# Patient Record
Sex: Female | Born: 1935 | Race: White | Hispanic: No | Smoking: Never smoker
Health system: Southern US, Community
[De-identification: ages and names within clinical notes are randomized; demographics above are authoritative.]

## PROBLEM LIST (undated history)

## (undated) DIAGNOSIS — F419 Anxiety disorder, unspecified: Secondary | ICD-10-CM

## (undated) DIAGNOSIS — I10 Essential (primary) hypertension: Secondary | ICD-10-CM

## (undated) DIAGNOSIS — M1990 Unspecified osteoarthritis, unspecified site: Secondary | ICD-10-CM

## (undated) DIAGNOSIS — J45909 Unspecified asthma, uncomplicated: Secondary | ICD-10-CM

## (undated) DIAGNOSIS — J449 Chronic obstructive pulmonary disease, unspecified: Secondary | ICD-10-CM

## (undated) DIAGNOSIS — K279 Peptic ulcer, site unspecified, unspecified as acute or chronic, without hemorrhage or perforation: Secondary | ICD-10-CM

## (undated) DIAGNOSIS — C73 Malignant neoplasm of thyroid gland: Secondary | ICD-10-CM

## (undated) DIAGNOSIS — E785 Hyperlipidemia, unspecified: Secondary | ICD-10-CM

## (undated) DIAGNOSIS — E7800 Pure hypercholesterolemia, unspecified: Secondary | ICD-10-CM

## (undated) DIAGNOSIS — K219 Gastro-esophageal reflux disease without esophagitis: Secondary | ICD-10-CM

## (undated) DIAGNOSIS — C50919 Malignant neoplasm of unspecified site of unspecified female breast: Secondary | ICD-10-CM

## (undated) DIAGNOSIS — C801 Malignant (primary) neoplasm, unspecified: Secondary | ICD-10-CM

## (undated) DIAGNOSIS — Z85828 Personal history of other malignant neoplasm of skin: Secondary | ICD-10-CM

## (undated) DIAGNOSIS — I7789 Other specified disorders of arteries and arterioles: Secondary | ICD-10-CM

## (undated) HISTORY — PX: MASTECTOMY: SHX3

## (undated) HISTORY — PX: STOMACH SURGERY: SHX791

## (undated) HISTORY — DX: Pure hypercholesterolemia, unspecified: E78.00

## (undated) HISTORY — PX: REPLACEMENT TOTAL KNEE BILATERAL: SUR1225

## (undated) HISTORY — DX: Unspecified osteoarthritis, unspecified site: M19.90

## (undated) HISTORY — PX: THYROIDECTOMY: SHX17

## (undated) HISTORY — PX: BREAST SURGERY: SHX581

## (undated) HISTORY — PX: JOINT REPLACEMENT: SHX530

## (undated) HISTORY — PX: APPENDECTOMY: SHX54

## (undated) HISTORY — PX: FRACTURE SURGERY: SHX138

## (undated) HISTORY — PX: CHOLECYSTECTOMY: SHX55

## (undated) HISTORY — DX: Malignant neoplasm of thyroid gland: C73

## (undated) HISTORY — PX: REPAIR OF PERFORATED ULCER: SHX6065

---

## 2007-11-16 DIAGNOSIS — E785 Hyperlipidemia, unspecified: Secondary | ICD-10-CM | POA: Insufficient documentation

## 2009-02-24 DIAGNOSIS — J45909 Unspecified asthma, uncomplicated: Secondary | ICD-10-CM | POA: Insufficient documentation

## 2009-02-24 DIAGNOSIS — J479 Bronchiectasis, uncomplicated: Secondary | ICD-10-CM | POA: Insufficient documentation

## 2013-02-03 DIAGNOSIS — N200 Calculus of kidney: Secondary | ICD-10-CM | POA: Insufficient documentation

## 2013-03-23 DIAGNOSIS — M1710 Unilateral primary osteoarthritis, unspecified knee: Secondary | ICD-10-CM | POA: Insufficient documentation

## 2013-04-01 DIAGNOSIS — E210 Primary hyperparathyroidism: Secondary | ICD-10-CM | POA: Insufficient documentation

## 2013-11-26 DIAGNOSIS — C73 Malignant neoplasm of thyroid gland: Secondary | ICD-10-CM | POA: Insufficient documentation

## 2013-12-15 DIAGNOSIS — E890 Postprocedural hypothyroidism: Secondary | ICD-10-CM | POA: Insufficient documentation

## 2015-01-20 DIAGNOSIS — G5601 Carpal tunnel syndrome, right upper limb: Secondary | ICD-10-CM | POA: Diagnosis not present

## 2015-01-20 DIAGNOSIS — G5602 Carpal tunnel syndrome, left upper limb: Secondary | ICD-10-CM | POA: Diagnosis not present

## 2015-01-25 DIAGNOSIS — E890 Postprocedural hypothyroidism: Secondary | ICD-10-CM | POA: Diagnosis not present

## 2015-01-25 DIAGNOSIS — C73 Malignant neoplasm of thyroid gland: Secondary | ICD-10-CM | POA: Diagnosis not present

## 2015-01-27 DIAGNOSIS — C73 Malignant neoplasm of thyroid gland: Secondary | ICD-10-CM | POA: Diagnosis not present

## 2015-01-27 DIAGNOSIS — E890 Postprocedural hypothyroidism: Secondary | ICD-10-CM | POA: Diagnosis not present

## 2015-02-10 DIAGNOSIS — G5602 Carpal tunnel syndrome, left upper limb: Secondary | ICD-10-CM | POA: Diagnosis not present

## 2015-04-25 DIAGNOSIS — E0789 Other specified disorders of thyroid: Secondary | ICD-10-CM | POA: Diagnosis not present

## 2015-04-25 DIAGNOSIS — E890 Postprocedural hypothyroidism: Secondary | ICD-10-CM | POA: Diagnosis not present

## 2015-04-25 DIAGNOSIS — C73 Malignant neoplasm of thyroid gland: Secondary | ICD-10-CM | POA: Diagnosis not present

## 2015-05-22 DIAGNOSIS — E890 Postprocedural hypothyroidism: Secondary | ICD-10-CM | POA: Diagnosis not present

## 2015-06-09 DIAGNOSIS — R6889 Other general symptoms and signs: Secondary | ICD-10-CM | POA: Diagnosis not present

## 2015-06-09 DIAGNOSIS — N1330 Unspecified hydronephrosis: Secondary | ICD-10-CM | POA: Diagnosis not present

## 2015-06-22 DIAGNOSIS — N1330 Unspecified hydronephrosis: Secondary | ICD-10-CM | POA: Diagnosis not present

## 2015-06-22 DIAGNOSIS — N281 Cyst of kidney, acquired: Secondary | ICD-10-CM | POA: Diagnosis not present

## 2015-06-22 DIAGNOSIS — R1909 Other intra-abdominal and pelvic swelling, mass and lump: Secondary | ICD-10-CM | POA: Diagnosis not present

## 2015-06-26 DIAGNOSIS — R6889 Other general symptoms and signs: Secondary | ICD-10-CM | POA: Diagnosis not present

## 2015-06-26 DIAGNOSIS — N1330 Unspecified hydronephrosis: Secondary | ICD-10-CM | POA: Diagnosis not present

## 2015-07-06 DIAGNOSIS — N200 Calculus of kidney: Secondary | ICD-10-CM | POA: Diagnosis not present

## 2015-07-14 DIAGNOSIS — Z23 Encounter for immunization: Secondary | ICD-10-CM | POA: Diagnosis not present

## 2015-07-31 DIAGNOSIS — C73 Malignant neoplasm of thyroid gland: Secondary | ICD-10-CM | POA: Diagnosis not present

## 2015-07-31 DIAGNOSIS — N200 Calculus of kidney: Secondary | ICD-10-CM | POA: Diagnosis not present

## 2015-07-31 DIAGNOSIS — J9601 Acute respiratory failure with hypoxia: Secondary | ICD-10-CM | POA: Diagnosis not present

## 2015-08-04 DIAGNOSIS — E890 Postprocedural hypothyroidism: Secondary | ICD-10-CM | POA: Diagnosis present

## 2015-08-04 DIAGNOSIS — R0602 Shortness of breath: Secondary | ICD-10-CM | POA: Diagnosis not present

## 2015-08-04 DIAGNOSIS — E860 Dehydration: Secondary | ICD-10-CM | POA: Diagnosis present

## 2015-08-04 DIAGNOSIS — Z7982 Long term (current) use of aspirin: Secondary | ICD-10-CM | POA: Diagnosis not present

## 2015-08-04 DIAGNOSIS — Z66 Do not resuscitate: Secondary | ICD-10-CM | POA: Diagnosis present

## 2015-08-04 DIAGNOSIS — J45909 Unspecified asthma, uncomplicated: Secondary | ICD-10-CM | POA: Diagnosis present

## 2015-08-04 DIAGNOSIS — A419 Sepsis, unspecified organism: Secondary | ICD-10-CM | POA: Diagnosis not present

## 2015-08-04 DIAGNOSIS — E871 Hypo-osmolality and hyponatremia: Secondary | ICD-10-CM | POA: Diagnosis not present

## 2015-08-04 DIAGNOSIS — F419 Anxiety disorder, unspecified: Secondary | ICD-10-CM | POA: Diagnosis present

## 2015-08-04 DIAGNOSIS — Z9010 Acquired absence of unspecified breast and nipple: Secondary | ICD-10-CM | POA: Diagnosis not present

## 2015-08-04 DIAGNOSIS — Z85850 Personal history of malignant neoplasm of thyroid: Secondary | ICD-10-CM | POA: Diagnosis not present

## 2015-08-04 DIAGNOSIS — Z853 Personal history of malignant neoplasm of breast: Secondary | ICD-10-CM | POA: Diagnosis not present

## 2015-08-04 DIAGNOSIS — J189 Pneumonia, unspecified organism: Secondary | ICD-10-CM | POA: Diagnosis not present

## 2015-08-04 DIAGNOSIS — I10 Essential (primary) hypertension: Secondary | ICD-10-CM | POA: Diagnosis not present

## 2015-08-04 DIAGNOSIS — J9601 Acute respiratory failure with hypoxia: Secondary | ICD-10-CM | POA: Diagnosis not present

## 2015-08-14 DIAGNOSIS — J189 Pneumonia, unspecified organism: Secondary | ICD-10-CM | POA: Diagnosis not present

## 2015-08-28 DIAGNOSIS — Z09 Encounter for follow-up examination after completed treatment for conditions other than malignant neoplasm: Secondary | ICD-10-CM | POA: Diagnosis not present

## 2015-08-28 DIAGNOSIS — I700 Atherosclerosis of aorta: Secondary | ICD-10-CM | POA: Diagnosis not present

## 2015-08-28 DIAGNOSIS — R918 Other nonspecific abnormal finding of lung field: Secondary | ICD-10-CM | POA: Diagnosis not present

## 2015-08-28 DIAGNOSIS — J189 Pneumonia, unspecified organism: Secondary | ICD-10-CM | POA: Diagnosis not present

## 2015-10-24 DIAGNOSIS — F411 Generalized anxiety disorder: Secondary | ICD-10-CM | POA: Insufficient documentation

## 2016-02-26 DIAGNOSIS — E890 Postprocedural hypothyroidism: Secondary | ICD-10-CM | POA: Diagnosis not present

## 2016-03-13 DIAGNOSIS — Z08 Encounter for follow-up examination after completed treatment for malignant neoplasm: Secondary | ICD-10-CM | POA: Diagnosis not present

## 2016-03-13 DIAGNOSIS — Z85850 Personal history of malignant neoplasm of thyroid: Secondary | ICD-10-CM | POA: Diagnosis not present

## 2016-03-15 DIAGNOSIS — Z85850 Personal history of malignant neoplasm of thyroid: Secondary | ICD-10-CM | POA: Diagnosis not present

## 2016-03-15 DIAGNOSIS — Z08 Encounter for follow-up examination after completed treatment for malignant neoplasm: Secondary | ICD-10-CM | POA: Diagnosis not present

## 2016-05-14 DIAGNOSIS — R6889 Other general symptoms and signs: Secondary | ICD-10-CM | POA: Diagnosis not present

## 2016-05-14 DIAGNOSIS — E785 Hyperlipidemia, unspecified: Secondary | ICD-10-CM | POA: Diagnosis not present

## 2016-05-15 DIAGNOSIS — J189 Pneumonia, unspecified organism: Secondary | ICD-10-CM | POA: Diagnosis not present

## 2016-05-15 DIAGNOSIS — I77810 Thoracic aortic ectasia: Secondary | ICD-10-CM | POA: Insufficient documentation

## 2016-05-17 DIAGNOSIS — J189 Pneumonia, unspecified organism: Secondary | ICD-10-CM | POA: Diagnosis not present

## 2016-05-20 DIAGNOSIS — J189 Pneumonia, unspecified organism: Secondary | ICD-10-CM | POA: Diagnosis not present

## 2016-05-24 DIAGNOSIS — J189 Pneumonia, unspecified organism: Secondary | ICD-10-CM | POA: Diagnosis not present

## 2016-06-04 DIAGNOSIS — M7989 Other specified soft tissue disorders: Secondary | ICD-10-CM | POA: Insufficient documentation

## 2016-06-04 DIAGNOSIS — R2230 Localized swelling, mass and lump, unspecified upper limb: Secondary | ICD-10-CM | POA: Insufficient documentation

## 2016-06-17 DIAGNOSIS — I4891 Unspecified atrial fibrillation: Secondary | ICD-10-CM | POA: Diagnosis not present

## 2016-06-17 DIAGNOSIS — I340 Nonrheumatic mitral (valve) insufficiency: Secondary | ICD-10-CM | POA: Diagnosis not present

## 2016-06-17 DIAGNOSIS — I714 Abdominal aortic aneurysm, without rupture: Secondary | ICD-10-CM | POA: Diagnosis not present

## 2016-06-17 DIAGNOSIS — R000 Tachycardia, unspecified: Secondary | ICD-10-CM | POA: Diagnosis not present

## 2016-07-01 DIAGNOSIS — J479 Bronchiectasis, uncomplicated: Secondary | ICD-10-CM | POA: Diagnosis not present

## 2016-07-01 DIAGNOSIS — M47819 Spondylosis without myelopathy or radiculopathy, site unspecified: Secondary | ICD-10-CM | POA: Diagnosis not present

## 2016-07-01 DIAGNOSIS — J4530 Mild persistent asthma, uncomplicated: Secondary | ICD-10-CM | POA: Diagnosis not present

## 2016-07-01 DIAGNOSIS — J189 Pneumonia, unspecified organism: Secondary | ICD-10-CM | POA: Diagnosis not present

## 2016-07-01 DIAGNOSIS — R05 Cough: Secondary | ICD-10-CM | POA: Diagnosis not present

## 2016-07-01 DIAGNOSIS — M19011 Primary osteoarthritis, right shoulder: Secondary | ICD-10-CM | POA: Diagnosis not present

## 2016-07-01 DIAGNOSIS — I77810 Thoracic aortic ectasia: Secondary | ICD-10-CM | POA: Diagnosis not present

## 2016-07-01 DIAGNOSIS — J9811 Atelectasis: Secondary | ICD-10-CM | POA: Diagnosis not present

## 2016-07-01 DIAGNOSIS — R911 Solitary pulmonary nodule: Secondary | ICD-10-CM | POA: Diagnosis not present

## 2016-07-01 DIAGNOSIS — M19012 Primary osteoarthritis, left shoulder: Secondary | ICD-10-CM | POA: Diagnosis not present

## 2016-07-01 DIAGNOSIS — R918 Other nonspecific abnormal finding of lung field: Secondary | ICD-10-CM | POA: Diagnosis not present

## 2016-07-02 DIAGNOSIS — R319 Hematuria, unspecified: Secondary | ICD-10-CM | POA: Insufficient documentation

## 2016-07-02 DIAGNOSIS — I10 Essential (primary) hypertension: Secondary | ICD-10-CM | POA: Diagnosis not present

## 2016-07-02 DIAGNOSIS — R350 Frequency of micturition: Secondary | ICD-10-CM | POA: Diagnosis not present

## 2016-07-15 DIAGNOSIS — M7989 Other specified soft tissue disorders: Secondary | ICD-10-CM | POA: Diagnosis not present

## 2016-07-22 DIAGNOSIS — M65842 Other synovitis and tenosynovitis, left hand: Secondary | ICD-10-CM | POA: Diagnosis not present

## 2016-08-09 DIAGNOSIS — Z23 Encounter for immunization: Secondary | ICD-10-CM | POA: Diagnosis not present

## 2016-08-20 DIAGNOSIS — C73 Malignant neoplasm of thyroid gland: Secondary | ICD-10-CM | POA: Diagnosis not present

## 2016-08-20 DIAGNOSIS — E890 Postprocedural hypothyroidism: Secondary | ICD-10-CM | POA: Diagnosis not present

## 2016-08-28 DIAGNOSIS — C7989 Secondary malignant neoplasm of other specified sites: Secondary | ICD-10-CM | POA: Insufficient documentation

## 2016-08-29 DIAGNOSIS — C770 Secondary and unspecified malignant neoplasm of lymph nodes of head, face and neck: Secondary | ICD-10-CM | POA: Diagnosis not present

## 2016-08-29 DIAGNOSIS — C73 Malignant neoplasm of thyroid gland: Secondary | ICD-10-CM | POA: Diagnosis not present

## 2016-08-29 DIAGNOSIS — E890 Postprocedural hypothyroidism: Secondary | ICD-10-CM | POA: Diagnosis not present

## 2016-08-29 DIAGNOSIS — E210 Primary hyperparathyroidism: Secondary | ICD-10-CM | POA: Diagnosis not present

## 2016-09-23 DIAGNOSIS — R6889 Other general symptoms and signs: Secondary | ICD-10-CM | POA: Diagnosis not present

## 2016-09-24 DIAGNOSIS — M810 Age-related osteoporosis without current pathological fracture: Secondary | ICD-10-CM | POA: Insufficient documentation

## 2016-10-07 DIAGNOSIS — M47814 Spondylosis without myelopathy or radiculopathy, thoracic region: Secondary | ICD-10-CM | POA: Diagnosis not present

## 2016-10-07 DIAGNOSIS — R911 Solitary pulmonary nodule: Secondary | ICD-10-CM | POA: Diagnosis not present

## 2016-10-07 DIAGNOSIS — I259 Chronic ischemic heart disease, unspecified: Secondary | ICD-10-CM | POA: Diagnosis not present

## 2016-10-07 DIAGNOSIS — I517 Cardiomegaly: Secondary | ICD-10-CM | POA: Diagnosis not present

## 2016-10-07 DIAGNOSIS — J189 Pneumonia, unspecified organism: Secondary | ICD-10-CM | POA: Diagnosis not present

## 2016-10-07 DIAGNOSIS — J479 Bronchiectasis, uncomplicated: Secondary | ICD-10-CM | POA: Diagnosis not present

## 2016-10-07 DIAGNOSIS — R918 Other nonspecific abnormal finding of lung field: Secondary | ICD-10-CM | POA: Diagnosis not present

## 2016-10-07 DIAGNOSIS — I712 Thoracic aortic aneurysm, without rupture: Secondary | ICD-10-CM | POA: Diagnosis not present

## 2016-10-07 DIAGNOSIS — N200 Calculus of kidney: Secondary | ICD-10-CM | POA: Diagnosis not present

## 2016-10-11 DIAGNOSIS — J479 Bronchiectasis, uncomplicated: Secondary | ICD-10-CM | POA: Diagnosis not present

## 2016-10-11 DIAGNOSIS — J4530 Mild persistent asthma, uncomplicated: Secondary | ICD-10-CM | POA: Diagnosis not present

## 2016-10-11 DIAGNOSIS — R918 Other nonspecific abnormal finding of lung field: Secondary | ICD-10-CM | POA: Diagnosis not present

## 2016-11-05 LAB — TSH: TSH: 0.5 u[IU]/mL (ref <=5.90)

## 2016-11-06 DIAGNOSIS — C73 Malignant neoplasm of thyroid gland: Secondary | ICD-10-CM | POA: Diagnosis not present

## 2016-11-11 DIAGNOSIS — R300 Dysuria: Secondary | ICD-10-CM | POA: Diagnosis not present

## 2016-11-22 ENCOUNTER — Emergency Department: Payer: Medicare Other

## 2016-11-22 ENCOUNTER — Inpatient Hospital Stay
Admission: EM | Admit: 2016-11-22 | Discharge: 2016-11-24 | DRG: 690 | Disposition: A | Discharge status: Home Health | Payer: Medicare Other | Attending: Internal Medicine | Admitting: Internal Medicine

## 2016-11-22 ENCOUNTER — Encounter: Payer: Self-pay | Admitting: Emergency Medicine

## 2016-11-22 DIAGNOSIS — E860 Dehydration: Secondary | ICD-10-CM | POA: Diagnosis present

## 2016-11-22 DIAGNOSIS — R0602 Shortness of breath: Secondary | ICD-10-CM | POA: Diagnosis not present

## 2016-11-22 DIAGNOSIS — J449 Chronic obstructive pulmonary disease, unspecified: Secondary | ICD-10-CM | POA: Diagnosis present

## 2016-11-22 DIAGNOSIS — I10 Essential (primary) hypertension: Secondary | ICD-10-CM | POA: Diagnosis present

## 2016-11-22 DIAGNOSIS — B9620 Unspecified Escherichia coli [E. coli] as the cause of diseases classified elsewhere: Secondary | ICD-10-CM | POA: Diagnosis present

## 2016-11-22 DIAGNOSIS — R262 Difficulty in walking, not elsewhere classified: Secondary | ICD-10-CM

## 2016-11-22 DIAGNOSIS — Z8249 Family history of ischemic heart disease and other diseases of the circulatory system: Secondary | ICD-10-CM | POA: Diagnosis not present

## 2016-11-22 DIAGNOSIS — E785 Hyperlipidemia, unspecified: Secondary | ICD-10-CM | POA: Diagnosis present

## 2016-11-22 DIAGNOSIS — E890 Postprocedural hypothyroidism: Secondary | ICD-10-CM | POA: Diagnosis present

## 2016-11-22 DIAGNOSIS — E871 Hypo-osmolality and hyponatremia: Secondary | ICD-10-CM | POA: Diagnosis not present

## 2016-11-22 DIAGNOSIS — J181 Lobar pneumonia, unspecified organism: Secondary | ICD-10-CM

## 2016-11-22 DIAGNOSIS — J189 Pneumonia, unspecified organism: Secondary | ICD-10-CM

## 2016-11-22 DIAGNOSIS — R319 Hematuria, unspecified: Secondary | ICD-10-CM

## 2016-11-22 DIAGNOSIS — M6281 Muscle weakness (generalized): Secondary | ICD-10-CM

## 2016-11-22 DIAGNOSIS — N390 Urinary tract infection, site not specified: Principal | ICD-10-CM | POA: Diagnosis present

## 2016-11-22 DIAGNOSIS — R6510 Systemic inflammatory response syndrome (SIRS) of non-infectious origin without acute organ dysfunction: Secondary | ICD-10-CM

## 2016-11-22 DIAGNOSIS — Z85828 Personal history of other malignant neoplasm of skin: Secondary | ICD-10-CM

## 2016-11-22 DIAGNOSIS — Z853 Personal history of malignant neoplasm of breast: Secondary | ICD-10-CM | POA: Diagnosis not present

## 2016-11-22 DIAGNOSIS — R531 Weakness: Secondary | ICD-10-CM | POA: Diagnosis not present

## 2016-11-22 HISTORY — DX: Peptic ulcer, site unspecified, unspecified as acute or chronic, without hemorrhage or perforation: K27.9

## 2016-11-22 HISTORY — DX: Malignant neoplasm of unspecified site of unspecified female breast: C50.919

## 2016-11-22 HISTORY — DX: Anxiety disorder, unspecified: F41.9

## 2016-11-22 HISTORY — DX: Malignant (primary) neoplasm, unspecified: C80.1

## 2016-11-22 HISTORY — DX: Essential (primary) hypertension: I10

## 2016-11-22 HISTORY — DX: Hyperlipidemia, unspecified: E78.5

## 2016-11-22 HISTORY — DX: Chronic obstructive pulmonary disease, unspecified: J44.9

## 2016-11-22 HISTORY — DX: Other specified disorders of arteries and arterioles: I77.89

## 2016-11-22 HISTORY — DX: Unspecified asthma, uncomplicated: J45.909

## 2016-11-22 HISTORY — DX: Gastro-esophageal reflux disease without esophagitis: K21.9

## 2016-11-22 HISTORY — DX: Personal history of other malignant neoplasm of skin: Z85.828

## 2016-11-22 LAB — CBC
HCT: 38.3 % (ref 35.0–47.0)
Hemoglobin: 13.1 g/dL (ref 12.0–16.0)
MCH: 29.1 pg (ref 26.0–34.0)
MCHC: 34.2 g/dL (ref 32.0–36.0)
MCV: 85.2 fL (ref 80.0–100.0)
PLATELETS: 218 10*3/uL (ref 150–440)
RBC: 4.49 MIL/uL (ref 3.80–5.20)
RDW: 13.6 % (ref 11.5–14.5)
WBC: 11.4 10*3/uL — ABNORMAL HIGH (ref 3.6–11.0)

## 2016-11-22 LAB — BASIC METABOLIC PANEL
Anion gap: 11 (ref 5–15)
BUN: 12 mg/dL (ref 6–20)
CHLORIDE: 82 mmol/L — AB (ref 101–111)
CO2: 26 mmol/L (ref 22–32)
CREATININE: 0.45 mg/dL (ref 0.44–1.00)
Calcium: 8.4 mg/dL — ABNORMAL LOW (ref 8.9–10.3)
GFR calc Af Amer: >60 mL/min (ref >60)
GFR calc non Af Amer: >60 mL/min (ref >60)
GLUCOSE: 99 mg/dL (ref 65–99)
Potassium: 4.7 mmol/L (ref 3.5–5.1)
Sodium: 119 mmol/L — CL (ref 135–145)

## 2016-11-22 LAB — URINALYSIS, COMPLETE (UACMP) WITH MICROSCOPIC
BILIRUBIN URINE: NEGATIVE
Glucose, UA: NEGATIVE mg/dL
Ketones, ur: NEGATIVE mg/dL
Nitrite: POSITIVE — AB
PROTEIN: 100 mg/dL — AB
SPECIFIC GRAVITY, URINE: 1.012 (ref 1.005–1.030)
pH: 8 (ref 5.0–8.0)

## 2016-11-22 LAB — INFLUENZA PANEL BY PCR (TYPE A & B)
Influenza A By PCR: NEGATIVE
Influenza B By PCR: NEGATIVE

## 2016-11-22 LAB — LACTIC ACID, PLASMA
LACTIC ACID, VENOUS: 1.1 mmol/L (ref 0.5–1.9)
LACTIC ACID, VENOUS: 1.1 mmol/L (ref 0.5–1.9)

## 2016-11-22 MED ORDER — LEVOTHYROXINE SODIUM 50 MCG PO TABS
150.0000 ug | ORAL_TABLET | Freq: Every day | ORAL | Status: DC
  Administered 2016-11-23 – 2016-11-24 (×2): 150 ug via ORAL
  Filled 2016-11-22 (×2): qty 3

## 2016-11-22 MED ORDER — DEXTROSE 5 % IV SOLN: 1.0000 g | INTRAVENOUS | Status: DC

## 2016-11-22 MED ORDER — SODIUM CHLORIDE 0.9 % IV SOLN
INTRAVENOUS | Status: DC
  Administered 2016-11-22 – 2016-11-24 (×4): via INTRAVENOUS

## 2016-11-22 MED ORDER — ENOXAPARIN SODIUM 40 MG/0.4ML ~~LOC~~ SOLN
40.0000 mg | SUBCUTANEOUS | Status: DC
  Administered 2016-11-22 – 2016-11-23 (×2): 40 mg via SUBCUTANEOUS
  Filled 2016-11-22 (×2): qty 0.4

## 2016-11-22 MED ORDER — ENALAPRIL MALEATE 20 MG PO TABS
20.0000 mg | ORAL_TABLET | Freq: Every day | ORAL | Status: DC
  Administered 2016-11-23 – 2016-11-24 (×2): 20 mg via ORAL
  Filled 2016-11-22 (×2): qty 1

## 2016-11-22 MED ORDER — ASPIRIN EC 81 MG PO TBEC
81.0000 mg | DELAYED_RELEASE_TABLET | Freq: Every day | ORAL | Status: DC
  Administered 2016-11-22 – 2016-11-24 (×3): 81 mg via ORAL
  Filled 2016-11-22 (×4): qty 1

## 2016-11-22 MED ORDER — SERTRALINE HCL 50 MG PO TABS
50.0000 mg | ORAL_TABLET | Freq: Every day | ORAL | Status: DC
  Administered 2016-11-23 – 2016-11-24 (×2): 50 mg via ORAL
  Filled 2016-11-22 (×2): qty 1

## 2016-11-22 MED ORDER — ONDANSETRON HCL 4 MG PO TABS: 4.0000 mg | ORAL_TABLET | Freq: Four times a day (QID) | ORAL | Status: DC | PRN

## 2016-11-22 MED ORDER — LORAZEPAM 0.5 MG PO TABS
0.2500 mg | ORAL_TABLET | ORAL | Status: DC | PRN
  Administered 2016-11-22 – 2016-11-23 (×4): 0.25 mg via ORAL
  Filled 2016-11-22 (×4): qty 1

## 2016-11-22 MED ORDER — CEFTRIAXONE SODIUM-DEXTROSE 1-3.74 GM-% IV SOLR
1.0000 g | INTRAVENOUS | Status: DC
  Administered 2016-11-23: 1 g via INTRAVENOUS
  Filled 2016-11-22: qty 50

## 2016-11-22 MED ORDER — OXYBUTYNIN CHLORIDE 5 MG PO TABS
5.0000 mg | ORAL_TABLET | Freq: Three times a day (TID) | ORAL | Status: DC
  Filled 2016-11-22 (×3): qty 1

## 2016-11-22 MED ORDER — SODIUM CHLORIDE 0.9 % IV BOLUS (SEPSIS)
1000.0000 mL | Freq: Once | INTRAVENOUS | Status: AC
  Administered 2016-11-22: 1000 mL via INTRAVENOUS

## 2016-11-22 MED ORDER — CEFTRIAXONE SODIUM-DEXTROSE 1-3.74 GM-% IV SOLR
1.0000 g | Freq: Once | INTRAVENOUS | Status: AC
  Administered 2016-11-22: 1 g via INTRAVENOUS
  Filled 2016-11-22: qty 50

## 2016-11-22 MED ORDER — ACETAMINOPHEN 650 MG RE SUPP: 650.0000 mg | Freq: Four times a day (QID) | RECTAL | Status: DC | PRN

## 2016-11-22 MED ORDER — DEXTROSE 5 % IV SOLN: 1.0000 g | Freq: Once | INTRAVENOUS | Status: DC

## 2016-11-22 MED ORDER — ACETAMINOPHEN 325 MG PO TABS
650.0000 mg | ORAL_TABLET | Freq: Four times a day (QID) | ORAL | Status: DC | PRN
  Administered 2016-11-22: 650 mg via ORAL

## 2016-11-22 MED ORDER — GUAIFENESIN ER 600 MG PO TB12
600.0000 mg | ORAL_TABLET | Freq: Two times a day (BID) | ORAL | Status: DC
  Administered 2016-11-22 – 2016-11-24 (×4): 600 mg via ORAL
  Filled 2016-11-22 (×4): qty 1

## 2016-11-22 MED ORDER — RISAQUAD PO CAPS
1.0000 | ORAL_CAPSULE | Freq: Every day | ORAL | Status: DC
  Administered 2016-11-23 – 2016-11-24 (×2): 1 via ORAL
  Filled 2016-11-22 (×2): qty 1

## 2016-11-22 MED ORDER — PRAVASTATIN SODIUM 10 MG PO TABS
10.0000 mg | ORAL_TABLET | Freq: Every day | ORAL | Status: DC
  Administered 2016-11-22 – 2016-11-23 (×2): 10 mg via ORAL
  Filled 2016-11-22 (×2): qty 1

## 2016-11-22 MED ORDER — ACETAMINOPHEN 325 MG PO TABS
650.0000 mg | ORAL_TABLET | Freq: Four times a day (QID) | ORAL | Status: DC | PRN
  Filled 2016-11-22: qty 2

## 2016-11-22 MED ORDER — AZITHROMYCIN 500 MG IV SOLR
500.0000 mg | INTRAVENOUS | Status: DC
  Filled 2016-11-22: qty 500

## 2016-11-22 MED ORDER — CALCIUM CARBONATE ANTACID 500 MG PO CHEW
1.0000 | CHEWABLE_TABLET | Freq: Every day | ORAL | Status: DC
  Administered 2016-11-22 – 2016-11-24 (×3): 200 mg via ORAL
  Filled 2016-11-22 (×4): qty 1

## 2016-11-22 MED ORDER — DEXTROSE 5 % IV SOLN
500.0000 mg | Freq: Once | INTRAVENOUS | Status: AC
  Administered 2016-11-22: 500 mg via INTRAVENOUS
  Filled 2016-11-22: qty 500

## 2016-11-22 MED ORDER — VITAMIN D 1000 UNITS PO TABS
2000.0000 [IU] | ORAL_TABLET | Freq: Every day | ORAL | Status: DC
  Administered 2016-11-22 – 2016-11-23 (×2): 2000 [IU] via ORAL
  Filled 2016-11-22 (×2): qty 2

## 2016-11-22 MED ORDER — ONDANSETRON HCL 4 MG/2ML IJ SOLN: 4.0000 mg | Freq: Four times a day (QID) | INTRAMUSCULAR | Status: DC | PRN

## 2016-11-22 MED ORDER — ADULT MULTIVITAMIN W/MINERALS CH
1.0000 | ORAL_TABLET | Freq: Every day | ORAL | Status: DC
  Administered 2016-11-23 – 2016-11-24 (×2): 1 via ORAL
  Filled 2016-11-22 (×2): qty 1

## 2016-11-22 MED ORDER — MOMETASONE FURO-FORMOTEROL FUM 200-5 MCG/ACT IN AERO
2.0000 | INHALATION_SPRAY | Freq: Two times a day (BID) | RESPIRATORY_TRACT | Status: DC
  Administered 2016-11-22 – 2016-11-24 (×4): 2 via RESPIRATORY_TRACT
  Filled 2016-11-22: qty 8.8

## 2016-11-22 NOTE — ED Triage Notes (Signed)
Weak, not eating or drinking for about 3 days.  Just came from [place] several days ago.  Patient appears sob. Says she has had a cough, and hx bronchitis.

## 2016-11-22 NOTE — Plan of Care (Signed)
Received via stretcher. Transferred to bed without complications. VSS. Denies pain. NS infusing at 72ml/hr. Family at bedside.

## 2016-11-22 NOTE — ED Provider Notes (Signed)
[facility] Emergency Department Provider Note ____________________________________________   I have reviewed the triage vital signs and the triage nursing note.  HISTORY  Chief Complaint Weakness   Historian Patient and her daughter  HPI Joyce [name] is a 83 y.o. female history of COPD, from [place] who came in on Sunday to visit her daughter here in [place]. Since she woke up on Monday morning she has had a lot of generalized weakness. No confusion or altered mental status. No fever. Baseline shortness of breath, but nothing worse than normal and she has a history of COPD. States that she's had echocardiogram and was told they would just repeated in about a year. No diagnosis of CHF and does not think her legs are swollen.  States that she recently completed antibiotic treatment for urinary tract infection but does not know what antibiotic it was.  She states that she has been told that she had low sodium in the past and was seen in the emergency room, but never admitted for it.  No chest pain.    Past Medical History:  Diagnosis Date  . Asthma   . Cancer ([place])   . COPD (chronic obstructive pulmonary disease) ([place])   . Enlarged aorta ([place])   . Hypertension     There are no active problems to display for this patient.   Past Surgical History:  Procedure Laterality Date  . APPENDECTOMY    . BREAST SURGERY    . CHOLECYSTECTOMY    . FRACTURE SURGERY    . JOINT REPLACEMENT    . MASTECTOMY    . STOMACH SURGERY    . THYROIDECTOMY      Prior to Admission medications   Not on File    No Known Allergies  No family history on file.  Social History Social History  Substance Use Topics  . Smoking status: Never Smoker  . Smokeless tobacco: Never Used  . Alcohol use No    Review of Systems  Constitutional: Negative for fever. Eyes: Negative for visual changes. ENT: Negative for sore throat. Cardiovascular: Negative for chest  pain. Respiratory: Chronic shortness breath without any new shortness of breath or wheezing or trouble breathing. Gastrointestinal: Negative for abdominal pain, vomiting and diarrhea. Genitourinary: Negative for dysuria. Musculoskeletal: Negative for back pain. Skin: Negative for rash. Neurological: Negative for headache. 10 point Review of Systems otherwise negative ____________________________________________   PHYSICAL EXAM:  VITAL SIGNS: ED Triage Vitals [11/22/16 1051]  Enc Vitals Group     BP 129/74     Pulse Rate (!) 104     Resp (!) 21     Temp 97.7 F (36.5 C)     Temp Source Oral     SpO2 91 %     Weight 135 lb (61.2 kg)     Height 5' (1.524 m)     Head Circumference      Peak Flow      Pain Score      Pain Loc      Pain Edu?      Excl. in [place]?      Constitutional: Alert and oriented. In no acute distress. HEENT   Head: Normocephalic and atraumatic.      Eyes: Conjunctivae are normal. PERRL. Normal extraocular movements.      Ears:         Nose: No congestion/rhinnorhea.   Mouth/Throat: Mucous membranes are moderately dry.   Neck: No stridor. Cardiovascular/Chest: Slightly tachycardic, regular rhythm.  No  murmurs, rubs, or gallops. Respiratory: Normal respiratory effort without tachypnea nor retractions. Mild rhonchi symmetrically, left base. Gastrointestinal: Soft. No distention, no guarding, no rebound. Nontender.   Genitourinary/rectal:Deferred Musculoskeletal: Nontender with normal range of motion in all extremities. No joint effusions.  No lower extremity tenderness. 1-2+ lower extremity pitting edema bilaterally. Neurologic:  Normal speech and language. No gross or focal neurologic deficits are appreciated. Skin:  Skin is warm, dry and intact. No rash noted. Psychiatric: Mood and affect are normal. Speech and behavior are normal. Patient exhibits appropriate insight and judgment.   ____________________________________________  LABS  (pertinent positives/negatives)  Labs Reviewed  BASIC METABOLIC PANEL - Abnormal; Notable for the following:       Result Value   Sodium 119 (*)    Chloride 82 (*)    Calcium 8.4 (*)    All other components within normal limits  CBC - Abnormal; Notable for the following:    WBC 11.4 (*)    All other components within normal limits  URINALYSIS, COMPLETE (UACMP) WITH MICROSCOPIC - Abnormal; Notable for the following:    Color, Urine YELLOW (*)    APPearance CLOUDY (*)    Hgb urine dipstick MODERATE (*)    Protein, ur 100 (*)    Nitrite POSITIVE (*)    Leukocytes, UA LARGE (*)    Bacteria, UA MANY (*)    Squamous Epithelial / LPF 0-5 (*)    All other components within normal limits  CULTURE, BLOOD (ROUTINE X 2)  CULTURE, BLOOD (ROUTINE X 2)  URINE CULTURE  LACTIC ACID, PLASMA  LACTIC ACID, PLASMA  CBG MONITORING, ED    ____________________________________________    EKG I, Lisa Roca, MD, the attending physician have personally viewed and interpreted all ECGs.  102 bpm. Sinus tachycardia. Narrow QRS. Normal axis. Nonspecific ST and T-wave ____________________________________________  RADIOLOGY All Xrays were viewed by me. Imaging interpreted by Radiologist.  Chest portable:  IMPRESSION: 1. Left lower lobe infiltrate. 2. Aortic atherosclerosis. __________________________________________  PROCEDURES  Procedure(s) performed: None  Critical Care performed: None  ____________________________________________   ED COURSE / ASSESSMENT AND PLAN  Pertinent labs & imaging results that were available during my care of the patient were reviewed by me and considered in my medical decision making (see chart for details).   I spoke with the triage nurse regarding critical sodium 119, and I reviewed laboratory studies and saw also nitrite positive UTI associated with tachycardia. I placed sepsis protocols. I added blood work for blood cultures, urine culture, and lactate.  I initiated antibiotics Rocephin for urinary tract infection.  She does not know what she was recently on. Added on chest x-ray due to the low oxygen at 91%, and rhonchi at the left base, chest x-ray read as left lower lobe infiltrate. Added azithromycin.  Patient will need to be admitted for hyponatremia, SIRS and UTI, also mild hypoxia - unclear if this is worse from baseline or not, but treating for left lower lobe pneumonia.  In terms of the hyponatremia, sound like she has had this in the past, but she doesn't know what number. She doesn't think she's ever been diagnosed with congestive heart failure, however she does have some 2+ lower extremity pain or edema bilateral lower extremities.  I'm starting initially with fluid bolus for her severe hyponatremia, but and watching carefully due to concern about possible CHF.    CONSULTATIONS:   Hospitalist for admission.   Patient / Family / Caregiver informed of clinical course, medical decision-making process,  and agree with plan.  ___________________________________________   FINAL CLINICAL IMPRESSION(S) / ED DIAGNOSES   Final diagnoses:  Hyponatremia  SIRS (systemic inflammatory response syndrome) (HCC)  Urinary tract infection with hematuria, site unspecified  Pneumonia of left lower lobe due to infectious organism [facility])              Note: This dictation was prepared with Dragon dictation. Any transcriptional errors that result from this process are unintentional    Lisa Roca, MD 11/22/16 1312

## 2016-11-22 NOTE — H&P (Signed)
[facility] [place] NAME: Joyce [name]    MR#:  [id number]  DATE OF BIRTH:  30-Mar-1928  DATE OF ADMISSION:  11/22/2016  PRIMARY CARE PHYSICIAN: No primary care provider on file.   REQUESTING/REFERRING PHYSICIAN: Lisa Roca MD  CHIEF COMPLAINT:   Chief Complaint  Patient presents with  . Weakness    HISTORY OF PRESENT ILLNESS: Latesha Keel  is a 83 y.o. female with a known history of Anxiety asthma, history of breast cancer, COPD, essential hypertension and hyperlipidemia who was recently treated with antibiotics for urinary tract infection is presenting with generalized weakness and deconditioning. Patient was evaluated in the ED is noted to have hyponatremia is noted to have a urinary tract infection as well as pneumonia. Patient's reports that she just has not felt like wanted to eat for the past 1 week. Has loss of appetite but no nausea vomiting or diarrhea. Denies any fevers chills no body aches    PAST MEDICAL HISTORY:   Past Medical History:  Diagnosis Date  . Anxiety   . Asthma   . Breast cancer ([place])   . Cancer ([place])   . COPD (chronic obstructive pulmonary disease) ([place])   . Enlarged aorta ([place])   . GERD (gastroesophageal reflux disease)   . Hx of skin cancer, basal cell   . Hyperlipemia   . Hypertension   . PUD (peptic ulcer disease)     PAST SURGICAL HISTORY: Past Surgical History:  Procedure Laterality Date  . APPENDECTOMY    . BREAST SURGERY    . CHOLECYSTECTOMY    . FRACTURE SURGERY    . JOINT REPLACEMENT    . MASTECTOMY    . STOMACH SURGERY    . THYROIDECTOMY      SOCIAL HISTORY:  Social History  Substance Use Topics  . Smoking status: Never Smoker  . Smokeless tobacco: Never Used  . Alcohol use No    FAMILY HISTORY:  Family History  Problem Relation Age of Onset  . Hypertension Father     DRUG ALLERGIES: No Known Allergies  REVIEW OF SYSTEMS:   CONSTITUTIONAL: No fever, Positive fatigue  and  weakness.  EYES: No blurred or double vision.  EARS, NOSE, AND THROAT: No tinnitus or ear pain.  RESPIRATORY: No cough, shortness of breath, wheezing or hemoptysis.  CARDIOVASCULAR: No chest pain, orthopnea, edema.  GASTROINTESTINAL: No nausea, vomiting, diarrhea or abdominal pain.  GENITOURINARY: No dysuria, hematuria.  ENDOCRINE: No polyuria, nocturia,  HEMATOLOGY: No anemia, easy bruising or bleeding SKIN: No rash or lesion. MUSCULOSKELETAL: No joint pain or arthritis.   NEUROLOGIC: No tingling, numbness, weakness.  PSYCHIATRY: No anxiety or depression.   MEDICATIONS AT HOME:  Prior to Admission medications   Medication Sig Start Date End Date Taking? Authorizing Provider  acetaminophen (TYLENOL) 325 MG tablet Take 650 mg by mouth every 6 (six) hours as needed.   Yes Historical Provider, MD  acidophilus (RISAQUAD) CAPS capsule Take 1 capsule by mouth daily.   Yes Historical Provider, MD  aspirin EC 81 MG tablet Take 81 mg by mouth daily.   Yes Historical Provider, MD  calcium carbonate (TUMS - DOSED IN MG ELEMENTAL CALCIUM) 500 MG chewable tablet Chew 1 tablet by mouth daily.   Yes Historical Provider, MD  cholecalciferol (VITAMIN D) 1000 units tablet Take 2,000 Units by mouth daily.   Yes Historical Provider, MD  enalapril (VASOTEC) 20 MG tablet Take 20 mg by mouth daily.   Yes Historical  Provider, MD  Fluticasone-Salmeterol (ADVAIR) 500-50 MCG/DOSE AEPB Inhale 1 puff into the lungs 2 (two) times daily.   Yes Historical Provider, MD  levothyroxine (SYNTHROID, LEVOTHROID) 150 MCG tablet Take 150 mcg by mouth daily before breakfast.   Yes Historical Provider, MD  LORazepam (ATIVAN) 0.5 MG tablet Take 0.25 mg by mouth every 4 (four) hours as needed for anxiety.   Yes Historical Provider, MD  lovastatin (MEVACOR) 40 MG tablet Take 40 mg by mouth at bedtime.   Yes Historical Provider, MD  MAGNESIUM-ZINC PO Take 1 tablet by mouth daily.   Yes Historical Provider, MD  Multiple Vitamin  (MULTIVITAMIN WITH MINERALS) TABS tablet Take 1 tablet by mouth daily.   Yes Historical Provider, MD  oxybutynin (DITROPAN) 5 MG tablet Take 5 mg by mouth 3 (three) times daily.   Yes Historical Provider, MD  sertraline (ZOLOFT) 50 MG tablet Take 50 mg by mouth daily.   Yes Historical Provider, MD      PHYSICAL EXAMINATION:   VITAL SIGNS: Blood pressure 129/74, pulse (!) 104, temperature 97.7 F (36.5 C), temperature source Oral, resp. rate (!) 21, height 5' (1.524 m), weight 135 lb (61.2 kg), SpO2 91 %.  GENERAL:  83 y.o.-year-old patient lying in the bed with no acute distress. Appears weak EYES: Pupils equal, round, reactive to light and accommodation. No scleral icterus. Extraocular muscles intact.  HEENT: Head atraumatic, normocephalic. Oropharynx dry and nasopharynx clear.  NECK:  Supple, no jugular venous distention. No thyroid enlargement, no tenderness.  LUNGS: Normal breath sounds bilaterally, no wheezing, rales,rhonchi or crepitation. No use of accessory muscles of respiration.  CARDIOVASCULAR: S1, S2 normal. No murmurs, rubs, or gallops.  ABDOMEN: Soft, nontender, nondistended. Bowel sounds present. No organomegaly or mass.  EXTREMITIES: No pedal edema, cyanosis, or clubbing.  NEUROLOGIC: Cranial nerves II through XII are intact. Muscle strength 5/5 in all extremities. Sensation intact. Gait not checked.  PSYCHIATRIC: The patient is alert and oriented x 3.  SKIN: No obvious rash, lesion, or ulcer.   LABORATORY PANEL:   CBC  Recent Labs Lab 11/22/16 1102  WBC 11.4*  HGB 13.1  HCT 38.3  PLT 218  MCV 85.2  MCH 29.1  MCHC 34.2  RDW 13.6   ------------------------------------------------------------------------------------------------------------------  Chemistries   Recent Labs Lab 11/22/16 1102  NA 119*  K 4.7  CL 82*  CO2 26  GLUCOSE 99  BUN 12  CREATININE 0.45  CALCIUM 8.4*    ------------------------------------------------------------------------------------------------------------------ estimated creatinine clearance is 41.3 mL/min (by C-G formula based on SCr of 0.45 mg/dL). ------------------------------------------------------------------------------------------------------------------ No results for input(s): TSH, T4TOTAL, T3FREE, THYROIDAB in the last 72 hours.  Invalid input(s): FREET3   Coagulation profile No results for input(s): INR, PROTIME in the last 168 hours. ------------------------------------------------------------------------------------------------------------------- No results for input(s): DDIMER in the last 72 hours. -------------------------------------------------------------------------------------------------------------------  Cardiac Enzymes No results for input(s): CKMB, TROPONINI, MYOGLOBIN in the last 168 hours.  Invalid input(s): CK ------------------------------------------------------------------------------------------------------------------ Invalid input(s): POCBNP  ---------------------------------------------------------------------------------------------------------------  Urinalysis    Component Value Date/Time   COLORURINE YELLOW (A) 11/22/2016 1102   APPEARANCEUR CLOUDY (A) 11/22/2016 1102   LABSPEC 1.012 11/22/2016 1102   PHURINE 8.0 11/22/2016 1102   GLUCOSEU NEGATIVE 11/22/2016 1102   HGBUR MODERATE (A) 11/22/2016 1102   BILIRUBINUR NEGATIVE 11/22/2016 1102   KETONESUR NEGATIVE 11/22/2016 1102   PROTEINUR 100 (A) 11/22/2016 1102   NITRITE POSITIVE (A) 11/22/2016 1102   LEUKOCYTESUR LARGE (A) 11/22/2016 1102     RADIOLOGY: Dg Chest Port 1 View  Result Date:  11/22/2016 CLINICAL DATA:  Elevated white blood count and low O2 saturation. Shortness of breath. EXAM: PORTABLE CHEST 1 VIEW COMPARISON:  None. FINDINGS: There is a small focal area of consolidation at the left lung base posterior  medially. Overall heart size and pulmonary vascularity are normal. Right lung is clear. Severe arthritic changes are present at both shoulders. Extensive calcification in the aorta. IMPRESSION: 1. Left lower lobe infiltrate. 2. Aortic atherosclerosis. Electronically Signed   By: Lorriane Shire M.D.   On: 11/22/2016 13:06    EKG: Orders placed or performed during the hospital encounter of 11/22/16  . ED EKG  . ED EKG  . EKG 12-Lead  . EKG 12-Lead    IMPRESSION AND PLAN: Patient is a 83 year old with history of multiple medical problems presenting with generalized weakness and deconditioning  1. Hyponatremia This is due to dehydration We'll give normal saline follow BMP  2. Community-acquired pneumonia We'll treat with IV ceftriaxone and azithromycin  3. Urinary tract infection We will treat with IV ceftriaxone follow urine cultures  4. Essential hypertension continue enalapril  5. Hypothyroidism unspecified continue levothyroxine  6, hyperlipidemia unspecified continue lovastatin  7. CODE STATUS full    All the records are reviewed and case discussed with ED provider. Management plans discussed with the patient, family and they are in agreement.  CODE STATUS: Code Status History    This patient does not have a recorded code status. Please follow your organizational policy for patients in this situation.       TOTAL TIME TAKING CARE OF THIS PATIENT55 minutes.    Dustin Flock M.D on 11/22/2016 at 1:42 PM  Between 7am to 6pm - Pager - [phone]  After 6pm go to [email or web address] - password [email or web address] [facility]  [place] [facility]  Office  [phone]  CC: Primary care physician; No primary care provider on file.

## 2016-11-22 NOTE — Plan of Care (Signed)
Report received from [place], [place] in ED.

## 2016-11-22 NOTE — ED Notes (Signed)
Report given to [facility] for room 104

## 2016-11-23 LAB — BASIC METABOLIC PANEL
ANION GAP: 9 (ref 5–15)
BUN: 7 mg/dL (ref 6–20)
CALCIUM: 7.7 mg/dL — AB (ref 8.9–10.3)
CHLORIDE: 88 mmol/L — AB (ref 101–111)
CO2: 26 mmol/L (ref 22–32)
Creatinine, Ser: 0.4 mg/dL — ABNORMAL LOW (ref 0.44–1.00)
GFR calc Af Amer: >60 mL/min (ref >60)
GFR calc non Af Amer: >60 mL/min (ref >60)
GLUCOSE: 91 mg/dL (ref 65–99)
Potassium: 3.2 mmol/L — ABNORMAL LOW (ref 3.5–5.1)
Sodium: 123 mmol/L — ABNORMAL LOW (ref 135–145)

## 2016-11-23 LAB — CBC
HEMATOCRIT: 34.8 % — AB (ref 35.0–47.0)
Hemoglobin: 12.1 g/dL (ref 12.0–16.0)
MCH: 29.6 pg (ref 26.0–34.0)
MCHC: 34.9 g/dL (ref 32.0–36.0)
MCV: 85 fL (ref 80.0–100.0)
Platelets: 222 10*3/uL (ref 150–440)
RBC: 4.09 MIL/uL (ref 3.80–5.20)
RDW: 13.4 % (ref 11.5–14.5)
WBC: 5.9 10*3/uL (ref 3.6–11.0)

## 2016-11-23 MED ORDER — AZITHROMYCIN 250 MG PO TABS
250.0000 mg | ORAL_TABLET | Freq: Every day | ORAL | Status: DC
  Administered 2016-11-23: 250 mg via ORAL
  Filled 2016-11-23: qty 1

## 2016-11-23 MED ORDER — ENSURE ENLIVE PO LIQD
237.0000 mL | Freq: Two times a day (BID) | ORAL | Status: DC
  Administered 2016-11-23: 11:00:00 237 mL via ORAL

## 2016-11-23 MED ORDER — POTASSIUM CHLORIDE CRYS ER 20 MEQ PO TBCR
40.0000 meq | EXTENDED_RELEASE_TABLET | Freq: Once | ORAL | Status: AC
Start: 2016-11-23 — End: 2016-11-23
  Administered 2016-11-23: 40 meq via ORAL
  Filled 2016-11-23: qty 2

## 2016-11-23 NOTE — Evaluation (Signed)
Physical Therapy Evaluation Patient Details Name: Joyce [name] MRN: [id number] DOB: December 26, 1927 Today's Date: 11/23/2016   History of Present Illness  Pt is an 83 y.o. female presenting to hospital with generalized weakness.  Pt admitted to hospital with hyponatremia, community acquired PNA, and UTI.  PMH includes COPD, h/o breast CA s/p mastectomy, htn, enlarged aorta.  Clinical Impression  Prior to hospital admission, pt was ambulating modified independent with rollator.  Pt lives in [place] and is currently visiting her daughter in [place] for a couple months.  Currently pt is SBA with bed mobility and CGA with transfers and ambulation 30 feet with RW (limited d/t fatigue).  Pt's HR 103 bpm at rest and increased to 120 bpm with activity (decreased to 104 bpm with rest in bed end of session).  Pt's O2 93% at rest and decreased to 88% post ambulation (increased back to 93% with rest in bed end of session) all on room air.  Pt would benefit from skilled PT to address noted impairments and functional limitations.  Recommend pt discharge back to daughter's home with 24/7 assist and HHPT when medically appropriate.    Follow Up Recommendations Home health PT;Supervision/Assistance - 24 hour    Equipment Recommendations  Rolling walker with 5" wheels    Recommendations for Other Services       Precautions / Restrictions Precautions Precautions: Fall Restrictions Weight Bearing Restrictions: No      Mobility  Bed Mobility Overal bed mobility: Needs Assistance Bed Mobility: Supine to Sit;Sit to Supine     Supine to sit: Supervision;HOB elevated Sit to supine: Supervision;HOB elevated   General bed mobility comments: increased effort and time to perform bed mobility; 2 assist to boost pt up in bed  Transfers Overall transfer level: Needs assistance Equipment used: Rolling walker (2 wheeled) Transfers: Sit to/from [facility] Sit to Stand: Min guard Stand pivot transfers: Min  guard (transfer to commode)       General transfer comment: increased effort and time to stand  Ambulation/Gait Ambulation/Gait assistance: Min guard Ambulation Distance (Feet): 30 Feet Assistive device: Rolling walker (2 wheeled)   Gait velocity: decreased   General Gait Details: decreased B step length/foot clearance/heelstrike; no loss of balance; SOB with distance; limited distance d/t fatigue  Stairs            Wheelchair Mobility    Modified Rankin (Stroke Patients Only)       Balance Overall balance assessment: Needs assistance Sitting-balance support: Bilateral upper extremity supported;Feet supported Sitting balance-Leahy Scale: Fair     Standing balance support: Single extremity supported (on RW) Standing balance-Leahy Scale: Fair Standing balance comment: Pt able to doff underwear and perform hygiene post toileting with CGA and 1 UE support on RW                             Pertinent Vitals/Pain Pain Assessment: No/denies pain    Home Living Family/patient expects to be discharged to:: Private residence Living Arrangements:  (pt plans to discharge to daughter's home (following is daughter's home set-up)) Available Help at Discharge: Family;Available 24 hours/day Type of Home: House Home Access: Stairs to enter Entrance Stairs-Rails: Right Entrance Stairs-Number of Steps: 3 Home Layout: One level Home Equipment: Walker - 4 wheels      Prior Function Level of Independence: Independent with assistive device(s)         Comments: Pt ambulates with rollator.  Pt is from [place]  and is visiting her daughter in [place] for a couple months.  Pt denies any falls in past 6 months.     Hand Dominance        Extremity/Trunk Assessment   Upper Extremity Assessment Upper Extremity Assessment: Generalized weakness    Lower Extremity Assessment Lower Extremity Assessment: Generalized weakness       Communication   Communication: No  difficulties  Cognition Arousal/Alertness: Awake/alert Behavior During Therapy: Anxious Overall Cognitive Status: Within Functional Limits for tasks assessed                      General Comments General comments (skin integrity, edema, etc.): Pt's daughter present for 2nd half of PT session.  Nursing cleared pt for participation in physical therapy.  Pt agreeable to PT session.    Exercises     Assessment/Plan    PT Assessment Patient needs continued PT services  PT Problem List Decreased strength;Decreased activity tolerance;Decreased balance;Decreased mobility;Cardiopulmonary status limiting activity          PT Treatment Interventions DME instruction;Gait training;Functional mobility training;Therapeutic activities;Therapeutic exercise;Balance training;Patient/family education    PT Goals (Current goals can be found in the Care Plan section)  Acute Rehab PT Goals Patient Stated Goal: to get stronger PT Goal Formulation: With patient/family Time For Goal Achievement: 12/07/16 Potential to Achieve Goals: Fair    Frequency Min 2X/week   Barriers to discharge        Co-evaluation               End of Session Equipment Utilized During Treatment: Gait belt Activity Tolerance: Patient limited by fatigue Patient left: in bed;with call bell/phone within reach;with bed alarm set;with family/visitor present Nurse Communication: Mobility status;Precautions         Time: [id number] PT Time Calculation (min) (ACUTE ONLY): 24 min   Charges:   PT Evaluation $PT Eval Low Complexity: 1 Procedure PT Treatments $Therapeutic Activity: 8-22 mins   PT G CodesLeitha Bleak 12-19-16, 3:39 PM Leitha Bleak, [place]

## 2016-11-23 NOTE — Plan of Care (Signed)
Problem: Nutrition: Goal: Adequate nutrition will be maintained Outcome: Not Progressing Poor PO intake this shift; pt not hungry, anxious

## 2016-11-23 NOTE — Progress Notes (Signed)
[facility] [place] NAME: Joyce [name]    MR#:  [id number]  DATE OF BIRTH:  04-29-1928  SUBJECTIVE:  Came in feeling weak and tired. We'll have UTI and sodium of 119. Poor by mouth intake at home. Daughter and other family members in the room. Patient denies any pain.  REVIEW OF SYSTEMS:   Review of Systems  Constitutional: Negative for chills, fever and weight loss.  HENT: Negative for ear discharge, ear pain and nosebleeds.   Eyes: Negative for blurred vision, pain and discharge.  Respiratory: Negative for sputum production, shortness of breath, wheezing and stridor.   Cardiovascular: Negative for chest pain, palpitations, orthopnea and PND.  Gastrointestinal: Negative for abdominal pain, diarrhea, nausea and vomiting.  Genitourinary: Negative for frequency and urgency.  Musculoskeletal: Negative for back pain and joint pain.  Neurological: Positive for weakness. Negative for sensory change, speech change and focal weakness.  Psychiatric/Behavioral: Negative for depression and hallucinations. The patient is not nervous/anxious.    Tolerating Diet: Tolerating PT:   DRUG ALLERGIES:  No Known Allergies  VITALS:  Blood pressure (!) 153/65, pulse (!) 110, temperature 98 F (36.7 C), temperature source Oral, resp. rate 20, height 5' (1.524 m), weight 58.9 kg (129 lb 12.8 oz), SpO2 95 %.  PHYSICAL EXAMINATION:   Physical Exam  GENERAL:  83 y.o.-year-old patient lying in the bed with no acute distress.  EYES: Pupils equal, round, reactive to light and accommodation. No scleral icterus. Extraocular muscles intact.  HEENT: Head atraumatic, normocephalic. Oropharynx and nasopharynx clear.  NECK:  Supple, no jugular venous distention. No thyroid enlargement, no tenderness.  LUNGS: Normal breath sounds bilaterally, no wheezing, rales, rhonchi. No use of accessory muscles of respiration.  CARDIOVASCULAR: S1, S2 normal. No murmurs, rubs,  or gallops.  ABDOMEN: Soft, nontender, nondistended. Bowel sounds present. No organomegaly or mass.  EXTREMITIES: No cyanosis, clubbing or edema b/l.    NEUROLOGIC: Cranial nerves II through XII are intact. No focal Motor or sensory deficits b/l.   PSYCHIATRIC:  patient is alert and oriented x 3.  SKIN: No obvious rash, lesion, or ulcer.   LABORATORY PANEL:  CBC  Recent Labs Lab 11/23/16 0505  WBC 5.9  HGB 12.1  HCT 34.8*  PLT 222    Chemistries   Recent Labs Lab 11/23/16 0505  NA 123*  K 3.2*  CL 88*  CO2 26  GLUCOSE 91  BUN 7  CREATININE 0.40*  CALCIUM 7.7*   Cardiac Enzymes No results for input(s): TROPONINI in the last 168 hours. RADIOLOGY:  Dg Chest Port 1 View  Result Date: 11/22/2016 CLINICAL DATA:  Elevated white blood count and low O2 saturation. Shortness of breath. EXAM: PORTABLE CHEST 1 VIEW COMPARISON:  None. FINDINGS: There is a small focal area of consolidation at the left lung base posterior medially. Overall heart size and pulmonary vascularity are normal. Right lung is clear. Severe arthritic changes are present at both shoulders. Extensive calcification in the aorta. IMPRESSION: 1. Left lower lobe infiltrate. 2. Aortic atherosclerosis. Electronically Signed   By: Lorriane Shire M.D.   On: 11/22/2016 13:06   ASSESSMENT AND PLAN:  83 year old with history of multiple medical problems presenting with generalized weakness and deconditioning  1. Hyponatremia This is due to dehydration We'll give normal saline Came in with sodium 119-----123  2. Urinary tract infection We will treat with IV ceftriaxone follow urine cultures  3 Essential hypertension continue enalapril  4. Hypothyroidism unspecified continue levothyroxine  5, hyperlipidemia unspecified continue lovastatin  Clinically patient does not have symptoms of pneumonia. I'll discontinue Zithromax  Physical therapy to see patient Case discussed with Care Management/Social  Worker. Management plans discussed with the patient, family and they are in agreement.  CODE STATUS: Full DVT Prophylaxis: Lovenox TOTAL TIME TAKING CARE OF THIS PATIENT: 35 minutes.  >50% time spent on counselling and coordination of care  POSSIBLE D/C IN one DAYS, DEPENDING ON CLINICAL CONDITION.  Note: This dictation was prepared with Dragon dictation along with smaller phrase technology. Any transcriptional errors that result from this process are unintentional.  Talisha Erby M.D on 11/23/2016 at 2:28 PM  Between 7am to 6pm - Pager - [phone]  After 6pm go to [email or web address] - password [email or web address] [facility]  [place] [facility]  Office  [phone]  CC: Primary care physician; No primary care provider on file.

## 2016-11-24 LAB — POTASSIUM: Potassium: 3.6 mmol/L (ref 3.5–5.1)

## 2016-11-24 LAB — SODIUM: Sodium: 126 mmol/L — ABNORMAL LOW (ref 135–145)

## 2016-11-24 MED ORDER — CEPHALEXIN 500 MG PO CAPS: 500.0000 mg | ORAL_CAPSULE | Freq: Two times a day (BID) | ORAL | Status: DC

## 2016-11-24 MED ORDER — ENSURE ENLIVE PO LIQD: 237.0000 mL | Freq: Two times a day (BID) | ORAL | 12 refills | Status: AC

## 2016-11-24 MED ORDER — CEPHALEXIN 500 MG PO CAPS: 500.0000 mg | ORAL_CAPSULE | Freq: Two times a day (BID) | ORAL | 0 refills | Status: DC

## 2016-11-24 MED ORDER — CEFPODOXIME PROXETIL 200 MG PO TABS: 200.0000 mg | ORAL_TABLET | Freq: Two times a day (BID) | ORAL | 0 refills | Status: DC

## 2016-11-24 MED ORDER — CEFPODOXIME PROXETIL 200 MG PO TABS
200.0000 mg | ORAL_TABLET | Freq: Two times a day (BID) | ORAL | Status: DC
  Administered 2016-11-24: 200 mg via ORAL
  Filled 2016-11-24 (×2): qty 1

## 2016-11-24 NOTE — Plan of Care (Signed)
Problem: Pain Managment: Goal: General experience of comfort will improve Outcome: Not Progressing Pt very anxious this shift. PRN medication given with some benefit.  Problem: Nutrition: Goal: Adequate nutrition will be maintained Outcome: Not Progressing Pt ate 1/4 of a banana this shift and asked me to throw it away. She stated she had no appetite

## 2016-11-24 NOTE — Progress Notes (Signed)
MD order received to discharge pt home today; verbally reviewed AVS with pt and pt's daughter, [name], no questions voiced at this time; pt's discharge pending arrival of the pt's son-in-law for her ride home

## 2016-11-24 NOTE — Care Management Note (Signed)
Case Management Note  Patient Details  Name: Joyce [name] MRN: [id number] Date of Birth: Aug 06, 1928  Subjective/Objective:   Discussed discharge planning with [name]'s daughter, [name], cell [phone]. [name] chose [facility] to be [name] home health PT provider. A referral as called to [facility] who will set up the HH-PT referral. Brenton Grills is aware that [name] will be staying with her daughter at [address] [place], [place], [address]. [name] has a RW at [employer] where she will be staying. No other discharge needs identified.                   Action/Plan:   Expected Discharge Date:  11/24/16               Expected Discharge Plan:     In-House Referral:     Discharge planning Services     Post Acute Care Choice:    Choice offered to:     DME Arranged:    DME Agency:     HH Arranged:    HH Agency:     Status of Service:     If discussed at [employer] [facility] [employer], dates discussed:    Additional Comments:  Demarco Bacci A, RN 11/24/2016, 10:21 AM

## 2016-11-24 NOTE — Progress Notes (Signed)
Pt's ride present for discharge, pt discharged via wheelchair by nursing to the visitor's entrance

## 2016-11-24 NOTE — Discharge Instructions (Signed)
Pt's daughter to make appt with [facility] practice for f/u BMP to be done at f/u

## 2016-11-24 NOTE — Discharge Summary (Addendum)
[facility] [place] NAME: Joyce [name]    MR#:  [id number]  DATE OF BIRTH:  [birth year]  DATE OF ADMISSION:  11/22/2016 ADMITTING PHYSICIAN: Dustin Flock, MD  DATE OF DISCHARGE: 11/24/16  PRIMARY CARE PHYSICIAN: [facility]    ADMISSION DIAGNOSIS:  Hyponatremia [E87.1] SIRS (systemic inflammatory response syndrome) (HCC) [R65.10] Urinary tract infection with hematuria, site unspecified [N39.0, R31.9] Pneumonia of left lower lobe due to infectious organism ([place]) [J18.1]  DISCHARGE DIAGNOSIS:  Acute hyponatremia GNR UTI Gen weakness  SECONDARY DIAGNOSIS:   Past Medical History:  Diagnosis Date  . Anxiety   . Asthma   . Breast cancer ([place])   . Cancer ([place])   . COPD (chronic obstructive pulmonary disease) ([place])   . Enlarged aorta ([place])   . GERD (gastroesophageal reflux disease)   . Hx of skin cancer, basal cell   . Hyperlipemia   . Hypertension   . PUD (peptic ulcer disease)     HOSPITAL COURSE:   83 year old with history of multiple medical problems presenting with generalized weakness and deconditioning  1. Hyponatremia This is due to dehydration We'll give normal saline Came in with sodium 119-----123--126  2. Urinary tract infection - IV ceftriaxone---- po cefopodoxime  3 Essentialhypertension  continue enalapril  4. Hypothyroidism unspecified continue levothyroxine  5, hyperlipidemia unspecified continue lovastatin  Clinically patient does not have symptoms of pneumonia. I'll discontinue Zithromax  PT recommends HHPT Discussed dietary recommnedations with pt and family  Pt will f/u [facility] as out pt. BMP at f/u. dter to make appt CONSULTS OBTAINED:    DRUG ALLERGIES:  No Known Allergies  DISCHARGE MEDICATIONS:   Current Discharge Medication List    START taking these medications   Details  cefpodoxime (VANTIN) 200 MG tablet Take 1 tablet (200 mg total) by mouth  every 12 (twelve) hours. Qty: 14 tablet, Refills: 0    feeding supplement, ENSURE ENLIVE, (ENSURE ENLIVE) LIQD Take 237 mLs by mouth 2 (two) times daily between meals. Qty: 237 mL, Refills: 12      CONTINUE these medications which have NOT CHANGED   Details  acetaminophen (TYLENOL) 325 MG tablet Take 650 mg by mouth every 6 (six) hours as needed.    acidophilus (RISAQUAD) CAPS capsule Take 1 capsule by mouth daily.    aspirin EC 81 MG tablet Take 81 mg by mouth daily.    calcium carbonate (TUMS - DOSED IN MG ELEMENTAL CALCIUM) 500 MG chewable tablet Chew 1 tablet by mouth daily.    cholecalciferol (VITAMIN D) 1000 units tablet Take 2,000 Units by mouth daily.    enalapril (VASOTEC) 20 MG tablet Take 20 mg by mouth daily.    Fluticasone-Salmeterol (ADVAIR) 500-50 MCG/DOSE AEPB Inhale 1 puff into the lungs 2 (two) times daily.    levothyroxine (SYNTHROID, LEVOTHROID) 150 MCG tablet Take 150 mcg by mouth daily before breakfast.    LORazepam (ATIVAN) 0.5 MG tablet Take 0.25 mg by mouth every 4 (four) hours as needed for anxiety.    lovastatin (MEVACOR) 40 MG tablet Take 40 mg by mouth at bedtime.    MAGNESIUM-ZINC PO Take 1 tablet by mouth daily.    Multiple Vitamin (MULTIVITAMIN WITH MINERALS) TABS tablet Take 1 tablet by mouth daily.    oxybutynin (DITROPAN) 5 MG tablet Take 5 mg by mouth 3 (three) times daily.    sertraline (ZOLOFT) 50 MG tablet Take 50 mg by mouth daily.  If you experience worsening of your admission symptoms, develop shortness of breath, life threatening emergency, suicidal or homicidal thoughts you must seek medical attention immediately by calling 911 or calling your MD immediately  if symptoms less severe.  You Must read complete instructions/literature along with all the possible adverse reactions/side effects for all the Medicines you take and that have been prescribed to you. Take any new Medicines after you have completely understood and accept  all the possible adverse reactions/side effects.   Please note  You were cared for by a hospitalist during your hospital stay. If you have any questions about your discharge medications or the care you received while you were in the hospital after you are discharged, you can call the unit and asked to speak with the hospitalist on call if the hospitalist that took care of you is not available. Once you are discharged, your primary care physician will handle any further medical issues. Please note that NO REFILLS for any discharge medications will be authorized once you are discharged, as it is imperative that you return to your primary care physician (or establish a relationship with a primary care physician if you do not have one) for your aftercare needs so that they can reassess your need for medications and monitor your lab values. Today   SUBJECTIVE   Doing well Weak Family in the room  VITAL SIGNS:  Blood pressure 127/69, pulse 96, temperature 98 F (36.7 C), temperature source Oral, resp. rate (!) 24, height 5' (1.524 m), weight 58.9 kg (129 lb 12.8 oz), SpO2 92 %.  I/O:    Intake/Output Summary (Last 24 hours) at 11/24/16 1047 Last data filed at 11/24/16 1033  Gross per 24 hour  Intake           2778.9 ml  Output             1300 ml  Net           1478.9 ml    PHYSICAL EXAMINATION:  GENERAL:  83 y.o.-year-old patient lying in the bed with no acute distress.  EYES: Pupils equal, round, reactive to light and accommodation. No scleral icterus. Extraocular muscles intact.  HEENT: Head atraumatic, normocephalic. Oropharynx and nasopharynx clear.  NECK:  Supple, no jugular venous distention. No thyroid enlargement, no tenderness.  LUNGS: Normal breath sounds bilaterally, no wheezing, rales,rhonchi or crepitation. No use of accessory muscles of respiration.  CARDIOVASCULAR: S1, S2 normal. No murmurs, rubs, or gallops.  ABDOMEN: Soft, non-tender, non-distended. Bowel sounds  present. No organomegaly or mass.  EXTREMITIES: No pedal edema, cyanosis, or clubbing.  NEUROLOGIC: Cranial nerves II through XII are intact. Muscle strength 5/5 in all extremities. Sensation intact. Gait not checked. weak PSYCHIATRIC: The patient is alert and oriented x 3.  SKIN: No obvious rash, lesion, or ulcer.   DATA REVIEW:   CBC   Recent Labs Lab 11/23/16 0505  WBC 5.9  HGB 12.1  HCT 34.8*  PLT 222    Chemistries   Recent Labs Lab 11/23/16 0505 11/24/16 0413  NA 123* 126*  K 3.2* 3.6  CL 88*  --   CO2 26  --   GLUCOSE 91  --   BUN 7  --   CREATININE 0.40*  --   CALCIUM 7.7*  --     Microbiology Results   Recent Results (from the past 240 hour(s))  Urine culture     Status: Abnormal (Preliminary result)   Collection Time: 11/22/16 11:02 AM  Result  Value Ref Range Status   Specimen Description URINE, RANDOM  Final   Special Requests NONE  Final   Culture (A)  Final    >=100,000 COLONIES/mL ESCHERICHIA COLI SUSCEPTIBILITIES TO FOLLOW Performed at [facility], [address] [place], [place] [address]    Report Status PENDING  Incomplete  Culture, blood (routine x 2)     Status: None (Preliminary result)   Collection Time: 11/22/16  2:41 PM  Result Value Ref Range Status   Specimen Description BLOOD LEFT WRIST  Final   Special Requests BOTTLES DRAWN AEROBIC AND ANAEROBIC ARE2ML ANA2ML  Final   Culture NO GROWTH 2 DAYS  Final   Report Status PENDING  Incomplete  Culture, blood (routine x 2)     Status: None (Preliminary result)   Collection Time: 11/22/16  2:41 PM  Result Value Ref Range Status   Specimen Description BLOOD LEFT ASSIST CONTROL  Final   Special Requests BOTTLES DRAWN AEROBIC AND ANAEROBIC ANA1ML AER1ML  Final   Culture NO GROWTH 2 DAYS  Final   Report Status PENDING  Incomplete    RADIOLOGY:  Dg Chest Port 1 View  Result Date: 11/22/2016 CLINICAL DATA:  Elevated white blood count and low O2 saturation. Shortness of breath.  EXAM: PORTABLE CHEST 1 VIEW COMPARISON:  None. FINDINGS: There is a small focal area of consolidation at the left lung base posterior medially. Overall heart size and pulmonary vascularity are normal. Right lung is clear. Severe arthritic changes are present at both shoulders. Extensive calcification in the aorta. IMPRESSION: 1. Left lower lobe infiltrate. 2. Aortic atherosclerosis. Electronically Signed   By: Lorriane Shire M.D.   On: 11/22/2016 13:06     Management plans discussed with the patient, family and they are in agreement.  CODE STATUS:     Code Status Orders        Start     Ordered   11/22/16 1859  Full code  Continuous     11/22/16 1858    Code Status History    Date Active Date Inactive Code Status Order ID Comments User Context   This patient has a current code status but no historical code status.      TOTAL TIME TAKING CARE OF THIS PATIENT: 40 minutes.    Matsue Strom M.D on 11/24/2016 at 10:47 AM  Between 7am to 6pm - Pager - [phone] After 6pm go to [email or web address] - password [email or web address] [place] [facility]  Office  [phone]  CC: Primary care physician; [facility]

## 2016-11-25 LAB — URINE CULTURE

## 2016-11-26 DIAGNOSIS — J181 Lobar pneumonia, unspecified organism: Secondary | ICD-10-CM | POA: Diagnosis not present

## 2016-11-26 DIAGNOSIS — E785 Hyperlipidemia, unspecified: Secondary | ICD-10-CM | POA: Diagnosis not present

## 2016-11-26 DIAGNOSIS — K219 Gastro-esophageal reflux disease without esophagitis: Secondary | ICD-10-CM | POA: Diagnosis not present

## 2016-11-26 DIAGNOSIS — Z853 Personal history of malignant neoplasm of breast: Secondary | ICD-10-CM | POA: Diagnosis not present

## 2016-11-26 DIAGNOSIS — F419 Anxiety disorder, unspecified: Secondary | ICD-10-CM | POA: Diagnosis not present

## 2016-11-26 DIAGNOSIS — Z7951 Long term (current) use of inhaled steroids: Secondary | ICD-10-CM | POA: Diagnosis not present

## 2016-11-26 DIAGNOSIS — E871 Hypo-osmolality and hyponatremia: Secondary | ICD-10-CM | POA: Diagnosis not present

## 2016-11-26 DIAGNOSIS — J440 Chronic obstructive pulmonary disease with acute lower respiratory infection: Secondary | ICD-10-CM | POA: Diagnosis not present

## 2016-11-26 DIAGNOSIS — J45909 Unspecified asthma, uncomplicated: Secondary | ICD-10-CM | POA: Diagnosis not present

## 2016-11-26 DIAGNOSIS — N390 Urinary tract infection, site not specified: Secondary | ICD-10-CM | POA: Diagnosis not present

## 2016-11-26 DIAGNOSIS — I10 Essential (primary) hypertension: Secondary | ICD-10-CM | POA: Diagnosis not present

## 2016-11-27 ENCOUNTER — Ambulatory Visit (INDEPENDENT_AMBULATORY_CARE_PROVIDER_SITE_OTHER): Payer: Medicare Other | Admitting: Family Medicine

## 2016-11-27 ENCOUNTER — Other Ambulatory Visit: Payer: Self-pay | Admitting: Family Medicine

## 2016-11-27 ENCOUNTER — Encounter: Payer: Self-pay | Admitting: Family Medicine

## 2016-11-27 VITALS — BP 126/70 | HR 104 | Temp 97.4°F | Resp 28 | Wt 132.0 lb

## 2016-11-27 DIAGNOSIS — F411 Generalized anxiety disorder: Secondary | ICD-10-CM

## 2016-11-27 DIAGNOSIS — N390 Urinary tract infection, site not specified: Secondary | ICD-10-CM | POA: Diagnosis not present

## 2016-11-27 DIAGNOSIS — J42 Unspecified chronic bronchitis: Secondary | ICD-10-CM

## 2016-11-27 DIAGNOSIS — E871 Hypo-osmolality and hyponatremia: Secondary | ICD-10-CM

## 2016-11-27 DIAGNOSIS — J181 Lobar pneumonia, unspecified organism: Secondary | ICD-10-CM

## 2016-11-27 DIAGNOSIS — B9620 Unspecified Escherichia coli [E. coli] as the cause of diseases classified elsewhere: Secondary | ICD-10-CM

## 2016-11-27 DIAGNOSIS — J189 Pneumonia, unspecified organism: Secondary | ICD-10-CM

## 2016-11-27 LAB — CULTURE, BLOOD (ROUTINE X 2)
CULTURE: NO GROWTH
Culture: NO GROWTH

## 2016-11-27 MED ORDER — LORAZEPAM 0.5 MG PO TABS: 0.2500 mg | ORAL_TABLET | Freq: Three times a day (TID) | ORAL | 3 refills | Status: DC | PRN

## 2016-11-27 MED ORDER — SERTRALINE HCL 100 MG PO TABS: 50.0000 mg | ORAL_TABLET | Freq: Every day | ORAL | 6 refills | Status: DC

## 2016-11-27 MED ORDER — SERTRALINE HCL 100 MG PO TABS: 100.0000 mg | ORAL_TABLET | Freq: Every day | ORAL | 6 refills | Status: DC

## 2016-11-27 NOTE — Progress Notes (Signed)
Patient: Joyce [name] Female    DOB: 1928-10-11   83 y.o.   MRN: [id number] Visit Date: 11/27/2016  Today's Provider: [name], MD   Chief Complaint  Patient presents with  . Establish Care  . Hospitalization Follow-up   Subjective:    HPI   Establish Care Pt is here to establish care today. Pt lives in [place], and was visiting daughter when pt was hospitalized. Pt goes to the [facility] normally for care, but needed to FU with a local doctor. Pt will be visiting for 2 months. Back in [place] it is not unusual for her to have a  hospitalization at least once a year for COPD exacerbation/pneumonia.   Follow up Hospitalization  Patient was admitted to [facility] on 11/22/2016 and discharged on 11/24/2016. She was treated for hyponatremia and UTI. Treatment for this included IV normal saline, IV ceftriaxone, po cefopodoxime.  She reports excellent compliance with treatment. She reports this condition is Improved. Urine cx showed pt had E. Coli infection. Pt needs BMP rechecked today. ------------------------------------------------------------------------------------     Allergies  Allergen Reactions  . No Known Allergies     verified     Current Outpatient Prescriptions:  .  acetaminophen (TYLENOL) 325 MG tablet, Take 650 mg by mouth every 6 (six) hours as needed., Disp: , Rfl:  .  albuterol (PROVENTIL HFA;VENTOLIN HFA) 108 (90 Base) MCG/ACT inhaler, Inhale into the lungs., Disp: , Rfl:  .  aspirin EC 81 MG tablet, Take 81 mg by mouth daily., Disp: , Rfl:  .  calcium carbonate (TUMS - DOSED IN MG ELEMENTAL CALCIUM) 500 MG chewable tablet, Chew 1 tablet by mouth daily., Disp: , Rfl:  .  cefpodoxime (VANTIN) 200 MG tablet, Take 1 tablet (200 mg total) by mouth every 12 (twelve) hours., Disp: 14 tablet, Rfl: 0 .  cholecalciferol (VITAMIN D) 1000 units tablet, Take 2,000 Units by mouth daily., Disp: , Rfl:  .  enalapril (VASOTEC) 20 MG tablet, Take 20 mg  by mouth daily., Disp: , Rfl:  .  feeding supplement, ENSURE ENLIVE, (ENSURE ENLIVE) LIQD, Take 237 mLs by mouth 2 (two) times daily between meals., Disp: 237 mL, Rfl: 12 .  Fluticasone-Salmeterol (ADVAIR) 500-50 MCG/DOSE AEPB, Inhale 1 puff into the lungs 2 (two) times daily., Disp: , Rfl:  .  levothyroxine (SYNTHROID, LEVOTHROID) 150 MCG tablet, Take 150 mcg by mouth daily before breakfast., Disp: , Rfl:  .  LORazepam (ATIVAN) 0.5 MG tablet, Take 0.25 mg by mouth every 4 (four) hours as needed for anxiety., Disp: , Rfl:  .  lovastatin (MEVACOR) 40 MG tablet, Take 40 mg by mouth at bedtime., Disp: , Rfl:  .  MAGNESIUM-ZINC PO, Take 1 tablet by mouth daily., Disp: , Rfl:  .  Multiple Vitamin (MULTIVITAMIN WITH MINERALS) TABS tablet, Take 1 tablet by mouth daily., Disp: , Rfl:  .  sertraline (ZOLOFT) 50 MG tablet, Take 50 mg by mouth daily., Disp: , Rfl:  .  acidophilus (RISAQUAD) CAPS capsule, Take 1 capsule by mouth daily., Disp: , Rfl:  .  oxybutynin (DITROPAN) 5 MG tablet, Take 5 mg by mouth 3 (three) times daily., Disp: , Rfl:   Review of Systems  Constitutional: Positive for activity change, appetite change and fatigue. Negative for chills, diaphoresis, fever and unexpected weight change.  HENT: Positive for hearing loss. Negative for congestion, dental problem, drooling, ear discharge, ear pain, facial swelling, mouth sores, nosebleeds, postnasal drip, rhinorrhea, sinus pain, sinus  pressure, sneezing, sore throat, tinnitus, trouble swallowing and voice change.   Eyes: Negative for photophobia, pain, discharge, redness, itching and visual disturbance.  Respiratory: Positive for shortness of breath and wheezing. Negative for apnea, cough, choking, chest tightness and stridor.   Cardiovascular: Negative for chest pain, palpitations and leg swelling.  Gastrointestinal: Positive for diarrhea and nausea. Negative for abdominal distention, abdominal pain, anal bleeding, blood in stool, constipation,  rectal pain and vomiting.  Endocrine: Positive for polydipsia. Negative for cold intolerance, heat intolerance, polyphagia and polyuria.  Genitourinary: Negative for decreased urine volume, difficulty urinating, dyspareunia, dysuria, enuresis, flank pain, frequency, genital sores, hematuria, menstrual problem, pelvic pain, urgency, vaginal bleeding, vaginal discharge and vaginal pain.  Musculoskeletal: Negative for arthralgias, back pain, gait problem, joint swelling, myalgias, neck pain and neck stiffness.  Skin: Negative for color change, pallor, rash and wound.  Allergic/Immunologic: Negative for environmental allergies, food allergies and immunocompromised state.  Neurological: Positive for weakness and light-headedness. Negative for dizziness, tremors, seizures, syncope, facial asymmetry, speech difficulty, numbness and headaches.  Hematological: Negative for adenopathy. Does not bruise/bleed easily.  Psychiatric/Behavioral: Positive for decreased concentration. Negative for agitation, behavioral problems, confusion, dysphoric mood, hallucinations, self-injury, sleep disturbance and suicidal ideas. The patient is nervous/anxious. The patient is not hyperactive.     Social History  Substance Use Topics  . Smoking status: Never Smoker  . Smokeless tobacco: Never Used  . Alcohol use No   Objective:   BP 126/70 (BP Location: Right Arm, Patient Position: Sitting, Cuff Size: Normal)   Pulse (!) 104   Temp 97.4 F (36.3 C) (Oral)   Resp (!) 28   Wt 132 lb (59.9 kg)   SpO2 94%   BMI 25.78 kg/m   Physical Exam  Constitutional: She appears well-developed and well-nourished.  HENT:  Head: Normocephalic and atraumatic.  Right Ear: External ear normal.  Left Ear: External ear normal.  Nose: Nose normal.  Cardiovascular: Regular rhythm and normal heart sounds.   Pulmonary/Chest: Effort normal. Tachypnea noted. She has wheezes. She has rales.  Mild diffuse expiratory rhonchi worse on the  right  Abdominal: Soft. She exhibits no distension. There is no tenderness.  Musculoskeletal: She exhibits no edema.  Increased thoracic kyphosis.  Skin: Skin is warm and dry.  Psychiatric: She has a normal mood and affect. Her behavior is normal. Judgment and thought content normal.        Assessment & Plan:     1. Hyponatremia Recheck labs. - Renal function panel  2. E. coli UTI Recheck urine cx to ensure infection has cleared. - Urine culture  3. Pneumonia of left lower lobe due to infectious organism ([place]) Recheck CXR. Could be cause of fatigue. FU 2-4 weeks. - DG Chest 2 View - CBC with Differential/Platelet  4. Generalized anxiety disorder Worsening per daughter. Will increase Zoloft to 100 mg and Ativan to TID. Prescriptions to be filled when needed. - LORazepam (ATIVAN) 0.5 MG tablet; Take 0.5 tablets (0.25 mg total) by mouth 3 (three) times daily as needed for anxiety.  Dispense: 90 tablet; Refill: 3 - sertraline (ZOLOFT) 100 MG tablet; Take 1 tablet (100 mg total) by mouth daily.  Dispense: 30 tablet; Refill: 6  5. Chronic bronchitis, unspecified chronic bronchitis type ([place])  6.COPD    Patient seen and examined by Miguel Aschoff, MD, and note scribed by Renaldo Fiddler, CMA. I have done the exam and reviewed the above chart and it is accurate to the best of my knowledge. Development worker, community  has been used in this note in any air is in the dictation or transcription are unintentional.  [name], MD  [place]

## 2016-11-28 ENCOUNTER — Ambulatory Visit
Admission: RE | Admit: 2016-11-28 | Discharge: 2016-11-28 | Disposition: A | Discharge status: Home / Self Care | Payer: Medicare Other | Source: Ambulatory Visit | Attending: Family Medicine | Admitting: Family Medicine

## 2016-11-28 DIAGNOSIS — J189 Pneumonia, unspecified organism: Secondary | ICD-10-CM | POA: Insufficient documentation

## 2016-11-28 DIAGNOSIS — J181 Lobar pneumonia, unspecified organism: Secondary | ICD-10-CM | POA: Diagnosis present

## 2016-11-28 DIAGNOSIS — I517 Cardiomegaly: Secondary | ICD-10-CM | POA: Insufficient documentation

## 2016-11-28 DIAGNOSIS — N390 Urinary tract infection, site not specified: Secondary | ICD-10-CM | POA: Diagnosis not present

## 2016-11-28 DIAGNOSIS — J90 Pleural effusion, not elsewhere classified: Secondary | ICD-10-CM | POA: Diagnosis not present

## 2016-11-28 DIAGNOSIS — J440 Chronic obstructive pulmonary disease with acute lower respiratory infection: Secondary | ICD-10-CM | POA: Insufficient documentation

## 2016-11-28 DIAGNOSIS — I700 Atherosclerosis of aorta: Secondary | ICD-10-CM | POA: Insufficient documentation

## 2016-11-28 DIAGNOSIS — I10 Essential (primary) hypertension: Secondary | ICD-10-CM | POA: Diagnosis not present

## 2016-11-28 DIAGNOSIS — E871 Hypo-osmolality and hyponatremia: Secondary | ICD-10-CM | POA: Diagnosis not present

## 2016-11-28 DIAGNOSIS — J45909 Unspecified asthma, uncomplicated: Secondary | ICD-10-CM | POA: Diagnosis not present

## 2016-11-29 LAB — URINE CULTURE: ORGANISM ID, BACTERIA: NO GROWTH

## 2016-11-29 LAB — CBC WITH DIFFERENTIAL/PLATELET
BASOS ABS: 0 10*3/uL (ref 0.0–0.2)
Basos: 0 %
EOS (ABSOLUTE): 0 10*3/uL (ref 0.0–0.4)
Eos: 0 %
Hematocrit: 36.4 % (ref 34.0–46.6)
Hemoglobin: 12.8 g/dL (ref 11.1–15.9)
IMMATURE GRANULOCYTES: 1 %
Immature Grans (Abs): 0.1 10*3/uL (ref 0.0–0.1)
Lymphocytes Absolute: 1.4 10*3/uL (ref 0.7–3.1)
Lymphs: 10 %
MCH: 29.5 pg (ref 26.6–33.0)
MCHC: 35.2 g/dL (ref 31.5–35.7)
MCV: 84 fL (ref 79–97)
MONOS ABS: 1.3 10*3/uL — AB (ref 0.1–0.9)
Monocytes: 10 %
NEUTROS PCT: 79 %
Neutrophils Absolute: 10.3 10*3/uL — ABNORMAL HIGH (ref 1.4–7.0)
PLATELETS: 548 10*3/uL — AB (ref 150–379)
RBC: 4.34 x10E6/uL (ref 3.77–5.28)
RDW: 14.4 % (ref 12.3–15.4)
WBC: 13.1 10*3/uL — AB (ref 3.4–10.8)

## 2016-11-29 LAB — URINALYSIS, ROUTINE W REFLEX MICROSCOPIC
BILIRUBIN UA: NEGATIVE
GLUCOSE, UA: NEGATIVE
Ketones, UA: NEGATIVE
LEUKOCYTES UA: NEGATIVE
Nitrite, UA: NEGATIVE
PROTEIN UA: NEGATIVE
RBC UA: NEGATIVE
SPEC GRAV UA: 1.014 (ref 1.005–1.030)
UUROB: 0.2 mg/dL (ref 0.2–1.0)
pH, UA: 7.5 (ref 5.0–7.5)

## 2016-11-29 LAB — RENAL FUNCTION PANEL
Albumin: 3.9 g/dL (ref 3.5–4.7)
BUN/Creatinine Ratio: 28 (ref 12–28)
BUN: 13 mg/dL (ref 8–27)
CALCIUM: 9.7 mg/dL (ref 8.7–10.3)
CHLORIDE: 83 mmol/L — AB (ref 96–106)
CO2: 23 mmol/L (ref 18–29)
CREATININE: 0.47 mg/dL — AB (ref 0.57–1.00)
GFR calc Af Amer: 103 mL/min/{1.73_m2} (ref >59)
GFR calc non Af Amer: 90 mL/min/{1.73_m2} (ref >59)
Glucose: 86 mg/dL (ref 65–99)
PHOSPHORUS: 3.9 mg/dL (ref 2.5–4.5)
Potassium: 4.7 mmol/L (ref 3.5–5.2)
SODIUM: 125 mmol/L — AB (ref 134–144)

## 2016-12-03 ENCOUNTER — Telehealth: Payer: Self-pay

## 2016-12-03 NOTE — Telephone Encounter (Signed)
Advised daughter through [place]

## 2016-12-03 NOTE — Telephone Encounter (Signed)
my mother,   Lenayah Lantzer saw Dr Serina Cowper Wed. for hospital follow up  I think she is better physically but is having extreme panic/anxiety attacks  very out of character for her. she doesn't want me out of her sight, not even within our house. also, she isn't taking care of herself, won't take showers or brush her teeth. again, very out if character...could this be dementia? how is that diagnosed?  what kind of specialist would she need to see?  she has an appt. with Dr Rosanna Randy next week  I appreciate any help/advice you can offer  thanks  [name]   This came from [facility]

## 2016-12-03 NOTE — Telephone Encounter (Signed)
It could be that she has some early dementia worsened by her acute illness. We can evaluate that next week at her appointment. We can also start The referral process to neurology which is who would evaluate this further.

## 2016-12-04 DIAGNOSIS — J45909 Unspecified asthma, uncomplicated: Secondary | ICD-10-CM | POA: Diagnosis not present

## 2016-12-04 DIAGNOSIS — E871 Hypo-osmolality and hyponatremia: Secondary | ICD-10-CM | POA: Diagnosis not present

## 2016-12-04 DIAGNOSIS — J440 Chronic obstructive pulmonary disease with acute lower respiratory infection: Secondary | ICD-10-CM | POA: Diagnosis not present

## 2016-12-04 DIAGNOSIS — N390 Urinary tract infection, site not specified: Secondary | ICD-10-CM | POA: Diagnosis not present

## 2016-12-04 DIAGNOSIS — I10 Essential (primary) hypertension: Secondary | ICD-10-CM | POA: Diagnosis not present

## 2016-12-04 DIAGNOSIS — J181 Lobar pneumonia, unspecified organism: Secondary | ICD-10-CM | POA: Diagnosis not present

## 2016-12-09 ENCOUNTER — Ambulatory Visit (INDEPENDENT_AMBULATORY_CARE_PROVIDER_SITE_OTHER): Payer: Medicare Other | Admitting: Family Medicine

## 2016-12-09 VITALS — BP 128/62 | HR 106 | Temp 97.9°F | Resp 14 | Wt 128.0 lb

## 2016-12-09 DIAGNOSIS — R000 Tachycardia, unspecified: Secondary | ICD-10-CM

## 2016-12-09 DIAGNOSIS — E871 Hypo-osmolality and hyponatremia: Secondary | ICD-10-CM | POA: Diagnosis not present

## 2016-12-09 DIAGNOSIS — J449 Chronic obstructive pulmonary disease, unspecified: Secondary | ICD-10-CM | POA: Diagnosis not present

## 2016-12-09 DIAGNOSIS — R634 Abnormal weight loss: Secondary | ICD-10-CM | POA: Diagnosis not present

## 2016-12-09 DIAGNOSIS — I10 Essential (primary) hypertension: Secondary | ICD-10-CM

## 2016-12-09 DIAGNOSIS — F411 Generalized anxiety disorder: Secondary | ICD-10-CM

## 2016-12-09 MED ORDER — DILTIAZEM HCL ER 120 MG PO CP24: 120.0000 mg | ORAL_CAPSULE | Freq: Every day | ORAL | 12 refills | Status: DC

## 2016-12-09 NOTE — Progress Notes (Signed)
Joyce [name]  MRN: [id number] DOB: 1928-01-08  Subjective:  HPI  Patient is here for 2 week follow up.  On her last visit Chest Xray was ordered to see if pneumonia cleared up. She has finished the antibiotic. Sodium and Potassium were re checked. Potassium improved Sodium level the same, Sertraline was increased to 100 mg and Lorazepam to 3 times daily. Patient feels like this regimen is helping her in the morning. She is still having issues with anxiety and getting panic attacks. When her daughter needs to leave the house to get something done patient gets very anxious. She gets shaky and has hard time breathing, she does not get physical with anyone. She is sleeping well. She has lost 4 lbs since last visit. She is still drinking Ensure, she is eating but small portions. Patient Active Problem List   Diagnosis Date Noted  . Hyponatremia 11/22/2016  . Osteoporosis 09/24/2016  . 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 Hematuria 07/02/2016  . Mass of hand 06/04/2016  . Ascending aorta dilatation (HCC) 05/15/2016  . Generalized anxiety disorder 10/24/2015  . Postoperative hypothyroidism 12/15/2013  . Papillary carcinoma of thyroid ([place]) 11/26/2013  . Primary hyperparathyroidism ([place]) 04/01/2013  . Osteoarthritis of knee 03/23/2013  . Nephrolithiasis 02/03/2013  . Malignant neoplasm of female breast ([place]) 11/28/2009  . Asthma 02/24/2009  . Bronchiectasis without acute exacerbation ([place]) 02/24/2009  . Arthropathy of hand 06/16/2008  . Hyperlipidemia 11/16/2007  . Essential hypertension 11/16/2007    Past Medical History:  Diagnosis Date  . Anxiety   . Asthma   . Breast cancer ([place])   . Cancer ([place])   . COPD (chronic obstructive pulmonary disease) ([place])   . Enlarged aorta ([place])   . GERD (gastroesophageal reflux disease)   . Hx of skin cancer, basal cell   . Hyperlipemia   . Hypertension   . PUD (peptic ulcer disease)     Social History   Social  History  . Marital status: Widowed    Spouse name: N/A  . Number of children: N/A  . Years of education: N/A   Occupational History  . Not on file.   Social History Main Topics  . Smoking status: Never Smoker  . Smokeless tobacco: Never Used  . Alcohol use Yes     Comment: occasional beer  . Drug use: No  . Sexual activity: No   Other Topics Concern  . Not on file   Social History Narrative  . No narrative on file    Outpatient Encounter Prescriptions as of 12/09/2016  Medication Sig  . acetaminophen (TYLENOL) 325 MG tablet Take 650 mg by mouth every 6 (six) hours as needed.  Marland Kitchen albuterol (PROVENTIL HFA;VENTOLIN HFA) 108 (90 Base) MCG/ACT inhaler Inhale into the lungs.  Marland Kitchen aspirin EC 81 MG tablet Take 81 mg by mouth daily.  . calcium carbonate (TUMS - DOSED IN MG ELEMENTAL CALCIUM) 500 MG chewable tablet Chew 1 tablet by mouth daily.  . cholecalciferol (VITAMIN D) 1000 units tablet Take 2,000 Units by mouth daily.  . enalapril (VASOTEC) 20 MG tablet Take 20 mg by mouth daily.  . feeding supplement, ENSURE ENLIVE, (ENSURE ENLIVE) LIQD Take 237 mLs by mouth 2 (two) times daily between meals.  . Fluticasone-Salmeterol (ADVAIR) 500-50 MCG/DOSE AEPB Inhale 1 puff into the lungs 2 (two) times daily.  Marland Kitchen levothyroxine (SYNTHROID, LEVOTHROID) 150 MCG tablet Take 150 mcg by mouth daily before breakfast.  . LORazepam (ATIVAN) 0.5 MG tablet Take  0.5 tablets (0.25 mg total) by mouth 3 (three) times daily as needed for anxiety.  . lovastatin (MEVACOR) 40 MG tablet Take 40 mg by mouth at bedtime.  Marland Kitchen MAGNESIUM-ZINC PO Take 1 tablet by mouth daily.  . Multiple Vitamin (MULTIVITAMIN WITH MINERALS) TABS tablet Take 1 tablet by mouth daily.  Marland Kitchen oxybutynin (DITROPAN) 5 MG tablet Take 5 mg by mouth 3 (three) times daily.  . sertraline (ZOLOFT) 100 MG tablet Take 1 tablet (100 mg total) by mouth daily.  Marland Kitchen acidophilus (RISAQUAD) CAPS capsule Take 1 capsule by mouth daily.  . [DISCONTINUED] cefpodoxime  (VANTIN) 200 MG tablet Take 1 tablet (200 mg total) by mouth every 12 (twelve) hours.   No facility-administered encounter medications on file as of 12/09/2016.     Allergies  Allergen Reactions  . No Known Allergies     verified    Review of Systems  Constitutional: Positive for malaise/fatigue and weight loss.  Respiratory: Positive for shortness of breath (off and on chronic).   Cardiovascular: Negative.   Musculoskeletal: Positive for back pain, joint pain, myalgias and neck pain.  Neurological: Positive for weakness.  Psychiatric/Behavioral: Negative for depression, hallucinations and suicidal ideas. The patient is nervous/anxious. The patient does not have insomnia.     Objective:  BP 128/62   Pulse (!) 106   Temp 97.9 F (36.6 C)   Resp 14   Wt 128 lb (58.1 kg)   SpO2 95%   BMI 25.00 kg/m   Physical Exam  Constitutional: She is oriented to person, place, and time and well-developed, well-nourished, and in no distress.  HENT:  Head: Normocephalic and atraumatic.  Right Ear: External ear normal.  Left Ear: External ear normal.  Nose: Nose normal.  Eyes: Conjunctivae are normal. Pupils are equal, round, and reactive to light.  Cardiovascular: Regular rhythm, normal heart sounds and intact distal pulses.  Tachycardia present.   No murmur heard. Pulmonary/Chest: Effort normal and breath sounds normal. No respiratory distress. She has no wheezes.  rhonchi  Abdominal: Soft.  Musculoskeletal:  Using a walker for transportation  Neurological: She is alert and oriented to person, place, and time.  Skin: Skin is warm and dry.  Psychiatric: Mood and memory normal.    Assessment and Plan :  1. Generalized anxiety disorder The same. This could be contributing due to tachycardia.  2. Essential hypertension  3. Chronic obstructive pulmonary disease, unspecified COPD type ([place]) I think this is the main issue for this pt . Severe COPD. 4. Unintentional weight loss Follow  for now.  5. Hyponatremia Better. 6. Tachycardia Start Cardizem and re check on the next visit. - EKG 12-Lead Take Lovastatin 1/2 tablet daily while on the new medication. 7.Failure to Thrive HPI, Exam and A&P transcribed under direction and in the presence of Miguel Aschoff, MD. I have done the exam and reviewed the chart and it is accurate to the best of my knowledge. Development worker, community has been used and  any errors in dictation or transcription are unintentional. Miguel Aschoff M.D. [facility]

## 2016-12-09 NOTE — Patient Instructions (Signed)
Take Lovastatin 40 mg 1/2 tablet daily instead of 1 whole tablet.

## 2016-12-10 ENCOUNTER — Encounter: Payer: Self-pay | Admitting: Family Medicine

## 2016-12-10 ENCOUNTER — Telehealth: Payer: Self-pay

## 2016-12-10 NOTE — Telephone Encounter (Signed)
My Mother, Moyinoluwa Angelastro, saw Dr. Rosanna Randy yesterday, 2/5.Marland Kitchen   For the last 2 days she doesn't seem to be able to keep her legs still...while sitting in the chair she  is constantly tapping her toes or just moving her legs.. Is this a symptom of the rapid heart beat she  had yesterday? I just gave her the first of the new medication today at noon.   She was able to sleep well last night.    I wasn't sure if this was something to be concerned about?    Thanks  [name]   Please review-aa

## 2016-12-11 ENCOUNTER — Ambulatory Visit: Payer: PRIVATE HEALTH INSURANCE | Admitting: Family Medicine

## 2016-12-11 DIAGNOSIS — E871 Hypo-osmolality and hyponatremia: Secondary | ICD-10-CM | POA: Diagnosis not present

## 2016-12-11 DIAGNOSIS — J440 Chronic obstructive pulmonary disease with acute lower respiratory infection: Secondary | ICD-10-CM | POA: Diagnosis not present

## 2016-12-11 DIAGNOSIS — N390 Urinary tract infection, site not specified: Secondary | ICD-10-CM | POA: Diagnosis not present

## 2016-12-11 DIAGNOSIS — I10 Essential (primary) hypertension: Secondary | ICD-10-CM | POA: Diagnosis not present

## 2016-12-11 DIAGNOSIS — J45909 Unspecified asthma, uncomplicated: Secondary | ICD-10-CM | POA: Diagnosis not present

## 2016-12-11 DIAGNOSIS — J181 Lobar pneumonia, unspecified organism: Secondary | ICD-10-CM | POA: Diagnosis not present

## 2016-12-11 NOTE — Telephone Encounter (Signed)
Patient's daughter advised through my chart-aa

## 2016-12-11 NOTE — Telephone Encounter (Signed)
I don't think this is anything worrisome. Could be the anxiety, could be mild restless leg syndrome.

## 2016-12-13 DIAGNOSIS — E871 Hypo-osmolality and hyponatremia: Secondary | ICD-10-CM | POA: Diagnosis not present

## 2016-12-13 DIAGNOSIS — I10 Essential (primary) hypertension: Secondary | ICD-10-CM | POA: Diagnosis not present

## 2016-12-13 DIAGNOSIS — J181 Lobar pneumonia, unspecified organism: Secondary | ICD-10-CM | POA: Diagnosis not present

## 2016-12-13 DIAGNOSIS — J440 Chronic obstructive pulmonary disease with acute lower respiratory infection: Secondary | ICD-10-CM | POA: Diagnosis not present

## 2016-12-13 DIAGNOSIS — J45909 Unspecified asthma, uncomplicated: Secondary | ICD-10-CM | POA: Diagnosis not present

## 2016-12-13 DIAGNOSIS — N390 Urinary tract infection, site not specified: Secondary | ICD-10-CM | POA: Diagnosis not present

## 2016-12-16 ENCOUNTER — Encounter: Payer: Self-pay | Admitting: Family Medicine

## 2016-12-16 ENCOUNTER — Ambulatory Visit (INDEPENDENT_AMBULATORY_CARE_PROVIDER_SITE_OTHER): Payer: Medicare Other | Admitting: Family Medicine

## 2016-12-16 VITALS — BP 148/78 | HR 116 | Temp 98.5°F | Resp 20 | Wt 126.0 lb

## 2016-12-16 DIAGNOSIS — R000 Tachycardia, unspecified: Secondary | ICD-10-CM

## 2016-12-16 MED ORDER — DILTIAZEM HCL ER 240 MG PO CP24: 240.0000 mg | ORAL_CAPSULE | Freq: Every day | ORAL | 0 refills | Status: DC

## 2016-12-16 NOTE — Progress Notes (Signed)
Patient: Joyce [name] Female    DOB: 06-23-1928   83 y.o.   MRN: [id number] Visit Date: 12/16/2016  Today's Provider: [name], MD   Chief Complaint  Patient presents with  . Tachycardia    One week follow up  . Anxiety   Subjective:    Anxiety  Presents for follow-up (Pt here for a one week follow up.  She reports a 75% improvement from last week. ) visit. Patient reports no chest pain, confusion, decreased concentration, dizziness, excessive worry, insomnia, irritability, malaise, nervous/anxious behavior, palpitations, panic or suicidal ideas. The quality of sleep is good. Nighttime awakenings: occasional.    Pt is also coming in for a one week follow up for tachycardia.  She started diltiazem 120mg  one daily.  She is tolerating the change well.  Her family has been able to check her pulse at home and they reports 75 yesterday and 107 Friday. She feels much better. Her doctors at home recommended an ECG and she declines today. She is having no chest pain or chest tightness. Her breathing continues to improve.  Allergies  Allergen Reactions  . No Known Allergies     verified     Current Outpatient Prescriptions:  .  acetaminophen (TYLENOL) 325 MG tablet, Take 650 mg by mouth every 6 (six) hours as needed., Disp: , Rfl:  .  acidophilus (RISAQUAD) CAPS capsule, Take 1 capsule by mouth daily., Disp: , Rfl:  .  albuterol (PROVENTIL HFA;VENTOLIN HFA) 108 (90 Base) MCG/ACT inhaler, Inhale into the lungs., Disp: , Rfl:  .  aspirin EC 81 MG tablet, Take 81 mg by mouth daily., Disp: , Rfl:  .  calcium carbonate (TUMS - DOSED IN MG ELEMENTAL CALCIUM) 500 MG chewable tablet, Chew 1 tablet by mouth daily., Disp: , Rfl:  .  cholecalciferol (VITAMIN D) 1000 units tablet, Take 2,000 Units by mouth daily., Disp: , Rfl:  .  diltiazem (DILACOR XR) 120 MG 24 hr capsule, Take 1 capsule (120 mg total) by mouth daily., Disp: 30 capsule, Rfl: 12 .  enalapril (VASOTEC) 20 MG tablet,  Take 20 mg by mouth daily., Disp: , Rfl:  .  feeding supplement, ENSURE ENLIVE, (ENSURE ENLIVE) LIQD, Take 237 mLs by mouth 2 (two) times daily between meals., Disp: 237 mL, Rfl: 12 .  Fluticasone-Salmeterol (ADVAIR) 500-50 MCG/DOSE AEPB, Inhale 1 puff into the lungs 2 (two) times daily., Disp: , Rfl:  .  levothyroxine (SYNTHROID, LEVOTHROID) 150 MCG tablet, Take 150 mcg by mouth daily before breakfast., Disp: , Rfl:  .  LORazepam (ATIVAN) 0.5 MG tablet, Take 0.5 tablets (0.25 mg total) by mouth 3 (three) times daily as needed for anxiety., Disp: 90 tablet, Rfl: 3 .  lovastatin (MEVACOR) 40 MG tablet, Take 40 mg by mouth at bedtime. Take 1/2 tablet daily, Disp: , Rfl:  .  MAGNESIUM-ZINC PO, Take 1 tablet by mouth daily., Disp: , Rfl:  .  Multiple Vitamin (MULTIVITAMIN WITH MINERALS) TABS tablet, Take 1 tablet by mouth daily., Disp: , Rfl:  .  oxybutynin (DITROPAN) 5 MG tablet, Take 5 mg by mouth 3 (three) times daily., Disp: , Rfl:  .  sertraline (ZOLOFT) 100 MG tablet, Take 1 tablet (100 mg total) by mouth daily., Disp: 30 tablet, Rfl: 6  Review of Systems  Constitutional: Negative.  Negative for irritability.  Respiratory: Negative.   Cardiovascular: Positive for leg swelling. Negative for chest pain and palpitations.  Gastrointestinal: Negative.   Musculoskeletal: Negative.  Allergic/Immunologic: Negative.   Neurological: Negative for dizziness, light-headedness and headaches.  Psychiatric/Behavioral: Negative for agitation, behavioral problems, confusion, decreased concentration, dysphoric mood, hallucinations, self-injury, sleep disturbance and suicidal ideas. The patient is not nervous/anxious, does not have insomnia and is not hyperactive.     Social History  Substance Use Topics  . Smoking status: Never Smoker  . Smokeless tobacco: Never Used  . Alcohol use Yes     Comment: occasional beer   Objective:   BP (!) 148/78 (BP Location: Right Arm, Patient Position: Sitting, Cuff  Size: Normal)   Pulse (!) 116   Temp 98.5 F (36.9 C) (Oral)   Resp 20   Wt 126 lb (57.2 kg)   SpO2 94%   BMI 24.61 kg/m   Physical Exam  Constitutional: She is oriented to person, place, and time. She appears well-developed and well-nourished.  Elderly, frail, white female in no acute distress today. She appears better than our previous visit. She is more comfortable today. Less anxious.  Eyes: No scleral icterus.  Neck: No thyromegaly present.  Cardiovascular: Regular rhythm, normal heart sounds and intact distal pulses.  Tachycardia present.   Pulmonary/Chest: Effort normal. She has rhonchi.  Abdominal: Soft.  Musculoskeletal:  Uses a walker to ambulate.  Significant increased thoracic kyphosis.   Lymphadenopathy:    She has no cervical adenopathy.  Neurological: She is alert and oriented to person, place, and time.  Skin: Skin is warm and dry.  Psychiatric: She has a normal mood and affect. Her behavior is normal. Judgment and thought content normal.        Assessment & Plan:      1. Tachycardia Pulse still elevated, but pt reports some improvement.  Will increase Diltiazem to 240mg  daily.  She refused an EKG today secondary to feeling anxious today.  Pt will follow up with Primary when she gets back home in March, or sooner here if needed.   - diltiazem (DILACOR XR) 240 MG 24 hr capsule; Take 1 capsule (240 mg total) by mouth daily.  Dispense: 180 capsule; Refill: 0  2.  Anxiety  Pt reports 75% improvement.  Will follow up as needed.  3.Significant COPD 4.Failure to Thrive Her doctors in [place] will follow patient. Patient seen and examined by Miguel Aschoff, MD, and note scribed by Ashley Royalty, CMA I have done the exam and reviewed the above chart and it is accurate to the best of my knowledge. Development worker, community has been used in this note in any air is in the dictation or transcription are unintentional.   [name], MD  [place]

## 2016-12-19 ENCOUNTER — Encounter: Payer: Self-pay | Admitting: Family Medicine

## 2016-12-24 ENCOUNTER — Telehealth: Payer: Self-pay | Admitting: Family Medicine

## 2016-12-24 NOTE — Telephone Encounter (Signed)
Home Health is requesting a verbal order for her to have physical therapy.  He is requesting to have a reassessment/discharge.  He is requesting one visit for two weeks for every other week (which is really only one move visit)

## 2016-12-24 NOTE — Telephone Encounter (Signed)
ok 

## 2016-12-24 NOTE — Telephone Encounter (Signed)
Done  ED 

## 2016-12-24 NOTE — Telephone Encounter (Signed)
Please review. Thanks!  

## 2017-01-03 DIAGNOSIS — E871 Hypo-osmolality and hyponatremia: Secondary | ICD-10-CM | POA: Diagnosis not present

## 2017-01-17 DIAGNOSIS — E890 Postprocedural hypothyroidism: Secondary | ICD-10-CM | POA: Diagnosis not present

## 2017-01-17 DIAGNOSIS — C73 Malignant neoplasm of thyroid gland: Secondary | ICD-10-CM | POA: Diagnosis not present

## 2017-02-21 DIAGNOSIS — E890 Postprocedural hypothyroidism: Secondary | ICD-10-CM | POA: Diagnosis not present

## 2017-02-21 DIAGNOSIS — I10 Essential (primary) hypertension: Secondary | ICD-10-CM | POA: Diagnosis not present

## 2017-02-25 ENCOUNTER — Other Ambulatory Visit: Payer: Self-pay | Admitting: Family Medicine

## 2017-02-25 DIAGNOSIS — R000 Tachycardia, unspecified: Secondary | ICD-10-CM | POA: Insufficient documentation

## 2017-03-20 DIAGNOSIS — I10 Essential (primary) hypertension: Secondary | ICD-10-CM | POA: Diagnosis not present

## 2017-03-21 DIAGNOSIS — I10 Essential (primary) hypertension: Secondary | ICD-10-CM | POA: Diagnosis not present

## 2017-03-21 DIAGNOSIS — J471 Bronchiectasis with (acute) exacerbation: Secondary | ICD-10-CM | POA: Diagnosis not present

## 2017-04-18 DIAGNOSIS — J189 Pneumonia, unspecified organism: Secondary | ICD-10-CM | POA: Diagnosis not present

## 2017-07-15 DIAGNOSIS — E210 Primary hyperparathyroidism: Secondary | ICD-10-CM | POA: Diagnosis not present

## 2017-07-15 DIAGNOSIS — C73 Malignant neoplasm of thyroid gland: Secondary | ICD-10-CM | POA: Diagnosis not present

## 2017-07-15 DIAGNOSIS — E890 Postprocedural hypothyroidism: Secondary | ICD-10-CM | POA: Diagnosis not present

## 2017-07-25 DIAGNOSIS — I87303 Chronic venous hypertension (idiopathic) without complications of bilateral lower extremity: Secondary | ICD-10-CM | POA: Insufficient documentation

## 2017-07-30 DIAGNOSIS — Z23 Encounter for immunization: Secondary | ICD-10-CM | POA: Diagnosis not present

## 2017-08-25 DIAGNOSIS — E785 Hyperlipidemia, unspecified: Secondary | ICD-10-CM | POA: Diagnosis not present

## 2017-08-25 DIAGNOSIS — I10 Essential (primary) hypertension: Secondary | ICD-10-CM | POA: Diagnosis not present

## 2017-08-27 DIAGNOSIS — I77810 Thoracic aortic ectasia: Secondary | ICD-10-CM | POA: Insufficient documentation

## 2017-08-27 DIAGNOSIS — Z66 Do not resuscitate: Secondary | ICD-10-CM | POA: Insufficient documentation

## 2017-08-27 DIAGNOSIS — Z853 Personal history of malignant neoplasm of breast: Secondary | ICD-10-CM | POA: Insufficient documentation

## 2017-10-14 DIAGNOSIS — I358 Other nonrheumatic aortic valve disorders: Secondary | ICD-10-CM | POA: Insufficient documentation

## 2017-10-29 DIAGNOSIS — E890 Postprocedural hypothyroidism: Secondary | ICD-10-CM | POA: Diagnosis not present

## 2017-10-29 DIAGNOSIS — F411 Generalized anxiety disorder: Secondary | ICD-10-CM | POA: Diagnosis not present

## 2017-10-29 DIAGNOSIS — R609 Edema, unspecified: Secondary | ICD-10-CM | POA: Diagnosis not present

## 2017-10-29 DIAGNOSIS — I10 Essential (primary) hypertension: Secondary | ICD-10-CM | POA: Diagnosis not present

## 2017-10-29 DIAGNOSIS — J479 Bronchiectasis, uncomplicated: Secondary | ICD-10-CM | POA: Diagnosis not present

## 2017-10-29 DIAGNOSIS — R000 Tachycardia, unspecified: Secondary | ICD-10-CM | POA: Diagnosis not present

## 2017-10-29 DIAGNOSIS — R350 Frequency of micturition: Secondary | ICD-10-CM | POA: Diagnosis not present

## 2017-12-09 DIAGNOSIS — E039 Hypothyroidism, unspecified: Secondary | ICD-10-CM | POA: Diagnosis not present

## 2017-12-09 DIAGNOSIS — E785 Hyperlipidemia, unspecified: Secondary | ICD-10-CM | POA: Diagnosis not present

## 2018-01-13 DIAGNOSIS — E210 Primary hyperparathyroidism: Secondary | ICD-10-CM | POA: Diagnosis not present

## 2018-01-13 DIAGNOSIS — E890 Postprocedural hypothyroidism: Secondary | ICD-10-CM | POA: Diagnosis not present

## 2018-01-13 DIAGNOSIS — C73 Malignant neoplasm of thyroid gland: Secondary | ICD-10-CM | POA: Diagnosis not present

## 2018-01-13 DIAGNOSIS — C770 Secondary and unspecified malignant neoplasm of lymph nodes of head, face and neck: Secondary | ICD-10-CM | POA: Diagnosis not present

## 2018-01-26 DIAGNOSIS — Z79899 Other long term (current) drug therapy: Secondary | ICD-10-CM | POA: Diagnosis not present

## 2018-01-26 DIAGNOSIS — Z881 Allergy status to other antibiotic agents status: Secondary | ICD-10-CM | POA: Diagnosis not present

## 2018-01-26 DIAGNOSIS — Z888 Allergy status to other drugs, medicaments and biological substances status: Secondary | ICD-10-CM | POA: Diagnosis not present

## 2018-01-26 DIAGNOSIS — L03115 Cellulitis of right lower limb: Secondary | ICD-10-CM | POA: Diagnosis not present

## 2018-01-26 DIAGNOSIS — Z79891 Long term (current) use of opiate analgesic: Secondary | ICD-10-CM | POA: Diagnosis not present

## 2018-01-26 DIAGNOSIS — E871 Hypo-osmolality and hyponatremia: Secondary | ICD-10-CM | POA: Diagnosis not present

## 2018-01-26 DIAGNOSIS — Z7982 Long term (current) use of aspirin: Secondary | ICD-10-CM | POA: Diagnosis not present

## 2018-03-19 DIAGNOSIS — N8110 Cystocele, unspecified: Secondary | ICD-10-CM | POA: Diagnosis not present

## 2018-03-19 DIAGNOSIS — N200 Calculus of kidney: Secondary | ICD-10-CM | POA: Diagnosis not present

## 2018-03-19 DIAGNOSIS — N3941 Urge incontinence: Secondary | ICD-10-CM | POA: Diagnosis not present

## 2018-03-19 DIAGNOSIS — N368 Other specified disorders of urethra: Secondary | ICD-10-CM | POA: Diagnosis not present

## 2018-03-19 DIAGNOSIS — N3281 Overactive bladder: Secondary | ICD-10-CM | POA: Diagnosis not present

## 2018-03-19 DIAGNOSIS — R8290 Unspecified abnormal findings in urine: Secondary | ICD-10-CM | POA: Diagnosis not present

## 2018-03-19 DIAGNOSIS — R350 Frequency of micturition: Secondary | ICD-10-CM | POA: Diagnosis not present

## 2018-04-07 DIAGNOSIS — N3281 Overactive bladder: Secondary | ICD-10-CM | POA: Diagnosis not present

## 2018-04-07 DIAGNOSIS — N3941 Urge incontinence: Secondary | ICD-10-CM | POA: Diagnosis not present

## 2018-04-14 DIAGNOSIS — N3941 Urge incontinence: Secondary | ICD-10-CM | POA: Diagnosis not present

## 2018-04-14 DIAGNOSIS — N3281 Overactive bladder: Secondary | ICD-10-CM | POA: Diagnosis not present

## 2018-04-14 DIAGNOSIS — E890 Postprocedural hypothyroidism: Secondary | ICD-10-CM | POA: Diagnosis not present

## 2018-04-14 DIAGNOSIS — E210 Primary hyperparathyroidism: Secondary | ICD-10-CM | POA: Diagnosis not present

## 2018-04-14 DIAGNOSIS — C73 Malignant neoplasm of thyroid gland: Secondary | ICD-10-CM | POA: Diagnosis not present

## 2018-04-21 DIAGNOSIS — N3941 Urge incontinence: Secondary | ICD-10-CM | POA: Diagnosis not present

## 2018-04-21 DIAGNOSIS — N3281 Overactive bladder: Secondary | ICD-10-CM | POA: Diagnosis not present

## 2018-04-28 DIAGNOSIS — N3281 Overactive bladder: Secondary | ICD-10-CM | POA: Diagnosis not present

## 2018-05-05 DIAGNOSIS — N3281 Overactive bladder: Secondary | ICD-10-CM | POA: Diagnosis not present

## 2018-05-12 DIAGNOSIS — N3281 Overactive bladder: Secondary | ICD-10-CM | POA: Diagnosis not present

## 2018-05-19 DIAGNOSIS — N3281 Overactive bladder: Secondary | ICD-10-CM | POA: Diagnosis not present

## 2018-05-19 DIAGNOSIS — N3941 Urge incontinence: Secondary | ICD-10-CM | POA: Diagnosis not present

## 2018-05-26 DIAGNOSIS — N3281 Overactive bladder: Secondary | ICD-10-CM | POA: Diagnosis not present

## 2018-06-02 DIAGNOSIS — N3281 Overactive bladder: Secondary | ICD-10-CM | POA: Diagnosis not present

## 2018-06-09 DIAGNOSIS — N3281 Overactive bladder: Secondary | ICD-10-CM | POA: Diagnosis not present

## 2018-06-09 DIAGNOSIS — N3941 Urge incontinence: Secondary | ICD-10-CM | POA: Diagnosis not present

## 2018-06-16 DIAGNOSIS — N3941 Urge incontinence: Secondary | ICD-10-CM | POA: Diagnosis not present

## 2018-06-16 DIAGNOSIS — N3281 Overactive bladder: Secondary | ICD-10-CM | POA: Diagnosis not present

## 2018-06-23 DIAGNOSIS — Z66 Do not resuscitate: Secondary | ICD-10-CM | POA: Diagnosis not present

## 2018-06-23 DIAGNOSIS — J479 Bronchiectasis, uncomplicated: Secondary | ICD-10-CM | POA: Diagnosis not present

## 2018-06-23 DIAGNOSIS — I10 Essential (primary) hypertension: Secondary | ICD-10-CM | POA: Diagnosis not present

## 2018-06-23 DIAGNOSIS — E890 Postprocedural hypothyroidism: Secondary | ICD-10-CM | POA: Diagnosis not present

## 2018-06-23 DIAGNOSIS — R000 Tachycardia, unspecified: Secondary | ICD-10-CM | POA: Diagnosis not present

## 2018-06-23 DIAGNOSIS — I719 Aortic aneurysm of unspecified site, without rupture: Secondary | ICD-10-CM | POA: Diagnosis not present

## 2018-06-23 DIAGNOSIS — Z85850 Personal history of malignant neoplasm of thyroid: Secondary | ICD-10-CM | POA: Diagnosis not present

## 2018-06-23 DIAGNOSIS — J45909 Unspecified asthma, uncomplicated: Secondary | ICD-10-CM | POA: Diagnosis not present

## 2018-06-23 DIAGNOSIS — F411 Generalized anxiety disorder: Secondary | ICD-10-CM | POA: Diagnosis not present

## 2018-06-23 DIAGNOSIS — E785 Hyperlipidemia, unspecified: Secondary | ICD-10-CM | POA: Diagnosis not present

## 2018-06-23 DIAGNOSIS — M810 Age-related osteoporosis without current pathological fracture: Secondary | ICD-10-CM | POA: Diagnosis not present

## 2018-06-23 DIAGNOSIS — N3281 Overactive bladder: Secondary | ICD-10-CM | POA: Diagnosis not present

## 2018-08-06 ENCOUNTER — Ambulatory Visit (INDEPENDENT_AMBULATORY_CARE_PROVIDER_SITE_OTHER): Payer: Medicare Other | Admitting: Family Medicine

## 2018-08-06 VITALS — BP 150/72 | HR 105 | Temp 97.7°F | Resp 18 | Wt 127.0 lb

## 2018-08-06 DIAGNOSIS — N3281 Overactive bladder: Secondary | ICD-10-CM

## 2018-08-06 DIAGNOSIS — E890 Postprocedural hypothyroidism: Secondary | ICD-10-CM

## 2018-08-06 DIAGNOSIS — J45909 Unspecified asthma, uncomplicated: Secondary | ICD-10-CM

## 2018-08-06 DIAGNOSIS — Z23 Encounter for immunization: Secondary | ICD-10-CM

## 2018-08-06 DIAGNOSIS — I10 Essential (primary) hypertension: Secondary | ICD-10-CM | POA: Diagnosis not present

## 2018-08-06 DIAGNOSIS — C73 Malignant neoplasm of thyroid gland: Secondary | ICD-10-CM | POA: Diagnosis not present

## 2018-08-06 DIAGNOSIS — F411 Generalized anxiety disorder: Secondary | ICD-10-CM | POA: Diagnosis not present

## 2018-08-06 MED ORDER — ENALAPRIL MALEATE 20 MG PO TABS: 20.0000 mg | ORAL_TABLET | Freq: Every day | ORAL | 12 refills | Status: DC

## 2018-08-06 MED ORDER — MIRTAZAPINE 7.5 MG PO TABS: 7.5000 mg | ORAL_TABLET | Freq: Every day | ORAL | 12 refills | Status: DC

## 2018-08-06 MED ORDER — FESOTERODINE FUMARATE ER 4 MG PO TB24: 4.0000 mg | ORAL_TABLET | Freq: Every day | ORAL | 12 refills | Status: DC

## 2018-08-06 MED ORDER — ATORVASTATIN CALCIUM 10 MG PO TABS: 10.0000 mg | ORAL_TABLET | Freq: Every day | ORAL | 3 refills | Status: DC

## 2018-08-06 MED ORDER — LEVOTHYROXINE SODIUM 150 MCG PO TABS
150.0000 ug | ORAL_TABLET | Freq: Every day | ORAL | 12 refills | Status: DC
Start: 2018-08-06 — End: 2019-08-26

## 2018-08-06 MED ORDER — DILTIAZEM HCL ER 120 MG PO CP24: 120.0000 mg | ORAL_CAPSULE | Freq: Every day | ORAL | 12 refills | Status: DC

## 2018-08-06 MED ORDER — FLUTICASONE-SALMETEROL 500-50 MCG/DOSE IN AEPB: 1.0000 | INHALATION_SPRAY | Freq: Two times a day (BID) | RESPIRATORY_TRACT | 12 refills | Status: DC

## 2018-08-06 MED ORDER — LORAZEPAM 0.5 MG PO TABS: 0.2500 mg | ORAL_TABLET | Freq: Three times a day (TID) | ORAL | 3 refills | Status: DC | PRN

## 2018-08-06 MED ORDER — ALENDRONATE SODIUM 70 MG PO TABS: 70.0000 mg | ORAL_TABLET | ORAL | 12 refills | Status: DC

## 2018-08-06 NOTE — Progress Notes (Signed)
Joyce [name]  MRN: [id number] DOB: [birth year]  Subjective:  HPI   The patient is an 84 year old female who has just moved to the area to live with her daughter.  Today she is in need of refills on her medications and referrals to an endocrinologist and urologist.   She looks much better than when she was last here in 2018. Patient Active Problem List   Diagnosis Date Noted  . Hyponatremia 11/22/2016  . Osteoporosis 09/24/2016  . 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 Hematuria 07/02/2016  . Mass of hand 06/04/2016  . Ascending aorta dilatation (HCC) 05/15/2016  . Generalized anxiety disorder 10/24/2015  . Postoperative hypothyroidism 12/15/2013  . Papillary carcinoma of thyroid ([place]) 11/26/2013  . Primary hyperparathyroidism ([place]) 04/01/2013  . Osteoarthritis of knee 03/23/2013  . Nephrolithiasis 02/03/2013  . Malignant neoplasm of female breast ([place]) 11/28/2009  . Asthma 02/24/2009  . Bronchiectasis without acute exacerbation ([place]) 02/24/2009  . Arthropathy of hand 06/16/2008  . Hyperlipidemia 11/16/2007  . Essential hypertension 11/16/2007    Past Medical History:  Diagnosis Date  . Anxiety   . Asthma   . Breast cancer ([place])   . Cancer ([place])   . COPD (chronic obstructive pulmonary disease) ([place])   . Enlarged aorta ([place])   . GERD (gastroesophageal reflux disease)   . Hx of skin cancer, basal cell   . Hyperlipemia   . Hypertension   . PUD (peptic ulcer disease)     Social History   Socioeconomic History  . Marital status: Widowed    Spouse name: Not on file  . Number of children: Not on file  . Years of education: Not on file  . Highest education level: Not on file  Occupational History  . Not on file  Social Needs  . Financial resource strain: Not on file  . Food insecurity:    Worry: Not on file    Inability: Not on file  . Transportation needs:    Medical: Not on file    Non-medical: Not on file  Tobacco Use  . Smoking status:  Never Smoker  . Smokeless tobacco: Never Used  Substance and Sexual Activity  . Alcohol use: Yes    Comment: occasional beer  . Drug use: No  . Sexual activity: Never  Lifestyle  . Physical activity:    Days per week: Not on file    Minutes per session: Not on file  . Stress: Not on file  Relationships  . Social connections:    Talks on phone: Not on file    Gets together: Not on file    Attends religious service: Not on file    Active member of club or organization: Not on file    Attends meetings of clubs or organizations: Not on file    Relationship status: Not on file  . Intimate partner violence:    Fear of current or ex partner: Not on file    Emotionally abused: Not on file    Physically abused: Not on file    Forced sexual activity: Not on file  Other Topics Concern  . Not on file  Social History Narrative  . Not on file    Outpatient Encounter Medications as of 08/06/2018  Medication Sig  . acetaminophen (TYLENOL) 325 MG tablet Take 650 mg by mouth every 6 (six) hours as needed.  Marland Kitchen acidophilus (RISAQUAD) CAPS capsule Take 1 capsule by mouth daily.  Marland Kitchen albuterol (PROVENTIL HFA;VENTOLIN HFA)  108 (90 Base) MCG/ACT inhaler Inhale into the lungs.  Marland Kitchen alendronate (FOSAMAX) 70 MG tablet Take 70 mg by mouth once a week. Take with a full glass of water on an empty stomach.  . calcium carbonate (TUMS - DOSED IN MG ELEMENTAL CALCIUM) 500 MG chewable tablet Chew 1 tablet by mouth daily.  . cholecalciferol (VITAMIN D) 1000 units tablet Take 2,000 Units by mouth daily.  Marland Kitchen diltiazem (DILACOR XR) 120 MG 24 hr capsule Take 1 capsule (120 mg total) by mouth daily.  . enalapril (VASOTEC) 20 MG tablet Take 20 mg by mouth daily.  . feeding supplement, ENSURE ENLIVE, (ENSURE ENLIVE) LIQD Take 237 mLs by mouth 2 (two) times daily between meals.  . Fluticasone-Salmeterol (ADVAIR) 500-50 MCG/DOSE AEPB Inhale 1 puff into the lungs 2 (two) times daily.  Marland Kitchen levothyroxine (SYNTHROID, LEVOTHROID)  150 MCG tablet Take 150 mcg by mouth daily before breakfast.  . LORazepam (ATIVAN) 0.5 MG tablet Take 0.5 tablets (0.25 mg total) by mouth 3 (three) times daily as needed for anxiety.  Marland Kitchen MAGNESIUM-ZINC PO Take 1 tablet by mouth daily.  . mirtazapine (REMERON) 7.5 MG tablet Take 7.5 mg by mouth at bedtime.  . Multiple Vitamin (MULTIVITAMIN WITH MINERALS) TABS tablet Take 1 tablet by mouth daily.  . [DISCONTINUED] aspirin EC 81 MG tablet Take 81 mg by mouth daily.  . [DISCONTINUED] diltiazem (DILACOR XR) 240 MG 24 hr capsule Take 1 capsule (240 mg total) by mouth daily.  . [DISCONTINUED] lovastatin (MEVACOR) 40 MG tablet Take 40 mg by mouth at bedtime. Take 1/2 tablet daily  . [DISCONTINUED] oxybutynin (DITROPAN) 5 MG tablet Take 5 mg by mouth 3 (three) times daily.  . [DISCONTINUED] sertraline (ZOLOFT) 100 MG tablet Take 1 tablet (100 mg total) by mouth daily.   No facility-administered encounter medications on file as of 08/06/2018.     Allergies  Allergen Reactions  . No Known Allergies     verified    Review of Systems  Constitutional: Negative for fever and malaise/fatigue.  Eyes: Negative.   Respiratory: Positive for cough (chronic), sputum production, shortness of breath (chronic) and wheezing (chronic). Negative for hemoptysis.   Cardiovascular: Positive for orthopnea and leg swelling (1 ankle). Negative for chest pain, palpitations and claudication.  Gastrointestinal: Negative.   Skin: Negative.   Endo/Heme/Allergies: Negative.   Psychiatric/Behavioral: Negative.     Objective:  BP (!) 150/72 (BP Location: Right Arm, Patient Position: Sitting, Cuff Size: Normal)   Pulse (!) 105   Temp 97.7 F (36.5 C) (Oral)   Resp 18   Wt 127 lb (57.6 kg)   BMI 24.80 kg/m   Physical Exam  Constitutional: She is oriented to person, place, and time and well-developed, well-nourished, and in no distress.  HENT:  Head: Normocephalic and atraumatic.  Right Ear: External ear normal.    Left Ear: External ear normal.  Nose: Nose normal.  Eyes: Conjunctivae are normal. No scleral icterus.  Neck: No thyromegaly present.  Cardiovascular: Normal rate, regular rhythm and normal heart sounds.  Pulmonary/Chest: Effort normal and breath sounds normal.  Abdominal: Soft.  Musculoskeletal: She exhibits no edema.  Neurological: She is alert and oriented to person, place, and time. Gait normal. GCS score is 15.  Skin: Skin is warm and dry.  Psychiatric: Mood, memory, affect and judgment normal.    Assessment and Plan :   1. Generalized anxiety disorder = - LORazepam (ATIVAN) 0.5 MG tablet; Take 0.5 tablets (0.25 mg total) by mouth 3 (three) times  daily as needed for anxiety.  Dispense: 90 tablet; Refill: 3  2. Overactive bladder  - Ambulatory referral to Urology  3. Postoperative hypothyroidism  - Ambulatory referral to Endocrinology  4. Need for influenza vaccination  - Flu vaccine HIGH DOSE PF (Fluzone High dose)  5. Essential hypertension   6. Papillary carcinoma of thyroid ([place])   7. Moderate asthma, unspecified whether complicated, unspecified whether persistent  I have done the exam and reviewed the chart and it is accurate to the best of my knowledge. Development worker, community has been used and  any errors in dictation or transcription are unintentional. Miguel Aschoff M.D. [facility]

## 2018-08-10 ENCOUNTER — Ambulatory Visit (INDEPENDENT_AMBULATORY_CARE_PROVIDER_SITE_OTHER): Payer: Medicare Other | Admitting: Urology

## 2018-08-10 ENCOUNTER — Encounter: Payer: Self-pay | Admitting: Urology

## 2018-08-10 VITALS — BP 155/80 | HR 109 | Ht 60.0 in | Wt 127.0 lb

## 2018-08-10 DIAGNOSIS — N200 Calculus of kidney: Secondary | ICD-10-CM | POA: Diagnosis not present

## 2018-08-10 DIAGNOSIS — N3946 Mixed incontinence: Secondary | ICD-10-CM | POA: Diagnosis not present

## 2018-08-10 DIAGNOSIS — N8111 Cystocele, midline: Secondary | ICD-10-CM

## 2018-08-10 DIAGNOSIS — N952 Postmenopausal atrophic vaginitis: Secondary | ICD-10-CM | POA: Diagnosis not present

## 2018-08-10 DIAGNOSIS — N3281 Overactive bladder: Secondary | ICD-10-CM

## 2018-08-10 LAB — URINALYSIS, COMPLETE
Bilirubin, UA: NEGATIVE
Glucose, UA: NEGATIVE
Ketones, UA: NEGATIVE
Nitrite, UA: POSITIVE — AB
PH UA: 7.5 (ref 5.0–7.5)
PROTEIN UA: NEGATIVE
Specific Gravity, UA: 1.015 (ref 1.005–1.030)
Urobilinogen, Ur: 0.2 mg/dL (ref 0.2–1.0)

## 2018-08-10 LAB — MICROSCOPIC EXAMINATION: EPITHELIAL CELLS (NON RENAL): NONE SEEN /HPF (ref 0–10)

## 2018-08-10 LAB — BLADDER SCAN AMB NON-IMAGING: Scan Result: 0

## 2018-08-10 NOTE — Progress Notes (Signed)
08/10/2018 2:51 PM   Joyce [name] [birth year] [id number]  Referring provider: Katheren Shams [address] [place], [place] [address]  Chief Complaint  Patient presents with  . Over Active Bladder    New Patient    HPI: Patient is a 84 -year-old Caucasian female who is referred to [place] by Dr. Braxton Feathers for OAB with her daughter, Joyce [name].    She is experiencing frequency, nocturia, incontinence x 3 to 4 pads during the day and one pad at night and urinary hesitancy.  Patient denies any gross hematuria, dysuria or suprapubic/flank pain.  Patient denies any fevers, chills, nausea or vomiting.  Her PVR is 0 mL.  Her UA is nitrite positive.    She had been followed by Dr. Tonia Brooms in [facility] for her urinary symptoms.  She has been on anticholinergics, Myrbetriq and PTNS without reaching goals.    She is currently on Toviaz 4 mg daily.  She has been on the [place] for two months.   She feels that she had longer gaps in between bathroom trips.  She states at night she has noticed an improvement with more notice.    She has not had any dry mouth, constipation or memory issues.  She is having slight dry eyes.    She has not had a cystoscopy.    She has a history of nephrolithiasis (staghorn), she does not have a history of GU surgery.    She is post menopausal.   She denies constipation and/or diarrhea.   She is drinking four 12 ounces of water daily.   She is drinking two cups of coffee daily.  She does not drink soda.  She does not drink tea.  She does not drink juices.  She drinks milk.  She does not drink any alcohol.        PMH: Past Medical History:  Diagnosis Date  . Anxiety   . Arthritis   . Asthma   . Asthma   . Breast cancer ([place])   . Cancer ([place])   . COPD (chronic obstructive pulmonary disease) ([place])   . Enlarged aorta ([place])   . GERD (gastroesophageal reflux disease)   . High cholesterol   . Hx of skin cancer, basal cell   .  Hyperlipemia   . Hypertension   . PUD (peptic ulcer disease)   . Thyroid cancer [facility])     Surgical History: Past Surgical History:  Procedure Laterality Date  . APPENDECTOMY    . BREAST SURGERY    . CHOLECYSTECTOMY    . FRACTURE SURGERY    . JOINT REPLACEMENT    . MASTECTOMY    . REPAIR OF PERFORATED ULCER    . REPLACEMENT TOTAL KNEE BILATERAL    . STOMACH SURGERY    . THYROIDECTOMY      Home Medications:  Allergies as of 08/10/2018      Reactions   No Known Allergies    verified      Medication List        Accurate as of 08/10/18  2:51 PM. Always use your most recent med list.          acetaminophen 325 MG tablet Commonly known as:  TYLENOL Take 650 mg by mouth every 6 (six) hours as needed.   acidophilus Caps capsule Take 1 capsule by mouth daily.   albuterol 108 (90 Base) MCG/ACT inhaler Commonly known as:  PROVENTIL HFA;VENTOLIN HFA Inhale into the lungs.   alendronate 70  MG tablet Commonly known as:  FOSAMAX Take 1 tablet (70 mg total) by mouth once a week. Take with a full glass of water on an empty stomach.   atorvastatin 10 MG tablet Commonly known as:  LIPITOR Take 1 tablet (10 mg total) by mouth daily.   calcium carbonate 500 MG chewable tablet Commonly known as:  TUMS - dosed in mg elemental calcium Chew 1 tablet by mouth daily.   cholecalciferol 1000 units tablet Commonly known as:  VITAMIN D Take 2,000 Units by mouth daily.   diltiazem 120 MG 24 hr capsule Commonly known as:  DILACOR XR Take 1 capsule (120 mg total) by mouth daily.   enalapril 20 MG tablet Commonly known as:  VASOTEC Take 1 tablet (20 mg total) by mouth daily.   feeding supplement (ENSURE ENLIVE) Liqd Take 237 mLs by mouth 2 (two) times daily between meals.   fesoterodine 4 MG Tb24 tablet Commonly known as:  TOVIAZ Take 1 tablet (4 mg total) by mouth daily.   Fluticasone-Salmeterol 500-50 MCG/DOSE Aepb Commonly known as:  ADVAIR Inhale 1 puff into the lungs 2  (two) times daily.   levothyroxine 150 MCG tablet Commonly known as:  SYNTHROID, LEVOTHROID Take 1 tablet (150 mcg total) by mouth daily before breakfast.   LORazepam 0.5 MG tablet Commonly known as:  ATIVAN Take 0.5 tablets (0.25 mg total) by mouth 3 (three) times daily as needed for anxiety.   MAGNESIUM-ZINC PO Take 1 tablet by mouth daily.   mirtazapine 7.5 MG tablet Commonly known as:  REMERON Take 1 tablet (7.5 mg total) by mouth at bedtime.   multivitamin with minerals Tabs tablet Take 1 tablet by mouth daily.       Allergies:  Allergies  Allergen Reactions  . No Known Allergies     verified    Family History: Family History  Problem Relation Age of Onset  . Hypertension Father   . Colon cancer Father   . Stroke Mother     Social History:  reports that she has never smoked. She has never used smokeless tobacco. She reports that she drinks about 1.0 standard drinks of alcohol per week. She reports that she does not use drugs.  ROS: UROLOGY Frequent Urination?: Yes Hard to postpone urination?: No Burning/pain with urination?: No Get up at night to urinate?: Yes Leakage of urine?: Yes Urine stream starts and stops?: No Trouble starting stream?: Yes Do you have to strain to urinate?: No Blood in urine?: No Urinary tract infection?: No Sexually transmitted disease?: No Injury to kidneys or bladder?: No Painful intercourse?: No Weak stream?: No Currently pregnant?: No Vaginal bleeding?: No Last menstrual period?: n  Gastrointestinal Nausea?: No Vomiting?: No Indigestion/heartburn?: No Diarrhea?: No Constipation?: No  Constitutional Fever: No Night sweats?: No Weight loss?: No Fatigue?: No  Skin Skin rash/lesions?: No Itching?: No  Eyes Blurred vision?: No Double vision?: No  Ears/Nose/Throat Sore throat?: No Sinus problems?: No  Hematologic/Lymphatic Swollen glands?: No Easy bruising?: No  Cardiovascular Leg swelling?:  No Chest pain?: No  Respiratory Cough?: Yes Shortness of breath?: Yes  Endocrine Excessive thirst?: No  Musculoskeletal Back pain?: No Joint pain?: Yes  Neurological Headaches?: No Dizziness?: No  Psychologic Depression?: No Anxiety?: Yes  Physical Exam: BP (!) 155/80 (BP Location: Left Arm, Patient Position: Sitting, Cuff Size: Normal)   Pulse (!) 109   Ht 5' (1.524 m)   Wt 127 lb (57.6 kg)   BMI 24.80 kg/m   Constitutional: Well nourished. Alert  and oriented, No acute distress. HEENT: [place] AT, moist mucus membranes. Trachea midline, no masses. Cardiovascular: No clubbing, cyanosis, or edema. Respiratory: Normal respiratory effort, no increased work of breathing. GI: Abdomen is soft, non tender, non distended, no abdominal masses. Liver and spleen not palpable.  No hernias appreciated.  Stool sample for occult testing is not indicated.   GU: No CVA tenderness.  No bladder fullness or masses.  Atrophic external genitalia, normal pubic hair distribution, no lesions.  Normal urethral meatus, no lesions, no prolapse, no discharge.   No urethral masses, tenderness and/or tenderness. No bladder fullness, tenderness or masses. Pale vagina mucosa, poor estrogen effect, no discharge, no lesions, poor pelvic support, grade III cystocele and no rectocele noted.  Cervix and uterus are surgically absent.  No adnexal/parametria masses or tenderness noted.  Anus and perineum are without rashes or lesions.    Skin: No rashes, bruises or suspicious lesions. Lymph: No cervical or inguinal adenopathy. Neurologic: Grossly intact, no focal deficits, moving all 4 extremities. Psychiatric: Normal mood and affect.  Laboratory Data: Lab Results  Component Value Date   WBC 13.1 (H) 11/27/2016   HGB 12.8 11/27/2016   HCT 36.4 11/27/2016   MCV 84 11/27/2016   PLT 548 (H) 11/27/2016    Lab Results  Component Value Date   CREATININE 0.47 (L) 11/27/2016    No results found for: PSA  No  results found for: TESTOSTERONE  No results found for: HGBA1C  Lab Results  Component Value Date   TSH 0.50 11/05/2016    No results found for: CHOL, HDL, CHOLHDL, VLDL, LDLCALC  No results found for: AST No results found for: ALT No components found for: ALKALINEPHOPHATASE No components found for: BILIRUBINTOTAL  No results found for: ESTRADIOL  Urinalysis Nitrite positive.  Many bacteria.  See Epic.  We will not send for culture as she is likely colonized.    I have reviewed the labs.   Pertinent Imaging: Results for JEANNI, ALLSHOUSE (MRN [id number]) as of 08/10/2018 14:48  Ref. Range 08/10/2018 13:42  Scan Result Unknown 0   IMPRESSION:Staghorn calculus left kidney. Upper cortical calculations of the left kidney. No ureteral calculi appreciated. Nonobstructive bowel gas pattern. Severe osseous degenerative changes.  Other Result Information  This result has an attachment that is not available.  Result Narrative  EXAM: DX ABDOMEN 1 VIEW  COMPARISON: July 06, 2015  Other Result Information  Doreatha Lew [facility] Generic [id number] - 03/19/2018 10:01 AM CDT EXAM: DX ABDOMEN 1 VIEW  COMPARISON: July 06, 2015   IMPRESSION:  Staghorn calculus left kidney. Upper cortical calculations of the left kidney. No ureteral calculi appreciated. Nonobstructive bowel gas pattern. Severe osseous degenerative changes.   I have independently reviewed the films   Assessment & Plan:    1. Mixed Incontinence Discussed behavioral therapies, bladder training and bladder control strategies Explained to the patient that she most likely has both urge and stress incontinence due to her cystocele and that we may not get her completely dry which she is okay with She would like to try a stronger dose of the Toviaz and I will give her Toviaz 8 mg samples #28 She will return in 3 weeks for OAB questionnaire and PVR  2. Overactive bladder - Bladder Scan (Post Void Residual)  in office  3. Vaginal atrophy Will address when patient returns in 3 weeks  4. Cystocele Not bothersome to patient at this time  5. Staghorn calculus We will continue to monitor  Return in about 3 weeks (around 08/31/2018) for PVR and OAB questionnaire.  These notes generated with voice recognition software. I apologize for typographical errors.  Zara Council, [facility] [address], [place], [place] [address] [phone]

## 2018-08-21 ENCOUNTER — Encounter: Payer: Self-pay | Admitting: Family Medicine

## 2018-08-21 ENCOUNTER — Ambulatory Visit (INDEPENDENT_AMBULATORY_CARE_PROVIDER_SITE_OTHER): Payer: Medicare Other | Admitting: Family Medicine

## 2018-08-21 VITALS — BP 100/70 | HR 118 | Temp 97.7°F | Resp 15 | Wt 122.6 lb

## 2018-08-21 DIAGNOSIS — K529 Noninfective gastroenteritis and colitis, unspecified: Secondary | ICD-10-CM | POA: Diagnosis not present

## 2018-08-21 DIAGNOSIS — E860 Dehydration: Secondary | ICD-10-CM | POA: Diagnosis not present

## 2018-08-21 DIAGNOSIS — R000 Tachycardia, unspecified: Secondary | ICD-10-CM | POA: Diagnosis not present

## 2018-08-21 NOTE — Patient Instructions (Signed)
Food Choices to Help Relieve Diarrhea, Adult  When you have diarrhea, the foods you eat and your eating habits are very important. Choosing the right foods and drinks can help:  · Relieve diarrhea.  · Replace lost fluids and nutrients.  · Prevent dehydration.    What general guidelines should I follow?  Relieving diarrhea  · Choose foods with less than 2 g or .07 oz. of fiber per serving.  · Limit fats to less than 8 tsp (38 g or 1.34 oz.) a day.  · Avoid the following:  ? Foods and beverages sweetened with high-fructose corn syrup, honey, or sugar alcohols such as xylitol, sorbitol, and mannitol.  ? Foods that contain a lot of fat or sugar.  ? Fried, greasy, or spicy foods.  ? High-fiber grains, breads, and cereals.  ? Raw fruits and vegetables.  · Eat foods that are rich in probiotics. These foods include dairy products such as yogurt and fermented milk products. They help increase healthy bacteria in the stomach and intestines (gastrointestinal tract, or GI tract).  · If you have lactose intolerance, avoid dairy products. These may make your diarrhea worse.  · Take medicine to help stop diarrhea (antidiarrheal medicine) only as told by your health care provider.  Replacing nutrients  · Eat small meals or snacks every 3–4 hours.  · Eat bland foods, such as white rice, toast, or baked potato, until your diarrhea starts to get better. Gradually reintroduce nutrient-rich foods as tolerated or as told by your health care provider. This includes:  ? Well-cooked protein foods.  ? Peeled, seeded, and soft-cooked fruits and vegetables.  ? Low-fat dairy products.  · Take vitamin and mineral supplements as told by your health care provider.  Preventing dehydration    · Start by sipping water or a special solution to prevent dehydration (oral rehydration solution, ORS). Urine that is clear or pale yellow means that you are getting enough fluid.  · Try to drink at least 8–10 cups of fluid each day to help replace lost  fluids.  · You may add other liquids in addition to water, such as clear juice or decaffeinated sports drinks, as tolerated or as told by your health care provider.  · Avoid drinks with caffeine, such as coffee, tea, or soft drinks.  · Avoid alcohol.  What foods are recommended?  The items listed may not be a complete list. Talk with your health care provider about what dietary choices are best for you.  Grains  White rice. White, French, or pita breads (fresh or toasted), including plain rolls, buns, or bagels. White pasta. Saltine, soda, or graham crackers. Pretzels. Low-fiber cereal. Cooked cereals made with water (such as cornmeal, farina, or cream cereals). Plain muffins. Matzo. Melba toast. Zwieback.  Vegetables  Potatoes (without the skin). Most well-cooked and canned vegetables without skins or seeds. Tender lettuce.  Fruits  Apple sauce. Fruits canned in juice. Cooked apricots, cherries, grapefruit, peaches, pears, or plums. Fresh bananas and cantaloupe.  Meats and other protein foods  Baked or boiled chicken. Eggs. Tofu. Fish. Seafood. Smooth nut butters. Ground or well-cooked tender beef, ham, veal, lamb, pork, or poultry.  Dairy  Plain yogurt, kefir, and unsweetened liquid yogurt. Lactose-free milk, buttermilk, skim milk, or soy milk. Low-fat or nonfat hard cheese.  Beverages  Water. Low-calorie sports drinks. Fruit juices without pulp. Strained tomato and vegetable juices. Decaffeinated teas. Sugar-free beverages not sweetened with sugar alcohols. Oral rehydration solutions, if approved by your health care   provider.  Seasoning and other foods  Bouillon, broth, or soups made from recommended foods.  What foods are not recommended?  The items listed may not be a complete list. Talk with your health care provider about what dietary choices are best for you.  Grains  Whole grain, whole wheat, bran, or rye breads, rolls, pastas, and crackers. Wild or brown rice. Whole grain or bran cereals. Barley. Oats  and oatmeal. Corn tortillas or taco shells. Granola. Popcorn.  Vegetables  Raw vegetables. Fried vegetables. Cabbage, broccoli, Brussels sprouts, artichokes, baked beans, beet greens, corn, kale, legumes, peas, sweet potatoes, and yams. Potato skins. Cooked spinach and cabbage.  Fruits  Dried fruit, including raisins and dates. Raw fruits. Stewed or dried prunes. Canned fruits with syrup.  Meat and other protein foods  Fried or fatty meats. Deli meats. Chunky nut butters. Nuts and seeds. Beans and lentils. Bacon. Hot dogs. Sausage.  Dairy  High-fat cheeses. Whole milk, chocolate milk, and beverages made with milk, such as milk shakes. Half-and-half. Cream. sour cream. Ice cream.  Beverages  Caffeinated beverages (such as coffee, tea, soda, or energy drinks). Alcoholic beverages. Fruit juices with pulp. Prune juice. Soft drinks sweetened with high-fructose corn syrup or sugar alcohols. High-calorie sports drinks.  Fats and oils  Butter. Cream sauces. Margarine. Salad oils. Plain salad dressings. Olives. Avocados. Mayonnaise.  Sweets and desserts  Sweet rolls, doughnuts, and sweet breads. Sugar-free desserts sweetened with sugar alcohols such as xylitol and sorbitol.  Seasoning and other foods  Honey. Hot sauce. Chili powder. Gravy. Cream-based or milk-based soups. Pancakes and waffles.  Summary  · When you have diarrhea, the foods you eat and your eating habits are very important.  · Make sure you get at least 8–10 cups of fluid each day, or enough to keep your urine clear or pale yellow.  · Eat bland foods and gradually reintroduce healthy, nutrient-rich foods as tolerated, or as told by your health care provider.  · Avoid high-fiber, fried, greasy, or spicy foods.  This information is not intended to replace advice given to you by your health care provider. Make sure you discuss any questions you have with your health care provider.  Document Released: 01/11/2004 Document Revised: 10/18/2016 Document  Reviewed: 10/18/2016  Elsevier Interactive Patient Education © 2018 Elsevier Inc.

## 2018-08-21 NOTE — Progress Notes (Signed)
Patient: Joyce [name] Female    DOB: 1928-06-20   84 y.o.   MRN: [id number] Visit Date: 08/21/2018  Today's Provider: Vernie Murders, PA   Chief Complaint  Patient presents with  . Diarrhea   Subjective:    Diarrhea   This is a new problem. The current episode started yesterday. Episode frequency: patient states that she sat on the toliet yesterday for a hour with upset stomach. The problem has been gradually improving. The stool consistency is described as blood tinged. The patient states that diarrhea does not awaken her from sleep. Associated symptoms include abdominal pain, bloating (last night only), vomiting and weight loss. Pertinent negatives include no arthralgias, chills, coughing, fever, headaches, increased  flatus, myalgias, sweats or URI. Risk factors include ill contacts (patients daughter complained of similar symptoms of G.I upset a day prior to patients symptoms beginning). She has tried nothing for the symptoms.      Past Medical History:  Diagnosis Date  . Anxiety   . Arthritis   . Asthma   . Asthma   . Breast cancer ([place])   . Cancer ([place])   . COPD (chronic obstructive pulmonary disease) ([place])   . Enlarged aorta ([place])   . GERD (gastroesophageal reflux disease)   . High cholesterol   . Hx of skin cancer, basal cell   . Hyperlipemia   . Hypertension   . PUD (peptic ulcer disease)   . Thyroid cancer [facility])    Past Surgical History:  Procedure Laterality Date  . APPENDECTOMY    . BREAST SURGERY    . CHOLECYSTECTOMY    . FRACTURE SURGERY    . JOINT REPLACEMENT    . MASTECTOMY    . REPAIR OF PERFORATED ULCER    . REPLACEMENT TOTAL KNEE BILATERAL    . STOMACH SURGERY    . THYROIDECTOMY     Family History  Problem Relation Age of Onset  . Hypertension Father   . Colon cancer Father   . Stroke Mother    Allergies  Allergen Reactions  . No Known Allergies     verified    Current Outpatient Medications:  .  acetaminophen (TYLENOL) 325 MG  tablet, Take 650 mg by mouth every 6 (six) hours as needed., Disp: , Rfl:  .  acidophilus (RISAQUAD) CAPS capsule, Take 1 capsule by mouth daily., Disp: , Rfl:  .  albuterol (PROVENTIL HFA;VENTOLIN HFA) 108 (90 Base) MCG/ACT inhaler, Inhale into the lungs., Disp: , Rfl:  .  alendronate (FOSAMAX) 70 MG tablet, Take 1 tablet (70 mg total) by mouth once a week. Take with a full glass of water on an empty stomach., Disp: 4 tablet, Rfl: 12 .  atorvastatin (LIPITOR) 10 MG tablet, Take 1 tablet (10 mg total) by mouth daily., Disp: 90 tablet, Rfl: 3 .  calcium carbonate (TUMS - DOSED IN MG ELEMENTAL CALCIUM) 500 MG chewable tablet, Chew 1 tablet by mouth daily., Disp: , Rfl:  .  cholecalciferol (VITAMIN D) 1000 units tablet, Take 2,000 Units by mouth daily., Disp: , Rfl:  .  diltiazem (DILACOR XR) 120 MG 24 hr capsule, Take 1 capsule (120 mg total) by mouth daily., Disp: 30 capsule, Rfl: 12 .  enalapril (VASOTEC) 20 MG tablet, Take 1 tablet (20 mg total) by mouth daily., Disp: 30 tablet, Rfl: 12 .  feeding supplement, ENSURE ENLIVE, (ENSURE ENLIVE) LIQD, Take 237 mLs by mouth 2 (two) times daily between meals., Disp: 237 mL, Rfl: 12 .  fesoterodine (TOVIAZ) 4 MG TB24 tablet, Take 1 tablet (4 mg total) by mouth daily., Disp: 30 tablet, Rfl: 12 .  Fluticasone-Salmeterol (ADVAIR) 500-50 MCG/DOSE AEPB, Inhale 1 puff into the lungs 2 (two) times daily., Disp: 60 each, Rfl: 12 .  levothyroxine (SYNTHROID, LEVOTHROID) 150 MCG tablet, Take 1 tablet (150 mcg total) by mouth daily before breakfast., Disp: 30 tablet, Rfl: 12 .  LORazepam (ATIVAN) 0.5 MG tablet, Take 0.5 tablets (0.25 mg total) by mouth 3 (three) times daily as needed for anxiety., Disp: 90 tablet, Rfl: 3 .  MAGNESIUM-ZINC PO, Take 1 tablet by mouth daily., Disp: , Rfl:  .  mirtazapine (REMERON) 7.5 MG tablet, Take 1 tablet (7.5 mg total) by mouth at bedtime., Disp: 30 tablet, Rfl: 12 .  Multiple Vitamin (MULTIVITAMIN WITH MINERALS) TABS tablet, Take 1  tablet by mouth daily., Disp: , Rfl:   Review of Systems  Constitutional: Positive for weight loss. Negative for chills and fever.  Respiratory: Negative for cough.   Gastrointestinal: Positive for abdominal pain, bloating (last night only), diarrhea and vomiting. Negative for flatus.  Musculoskeletal: Negative for arthralgias and myalgias.  Neurological: Negative for headaches.   Social History   Tobacco Use  . Smoking status: Never Smoker  . Smokeless tobacco: Never Used  Substance Use Topics  . Alcohol use: Yes    Alcohol/week: 1.0 standard drinks    Types: 1 Cans of beer per week    Comment: occasional beer, once a year   Objective:   BP 100/70   Pulse (!) 118   Temp 97.7 F (36.5 C) (Oral)   Resp 15   Wt 122 lb 9.6 oz (55.6 kg)   SpO2 96%   BMI 23.94 kg/m   Wt Readings from Last 3 Encounters:  08/21/18 122 lb 9.6 oz (55.6 kg)  08/10/18 127 lb (57.6 kg)  08/06/18 127 lb (57.6 kg)   Vitals:   08/21/18 1330  BP: 100/70  Pulse: (!) 118  Resp: 15  Temp: 97.7 F (36.5 C)  TempSrc: Oral  SpO2: 96%  Weight: 122 lb 9.6 oz (55.6 kg)   Physical Exam  Constitutional: She is oriented to person, place, and time. She appears well-developed and well-nourished. No distress.  HENT:  Head: Normocephalic and atraumatic.  Right Ear: Hearing normal.  Left Ear: Hearing normal.  Nose: Nose normal.  Eyes: Conjunctivae and lids are normal. Right eye exhibits no discharge. Left eye exhibits no discharge. No scleral icterus.  Cardiovascular:  tachycardia  Pulmonary/Chest: Effort normal. No respiratory distress.  Bubbly rhonchi scattered.  Musculoskeletal: Normal range of motion.  Neurological: She is alert and oriented to person, place, and time.  Skin: Skin is intact. No lesion and no rash noted.  Psychiatric: She has a normal mood and affect. Her speech is normal and behavior is normal. Thought content normal.      Assessment & Plan:     1. Gastroenteritis Onset  yesterday evening with 7 diarrhea stools and vomited once. No fever or melena. Some pink to red spot on tissue (states she has had some of this with the overactive bladder frequency). No hematuria. Some abdominal cramps during the diarrhea episode. Slept well through the night and ate a little breakfast without more vomiting or diarrhea. No change in strength, dizziness or abdominal discomfort today. Will check labs for signs of infection or dehydration. Recommend she get on a bland diet with extra fluid intake. Advised family to take her to the ER if diarrhea returns, any  additional vomiting or confusion. Should be able to rehydrate at home if no further GI symptoms occur. - CBC with Differential/Platelet - Comprehensive metabolic panel  2. Mild dehydration No light headed sensation or dizziness at the present. No further GI upset. Recommend increase in fluid intake and check CBC with CMP. Advised patient and family to watch for symptoms of dehydration worsening and should take her to the ER if confusion or dyspnea occurs. - CBC with Differential/Platelet - Comprehensive metabolic panel  3. Tachycardia, unspecified Has been taking Diltiazem 120 mg 2 tablets each morning and 1 tablet each evening for control of tachycardia. A little worse with episode of gastroenteritis and suspected mild dehydration. No chest pains and feeling better this afternoon. Will check labs to rule out electrolyte imbalance. Denies dizziness or faint sensation. - CBC with Differential/Platelet - Comprehensive metabolic panel       Vernie Murders, PA  [facility]

## 2018-08-22 LAB — COMPREHENSIVE METABOLIC PANEL
A/G RATIO: 1.6 (ref 1.2–2.2)
ALK PHOS: 82 IU/L (ref 39–117)
ALT: 35 IU/L — AB (ref 0–32)
AST: 33 IU/L (ref 0–40)
Albumin: 4.2 g/dL (ref 3.5–4.7)
BILIRUBIN TOTAL: 0.4 mg/dL (ref 0.0–1.2)
BUN/Creatinine Ratio: 26 (ref 12–28)
BUN: 40 mg/dL — ABNORMAL HIGH (ref 8–27)
CHLORIDE: 93 mmol/L — AB (ref 96–106)
CO2: 24 mmol/L (ref 20–29)
Calcium: 9.4 mg/dL (ref 8.7–10.3)
Creatinine, Ser: 1.53 mg/dL — ABNORMAL HIGH (ref 0.57–1.00)
GFR calc Af Amer: 35 mL/min/{1.73_m2} — ABNORMAL LOW (ref >59)
GFR calc non Af Amer: 30 mL/min/{1.73_m2} — ABNORMAL LOW (ref >59)
Globulin, Total: 2.7 g/dL (ref 1.5–4.5)
Glucose: 90 mg/dL (ref 65–99)
POTASSIUM: 4.6 mmol/L (ref 3.5–5.2)
Sodium: 138 mmol/L (ref 134–144)
Total Protein: 6.9 g/dL (ref 6.0–8.5)

## 2018-08-22 LAB — CBC WITH DIFFERENTIAL/PLATELET
BASOS: 0 %
Basophils Absolute: 0.1 10*3/uL (ref 0.0–0.2)
EOS (ABSOLUTE): 0 10*3/uL (ref 0.0–0.4)
EOS: 0 %
Hematocrit: 36.6 % (ref 34.0–46.6)
Hemoglobin: 13 g/dL (ref 11.1–15.9)
IMMATURE GRANULOCYTES: 1 %
Immature Grans (Abs): 0.1 10*3/uL (ref 0.0–0.1)
LYMPHS: 8 %
Lymphocytes Absolute: 1.8 10*3/uL (ref 0.7–3.1)
MCH: 30.7 pg (ref 26.6–33.0)
MCHC: 35.5 g/dL (ref 31.5–35.7)
MCV: 87 fL (ref 79–97)
MONOCYTES: 9 %
MONOS ABS: 1.9 10*3/uL — AB (ref 0.1–0.9)
NEUTROS PCT: 82 %
Neutrophils Absolute: 17.7 10*3/uL — ABNORMAL HIGH (ref 1.4–7.0)
PLATELETS: 324 10*3/uL (ref 150–450)
RBC: 4.23 x10E6/uL (ref 3.77–5.28)
RDW: 12.9 % (ref 12.3–15.4)
WBC: 21.5 10*3/uL (ref 3.4–10.8)

## 2018-08-24 ENCOUNTER — Ambulatory Visit
Admission: RE | Admit: 2018-08-24 | Discharge: 2018-08-24 | Disposition: A | Discharge status: Home / Self Care | Payer: Medicare Other | Source: Ambulatory Visit | Attending: Family Medicine | Admitting: Family Medicine

## 2018-08-24 ENCOUNTER — Other Ambulatory Visit: Payer: Self-pay | Admitting: Family Medicine

## 2018-08-24 DIAGNOSIS — J984 Other disorders of lung: Secondary | ICD-10-CM | POA: Insufficient documentation

## 2018-08-24 DIAGNOSIS — D72829 Elevated white blood cell count, unspecified: Secondary | ICD-10-CM

## 2018-08-24 DIAGNOSIS — J449 Chronic obstructive pulmonary disease, unspecified: Secondary | ICD-10-CM | POA: Diagnosis not present

## 2018-08-24 DIAGNOSIS — R05 Cough: Secondary | ICD-10-CM | POA: Diagnosis not present

## 2018-08-25 ENCOUNTER — Telehealth: Payer: Self-pay

## 2018-08-25 NOTE — Telephone Encounter (Signed)
-----   Message from Margo Common, [place] sent at 08/25/2018  8:32 AM EDT ----- Patchy changes in the left lower lung unchanged from past x-ray. The elevation in WBC count may be due to the viral gastroenteritis and mild dehydration. Should continue working on hydration, schedule follow up appointment and recheck blood count in 2-3 days.

## 2018-08-25 NOTE — Telephone Encounter (Signed)
Pt's [name] (on [place]) stated she was returning call. Please advise. Thanks TNP

## 2018-08-26 ENCOUNTER — Ambulatory Visit (INDEPENDENT_AMBULATORY_CARE_PROVIDER_SITE_OTHER): Payer: Medicare Other | Admitting: Family Medicine

## 2018-08-26 VITALS — BP 100/60 | HR 74 | Temp 98.1°F | Resp 16 | Wt 126.0 lb

## 2018-08-26 DIAGNOSIS — D72829 Elevated white blood cell count, unspecified: Secondary | ICD-10-CM

## 2018-08-26 DIAGNOSIS — E039 Hypothyroidism, unspecified: Secondary | ICD-10-CM | POA: Diagnosis not present

## 2018-08-26 DIAGNOSIS — N189 Chronic kidney disease, unspecified: Secondary | ICD-10-CM

## 2018-08-26 DIAGNOSIS — J189 Pneumonia, unspecified organism: Secondary | ICD-10-CM

## 2018-08-26 DIAGNOSIS — I10 Essential (primary) hypertension: Secondary | ICD-10-CM

## 2018-08-26 NOTE — Telephone Encounter (Signed)
Spoke to daughter cathy and advised xray results and to fup in 2-3 days.  Patient has appointment on 08/26/18 with dr. Rosanna Randy.  dbs

## 2018-08-26 NOTE — Progress Notes (Signed)
Joyce [name]  MRN: [id number] DOB: 10-05-1928  Subjective:  HPI   The patient is an 84 year old female who recently had a GI bug and was seen by the PA.  She had labs done at that time and was noted to have elevated WBC and abnormal kidney function.  She was told to be seen today to have labs repeated.   She is also inquiring about the thyroid referral.  She states that the Endocrinologist at [facility] is unable to see her until at least January.She had thrroidectomy for thyroid cancer. She feels well lately.  Patient Active Problem List   Diagnosis Date Noted  . Other nonrheumatic aortic valve disorders 10/14/2017  . Do not resuscitate status 08/27/2017  . Personal history of malignant neoplasm of breast 08/27/2017  . Chronic venous hypertension (idiopathic) without complications of bilateral lower extremity 07/25/2017  . Tachycardia, unspecified 02/25/2017  . Hyponatremia 11/22/2016  . Osteoporosis 09/24/2016  . 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 Hematuria 07/02/2016  . Mass of hand 06/04/2016  . Other specified soft tissue disorders 06/04/2016  . Ascending aorta dilatation (HCC) 05/15/2016  . Generalized anxiety disorder 10/24/2015  . Postoperative hypothyroidism 12/15/2013  . Papillary carcinoma of thyroid ([place]) 11/26/2013  . Primary hyperparathyroidism ([place]) 04/01/2013  . Osteoarthritis of knee 03/23/2013  . Nephrolithiasis 02/03/2013  . Malignant neoplasm of female breast ([place]) 11/28/2009  . Asthma 02/24/2009  . Bronchiectasis without acute exacerbation ([place]) 02/24/2009  . Arthropathy of hand 06/16/2008  . Hyperlipidemia 11/16/2007  . Essential hypertension 11/16/2007    Past Medical History:  Diagnosis Date  . Anxiety   . Arthritis   . Asthma   . Asthma   . Breast cancer ([place])   . Cancer ([place])   . COPD (chronic obstructive pulmonary disease) ([place])   . Enlarged aorta ([place])   . GERD (gastroesophageal reflux disease)   . High cholesterol   .  Hx of skin cancer, basal cell   . Hyperlipemia   . Hypertension   . PUD (peptic ulcer disease)   . Thyroid cancer [facility])     Social History   Socioeconomic History  . Marital status: Widowed    Spouse name: Not on file  . Number of children: Not on file  . Years of education: Not on file  . Highest education level: Not on file  Occupational History  . Not on file  Social Needs  . Financial resource strain: Not on file  . Food insecurity:    Worry: Not on file    Inability: Not on file  . Transportation needs:    Medical: Not on file    Non-medical: Not on file  Tobacco Use  . Smoking status: Never Smoker  . Smokeless tobacco: Never Used  Substance and Sexual Activity  . Alcohol use: Yes    Alcohol/week: 1.0 standard drinks    Types: 1 Cans of beer per week    Comment: occasional beer, once a year  . Drug use: No  . Sexual activity: Never  Lifestyle  . Physical activity:    Days per week: Not on file    Minutes per session: Not on file  . Stress: Not on file  Relationships  . Social connections:    Talks on phone: Not on file    Gets together: Not on file    Attends religious service: Not on file    Active member of club or organization: Not on file  Attends meetings of clubs or organizations: Not on file    Relationship status: Not on file  . Intimate partner violence:    Fear of current or ex partner: Not on file    Emotionally abused: Not on file    Physically abused: Not on file    Forced sexual activity: Not on file  Other Topics Concern  . Not on file  Social History Narrative  . Not on file    Outpatient Encounter Medications as of 08/26/2018  Medication Sig Note  . acetaminophen (TYLENOL) 325 MG tablet Take 650 mg by mouth every 6 (six) hours as needed.   Marland Kitchen acidophilus (RISAQUAD) CAPS capsule Take 1 capsule by mouth daily.   Marland Kitchen albuterol (PROVENTIL HFA;VENTOLIN HFA) 108 (90 Base) MCG/ACT inhaler Inhale into the lungs.   Marland Kitchen alendronate (FOSAMAX) 70  MG tablet Take 1 tablet (70 mg total) by mouth once a week. Take with a full glass of water on an empty stomach.   Marland Kitchen atorvastatin (LIPITOR) 10 MG tablet Take 1 tablet (10 mg total) by mouth daily.   . calcium carbonate (TUMS - DOSED IN MG ELEMENTAL CALCIUM) 500 MG chewable tablet Chew 1 tablet by mouth daily.   . cholecalciferol (VITAMIN D) 1000 units tablet Take 2,000 Units by mouth daily.   Marland Kitchen diltiazem (DILACOR XR) 120 MG 24 hr capsule Take 1 capsule (120 mg total) by mouth daily.   . enalapril (VASOTEC) 20 MG tablet Take 1 tablet (20 mg total) by mouth daily.   . feeding supplement, ENSURE ENLIVE, (ENSURE ENLIVE) LIQD Take 237 mLs by mouth 2 (two) times daily between meals.   . fesoterodine (TOVIAZ) 4 MG TB24 tablet Take 1 tablet (4 mg total) by mouth daily. (Patient taking differently: Take 8 mg by mouth daily. ) 08/21/2018: Patients states that medication was increased to 8mg  once daily  . Fluticasone-Salmeterol (ADVAIR) 500-50 MCG/DOSE AEPB Inhale 1 puff into the lungs 2 (two) times daily.   Marland Kitchen levothyroxine (SYNTHROID, LEVOTHROID) 150 MCG tablet Take 1 tablet (150 mcg total) by mouth daily before breakfast.   . LORazepam (ATIVAN) 0.5 MG tablet Take 0.5 tablets (0.25 mg total) by mouth 3 (three) times daily as needed for anxiety.   Marland Kitchen MAGNESIUM-ZINC PO Take 1 tablet by mouth daily.   . mirtazapine (REMERON) 7.5 MG tablet Take 1 tablet (7.5 mg total) by mouth at bedtime.   . Multiple Vitamin (MULTIVITAMIN WITH MINERALS) TABS tablet Take 1 tablet by mouth daily.    No facility-administered encounter medications on file as of 08/26/2018.     Allergies  Allergen Reactions  . No Known Allergies     verified    Review of Systems  Constitutional: Negative for fever and malaise/fatigue.  HENT: Negative.   Eyes: Negative.   Respiratory: Negative for cough, shortness of breath and wheezing.   Cardiovascular: Negative for chest pain, palpitations and leg swelling (ankles).  Gastrointestinal:  Negative for abdominal pain.  Genitourinary: Negative.   Musculoskeletal: Positive for joint pain.  Skin: Negative.   Endo/Heme/Allergies: Negative.   Psychiatric/Behavioral: Negative.     Objective:  BP 100/60 (BP Location: Right Arm, Patient Position: Sitting, Cuff Size: Normal)   Pulse 74   Temp 98.1 F (36.7 C) (Oral)   Resp 16   Wt 126 lb (57.2 kg)   SpO2 93%   BMI 24.61 kg/m   Physical Exam  Constitutional: She is oriented to person, place, and time and well-developed, well-nourished, and in no distress.  HENT:  Head: Normocephalic and atraumatic.  Eyes: Conjunctivae are normal.  Cardiovascular: Normal rate, regular rhythm and normal heart sounds.  Pulmonary/Chest: Effort normal and breath sounds normal.  Abdominal: Soft.  Musculoskeletal: She exhibits no edema.  Lymphadenopathy:    She has no cervical adenopathy.  Neurological: She is alert and oriented to person, place, and time. Gait normal. GCS score is 15.  Skin: Skin is warm and dry.  Psychiatric: Mood, memory, affect and judgment normal.    Assessment and Plan :  1. Pneumonia due to infectious organism, unspecified laterality, unspecified part of lung Clinically resolved--repeat CXR.  2. Chronic kidney disease, unspecified CKD stage Probably due to dehydration. - Comprehensive metabolic panel  3. Hypothyroidism, unspecified type Pt s/p thyroidectomy for thyroid cancer. - T4 AND TSH  4. Leukocytosis, unspecified type Probably due to CAP. - CBC with Differential/Platelet  5. Essential hypertension - Comprehensive metabolic panel  I have done the exam and reviewed the chart and it is accurate to the best of my knowledge. Development worker, community has been used and  any errors in dictation or transcription are unintentional. Miguel Aschoff M.D. [facility]

## 2018-08-27 DIAGNOSIS — I10 Essential (primary) hypertension: Secondary | ICD-10-CM | POA: Diagnosis not present

## 2018-08-27 DIAGNOSIS — D72829 Elevated white blood cell count, unspecified: Secondary | ICD-10-CM | POA: Diagnosis not present

## 2018-08-27 DIAGNOSIS — N189 Chronic kidney disease, unspecified: Secondary | ICD-10-CM | POA: Diagnosis not present

## 2018-08-27 DIAGNOSIS — E039 Hypothyroidism, unspecified: Secondary | ICD-10-CM | POA: Diagnosis not present

## 2018-08-28 LAB — CBC WITH DIFFERENTIAL/PLATELET
Basophils Absolute: 0.1 10*3/uL (ref 0.0–0.2)
Basos: 1 %
EOS (ABSOLUTE): 0.2 10*3/uL (ref 0.0–0.4)
Eos: 2 %
HEMATOCRIT: 36.8 % (ref 34.0–46.6)
HEMOGLOBIN: 12.4 g/dL (ref 11.1–15.9)
Immature Grans (Abs): 0.1 10*3/uL (ref 0.0–0.1)
Immature Granulocytes: 1 %
LYMPHS ABS: 1.7 10*3/uL (ref 0.7–3.1)
Lymphs: 18 %
MCH: 30.7 pg (ref 26.6–33.0)
MCHC: 33.7 g/dL (ref 31.5–35.7)
MCV: 91 fL (ref 79–97)
MONOCYTES: 10 %
Monocytes Absolute: 1 10*3/uL — ABNORMAL HIGH (ref 0.1–0.9)
Neutrophils Absolute: 6.5 10*3/uL (ref 1.4–7.0)
Neutrophils: 68 %
Platelets: 267 10*3/uL (ref 150–450)
RBC: 4.04 x10E6/uL (ref 3.77–5.28)
RDW: 12.4 % (ref 12.3–15.4)
WBC: 9.3 10*3/uL (ref 3.4–10.8)

## 2018-08-28 LAB — COMPREHENSIVE METABOLIC PANEL
ALBUMIN: 4 g/dL (ref 3.5–4.7)
ALK PHOS: 77 IU/L (ref 39–117)
ALT: 19 IU/L (ref 0–32)
AST: 19 IU/L (ref 0–40)
Albumin/Globulin Ratio: 1.5 (ref 1.2–2.2)
BILIRUBIN TOTAL: 0.2 mg/dL (ref 0.0–1.2)
BUN / CREAT RATIO: 32 — AB (ref 12–28)
BUN: 30 mg/dL — ABNORMAL HIGH (ref 8–27)
CHLORIDE: 92 mmol/L — AB (ref 96–106)
CO2: 27 mmol/L (ref 20–29)
CREATININE: 0.94 mg/dL (ref 0.57–1.00)
Calcium: 9.6 mg/dL (ref 8.7–10.3)
GFR calc Af Amer: 63 mL/min/{1.73_m2} (ref >59)
GFR calc non Af Amer: 54 mL/min/{1.73_m2} — ABNORMAL LOW (ref >59)
GLOBULIN, TOTAL: 2.7 g/dL (ref 1.5–4.5)
Glucose: 83 mg/dL (ref 65–99)
Potassium: 4.8 mmol/L (ref 3.5–5.2)
SODIUM: 136 mmol/L (ref 134–144)
Total Protein: 6.7 g/dL (ref 6.0–8.5)

## 2018-08-28 LAB — T4 AND TSH
T4, Total: 9.9 ug/dL (ref 4.5–12.0)
TSH: 1.93 u[IU]/mL (ref 0.450–4.500)

## 2018-09-01 ENCOUNTER — Encounter: Payer: Self-pay | Admitting: Urology

## 2018-09-01 ENCOUNTER — Ambulatory Visit (INDEPENDENT_AMBULATORY_CARE_PROVIDER_SITE_OTHER): Payer: Medicare Other | Admitting: Urology

## 2018-09-01 VITALS — BP 129/74 | HR 108 | Ht 60.0 in | Wt 126.7 lb

## 2018-09-01 DIAGNOSIS — N3281 Overactive bladder: Secondary | ICD-10-CM | POA: Diagnosis not present

## 2018-09-01 DIAGNOSIS — N3946 Mixed incontinence: Secondary | ICD-10-CM

## 2018-09-01 LAB — BLADDER SCAN AMB NON-IMAGING: Scan Result: 30

## 2018-09-01 MED ORDER — FESOTERODINE FUMARATE ER 8 MG PO TB24: 8.0000 mg | ORAL_TABLET | Freq: Every day | ORAL | 3 refills | Status: DC

## 2018-09-01 NOTE — Progress Notes (Signed)
09/01/2018 11:16 AM   Joyce [name] 10/31/1928 [id number]  Referring provider: Jerrol Banana., MD [address] [place], [place] [address]  Chief Complaint  Patient presents with  . Follow-up    HPI: Patient is a 84 -year-old Caucasian female with mixed incontinence, OAB, vaginal atrophy, cystocele and staghorn who presents today for a 3 week follow up after a trial of Toviaz 8 mg daily    Background history Referred to [place] by Dr. Braxton Feathers for OAB with her daughter, [name].  She is experiencing frequency, nocturia, incontinence x 3 to 4 pads during the day and one pad at night and urinary hesitancy.  Patient denies any gross hematuria, dysuria or suprapubic/flank pain.  Patient denies any fevers, chills, nausea or vomiting.  Her PVR is 0 mL.  Her UA is nitrite positive.  She had been followed by Dr. Tonia Brooms in [facility] for her urinary symptoms.  She has been on anticholinergics, Myrbetriq and PTNS without reaching goals.  She is currently on Toviaz 4 mg daily.  She has been on the [place] for two months.   She feels that she had longer gaps in between bathroom trips.  She states at night she has noticed an improvement with more notice.  She has not had any dry mouth, constipation or memory issues.  She is having slight dry eyes.  She has not had a cystoscopy.  She has a history of nephrolithiasis (staghorn), she does not have a history of GU surgery.  She is post menopausal.  She denies constipation and/or diarrhea.  She is drinking four 12 ounces of water daily.   She is drinking two cups of coffee daily.  She does not drink soda.  She does not drink tea.  She does not drink juices.  She drinks milk.  She does not drink any alcohol.     The patient has been experiencing urgency x 0-3, frequency x 8 or more, not restricting fluids to avoid visits to the restroom, is engaging in toilet mapping, incontinence x 8 or more and nocturia x 0-3.   She is complaining  of frequency, urgency, nocturia and incontinence.   PVR is 30 mL.    She is states she is at goal with Toviaz 8 mg daily.  She has had some mild dry mouth and mild constipation.  She has not noted any mental cognitive changes.   PMH: Past Medical History:  Diagnosis Date  . Anxiety   . Arthritis   . Asthma   . Asthma   . Breast cancer ([place])   . Cancer ([place])   . COPD (chronic obstructive pulmonary disease) ([place])   . Enlarged aorta ([place])   . GERD (gastroesophageal reflux disease)   . High cholesterol   . Hx of skin cancer, basal cell   . Hyperlipemia   . Hypertension   . PUD (peptic ulcer disease)   . Thyroid cancer [facility])     Surgical History: Past Surgical History:  Procedure Laterality Date  . APPENDECTOMY    . BREAST SURGERY    . CHOLECYSTECTOMY    . FRACTURE SURGERY    . JOINT REPLACEMENT    . MASTECTOMY    . REPAIR OF PERFORATED ULCER    . REPLACEMENT TOTAL KNEE BILATERAL    . STOMACH SURGERY    . THYROIDECTOMY      Home Medications:  Allergies as of 09/01/2018      Reactions   No  Known Allergies    verified      Medication List        Accurate as of 09/01/18 11:16 AM. Always use your most recent med list.          acetaminophen 325 MG tablet Commonly known as:  TYLENOL Take 650 mg by mouth every 6 (six) hours as needed.   acidophilus Caps capsule Take 1 capsule by mouth daily.   albuterol 108 (90 Base) MCG/ACT inhaler Commonly known as:  PROVENTIL HFA;VENTOLIN HFA Inhale into the lungs.   alendronate 70 MG tablet Commonly known as:  FOSAMAX Take 1 tablet (70 mg total) by mouth once a week. Take with a full glass of water on an empty stomach.   atorvastatin 10 MG tablet Commonly known as:  LIPITOR Take 1 tablet (10 mg total) by mouth daily.   calcium carbonate 500 MG chewable tablet Commonly known as:  TUMS - dosed in mg elemental calcium Chew 1 tablet by mouth daily.   cholecalciferol 1000 units tablet Commonly known as:  VITAMIN  D Take 2,000 Units by mouth daily.   diltiazem 120 MG 24 hr capsule Commonly known as:  DILACOR XR Take 1 capsule (120 mg total) by mouth daily.   enalapril 20 MG tablet Commonly known as:  VASOTEC Take 1 tablet (20 mg total) by mouth daily.   feeding supplement (ENSURE ENLIVE) Liqd Take 237 mLs by mouth 2 (two) times daily between meals.   fesoterodine 8 MG Tb24 tablet Commonly known as:  TOVIAZ Take 1 tablet (8 mg total) by mouth daily.   Fluticasone-Salmeterol 500-50 MCG/DOSE Aepb Commonly known as:  ADVAIR Inhale 1 puff into the lungs 2 (two) times daily.   levothyroxine 150 MCG tablet Commonly known as:  SYNTHROID, LEVOTHROID Take 1 tablet (150 mcg total) by mouth daily before breakfast.   LORazepam 0.5 MG tablet Commonly known as:  ATIVAN Take 0.5 tablets (0.25 mg total) by mouth 3 (three) times daily as needed for anxiety.   MAGNESIUM-ZINC PO Take 1 tablet by mouth daily.   mirtazapine 7.5 MG tablet Commonly known as:  REMERON Take 1 tablet (7.5 mg total) by mouth at bedtime.   multivitamin with minerals Tabs tablet Take 1 tablet by mouth daily.       Allergies:  Allergies  Allergen Reactions  . No Known Allergies     verified    Family History: Family History  Problem Relation Age of Onset  . Hypertension Father   . Colon cancer Father   . Stroke Mother     Social History:  reports that she has never smoked. She has never used smokeless tobacco. She reports that she drinks about 1.0 standard drinks of alcohol per week. She reports that she does not use drugs.  ROS: UROLOGY Frequent Urination?: Yes Hard to postpone urination?: Yes Burning/pain with urination?: No Get up at night to urinate?: Yes Leakage of urine?: Yes Urine stream starts and stops?: No Trouble starting stream?: No Do you have to strain to urinate?: No Blood in urine?: No Urinary tract infection?: No Sexually transmitted disease?: No Injury to kidneys or bladder?:  No Painful intercourse?: No Weak stream?: No Currently pregnant?: No Vaginal bleeding?: No Last menstrual period?: n  Gastrointestinal Nausea?: No Vomiting?: No Indigestion/heartburn?: No Diarrhea?: No Constipation?: No  Constitutional Fever: No Night sweats?: No Weight loss?: No Fatigue?: No  Skin Skin rash/lesions?: No Itching?: No  Eyes Blurred vision?: No Double vision?: No  Ears/Nose/Throat Sore throat?: No Sinus  problems?: No  Hematologic/Lymphatic Swollen glands?: No Easy bruising?: No  Cardiovascular Leg swelling?: No Chest pain?: No  Respiratory Cough?: Yes Shortness of breath?: No  Endocrine Excessive thirst?: No  Musculoskeletal Back pain?: No Joint pain?: No  Neurological Headaches?: No Dizziness?: No  Psychologic Depression?: No Anxiety?: No  Physical Exam: BP 129/74   Pulse (!) 108   Ht 5' (1.524 m)   Wt 126 lb 11.2 oz (57.5 kg)   BMI 24.74 kg/m   Constitutional: Well nourished. Alert and oriented, No acute distress. HEENT: White AT, moist mucus membranes. Trachea midline, no masses. Cardiovascular: No clubbing, cyanosis, or edema. Respiratory: Normal respiratory effort, no increased work of breathing. Skin: No rashes, bruises or suspicious lesions. Lymph: No cervical or inguinal adenopathy. Neurologic: Grossly intact, no focal deficits, moving all 4 extremities. Psychiatric: Normal mood and affect.  Laboratory Data: Lab Results  Component Value Date   WBC 9.3 08/27/2018   HGB 12.4 08/27/2018   HCT 36.8 08/27/2018   MCV 91 08/27/2018   PLT 267 08/27/2018    Lab Results  Component Value Date   CREATININE 0.94 08/27/2018    No results found for: PSA  No results found for: TESTOSTERONE  No results found for: HGBA1C  Lab Results  Component Value Date   TSH 1.930 08/27/2018    No results found for: CHOL, HDL, CHOLHDL, VLDL, LDLCALC  Lab Results  Component Value Date   AST 19 08/27/2018   Lab Results   Component Value Date   ALT 19 08/27/2018   No components found for: ALKALINEPHOPHATASE No components found for: BILIRUBINTOTAL  No results found for: ESTRADIOL  I have reviewed the labs.   Pertinent Imaging: Results for SHAIANNE, NUCCI (MRN [id number]) as of 09/01/2018 11:21  Ref. Range 09/01/2018 10:49  Scan Result Unknown 30    I have independently reviewed the films   Assessment & Plan:    1. Mixed Incontinence She feels that she is at goal with Toviaz 8 mg would like to continue; refills sent to pharmacy She will return in 3 months for OAB questionnaire and PVR  2. Overactive bladder - Bladder Scan (Post Void Residual) in office - See above  3. Vaginal atrophy Will address when patient returns in 3 weeks  4. Cystocele Not bothersome to patient at this time  5. Staghorn calculus We will continue to monitor need X-ray in 03/2019   Return in about 3 months (around 12/02/2018) for PVR and OAB questionnaire.  These notes generated with voice recognition software. I apologize for typographical errors.  Zara Council, PA-C  [facility] [address] [place], [place] [address] [phone]

## 2018-09-07 ENCOUNTER — Telehealth: Payer: Self-pay | Admitting: Family Medicine

## 2018-09-07 ENCOUNTER — Other Ambulatory Visit: Payer: Self-pay

## 2018-09-07 MED ORDER — FLUTICASONE-SALMETEROL 250-50 MCG/DOSE IN AEPB: 1.0000 | INHALATION_SPRAY | Freq: Two times a day (BID) | RESPIRATORY_TRACT | 3 refills | Status: DC

## 2018-09-07 NOTE — Telephone Encounter (Signed)
250/50 please.

## 2018-09-07 NOTE — Telephone Encounter (Signed)
Per her MD note from the [facility] clinic she was on 250/50.  Which do you want her on?

## 2018-09-07 NOTE — Telephone Encounter (Signed)
Pt's daughter needing to discuss the double dosage/increase of Fluticasone-Salmeterol (ADVAIR) 500-50 MCG/DOSE AEPB. Needing to know if the increase was supposed to be taken.  Wasn't told of increase.  Please advise.  Thanks, [employer]

## 2018-09-15 ENCOUNTER — Telehealth: Payer: Self-pay | Admitting: Family Medicine

## 2018-09-15 NOTE — Telephone Encounter (Signed)
I spoke with the daughter and she was saying that on 2 different occasions since her last visit, she had vomiting.  She states she complained that her stomach did not feel well and then vomited.  The last episode was last night.  She vomited a few time but then this morning she feels fine.  The daughter said she felt fine after vomiting last time.  No fever or pain.  When asked what she had to eat she said last night was orange juice, toast and boost but at lunch it was lasagna.  I talked to her about keeping up with what she eats on the days she vomits and see if there is a food pattern.  She said she would and then right before hanging up she said, 'Oh and they increased her Lisbeth Ply and the doctor said that it could cause symptoms like this'.  Do you want her to make any changes with the medicine?

## 2018-09-15 NOTE — Telephone Encounter (Signed)
Pt saw Simona Huh and followed up with Dr. Rosanna Randy for bout of diarrhea and vomiting.  Pt seemed fine and the has vomited 2 times since at random times. Daughter - [name] is wanting a call back from a nurse to discuss.  Please advise.  Thanks, [employer]

## 2018-09-16 DIAGNOSIS — E890 Postprocedural hypothyroidism: Secondary | ICD-10-CM | POA: Diagnosis not present

## 2018-09-16 MED ORDER — DILTIAZEM HCL ER 120 MG PO CP24: ORAL_CAPSULE | ORAL | 3 refills | Status: DC

## 2018-09-16 NOTE — Telephone Encounter (Signed)
If she is still having symptoms Joyce [name] is a quality of life medicine helping urine issues. If ok then no changes.

## 2018-09-16 NOTE — Telephone Encounter (Signed)
Advised daughter as below. She is also requesting another refill on diazepam. She reports that she takes it 2 capsules in the am 1 in the pm, and we had it written once daily. Re-written and signed by Dr. Rosanna Randy.

## 2018-10-21 ENCOUNTER — Ambulatory Visit: Payer: Self-pay | Admitting: Family Medicine

## 2018-12-01 ENCOUNTER — Ambulatory Visit: Payer: Medicare Other | Admitting: Urology

## 2018-12-10 ENCOUNTER — Other Ambulatory Visit: Payer: Self-pay

## 2018-12-10 MED ORDER — OSELTAMIVIR PHOSPHATE 75 MG PO CAPS: 75.0000 mg | ORAL_CAPSULE | Freq: Every day | ORAL | 0 refills | Status: DC

## 2018-12-10 NOTE — Progress Notes (Signed)
Ok to order per Dr. Gilbert.  

## 2018-12-17 ENCOUNTER — Encounter: Payer: Self-pay | Admitting: Family Medicine

## 2018-12-17 ENCOUNTER — Other Ambulatory Visit: Payer: Self-pay

## 2018-12-17 ENCOUNTER — Ambulatory Visit (INDEPENDENT_AMBULATORY_CARE_PROVIDER_SITE_OTHER): Payer: Medicare Other | Admitting: Family Medicine

## 2018-12-17 VITALS — BP 86/70 | HR 108 | Temp 98.8°F | Ht 60.0 in | Wt 121.6 lb

## 2018-12-17 DIAGNOSIS — J479 Bronchiectasis, uncomplicated: Secondary | ICD-10-CM | POA: Diagnosis not present

## 2018-12-17 DIAGNOSIS — I10 Essential (primary) hypertension: Secondary | ICD-10-CM

## 2018-12-17 DIAGNOSIS — Z66 Do not resuscitate: Secondary | ICD-10-CM

## 2018-12-17 DIAGNOSIS — J441 Chronic obstructive pulmonary disease with (acute) exacerbation: Secondary | ICD-10-CM | POA: Diagnosis not present

## 2018-12-17 MED ORDER — DOXYCYCLINE HYCLATE 100 MG PO TABS: 100.0000 mg | ORAL_TABLET | Freq: Two times a day (BID) | ORAL | 0 refills | Status: AC

## 2018-12-17 NOTE — Progress Notes (Signed)
Patient: Joyce [name] Female    DOB: 01-31-1928   85 y.o.   MRN: [id number] Visit Date: 12/17/2018  Today's Provider: [name], MD   Chief Complaint  Patient presents with  . Cough    with yellow mucuc, congestion   Subjective:     Cough  This is a recurrent problem. The current episode started in the past 7 days. The problem has been gradually worsening. The problem occurs constantly. The cough is productive of sputum. Nothing aggravates the symptoms. She has tried ipratropium inhaler for the symptoms. Her past medical history is significant for bronchitis (chronic ).   No SOB or fevers.No PND or orthopnea.   Allergies  Allergen Reactions  . No Known Allergies     verified     Current Outpatient Medications:  .  acetaminophen (TYLENOL) 325 MG tablet, Take 650 mg by mouth every 6 (six) hours as needed., Disp: , Rfl:  .  acidophilus (RISAQUAD) CAPS capsule, Take 1 capsule by mouth daily., Disp: , Rfl:  .  albuterol (PROVENTIL HFA;VENTOLIN HFA) 108 (90 Base) MCG/ACT inhaler, Inhale into the lungs., Disp: , Rfl:  .  alendronate (FOSAMAX) 70 MG tablet, Take 1 tablet (70 mg total) by mouth once a week. Take with a full glass of water on an empty stomach., Disp: 4 tablet, Rfl: 12 .  atorvastatin (LIPITOR) 10 MG tablet, Take 1 tablet (10 mg total) by mouth daily., Disp: 90 tablet, Rfl: 3 .  calcium carbonate (TUMS - DOSED IN MG ELEMENTAL CALCIUM) 500 MG chewable tablet, Chew 1 tablet by mouth daily., Disp: , Rfl:  .  cholecalciferol (VITAMIN D) 1000 units tablet, Take 2,000 Units by mouth daily., Disp: , Rfl:  .  diltiazem (DILACOR XR) 120 MG 24 hr capsule, Take 2 capsules by mouth every morning, and 1 capsule every evening., Disp: 270 capsule, Rfl: 3 .  enalapril (VASOTEC) 20 MG tablet, Take 1 tablet (20 mg total) by mouth daily., Disp: 30 tablet, Rfl: 12 .  feeding supplement, ENSURE ENLIVE, (ENSURE ENLIVE) LIQD, Take 237 mLs by mouth 2 (two) times daily between  meals., Disp: 237 mL, Rfl: 12 .  fesoterodine (TOVIAZ) 8 MG TB24 tablet, Take 1 tablet (8 mg total) by mouth daily., Disp: 90 tablet, Rfl: 3 .  Fluticasone-Salmeterol (ADVAIR DISKUS) 250-50 MCG/DOSE AEPB, Inhale 1 puff into the lungs 2 (two) times daily., Disp: 1 each, Rfl: 3 .  levothyroxine (SYNTHROID, LEVOTHROID) 150 MCG tablet, Take 1 tablet (150 mcg total) by mouth daily before breakfast., Disp: 30 tablet, Rfl: 12 .  LORazepam (ATIVAN) 0.5 MG tablet, Take 0.5 tablets (0.25 mg total) by mouth 3 (three) times daily as needed for anxiety., Disp: 90 tablet, Rfl: 3 .  MAGNESIUM-ZINC PO, Take 1 tablet by mouth daily., Disp: , Rfl:  .  mirtazapine (REMERON) 7.5 MG tablet, Take 1 tablet (7.5 mg total) by mouth at bedtime., Disp: 30 tablet, Rfl: 12 .  Multiple Vitamin (MULTIVITAMIN WITH MINERALS) TABS tablet, Take 1 tablet by mouth daily., Disp: , Rfl:  .  doxycycline (VIBRA-TABS) 100 MG tablet, Take 1 tablet (100 mg total) by mouth 2 (two) times daily for 7 days., Disp: 14 tablet, Rfl: 0 .  oseltamivir (TAMIFLU) 75 MG capsule, Take 1 capsule (75 mg total) by mouth daily. (Patient not taking: Reported on 12/17/2018), Disp: 5 capsule, Rfl: 0  Review of Systems  Constitutional: Negative.   HENT: Positive for congestion.   Eyes: Negative.   Respiratory:  Positive for cough (yellow mucus since 12/13/18).   Cardiovascular: Negative.   Gastrointestinal: Negative.   Endocrine: Negative.   Genitourinary: Negative.   Musculoskeletal: Negative.   Skin: Negative.   Allergic/Immunologic: Negative.   Neurological: Negative.   Hematological: Negative.   Psychiatric/Behavioral: Negative.  Negative for sleep disturbance.    Social History   Tobacco Use  . Smoking status: Never Smoker  . Smokeless tobacco: Never Used  Substance Use Topics  . Alcohol use: Yes    Alcohol/week: 1.0 standard drinks    Types: 1 Cans of beer per week    Comment: occasional beer, once a year      Objective:   BP (!) 86/70  (BP Location: Right Arm, Patient Position: Sitting, Cuff Size: Normal)   Pulse (!) 108   Temp 98.8 F (37.1 C) (Oral)   Ht 5' (1.524 m)   Wt 121 lb 9.6 oz (55.2 kg)   SpO2 92%   BMI 23.75 kg/m  Vitals:   12/17/18 1457  BP: (!) 86/70  Pulse: (!) 108  Temp: 98.8 F (37.1 C)  TempSrc: Oral  SpO2: 92%  Weight: 121 lb 9.6 oz (55.2 kg)  Height: 5' (1.524 m)     Physical Exam Vitals signs reviewed.  Constitutional:      Appearance: Normal appearance.  HENT:     Head: Normocephalic and atraumatic.     Right Ear: External ear normal.     Left Ear: External ear normal.     Nose: Nose normal.     Mouth/Throat:     Pharynx: Oropharynx is clear.  Eyes:     General: No scleral icterus. Cardiovascular:     Rate and Rhythm: Normal rate and regular rhythm.     Heart sounds: Normal heart sounds.  Pulmonary:     Effort: Pulmonary effort is normal.     Breath sounds: Normal breath sounds.  Musculoskeletal:     Right lower leg: No edema.     Left lower leg: No edema.  Lymphadenopathy:     Cervical: No cervical adenopathy.  Skin:    General: Skin is warm and dry.  Neurological:     General: No focal deficit present.     Mental Status: She is alert.  Psychiatric:        Mood and Affect: Mood normal.        Behavior: Behavior normal.        Thought Content: Thought content normal.        Judgment: Judgment normal.         Assessment & Plan    1. COPD exacerbation ([place]) Consider steroids if she worsens. - doxycycline (VIBRA-TABS) 100 MG tablet; Take 1 tablet (100 mg total) by mouth 2 (two) times daily for 7 days.  Dispense: 14 tablet; Refill: 0  2. Essential hypertension   3. Bronchiectasis without acute exacerbation ([place])   4. Do not resuscitate status     I have done the exam and reviewed the above chart and it is accurate to the best of my knowledge. Development worker, community has been used in this note in any air is in the dictation or transcription are  unintentional.  [name], MD  [place]

## 2018-12-21 ENCOUNTER — Encounter: Payer: Self-pay | Admitting: Family Medicine

## 2018-12-21 ENCOUNTER — Ambulatory Visit (INDEPENDENT_AMBULATORY_CARE_PROVIDER_SITE_OTHER): Payer: Medicare Other | Admitting: Family Medicine

## 2018-12-21 ENCOUNTER — Telehealth: Payer: Self-pay | Admitting: Family Medicine

## 2018-12-21 VITALS — BP 124/66 | HR 105 | Temp 97.9°F | Resp 16 | Wt 121.4 lb

## 2018-12-21 DIAGNOSIS — N390 Urinary tract infection, site not specified: Secondary | ICD-10-CM | POA: Diagnosis not present

## 2018-12-21 DIAGNOSIS — R319 Hematuria, unspecified: Secondary | ICD-10-CM

## 2018-12-21 DIAGNOSIS — R350 Frequency of micturition: Secondary | ICD-10-CM | POA: Diagnosis not present

## 2018-12-21 LAB — POCT URINALYSIS DIPSTICK
Glucose, UA: NEGATIVE
NITRITE UA: POSITIVE
PROTEIN UA: POSITIVE — AB
Spec Grav, UA: 1.01 (ref 1.010–1.025)
UROBILINOGEN UA: 0.2 U/dL
pH, UA: 6.5 (ref 5.0–8.0)

## 2018-12-21 MED ORDER — CEPHALEXIN 500 MG PO CAPS: 500.0000 mg | ORAL_CAPSULE | Freq: Two times a day (BID) | ORAL | 0 refills | Status: DC

## 2018-12-21 NOTE — Telephone Encounter (Signed)
Appt scheduled today to be evaluated.

## 2018-12-21 NOTE — Patient Instructions (Signed)
We will call you with the urine culture results. 

## 2018-12-21 NOTE — Progress Notes (Signed)
  Subjective:     Patient ID: Joyce [name], female   DOB: 02-Apr-1928, 85 y.o.   MRN: [id number] Chief Complaint  Patient presents with  . Altered Mental Status    Patient comes in office today with daughter who has concerns of patients mental status. Daughter states that for the past 3 days paitent has had symptoms of confusion, decreased appetite and being lethargic.    HPI Currently on doxycyline for a COPD exacerbation. Hx of overactive bladder and urinary colonization per urology. Accompanied by her daughter today.  Review of Systems     Objective:   Physical Exam Constitutional:      General: She is not in acute distress.    Appearance: She is not ill-appearing.  Genitourinary:    Comments: No cva tenderness. Neurological:     Mental Status: She is alert.        Assessment:    1. Urinary tract infection with hematuria, site unspecified - POCT urinalysis dipstick - Urine Culture - cephALEXin (KEFLEX) 500 MG capsule; Take 1 capsule (500 mg total) by mouth 2 (two) times daily.  Dispense: 14 capsule; Refill: 0    Plan:    Further f/u pending urine culture results.

## 2018-12-21 NOTE — Telephone Encounter (Signed)
Pt's daughter is concerned for pt.  Since she has been seen last week she is  Lethargic, sleeping alot and confused.  Please advise.  Thanks, [employer]

## 2018-12-23 ENCOUNTER — Telehealth: Payer: Self-pay

## 2018-12-23 LAB — URINE CULTURE

## 2018-12-23 NOTE — Telephone Encounter (Signed)
Daughter has been advised. KW

## 2018-12-23 NOTE — Telephone Encounter (Signed)
-----   Message from Carmon Ginsberg, [place] sent at 12/23/2018 11:44 AM EST ----- Let patient and daughter know she is on the right antibiotic for an E.Coli infection

## 2018-12-24 ENCOUNTER — Other Ambulatory Visit: Payer: Self-pay

## 2018-12-24 ENCOUNTER — Encounter: Payer: Self-pay | Admitting: *Deleted

## 2018-12-24 ENCOUNTER — Emergency Department: Payer: Medicare Other

## 2018-12-24 ENCOUNTER — Inpatient Hospital Stay
Admission: EM | Admit: 2018-12-24 | Discharge: 2018-12-27 | DRG: 193 | Disposition: A | Discharge status: Home Health | Payer: Medicare Other | Attending: Internal Medicine | Admitting: Internal Medicine

## 2018-12-24 DIAGNOSIS — Z85828 Personal history of other malignant neoplasm of skin: Secondary | ICD-10-CM | POA: Diagnosis not present

## 2018-12-24 DIAGNOSIS — E871 Hypo-osmolality and hyponatremia: Secondary | ICD-10-CM | POA: Diagnosis not present

## 2018-12-24 DIAGNOSIS — J440 Chronic obstructive pulmonary disease with acute lower respiratory infection: Secondary | ICD-10-CM | POA: Diagnosis present

## 2018-12-24 DIAGNOSIS — Z79899 Other long term (current) drug therapy: Secondary | ICD-10-CM | POA: Diagnosis not present

## 2018-12-24 DIAGNOSIS — K219 Gastro-esophageal reflux disease without esophagitis: Secondary | ICD-10-CM | POA: Diagnosis present

## 2018-12-24 DIAGNOSIS — A419 Sepsis, unspecified organism: Secondary | ICD-10-CM | POA: Diagnosis not present

## 2018-12-24 DIAGNOSIS — E785 Hyperlipidemia, unspecified: Secondary | ICD-10-CM | POA: Diagnosis present

## 2018-12-24 DIAGNOSIS — Z79890 Hormone replacement therapy (postmenopausal): Secondary | ICD-10-CM | POA: Diagnosis not present

## 2018-12-24 DIAGNOSIS — E7800 Pure hypercholesterolemia, unspecified: Secondary | ICD-10-CM | POA: Diagnosis present

## 2018-12-24 DIAGNOSIS — F411 Generalized anxiety disorder: Secondary | ICD-10-CM | POA: Diagnosis not present

## 2018-12-24 DIAGNOSIS — J9601 Acute respiratory failure with hypoxia: Secondary | ICD-10-CM | POA: Diagnosis not present

## 2018-12-24 DIAGNOSIS — J189 Pneumonia, unspecified organism: Secondary | ICD-10-CM | POA: Diagnosis present

## 2018-12-24 DIAGNOSIS — R0602 Shortness of breath: Secondary | ICD-10-CM | POA: Diagnosis not present

## 2018-12-24 DIAGNOSIS — I10 Essential (primary) hypertension: Secondary | ICD-10-CM | POA: Diagnosis present

## 2018-12-24 DIAGNOSIS — J181 Lobar pneumonia, unspecified organism: Secondary | ICD-10-CM | POA: Diagnosis not present

## 2018-12-24 DIAGNOSIS — Z96653 Presence of artificial knee joint, bilateral: Secondary | ICD-10-CM | POA: Diagnosis present

## 2018-12-24 DIAGNOSIS — J9602 Acute respiratory failure with hypercapnia: Secondary | ICD-10-CM

## 2018-12-24 DIAGNOSIS — Z85850 Personal history of malignant neoplasm of thyroid: Secondary | ICD-10-CM | POA: Diagnosis not present

## 2018-12-24 DIAGNOSIS — J42 Unspecified chronic bronchitis: Secondary | ICD-10-CM

## 2018-12-24 DIAGNOSIS — J441 Chronic obstructive pulmonary disease with (acute) exacerbation: Secondary | ICD-10-CM | POA: Diagnosis present

## 2018-12-24 DIAGNOSIS — Z853 Personal history of malignant neoplasm of breast: Secondary | ICD-10-CM

## 2018-12-24 DIAGNOSIS — E890 Postprocedural hypothyroidism: Secondary | ICD-10-CM | POA: Diagnosis present

## 2018-12-24 DIAGNOSIS — M7989 Other specified soft tissue disorders: Secondary | ICD-10-CM | POA: Diagnosis not present

## 2018-12-24 LAB — CBC WITH DIFFERENTIAL/PLATELET
Abs Immature Granulocytes: 0.19 10*3/uL — ABNORMAL HIGH (ref 0.00–0.07)
Basophils Absolute: 0 10*3/uL (ref 0.0–0.1)
Basophils Relative: 0 %
Eosinophils Absolute: 0.1 10*3/uL (ref 0.0–0.5)
Eosinophils Relative: 1 %
HCT: 40 % (ref 36.0–46.0)
Hemoglobin: 13.5 g/dL (ref 12.0–15.0)
Immature Granulocytes: 2 %
LYMPHS ABS: 1.1 10*3/uL (ref 0.7–4.0)
Lymphocytes Relative: 12 %
MCH: 30.1 pg (ref 26.0–34.0)
MCHC: 33.8 g/dL (ref 30.0–36.0)
MCV: 89.1 fL (ref 80.0–100.0)
Monocytes Absolute: 1 10*3/uL (ref 0.1–1.0)
Monocytes Relative: 12 %
Neutro Abs: 6.6 10*3/uL (ref 1.7–7.7)
Neutrophils Relative %: 73 %
Platelets: 438 10*3/uL — ABNORMAL HIGH (ref 150–400)
RBC: 4.49 MIL/uL (ref 3.87–5.11)
RDW: 12.1 % (ref 11.5–15.5)
WBC: 8.9 10*3/uL (ref 4.0–10.5)
nRBC: 0 % (ref 0.0–0.2)

## 2018-12-24 LAB — BLOOD GAS, VENOUS
Acid-Base Excess: 9.7 mmol/L — ABNORMAL HIGH (ref 0.0–2.0)
BICARBONATE: 37.6 mmol/L — AB (ref 20.0–28.0)
O2 Saturation: 55.1 %
Patient temperature: 37
pCO2, Ven: 65 mmHg — ABNORMAL HIGH (ref 44.0–60.0)
pH, Ven: 7.37 (ref 7.250–7.430)
pO2, Ven: <31 mmHg — CL (ref 32.0–45.0)

## 2018-12-24 LAB — COMPREHENSIVE METABOLIC PANEL
ALT: 74 U/L — AB (ref 0–44)
AST: 75 U/L — ABNORMAL HIGH (ref 15–41)
Albumin: 3.1 g/dL — ABNORMAL LOW (ref 3.5–5.0)
Alkaline Phosphatase: 79 U/L (ref 38–126)
Anion gap: 11 (ref 5–15)
BUN: 16 mg/dL (ref 8–23)
CO2: 32 mmol/L (ref 22–32)
Calcium: 9.3 mg/dL (ref 8.9–10.3)
Chloride: 83 mmol/L — ABNORMAL LOW (ref 98–111)
Creatinine, Ser: 0.44 mg/dL (ref 0.44–1.00)
GFR calc Af Amer: >60 mL/min (ref >60)
GFR calc non Af Amer: >60 mL/min (ref >60)
Glucose, Bld: 130 mg/dL — ABNORMAL HIGH (ref 70–99)
Potassium: 3.5 mmol/L (ref 3.5–5.1)
Sodium: 126 mmol/L — ABNORMAL LOW (ref 135–145)
Total Bilirubin: 0.5 mg/dL (ref 0.3–1.2)
Total Protein: 6.7 g/dL (ref 6.5–8.1)

## 2018-12-24 LAB — INFLUENZA PANEL BY PCR (TYPE A & B)
Influenza A By PCR: NEGATIVE
Influenza B By PCR: NEGATIVE

## 2018-12-24 LAB — PROTIME-INR
INR: 0.95
Prothrombin Time: 12.6 seconds (ref 11.4–15.2)

## 2018-12-24 LAB — LACTIC ACID, PLASMA
Lactic Acid, Venous: 1.6 mmol/L (ref 0.5–1.9)
Lactic Acid, Venous: 1.7 mmol/L (ref 0.5–1.9)

## 2018-12-24 LAB — BRAIN NATRIURETIC PEPTIDE: B Natriuretic Peptide: 70 pg/mL (ref 0.0–100.0)

## 2018-12-24 MED ORDER — SODIUM CHLORIDE 0.9% FLUSH
3.0000 mL | Freq: Once | INTRAVENOUS | Status: AC
  Administered 2018-12-24: 3 mL via INTRAVENOUS

## 2018-12-24 MED ORDER — SODIUM CHLORIDE 0.9 % IV BOLUS
1000.0000 mL | Freq: Once | INTRAVENOUS | Status: AC
  Administered 2018-12-24: 1000 mL via INTRAVENOUS

## 2018-12-24 MED ORDER — VANCOMYCIN HCL IN DEXTROSE 1-5 GM/200ML-% IV SOLN
1000.0000 mg | Freq: Once | INTRAVENOUS | Status: AC
  Administered 2018-12-24: 1000 mg via INTRAVENOUS
  Filled 2018-12-24: qty 200

## 2018-12-24 MED ORDER — LORAZEPAM 2 MG/ML IJ SOLN
0.5000 mg | Freq: Once | INTRAMUSCULAR | Status: AC
  Administered 2018-12-24: 0.5 mg via INTRAVENOUS
  Filled 2018-12-24: qty 1

## 2018-12-24 MED ORDER — PIPERACILLIN-TAZOBACTAM 3.375 G IVPB 30 MIN
3.3750 g | Freq: Once | INTRAVENOUS | Status: AC
  Administered 2018-12-24: 3.375 g via INTRAVENOUS
  Filled 2018-12-24: qty 50

## 2018-12-24 MED ORDER — IPRATROPIUM-ALBUTEROL 0.5-2.5 (3) MG/3ML IN SOLN
3.0000 mL | Freq: Once | RESPIRATORY_TRACT | Status: AC
  Administered 2018-12-24: 3 mL via RESPIRATORY_TRACT
  Filled 2018-12-24: qty 3

## 2018-12-24 NOTE — ED Triage Notes (Signed)
Per family pt has been having increased weakness and fatigue with confusion. Since last Wednesday pt has had decreased PO intake and difficulty forming her words. Pt is  currently on 2 antibiotics for a UTI that family states tested positive for ecoli. Pt also was exposed to the flu last week and dx with bronchitis. Antibiotics are cephalexin and doxycycline. PT has been having hypotension per family. 99/55 in triage.

## 2018-12-24 NOTE — ED Provider Notes (Addendum)
[facility] Emergency Department Provider Note  ____________________________________________  Time seen: Approximately 7:56 PM  I have reviewed the triage vital signs and the nursing notes.   HISTORY  Chief Complaint Weakness and Altered Mental Status    HPI Joyce [name] is a 85 y.o. female history of COPD not on oxygen, asthma, HTN, HL, presenting with cough, altered mental status.  The patient is able to answer most questions herself but is also accompanied by her family.  They report that 1 week ago, she began to have a cough and was seen by her PMD, who placed her on doxycycline for bronchitis, and Keflex for UTI.  Report, the microbiology came back positive for E. coli in the urine.  Since then, she has been confused in her home, has had difficulty forming her words, and has had decreased p.o. intake with generalized weakness.  She has felt more short of breath.  She has not had any nausea vomiting or diarrhea.  She did have an influenza exposure.  Patient also notes right greater than left new lower extremity edema.  On arrival to the emergency department, the patient was hypotensive, and hypoxic.  Past Medical History:  Diagnosis Date  . Anxiety   . Arthritis   . Asthma   . Asthma   . Breast cancer ([place])   . Cancer ([place])   . COPD (chronic obstructive pulmonary disease) ([place])   . Enlarged aorta ([place])   . GERD (gastroesophageal reflux disease)   . High cholesterol   . Hx of skin cancer, basal cell   . Hyperlipemia   . Hypertension   . PUD (peptic ulcer disease)   . Thyroid cancer [facility])     Patient Active Problem List   Diagnosis Date Noted  . Other nonrheumatic aortic valve disorders 10/14/2017  . Do not resuscitate status 08/27/2017  . Personal history of malignant neoplasm of breast 08/27/2017  . Chronic venous hypertension (idiopathic) without complications of bilateral lower extremity 07/25/2017  . Tachycardia, unspecified 02/25/2017  .  Hyponatremia 11/22/2016  . Osteoporosis 09/24/2016  . 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 Hematuria 07/02/2016  . Mass of hand 06/04/2016  . Other specified soft tissue disorders 06/04/2016  . Ascending aorta dilatation ([facility]) 05/15/2016  . Generalized anxiety disorder 10/24/2015  . Postoperative hypothyroidism 12/15/2013  . Papillary carcinoma of thyroid ([place]) 11/26/2013  . Primary hyperparathyroidism ([place]) 04/01/2013  . Osteoarthritis of knee 03/23/2013  . Nephrolithiasis 02/03/2013  . Malignant neoplasm of female breast ([place]) 11/28/2009  . Asthma 02/24/2009  . Bronchiectasis without acute exacerbation ([place]) 02/24/2009  . Arthropathy of hand 06/16/2008  . Hyperlipidemia 11/16/2007  . Essential hypertension 11/16/2007    Past Surgical History:  Procedure Laterality Date  . APPENDECTOMY    . BREAST SURGERY    . CHOLECYSTECTOMY    . FRACTURE SURGERY    . JOINT REPLACEMENT    . MASTECTOMY    . REPAIR OF PERFORATED ULCER    . REPLACEMENT TOTAL KNEE BILATERAL    . STOMACH SURGERY    . THYROIDECTOMY      Current Outpatient Rx  . Order #: [id number] Class: Historical Med  . Order #: [id number] Class: Historical Med  . Order #: [id number] Class: Print  . Order #: [id number] Class: Print  . Order #: [id number] Class: Historical Med  . Order #: [id number] Class: Normal  . Order #: [id number] Class: Historical Med  . Order #: [id number] Class: Print  . Order #: [id number] Class: Normal  . Order #: [id number] Class:  Print  . Order #: [id number] Class: Normal  . Order #: [id number] Class: Print  . Order #: [id number] Class: Print  . Order #: [id number] Class: Print  . Order #: [id number] Class: Historical Med  . Order #: [id number] Class: Historical Med  . Order #: [id number] Class: Historical Med  . Order #: [id number] Class: Normal  . Order #: [id number] Class: Normal    Allergies No known allergies  Family History  Problem Relation Age of Onset  . Hypertension Father   . Colon  cancer Father   . Stroke Mother     Social History Social History   Tobacco Use  . Smoking status: Never Smoker  . Smokeless tobacco: Never Used  Substance Use Topics  . Alcohol use: Yes    Alcohol/week: 1.0 standard drinks    Types: 1 Cans of beer per week    Comment: occasional beer, once a year  . Drug use: No    Review of Systems Constitutional: No fever/chills.  Positive generalized weakness.  Positive generalized malaise.  Positive decreased p.o. intake. Eyes: No visual changes. ENT: No sore throat. No congestion or rhinorrhea. Cardiovascular: Denies chest pain. Denies palpitations. Respiratory: Positive shortness of breath.  Positive cough. Gastrointestinal: No abdominal pain.  No nausea, no vomiting.  No diarrhea.  No constipation. Genitourinary: Negative for dysuria. Musculoskeletal: Negative for back pain. Skin: Negative for rash. Neurological: Negative for headaches. No focal numbness, tingling or weakness.  Positive for confusion.    ____________________________________________   PHYSICAL EXAM:  VITAL SIGNS: ED Triage Vitals  Enc Vitals Group     BP 12/24/18 1620 (!) 99/50     Pulse Rate 12/24/18 1620 93     Resp --      Temp 12/24/18 1620 97.6 F (36.4 C)     Temp Source 12/24/18 1620 Oral     SpO2 12/24/18 1620 90 %     Weight 12/24/18 1621 121 lb (54.9 kg)     Height 12/24/18 1621 5' (1.524 m)     Head Circumference --      Peak Flow --      Pain Score 12/24/18 1627 0     Pain Loc --      Pain Edu? --      Excl. in [place]? --     Constitutional: Patient is alert and able to answer most questions appropriately.  GCS is 15.  She is chronically ill-appearing and having some respiratory difficulty. Eyes: Conjunctivae are normal.  EOMI. No scleral icterus. Head: Atraumatic. Nose: No congestion/rhinnorhea. Mouth/Throat: Mucous membranes are moist.  Neck: No stridor.  Supple.  Significant kyphosis.  No JVD.  No meningismus. Cardiovascular: Normal  rate, regular rhythm. No murmurs, rubs or gallops.  The patient is hypotensive to 99/50 on my exam.  She has rales diffusely in all lung fields.  He has mild end expiratory wheezing. Respiratory: The patient is tachypneic with accessory muscle use and retractions.  She has diffuse Gastrointestinal: Soft, nontender and nondistended.  No guarding or rebound.  No peritoneal signs. Musculoskeletal: No LE edema.  Neurologic: Clear speech on my exam..  Face and smile are symmetric.  EOMI.  Moves all extremities well. Skin:  Skin is warm, dry and intact. No rash noted. Psychiatric: Mood and affect are normal.   ____________________________________________   LABS (all labs ordered are listed, but only abnormal results are displayed)  Labs Reviewed  COMPREHENSIVE METABOLIC PANEL - Abnormal; Notable for the following components:      Result Value   Sodium 126 (*)  Chloride 83 (*)    Glucose, Bld 130 (*)    Albumin 3.1 (*)    AST 75 (*)    ALT 74 (*)    All other components within normal limits  CBC WITH DIFFERENTIAL/PLATELET - Abnormal; Notable for the following components:   Platelets 438 (*)    Abs Immature Granulocytes 0.19 (*)    All other components within normal limits  BLOOD GAS, VENOUS - Abnormal; Notable for the following components:   pCO2, Ven 65 (*)    pO2, Ven <31.0 (*)    Bicarbonate 37.6 (*)    Acid-Base Excess 9.7 (*)    All other components within normal limits  CULTURE, BLOOD (ROUTINE X 2)  CULTURE, BLOOD (ROUTINE X 2)  URINE CULTURE  LACTIC ACID, PLASMA  LACTIC ACID, PLASMA  PROTIME-INR  BRAIN NATRIURETIC PEPTIDE  URINALYSIS, COMPLETE (UACMP) WITH MICROSCOPIC  URINALYSIS, ROUTINE W REFLEX MICROSCOPIC  INFLUENZA PANEL BY PCR (TYPE A & B)   ____________________________________________  EKG  ED ECG REPORT I, Anne-Caroline Mariea Clonts, the attending physician, personally viewed and interpreted this ECG.   Date: 12/24/2018  EKG Time: 2246  Rate: 137 - although  this is incorrect; artifact  Rhythm: normal sinus rhythm  Axis: normal  Intervals:none  ST&T Change: Poor baseline tracing.   The patient is unable to be still, and this EKG will need to be repeated when she is more able to cooperate  Repeat EKG: ED ECG REPORT I, Anne-Caroline Mariea Clonts, the attending physician, personally viewed and interpreted this ECG.   Date: 12/24/2018  EKG Time: 2306  Rate: 85  Rhythm: normal sinus rhythm  Axis: normal  Intervals:first-degree A-V block   ST&T Change: No STEMI   ____________________________________________  RADIOLOGY  Dg Chest 2 View  Result Date: 12/24/2018 CLINICAL DATA:  85 year old female with a history of sepsis EXAM: CHEST - 2 VIEW COMPARISON:  08/24/2018 FINDINGS: Cardiomediastinal silhouette unchanged with cardiomegaly. Calcifications of the aortic arch. New opacities at the bilateral lung bases with obscuration of the hemidiaphragms and heart borders. Greater on the right. Opacity involves the middle lobe on the lateral view. Pleural effusion present. Osteopenia. Degenerative changes of the spine. Degenerative changes of the shoulders. IMPRESSION: Multifocal pneumonia, involving bilateral lower lobes and the right middle lobe. Likely parapneumonic effusions. Aspiration not excluded given the distribution. Electronically Signed   By: Corrie Mckusick D.O.   On: 12/24/2018 17:02    ____________________________________________   PROCEDURES  Procedure(s) performed: None  Procedures  Critical Care performed: Yes, see critical care note(s) ____________________________________________   INITIAL IMPRESSION / ASSESSMENT AND PLAN / ED COURSE  Pertinent labs & imaging results that were available during my care of the patient were reviewed by me and considered in my medical decision making (see chart for details).  85 y.o. female with a history of COPD and asthma presenting with confusion, cough and shortness of breath.  Overall, I am  concerned about sepsis and the respiratory status of this patient.  I will place her on BiPAP and get a VBG.  She has a chest x-ray which shows multifocal pneumonia, and vancomycin and Zosyn have been ordered.  She will also receive intravenous fluids, but I will give her a liter and reevaluate her as she does have significant lower extremity edema and I am concerned about possible congestive heart failure.  BNP and troponin are pending.  Patient is also hyponatremic; her intravenous fluids will help with that and this is likely due to her decreased p.o. intake.  Urinalysis is pending.  Plan admission.  ----------------------------------------- 9:27 PM on 12/24/2018 -----------------------------------------  The patient's blood gases show hypercarbia and toxemia.  She is on BiPAP.  We will plan to admit the patient at this time.  CRITICAL CARE Performed by: Eula Listen   Total critical care time: 45 minutes  Critical care time was exclusive of separately billable procedures and treating other patients.  Critical care was necessary to treat or prevent imminent or life-threatening deterioration.  Critical care was time spent personally by me on the following activities: development of treatment plan with patient and/or surrogate as well as nursing, discussions with consultants, evaluation of patient's response to treatment, examination of patient, obtaining history from patient or surrogate, ordering and performing treatments and interventions, ordering and review of laboratory studies, ordering and review of radiographic studies, pulse oximetry and re-evaluation of patient's condition.   ____________________________________________  FINAL CLINICAL IMPRESSION(S) / ED DIAGNOSES  Final diagnoses:  Sepsis, due to unspecified organism, unspecified whether acute organ dysfunction present ([place])  Hyponatremia  Multifocal pneumonia  Acute respiratory failure with hypercapnia ([place])          NEW MEDICATIONS STARTED DURING THIS VISIT:  New Prescriptions   No medications on file      Eula Listen, MD 12/24/18 2247    Eula Listen, MD 12/24/18 [phone]

## 2018-12-24 NOTE — H&P (Signed)
[facility] NAME: Joyce [name]    MR#:  [id number]  DATE OF BIRTH:  [birth year]  DATE OF ADMISSION:  12/24/2018  PRIMARY CARE PHYSICIAN: Jerrol Banana., MD   REQUESTING/REFERRING PHYSICIAN: Mariea Clonts, MD  CHIEF COMPLAINT:   Chief Complaint  Patient presents with  . Weakness  . Altered Mental Status    HISTORY OF PRESENT ILLNESS:  Joyce [name]  is a 85 y.o. female who presents with chief complaint as above.  Patient presents to the ED with significant shortness of breath.  She has been treated recently for UTI.  Here in the ED today she is found to have multifocal pneumonia.  She has COPD and also seems to be in exacerbation with wheezing in addition to her shortness of breath.  She required BiPAP in the ED due to hypoxia and hypercapnia.  Hospitalist were called for admission  PAST MEDICAL HISTORY:   Past Medical History:  Diagnosis Date  . Anxiety   . Arthritis   . Asthma   . Asthma   . Breast cancer ([place])   . Cancer ([place])   . COPD (chronic obstructive pulmonary disease) ([place])   . Enlarged aorta ([place])   . GERD (gastroesophageal reflux disease)   . High cholesterol   . Hx of skin cancer, basal cell   . Hyperlipemia   . Hypertension   . PUD (peptic ulcer disease)   . Thyroid cancer ([place])      PAST SURGICAL HISTORY:   Past Surgical History:  Procedure Laterality Date  . APPENDECTOMY    . BREAST SURGERY    . CHOLECYSTECTOMY    . FRACTURE SURGERY    . JOINT REPLACEMENT    . MASTECTOMY    . REPAIR OF PERFORATED ULCER    . REPLACEMENT TOTAL KNEE BILATERAL    . STOMACH SURGERY    . THYROIDECTOMY       SOCIAL HISTORY:   Social History   Tobacco Use  . Smoking status: Never Smoker  . Smokeless tobacco: Never Used  Substance Use Topics  . Alcohol use: Yes    Alcohol/week: 1.0 standard drinks    Types: 1 Cans of beer per week    Comment: occasional beer, once a year     FAMILY HISTORY:    Family History  Problem Relation Age of Onset  . Hypertension Father   . Colon cancer Father   . Stroke Mother      DRUG ALLERGIES:   Allergies  Allergen Reactions  . No Known Allergies     verified    MEDICATIONS AT HOME:   Prior to Admission medications   Medication Sig Start Date End Date Taking? Authorizing Provider  acetaminophen (TYLENOL) 325 MG tablet Take 650 mg by mouth every 6 (six) hours as needed.   Yes [provider]  acidophilus (RISAQUAD) CAPS capsule Take 1 capsule by mouth daily.   Yes [provider]  alendronate (FOSAMAX) 70 MG tablet Take 1 tablet (70 mg total) by mouth once a week. Take with a full glass of water on an empty stomach. 08/06/18  Yes Jerrol Banana., MD  atorvastatin (LIPITOR) 10 MG tablet Take 1 tablet (10 mg total) by mouth daily. 08/06/18  Yes Jerrol Banana., MD  calcium carbonate (TUMS - DOSED IN MG ELEMENTAL CALCIUM) 500 MG chewable tablet Chew 2 tablets by mouth daily.    Yes [provider]  cephALEXin (KEFLEX)  500 MG capsule Take 1 capsule (500 mg total) by mouth 2 (two) times daily. 12/21/18  Yes Carmon Ginsberg, PA  cholecalciferol (VITAMIN D) 1000 units tablet Take 2,000 Units by mouth daily.   Yes [provider]  diltiazem (DILACOR XR) 120 MG 24 hr capsule Take 2 capsules by mouth every morning, and 1 capsule every evening. 09/16/18  Yes Jerrol Banana., MD  doxycycline (VIBRA-TABS) 100 MG tablet Take 1 tablet (100 mg total) by mouth 2 (two) times daily for 7 days. 12/17/18 12/24/18 Yes Jerrol Banana., MD  enalapril (VASOTEC) 20 MG tablet Take 1 tablet (20 mg total) by mouth daily. 08/06/18  Yes Jerrol Banana., MD  fesoterodine (TOVIAZ) 8 MG TB24 tablet Take 1 tablet (8 mg total) by mouth daily. 09/01/18  Yes McGowan, Larene Beach A, PA-C  levothyroxine (SYNTHROID, LEVOTHROID) 150 MCG tablet Take 1 tablet (150 mcg total) by mouth daily before breakfast. 08/06/18  Yes  Jerrol Banana., MD  LORazepam (ATIVAN) 0.5 MG tablet Take 0.5 tablets (0.25 mg total) by mouth 3 (three) times daily as needed for anxiety. 08/06/18  Yes Jerrol Banana., MD  mirtazapine (REMERON) 7.5 MG tablet Take 1 tablet (7.5 mg total) by mouth at bedtime. 08/06/18  Yes Jerrol Banana., MD  Multiple Vitamin (MULTIVITAMIN WITH MINERALS) TABS tablet Take 1 tablet by mouth daily.   Yes [provider]  Grant Ruts INHUB 500-50 MCG/DOSE AEPB INL 1 PUFF INTO THE LUNGS BID 11/10/18  Yes [provider]  albuterol (PROVENTIL HFA;VENTOLIN HFA) 108 (90 Base) MCG/ACT inhaler Inhale into the lungs. 09/24/16   [provider]  feeding supplement, ENSURE ENLIVE, (ENSURE ENLIVE) LIQD Take 237 mLs by mouth 2 (two) times daily between meals. 11/24/16   Fritzi Mandes, MD  oseltamivir (TAMIFLU) 75 MG capsule Take 1 capsule (75 mg total) by mouth daily. Patient not taking: Reported on 12/21/2018 12/10/18   Jerrol Banana., MD    REVIEW OF SYSTEMS:  ROS   VITAL SIGNS:   Vitals:   12/24/18 1620 12/24/18 1621 12/24/18 1627 12/24/18 2000  BP: (!) 99/50   119/64  Pulse: 93   75  Resp:    (!) 22  Temp: 97.6 F (36.4 C)     TempSrc: Oral  Oral   SpO2: 90%   93%  Weight:  54.9 kg 54.9 kg   Height:  5' (1.524 m) 5' (1.524 m)    Wt Readings from Last 3 Encounters:  12/24/18 54.9 kg  12/21/18 55.1 kg  12/17/18 55.2 kg    PHYSICAL EXAMINATION:  Physical Exam  LABORATORY PANEL:   CBC Recent Labs  Lab 12/24/18 1645  WBC 8.9  HGB 13.5  HCT 40.0  PLT 438*   ------------------------------------------------------------------------------------------------------------------  Chemistries  Recent Labs  Lab 12/24/18 1645  NA 126*  K 3.5  CL 83*  CO2 32  GLUCOSE 130*  BUN 16  CREATININE 0.44  CALCIUM 9.3  AST 75*  ALT 74*  ALKPHOS 79  BILITOT 0.5    ------------------------------------------------------------------------------------------------------------------  Cardiac Enzymes No results for input(s): TROPONINI in the last 168 hours. ------------------------------------------------------------------------------------------------------------------  RADIOLOGY:  Dg Chest 2 View  Result Date: 12/24/2018 CLINICAL DATA:  85 year old female with a history of sepsis EXAM: CHEST - 2 VIEW COMPARISON:  08/24/2018 FINDINGS: Cardiomediastinal silhouette unchanged with cardiomegaly. Calcifications of the aortic arch. New opacities at the bilateral lung bases with obscuration of the hemidiaphragms and heart borders. Greater on the right. Opacity  involves the middle lobe on the lateral view. Pleural effusion present. Osteopenia. Degenerative changes of the spine. Degenerative changes of the shoulders. IMPRESSION: Multifocal pneumonia, involving bilateral lower lobes and the right middle lobe. Likely parapneumonic effusions. Aspiration not excluded given the distribution. Electronically Signed   By: Corrie Mckusick D.O.   On: 12/24/2018 17:02    EKG:   Orders placed or performed during the hospital encounter of 12/24/18  . ED EKG  . ED EKG    IMPRESSION AND PLAN:  Principal Problem:   Multifocal pneumonia -IV antibiotics initiated, PRN supportive treatment with antitussive, duo nebs.  Patient is on BiPAP due to hypoxia and hypercapnia in the ED, see below Active Problems:   COPD with acute exacerbation (HCC) -IV Solu-Medrol, antibiotics as above, COPD exacerbation is due to underlying pneumonia, PRN supportive treatment as above, continue BiPAP for now   Acute respiratory failure with hypoxia and hypercapnia ([place]) -patient is currently on BiPAP with improvement in her oxygen saturation levels.  We will recheck a blood gas to see if she is able to wean from the BiPAP   Generalized anxiety disorder -home dose anxiolytic   Hyperlipidemia -home dose  antilipid   Postoperative hypothyroidism -home dose thyroid replacement  Chart review performed and case discussed with ED provider. Labs, imaging and/or ECG reviewed by provider and discussed with patient/family. Management plans discussed with the patient and/or family.  DVT PROPHYLAXIS: SubQ lovenox   GI PROPHYLAXIS:  None  ADMISSION STATUS: Inpatient     CODE STATUS: Full Code Status History    Date Active Date Inactive Code Status Order ID Comments User Context   11/22/2016 1858 11/24/2016 1735 Full Code [id number]  Dustin Flock, MD Inpatient      TOTAL CRITICAL CARE TIME TAKING CARE OF THIS PATIENT: 50 minutes.   Ethlyn Daniels 12/24/2018, 9:48 PM  [facility]  Office  [phone]  CC: Primary care physician; Jerrol Banana., MD  Note:  This document was prepared using Dragon voice recognition software and may include unintentional dictation errors.

## 2018-12-24 NOTE — ED Notes (Signed)
In and out cath for urine, unable to obtain urine. NS started and Purewick placed. Family went home at this time.

## 2018-12-25 LAB — CBC
HCT: 39.3 % (ref 36.0–46.0)
Hemoglobin: 13 g/dL (ref 12.0–15.0)
MCH: 30 pg (ref 26.0–34.0)
MCHC: 33.1 g/dL (ref 30.0–36.0)
MCV: 90.8 fL (ref 80.0–100.0)
Platelets: 360 10*3/uL (ref 150–400)
RBC: 4.33 MIL/uL (ref 3.87–5.11)
RDW: 12.1 % (ref 11.5–15.5)
WBC: 7.5 10*3/uL (ref 4.0–10.5)
nRBC: 0 % (ref 0.0–0.2)

## 2018-12-25 LAB — URINALYSIS, COMPLETE (UACMP) WITH MICROSCOPIC
BACTERIA UA: NONE SEEN
Bilirubin Urine: NEGATIVE
Glucose, UA: NEGATIVE mg/dL
Hgb urine dipstick: NEGATIVE
Ketones, ur: NEGATIVE mg/dL
Leukocytes,Ua: NEGATIVE
Nitrite: NEGATIVE
Protein, ur: NEGATIVE mg/dL
Specific Gravity, Urine: 1.014 (ref 1.005–1.030)
pH: 6 (ref 5.0–8.0)

## 2018-12-25 LAB — BLOOD GAS, ARTERIAL
Acid-Base Excess: 7.4 mmol/L — ABNORMAL HIGH (ref 0.0–2.0)
Bicarbonate: 34.7 mmol/L — ABNORMAL HIGH (ref 20.0–28.0)
Delivery systems: POSITIVE
Expiratory PAP: 4
FIO2: 0.28
Inspiratory PAP: 8
LHR: 8 {breaths}/min
O2 Saturation: 95.7 %
Patient temperature: 37
pCO2 arterial: 60 mmHg — ABNORMAL HIGH (ref 32.0–48.0)
pH, Arterial: 7.37 (ref 7.350–7.450)
pO2, Arterial: 82 mmHg — ABNORMAL LOW (ref 83.0–108.0)

## 2018-12-25 LAB — MRSA PCR SCREENING: MRSA by PCR: NEGATIVE

## 2018-12-25 LAB — BASIC METABOLIC PANEL
Anion gap: 9 (ref 5–15)
BUN: 11 mg/dL (ref 8–23)
CO2: 32 mmol/L (ref 22–32)
CREATININE: 0.37 mg/dL — AB (ref 0.44–1.00)
Calcium: 8.2 mg/dL — ABNORMAL LOW (ref 8.9–10.3)
Chloride: 89 mmol/L — ABNORMAL LOW (ref 98–111)
GFR calc Af Amer: >60 mL/min (ref >60)
GFR calc non Af Amer: >60 mL/min (ref >60)
GLUCOSE: 107 mg/dL — AB (ref 70–99)
Potassium: 3.7 mmol/L (ref 3.5–5.1)
Sodium: 130 mmol/L — ABNORMAL LOW (ref 135–145)

## 2018-12-25 MED ORDER — ACETAMINOPHEN 325 MG PO TABS: 650.0000 mg | ORAL_TABLET | Freq: Four times a day (QID) | ORAL | Status: DC | PRN

## 2018-12-25 MED ORDER — METHYLPREDNISOLONE SODIUM SUCC 125 MG IJ SOLR
60.0000 mg | Freq: Two times a day (BID) | INTRAMUSCULAR | Status: DC
  Administered 2018-12-25 – 2018-12-26 (×2): 60 mg via INTRAVENOUS
  Filled 2018-12-25 (×2): qty 2

## 2018-12-25 MED ORDER — GUAIFENESIN-DM 100-10 MG/5ML PO SYRP
5.0000 mL | ORAL_SOLUTION | ORAL | Status: DC | PRN
  Administered 2018-12-25: 21:00:00 5 mL via ORAL
  Filled 2018-12-25 (×2): qty 5

## 2018-12-25 MED ORDER — DILTIAZEM HCL ER COATED BEADS 120 MG PO CP24
120.0000 mg | ORAL_CAPSULE | Freq: Every day | ORAL | Status: DC
  Administered 2018-12-25 – 2018-12-26 (×2): 120 mg via ORAL
  Filled 2018-12-25 (×4): qty 1

## 2018-12-25 MED ORDER — IPRATROPIUM-ALBUTEROL 0.5-2.5 (3) MG/3ML IN SOLN
3.0000 mL | RESPIRATORY_TRACT | Status: DC | PRN
  Administered 2018-12-25: 3 mL via RESPIRATORY_TRACT
  Filled 2018-12-25: qty 3

## 2018-12-25 MED ORDER — MIRTAZAPINE 15 MG PO TABS
7.5000 mg | ORAL_TABLET | Freq: Every day | ORAL | Status: DC
  Administered 2018-12-25 – 2018-12-26 (×2): 7.5 mg via ORAL
  Filled 2018-12-25 (×2): qty 1

## 2018-12-25 MED ORDER — ENOXAPARIN SODIUM 40 MG/0.4ML ~~LOC~~ SOLN: 40.0000 mg | SUBCUTANEOUS | Status: DC

## 2018-12-25 MED ORDER — VANCOMYCIN HCL IN DEXTROSE 1-5 GM/200ML-% IV SOLN
1000.0000 mg | INTRAVENOUS | Status: DC
  Administered 2018-12-25: 1000 mg via INTRAVENOUS
  Filled 2018-12-25: qty 200

## 2018-12-25 MED ORDER — LORAZEPAM 0.5 MG PO TABS: 0.2500 mg | ORAL_TABLET | Freq: Three times a day (TID) | ORAL | Status: DC | PRN

## 2018-12-25 MED ORDER — METHYLPREDNISOLONE SODIUM SUCC 125 MG IJ SOLR
60.0000 mg | Freq: Four times a day (QID) | INTRAMUSCULAR | Status: DC
  Administered 2018-12-25 (×2): 60 mg via INTRAVENOUS
  Filled 2018-12-25 (×2): qty 2

## 2018-12-25 MED ORDER — FESOTERODINE FUMARATE ER 8 MG PO TB24
8.0000 mg | ORAL_TABLET | Freq: Every day | ORAL | Status: DC
  Administered 2018-12-25 – 2018-12-27 (×3): 8 mg via ORAL
  Filled 2018-12-25 (×3): qty 1

## 2018-12-25 MED ORDER — DILTIAZEM HCL ER 120 MG PO CP24: 120.0000 mg | ORAL_CAPSULE | Freq: Every day | ORAL | Status: DC

## 2018-12-25 MED ORDER — SODIUM CHLORIDE 0.9 % IV SOLN
INTRAVENOUS | Status: AC
  Administered 2018-12-25: 17:00:00 via INTRAVENOUS

## 2018-12-25 MED ORDER — ONDANSETRON HCL 4 MG PO TABS: 4.0000 mg | ORAL_TABLET | Freq: Four times a day (QID) | ORAL | Status: DC | PRN

## 2018-12-25 MED ORDER — LEVOTHYROXINE SODIUM 50 MCG PO TABS
150.0000 ug | ORAL_TABLET | Freq: Every day | ORAL | Status: DC
  Administered 2018-12-25 – 2018-12-27 (×3): 150 ug via ORAL
  Filled 2018-12-25: qty 1
  Filled 2018-12-25: qty 3
  Filled 2018-12-25: qty 1

## 2018-12-25 MED ORDER — ENALAPRIL MALEATE 20 MG PO TABS
20.0000 mg | ORAL_TABLET | Freq: Every day | ORAL | Status: DC
  Administered 2018-12-27: 20 mg via ORAL
  Filled 2018-12-25 (×2): qty 1

## 2018-12-25 MED ORDER — PIPERACILLIN-TAZOBACTAM 3.375 G IVPB
3.3750 g | Freq: Three times a day (TID) | INTRAVENOUS | Status: DC
  Administered 2018-12-25 – 2018-12-27 (×7): 3.375 g via INTRAVENOUS
  Filled 2018-12-25 (×7): qty 50

## 2018-12-25 MED ORDER — ATORVASTATIN CALCIUM 20 MG PO TABS
10.0000 mg | ORAL_TABLET | Freq: Every day | ORAL | Status: DC
  Administered 2018-12-25 – 2018-12-26 (×2): 10 mg via ORAL
  Filled 2018-12-25 (×2): qty 1

## 2018-12-25 MED ORDER — ACETAMINOPHEN 650 MG RE SUPP: 650.0000 mg | Freq: Four times a day (QID) | RECTAL | Status: DC | PRN

## 2018-12-25 MED ORDER — MOMETASONE FURO-FORMOTEROL FUM 200-5 MCG/ACT IN AERO
2.0000 | INHALATION_SPRAY | Freq: Two times a day (BID) | RESPIRATORY_TRACT | Status: DC
  Administered 2018-12-25 – 2018-12-27 (×4): 2 via RESPIRATORY_TRACT
  Filled 2018-12-25: qty 8.8

## 2018-12-25 MED ORDER — DILTIAZEM HCL ER COATED BEADS 240 MG PO CP24
240.0000 mg | ORAL_CAPSULE | Freq: Every day | ORAL | Status: DC
  Administered 2018-12-25 – 2018-12-27 (×2): 240 mg via ORAL
  Filled 2018-12-25 (×3): qty 1

## 2018-12-25 MED ORDER — BENZONATATE 100 MG PO CAPS: 200.0000 mg | ORAL_CAPSULE | Freq: Three times a day (TID) | ORAL | Status: DC | PRN

## 2018-12-25 MED ORDER — ONDANSETRON HCL 4 MG/2ML IJ SOLN: 4.0000 mg | Freq: Four times a day (QID) | INTRAMUSCULAR | Status: DC | PRN

## 2018-12-25 NOTE — ED Notes (Signed)
ED TO INPATIENT HANDOFF REPORT  Name/Age/Gender Joyce [name] 85 y.o. female  Code Status    Code Status Orders  (From admission, onward)         Start     Ordered   12/25/18 0238  Full code  Continuous     12/25/18 0238        Code Status History    Date Active Date Inactive Code Status Order ID Comments User Context   11/22/2016 1858 11/24/2016 1735 Full Code [id number]  Dustin Flock, MD Inpatient      Home/SNF/Other Home  Chief Complaint confused  Level of Care/Admitting Diagnosis ED Disposition    ED Disposition Condition [place] [facility]: [place] [[id number]]  Level of Care: Med-Surg [16]  Diagnosis: Multifocal pneumonia [4098119]  Admitting Physician: Hyman Bible DODD [[id number]]  Attending Physician: Hyman Bible DODD [[id number]]  Estimated length of stay: past midnight tomorrow  Certification:: I certify this patient will need inpatient services for at least 2 midnights  PT Class (Do Not Modify): Inpatient [101]  PT Acc Code (Do Not Modify): Private [1]       Medical History Past Medical History:  Diagnosis Date  . Anxiety   . Arthritis   . Asthma   . Asthma   . Breast cancer ([place])   . Cancer ([place])   . COPD (chronic obstructive pulmonary disease) ([place])   . Enlarged aorta ([place])   . GERD (gastroesophageal reflux disease)   . High cholesterol   . Hx of skin cancer, basal cell   . Hyperlipemia   . Hypertension   . PUD (peptic ulcer disease)   . Thyroid cancer (HCC)     Allergies Allergies  Allergen Reactions  . No Known Allergies     verified    IV Location/Drains/Wounds Patient Lines/Drains/Airways Status   Active Line/Drains/Airways    Name:   Placement date:   Placement time:   Site:   Days:   Peripheral IV 12/24/18 Right Forearm   12/24/18    2049    Forearm   1   External Urinary Catheter   12/24/18    2302    -   1          Labs/Imaging Results for orders placed or performed during the  hospital encounter of 12/24/18 (from the past 48 hour(s))  Comprehensive metabolic panel     Status: Abnormal   Collection Time: 12/24/18  4:45 PM  Result Value Ref Range   Sodium 126 (L) 135 - 145 mmol/L   Potassium 3.5 3.5 - 5.1 mmol/L   Chloride 83 (L) 98 - 111 mmol/L   CO2 32 22 - 32 mmol/L   Glucose, Bld 130 (H) 70 - 99 mg/dL   BUN 16 8 - 23 mg/dL   Creatinine, Ser 0.44 0.44 - 1.00 mg/dL   Calcium 9.3 8.9 - 10.3 mg/dL   Total Protein 6.7 6.5 - 8.1 g/dL   Albumin 3.1 (L) 3.5 - 5.0 g/dL   AST 75 (H) 15 - 41 U/L   ALT 74 (H) 0 - 44 U/L   Alkaline Phosphatase 79 38 - 126 U/L   Total Bilirubin 0.5 0.3 - 1.2 mg/dL   GFR calc non Af Amer >60 >60 mL/min   GFR calc Af Amer >60 >60 mL/min   Anion gap 11 5 - 15    Comment: Performed at [facility], [address] [place], [place] [address]  Lactic acid,  plasma     Status: None   Collection Time: 12/24/18  4:45 PM  Result Value Ref Range   Lactic Acid, Venous 1.6 0.5 - 1.9 mmol/L    Comment: Performed at [facility], [address] [place], [place] [address]  CBC with Differential     Status: Abnormal   Collection Time: 12/24/18  4:45 PM  Result Value Ref Range   WBC 8.9 4.0 - 10.5 K/uL   RBC 4.49 3.87 - 5.11 MIL/uL   Hemoglobin 13.5 12.0 - 15.0 g/dL   HCT 40.0 36.0 - 46.0 %   MCV 89.1 80.0 - 100.0 fL   MCH 30.1 26.0 - 34.0 pg   MCHC 33.8 30.0 - 36.0 g/dL   RDW 12.1 11.5 - 15.5 %   Platelets 438 (H) 150 - 400 K/uL   nRBC 0.0 0.0 - 0.2 %   Neutrophils Relative % 73 %   Neutro Abs 6.6 1.7 - 7.7 K/uL   Lymphocytes Relative 12 %   Lymphs Abs 1.1 0.7 - 4.0 K/uL   Monocytes Relative 12 %   Monocytes Absolute 1.0 0.1 - 1.0 K/uL   Eosinophils Relative 1 %   Eosinophils Absolute 0.1 0.0 - 0.5 K/uL   Basophils Relative 0 %   Basophils Absolute 0.0 0.0 - 0.1 K/uL   Immature Granulocytes 2 %   Abs Immature Granulocytes 0.19 (H) 0.00 - 0.07 K/uL    Comment: Performed at [facility], [address] [place], [place] [address]  Protime-INR     Status: None   Collection Time: 12/24/18  4:45 PM  Result Value Ref Range   Prothrombin Time 12.6 11.4 - 15.2 seconds   INR 0.95     Comment: Performed at [facility], [address], [place], [place] [address]  Culture, blood (Routine x 2)     Status: None (Preliminary result)   Collection Time: 12/24/18  4:45 PM  Result Value Ref Range   Specimen Description BLOOD RIGHT ANTECUBITAL    Special Requests      BOTTLES DRAWN AEROBIC AND ANAEROBIC Blood Culture adequate volume   Culture      NO GROWTH < 24 HOURS Performed at [facility], [address] [place], [place] [address]    Report Status PENDING   Brain natriuretic peptide     Status: None   Collection Time: 12/24/18  4:45 PM  Result Value Ref Range   B Natriuretic Peptide 70.0 0.0 - 100.0 pg/mL    Comment: Performed at [facility], [address] [place], [place] [address]  Lactic acid, plasma     Status: None   Collection Time: 12/24/18  8:40 PM  Result Value Ref Range   Lactic Acid, Venous 1.7 0.5 - 1.9 mmol/L    Comment: Performed at [facility], [address] [place], [place] [address]  Blood gas, venous ([facility], [facility], [facility])     Status: Abnormal   Collection Time: 12/24/18  8:40 PM  Result Value Ref Range   pH, Ven 7.37 7.250 - 7.430   pCO2, Ven 65 (H) 44.0 - 60.0 mmHg   pO2, Ven <31.0 (LL) 32.0 - 45.0 mmHg   Bicarbonate 37.6 (H) 20.0 - 28.0 mmol/L   Acid-Base Excess 9.7 (H) 0.0 - 2.0 mmol/L   O2 Saturation 55.1 %   Patient temperature 37.0    Collection site VEIN    Sample type VENOUS     Comment: Performed at [facility],  [place], [place] [address]  Urinalysis, Complete w Microscopic     Status: Abnormal   Collection Time: 12/24/18  8:41 PM  Result Value Ref Range   Color, Urine YELLOW (A) YELLOW   APPearance CLEAR (A) CLEAR   Specific Gravity, Urine 1.014 1.005 - 1.030   pH 6.0 5.0 - 8.0    Glucose, UA NEGATIVE NEGATIVE mg/dL   Hgb urine dipstick NEGATIVE NEGATIVE   Bilirubin Urine NEGATIVE NEGATIVE   Ketones, ur NEGATIVE NEGATIVE mg/dL   Protein, ur NEGATIVE NEGATIVE mg/dL   Nitrite NEGATIVE NEGATIVE   Leukocytes,Ua NEGATIVE NEGATIVE   RBC / HPF 0-5 0 - 5 RBC/hpf   WBC, UA 6-10 0 - 5 WBC/hpf   Bacteria, UA NONE SEEN NONE SEEN   Squamous Epithelial / LPF 0-5 0 - 5    Comment: Performed at [facility], [address], [place], [place] [address]  Influenza panel by PCR (type A & B)     Status: None   Collection Time: 12/24/18  8:41 PM  Result Value Ref Range   Influenza A By PCR NEGATIVE NEGATIVE   Influenza B By PCR NEGATIVE NEGATIVE    Comment: (NOTE) The Xpert Xpress Flu assay is intended as an aid in the diagnosis of  influenza and should not be used as a sole basis for treatment.  This  assay is FDA approved for nasopharyngeal swab specimens only. Nasal  washings and aspirates are unacceptable for Xpert Xpress Flu testing. Performed at [facility], [address] [place], [place] [address]   Culture, blood (Routine x 2)     Status: None (Preliminary result)   Collection Time: 12/24/18  8:57 PM  Result Value Ref Range   Specimen Description BLOOD BLOOD RIGHT FOREARM    Special Requests      BOTTLES DRAWN AEROBIC AND ANAEROBIC Blood Culture results may not be optimal due to an inadequate volume of blood received in culture bottles   Culture      NO GROWTH < 12 HOURS Performed at [facility], [address], [place], [place] [address]    Report Status PENDING   Blood gas, arterial     Status: Abnormal   Collection Time: 12/25/18 12:28 AM  Result Value Ref Range   FIO2 0.28    Delivery systems BILEVEL POSITIVE AIRWAY PRESSURE    LHR 8 resp/min   Inspiratory PAP 8    Expiratory PAP 4    pH, Arterial 7.37 7.350 - 7.450   pCO2 arterial 60 (H) 32.0 - 48.0 mmHg   pO2, Arterial 82 (L) 83.0 - 108.0 mmHg   Bicarbonate 34.7 (H) 20.0  - 28.0 mmol/L   Acid-Base Excess 7.4 (H) 0.0 - 2.0 mmol/L   O2 Saturation 95.7 %   Patient temperature 37.0    Collection site RIGHT RADIAL    Sample type ARTERIAL DRAW    Allens test (pass/fail) PASS PASS    Comment: Performed at [facility], [address] [place],  [address]  Basic metabolic panel     Status: Abnormal   Collection Time: 12/25/18  7:54 AM  Result Value Ref Range   Sodium 130 (L) 135 - 145 mmol/L   Potassium 3.7 3.5 - 5.1 mmol/L   Chloride 89 (L) 98 - 111 mmol/L   CO2 32 22 - 32 mmol/L   Glucose, Bld 107 (H) 70 - 99 mg/dL   BUN 11 8 - 23 mg/dL   Creatinine, Ser  0.37 (L) 0.44 - 1.00 mg/dL   Calcium 8.2 (L) 8.9 - 10.3 mg/dL   GFR calc non Af Amer >60 >60 mL/min   GFR calc Af Amer >60 >60 mL/min   Anion gap 9 5 - 15    Comment: Performed at [facility], [address] [place], [place] [address]  CBC     Status: None   Collection Time: 12/25/18  7:54 AM  Result Value Ref Range   WBC 7.5 4.0 - 10.5 K/uL   RBC 4.33 3.87 - 5.11 MIL/uL   Hemoglobin 13.0 12.0 - 15.0 g/dL   HCT 39.3 36.0 - 46.0 %   MCV 90.8 80.0 - 100.0 fL   MCH 30.0 26.0 - 34.0 pg   MCHC 33.1 30.0 - 36.0 g/dL   RDW 12.1 11.5 - 15.5 %   Platelets 360 150 - 400 K/uL   nRBC 0.0 0.0 - 0.2 %    Comment: Performed at [facility], [address] [place], [place] [address]  MRSA PCR Screening     Status: None   Collection Time: 12/25/18  8:33 AM  Result Value Ref Range   MRSA by PCR NEGATIVE NEGATIVE    Comment:        The GeneXpert MRSA Assay (FDA approved for NASAL specimens only), is one component of a comprehensive MRSA colonization surveillance program. It is not intended to diagnose MRSA infection nor to guide or monitor treatment for MRSA infections. Performed at [facility], [address] [place], [place] [address]    Dg Chest 2 View  Result Date: 12/24/2018 CLINICAL DATA:  84 year old female with a history of sepsis EXAM: CHEST -  2 VIEW COMPARISON:  08/24/2018 FINDINGS: Cardiomediastinal silhouette unchanged with cardiomegaly. Calcifications of the aortic arch. New opacities at the bilateral lung bases with obscuration of the hemidiaphragms and heart borders. Greater on the right. Opacity involves the middle lobe on the lateral view. Pleural effusion present. Osteopenia. Degenerative changes of the spine. Degenerative changes of the shoulders. IMPRESSION: Multifocal pneumonia, involving bilateral lower lobes and the right middle lobe. Likely parapneumonic effusions. Aspiration not excluded given the distribution. Electronically Signed   By: Corrie Mckusick D.O.   On: 12/24/2018 17:02   US Venous Img Lower Unilateral Right  Result Date: 12/24/2018 CLINICAL DATA:  85 y/o  F; right lower extremity swelling. EXAM: RIGHT LOWER EXTREMITY VENOUS DOPPLER ULTRASOUND TECHNIQUE: Gray-scale sonography with graded compression, as well as color Doppler and duplex ultrasound were performed to evaluate the lower extremity deep venous systems from the level of the common femoral vein and including the common femoral, femoral, profunda femoral, popliteal and calf veins including the posterior tibial, peroneal and gastrocnemius veins when visible. The superficial great saphenous vein was also interrogated. Spectral Doppler was utilized to evaluate flow at rest and with distal augmentation maneuvers in the common femoral, femoral and popliteal veins. COMPARISON:  None. FINDINGS: Contralateral Common Femoral Vein: Respiratory phasicity is normal and symmetric with the symptomatic side. No evidence of thrombus. Normal compressibility. Common Femoral Vein: No evidence of thrombus. Normal compressibility, respiratory phasicity and response to augmentation. Saphenofemoral Junction: No evidence of thrombus. Normal compressibility and flow on color Doppler imaging. Profunda Femoral Vein: No evidence of thrombus. Normal compressibility and flow on color Doppler  imaging. Femoral Vein: No evidence of thrombus. Normal compressibility, respiratory phasicity and response to augmentation. Popliteal Vein: No evidence of thrombus. Normal compressibility, respiratory phasicity and response to augmentation. Calf Veins: No evidence of thrombus.  Normal compressibility and flow on color Doppler imaging. Superficial Great Saphenous Vein: No evidence of thrombus. Normal compressibility. Venous Reflux:  None. Other Findings:  None. IMPRESSION: No evidence of deep venous thrombosis. Electronically Signed   By: Kristine Garbe M.D.   On: 12/24/2018 22:48    Pending Labs Unresulted Labs (From admission, onward)    Start     Ordered   12/31/18 0500  Creatinine, serum  (enoxaparin (LOVENOX)    CrCl >/= 30 ml/min)  Weekly,   STAT    Comments:  while on enoxaparin therapy    12/25/18 0238   12/26/18 0500  Creatinine, serum  Tomorrow morning,   STAT     12/25/18 0832   12/24/18 1942  Urine culture  ONCE - STAT,   STAT     12/24/18 1941          Vitals/Pain Today's Vitals   12/25/18 1000 12/25/18 1030 12/25/18 1130 12/25/18 1200  BP: 111/60 112/60 110/62 (!) 105/57  Pulse:  81    Resp: 19 18 18  (!) 23  Temp:      TempSrc:      SpO2:  97%    Weight:      Height:      PainSc:        Isolation Precautions Droplet precaution  Medications Medications  levothyroxine (SYNTHROID, LEVOTHROID) tablet 150 mcg (150 mcg Oral Given 12/25/18 0827)  LORazepam (ATIVAN) tablet 0.25 mg (has no administration in time range)  enoxaparin (LOVENOX) injection 40 mg (has no administration in time range)  acetaminophen (TYLENOL) tablet 650 mg (has no administration in time range)    Or  acetaminophen (TYLENOL) suppository 650 mg (has no administration in time range)  ondansetron (ZOFRAN) tablet 4 mg (has no administration in time range)    Or  ondansetron (ZOFRAN) injection 4 mg (has no administration in time range)  guaiFENesin-dextromethorphan (ROBITUSSIN DM) 100-10  MG/5ML syrup 5 mL (has no administration in time range)  benzonatate (TESSALON) capsule 200 mg (has no administration in time range)  ipratropium-albuterol (DUONEB) 0.5-2.5 (3) MG/3ML nebulizer solution 3 mL (3 mLs Nebulization Given 12/25/18 0255)  methylPREDNISolone sodium succinate (SOLU-MEDROL) 125 mg/2 mL injection 60 mg (60 mg Intravenous Given 12/25/18 0825)  piperacillin-tazobactam (ZOSYN) IVPB 3.375 g (3.375 g Intravenous Transfusing/Transfer 12/25/18 1226)  vancomycin (VANCOCIN) IVPB 1000 mg/200 mL premix ( Intravenous Stopped 12/25/18 1030)  sodium chloride flush (NS) 0.9 % injection 3 mL (3 mLs Intravenous Given 12/24/18 2341)  sodium chloride 0.9 % bolus 1,000 mL (0 mLs Intravenous Stopped 12/24/18 2350)  vancomycin (VANCOCIN) IVPB 1000 mg/200 mL premix (0 mg Intravenous Stopped 12/24/18 2349)  piperacillin-tazobactam (ZOSYN) IVPB 3.375 g (0 g Intravenous Stopped 12/24/18 2232)  ipratropium-albuterol (DUONEB) 0.5-2.5 (3) MG/3ML nebulizer solution 3 mL (3 mLs Nebulization Given 12/24/18 2237)  LORazepam (ATIVAN) injection 0.5 mg (0.5 mg Intravenous Given 12/24/18 2137)  LORazepam (ATIVAN) injection 0.5 mg (0.5 mg Intravenous Given 12/24/18 2248)    Mobility walks with device

## 2018-12-25 NOTE — Progress Notes (Addendum)
Pharmacy Antibiotic Note  Joyce [name] is a 85 y.o. female admitted on 12/24/2018 with pneumonia.  Pharmacy has been consulted for vancomycin and Zosyn dosing.  Plan: Zosyn 3.375g IV q8h (4 hour infusion).   Vancomycin 1000 mg IV Q 36 hrs. Goal AUC 400-550. Expected AUC: 505.3 SCr used: 0.8  Will order SCr for tomorrow to continue to monitor renal function.   Height: 5' (152.4 cm) Weight: 121 lb (54.9 kg) IBW/kg (Calculated) : 45.5  Temp (24hrs), Avg:97.6 F (36.4 C), Min:97.6 F (36.4 C), Max:97.6 F (36.4 C)  Recent Labs  Lab 12/24/18 1645 12/24/18 2040 12/25/18 0754  WBC 8.9  --  7.5  CREATININE 0.44  --  0.37*  LATICACIDVEN 1.6 1.7  --     Estimated Creatinine Clearance: 37.8 mL/min (A) (by C-G formula based on SCr of 0.37 mg/dL (L)).    Allergies  Allergen Reactions  . No Known Allergies     verified    Antimicrobials this admission: Zosyn 2/20, 2/21 >> Vanc 2/20, 2/21 >>  Dose adjustments this admission:   Microbiology results: BCx x2 NGTD UCx collected MRSA PCR ordered Flu neg   Thank you for allowing pharmacy to be a part of this patient's care.  Rocky Morel 12/25/2018 8:26 AM

## 2018-12-25 NOTE — Progress Notes (Addendum)
[place] at [place] NAME: Decarla Siemen    MR#:  [id number]  DATE OF BIRTH:  [birth year]  SUBJECTIVE:   Patient states she is doing okay this morning.  She feels like her shortness of breath has improved.  She was transitioned off BiPAP onto 3 L nasal cannula today.  She denies any fevers or chills.  She endorses cough.  REVIEW OF SYSTEMS:  Review of Systems  Constitutional: Negative for chills and fever.  HENT: Negative for congestion and sore throat.   Eyes: Negative for blurred vision and double vision.  Respiratory: Positive for cough and shortness of breath.   Cardiovascular: Negative for chest pain and palpitations.  Gastrointestinal: Negative for nausea and vomiting.  Genitourinary: Negative for dysuria and frequency.  Musculoskeletal: Negative for back pain and neck pain.  Neurological: Negative for dizziness and headaches.  Psychiatric/Behavioral: Negative for depression. The patient is not nervous/anxious.     DRUG ALLERGIES:   Allergies  Allergen Reactions  . No Known Allergies     verified   VITALS:  Blood pressure 117/67, pulse 85, temperature 97.6 F (36.4 C), temperature source Oral, resp. rate 20, height 5' (1.524 m), weight 54.9 kg, SpO2 96 %. PHYSICAL EXAMINATION:  Physical Exam  GENERAL: Laying in the bed with no acute distress.  HEENT: Head atraumatic, normocephalic. Pupils equal, round, reactive to light and accommodation. No scleral icterus. Extraocular muscles intact. Oropharynx and nasopharynx clear.  NECK:  Supple, no jugular venous distention. No thyroid enlargement. LUNGS: + Diffuse rhonchi throughout all lung fields.  No wheezes or crackles.  No use of accessory muscles of respiration.  CARDIOVASCULAR: RRR, S1, S2 normal. No murmurs, rubs, or gallops.  ABDOMEN: Soft, nontender, nondistended. Bowel sounds present.  EXTREMITIES: No, cyanosis, or clubbing. + Trace pedal edema. NEUROLOGIC: CN 2-12 intact,  no focal deficits. 5/5 muscle strength throughout all extremities. Sensation intact throughout. Gait not checked.  PSYCHIATRIC: The patient is alert and oriented x 3.  SKIN: No obvious rash, lesion, or ulcer.   LABORATORY PANEL:  Female CBC Recent Labs  Lab 12/25/18 0754  WBC 7.5  HGB 13.0  HCT 39.3  PLT 360   ------------------------------------------------------------------------------------------------------------------ Chemistries  Recent Labs  Lab 12/24/18 1645 12/25/18 0754  NA 126* 130*  K 3.5 3.7  CL 83* 89*  CO2 32 32  GLUCOSE 130* 107*  BUN 16 11  CREATININE 0.44 0.37*  CALCIUM 9.3 8.2*  AST 75*  --   ALT 74*  --   ALKPHOS 79  --   BILITOT 0.5  --    RADIOLOGY:  Dg Chest 2 View  Result Date: 12/24/2018 CLINICAL DATA:  85 year old female with a history of sepsis EXAM: CHEST - 2 VIEW COMPARISON:  08/24/2018 FINDINGS: Cardiomediastinal silhouette unchanged with cardiomegaly. Calcifications of the aortic arch. New opacities at the bilateral lung bases with obscuration of the hemidiaphragms and heart borders. Greater on the right. Opacity involves the middle lobe on the lateral view. Pleural effusion present. Osteopenia. Degenerative changes of the spine. Degenerative changes of the shoulders. IMPRESSION: Multifocal pneumonia, involving bilateral lower lobes and the right middle lobe. Likely parapneumonic effusions. Aspiration not excluded given the distribution. Electronically Signed   By: Corrie Mckusick D.O.   On: 12/24/2018 17:02   US Venous Img Lower Unilateral Right  Result Date: 12/24/2018 CLINICAL DATA:  85 y/o  F; right lower extremity swelling. EXAM: RIGHT LOWER EXTREMITY VENOUS DOPPLER ULTRASOUND TECHNIQUE: Gray-scale sonography with graded compression,  as well as color Doppler and duplex ultrasound were performed to evaluate the lower extremity deep venous systems from the level of the common femoral vein and including the common femoral, femoral, profunda  femoral, popliteal and calf veins including the posterior tibial, peroneal and gastrocnemius veins when visible. The superficial great saphenous vein was also interrogated. Spectral Doppler was utilized to evaluate flow at rest and with distal augmentation maneuvers in the common femoral, femoral and popliteal veins. COMPARISON:  None. FINDINGS: Contralateral Common Femoral Vein: Respiratory phasicity is normal and symmetric with the symptomatic side. No evidence of thrombus. Normal compressibility. Common Femoral Vein: No evidence of thrombus. Normal compressibility, respiratory phasicity and response to augmentation. Saphenofemoral Junction: No evidence of thrombus. Normal compressibility and flow on color Doppler imaging. Profunda Femoral Vein: No evidence of thrombus. Normal compressibility and flow on color Doppler imaging. Femoral Vein: No evidence of thrombus. Normal compressibility, respiratory phasicity and response to augmentation. Popliteal Vein: No evidence of thrombus. Normal compressibility, respiratory phasicity and response to augmentation. Calf Veins: No evidence of thrombus. Normal compressibility and flow on color Doppler imaging. Superficial Great Saphenous Vein: No evidence of thrombus. Normal compressibility. Venous Reflux:  None. Other Findings:  None. IMPRESSION: No evidence of deep venous thrombosis. Electronically Signed   By: Kristine Garbe M.D.   On: 12/24/2018 22:48   ASSESSMENT AND PLAN:   Acute hypoxic and hypercapnic respiratory failure secondary to multifocal pneumonia and COPD exacerbation- clinically improving.  Off BiPAP, on 3 L O2 by nasal cannula. -Continue IV  Zosyn, d/c vanc for negative PCR -Continue IV Solu-Medrol -Wean O2 as able -Cough meds as needed -Continue home inhalers  Hyponatremia- appears to be chronic in nature.  Sodium improved from 126 > 130 today. -Continue IV fluids  Generalized anxiety disorder-stable -Continue home  Ativan  Hypertension- blood pressure normal -Continue home enalapril and diltiazem   Hyperlipidemia- stable -Continue home statin  Postoperative hypothyroidism-stable -Continue home Synthroid   All the records are reviewed and case discussed with Care Management/Social Worker. Management plans discussed with the patient, family and they are in agreement.  CODE STATUS: Full Code  TOTAL TIME TAKING CARE OF THIS PATIENT: 35 minutes.   More than 50% of the time was spent in counseling/coordination of care: YES  POSSIBLE D/C IN 2-3 DAYS, DEPENDING ON CLINICAL CONDITION.   Berna Spare Porchia Sinkler M.D on 12/25/2018 at 2:10 PM  Between 7am to 6pm - Pager [phone]  After 6pm go to [email or web address] - password [email or web address] [facility]  [facility] [place] [facility]  Office  [phone]  CC: Primary care physician; Jerrol Banana., MD  Note: This dictation was prepared with Dragon dictation along with smaller phrase technology. Any transcriptional errors that result from this process are unintentional.

## 2018-12-25 NOTE — ED Notes (Signed)
Pt ate all of breakfast. Given coffee.  Family at bedside visiting.

## 2018-12-25 NOTE — Progress Notes (Signed)
   12/25/18 1300  Clinical Encounter Type  Visited With Patient  Visit Type Initial  Referral From Nurse  Spiritual Encounters  Spiritual Needs Prayer;Emotional  Ch responded to an OR requesting a prayer and an AD. Pt shared how she lost her two sons, husband, and father to cancer. Pt also expressed her gratitude to God's blessings and her concern for her son who does not have faith. Ch prayed with her for her healing and for her son and educated the pt on an AD.

## 2018-12-25 NOTE — ED Notes (Signed)
purwick remains in place. NAD. Unlabored. Waiting on admit bed.

## 2018-12-25 NOTE — Progress Notes (Signed)
Per MD, Ok to trial patient on 2L [place]. SAT at 94%. Bipap on standby in room.

## 2018-12-25 NOTE — ED Notes (Signed)
Pt unlabored on 3 L [place]. Wakes to voice. ronchi present bilateral.  Oriented to person place and time. No needs at this time. purwick in place.

## 2018-12-26 LAB — URINE CULTURE: Culture: NO GROWTH

## 2018-12-26 LAB — CBC
HCT: 38 % (ref 36.0–46.0)
Hemoglobin: 12.3 g/dL (ref 12.0–15.0)
MCH: 30 pg (ref 26.0–34.0)
MCHC: 32.4 g/dL (ref 30.0–36.0)
MCV: 92.7 fL (ref 80.0–100.0)
Platelets: 403 10*3/uL — ABNORMAL HIGH (ref 150–400)
RBC: 4.1 MIL/uL (ref 3.87–5.11)
RDW: 12.1 % (ref 11.5–15.5)
WBC: 8.8 10*3/uL (ref 4.0–10.5)
nRBC: 0 % (ref 0.0–0.2)

## 2018-12-26 LAB — BASIC METABOLIC PANEL
Anion gap: 4 — ABNORMAL LOW (ref 5–15)
BUN: 11 mg/dL (ref 8–23)
CO2: 35 mmol/L — AB (ref 22–32)
Calcium: 7.6 mg/dL — ABNORMAL LOW (ref 8.9–10.3)
Chloride: 95 mmol/L — ABNORMAL LOW (ref 98–111)
Creatinine, Ser: 0.49 mg/dL (ref 0.44–1.00)
GFR calc Af Amer: >60 mL/min (ref >60)
GFR calc non Af Amer: >60 mL/min (ref >60)
Glucose, Bld: 156 mg/dL — ABNORMAL HIGH (ref 70–99)
POTASSIUM: 3.6 mmol/L (ref 3.5–5.1)
Sodium: 134 mmol/L — ABNORMAL LOW (ref 135–145)

## 2018-12-26 MED ORDER — METHYLPREDNISOLONE SODIUM SUCC 40 MG IJ SOLR
40.0000 mg | Freq: Two times a day (BID) | INTRAMUSCULAR | Status: DC
  Administered 2018-12-26 – 2018-12-27 (×2): 40 mg via INTRAVENOUS
  Filled 2018-12-26 (×2): qty 1

## 2018-12-26 MED ORDER — SODIUM CHLORIDE 0.9 % IV SOLN
INTRAVENOUS | Status: DC | PRN
  Administered 2018-12-26: 21:00:00 10 mL/h via INTRAVENOUS
  Administered 2018-12-27: 06:00:00 via INTRAVENOUS

## 2018-12-26 NOTE — Progress Notes (Signed)
[place] at [place] NAME: Joyce [name]    MR#:  [id number]  DATE OF BIRTH:  1928/08/08  SUBJECTIVE:   Patient states she is doing okay this morning.  Still on 3 L of oxygen Short winded with exertion.  Daughter and son-in-law at bedside she denies any fevers or chills.    REVIEW OF SYSTEMS:  Review of Systems  Constitutional: Negative for chills and fever.  HENT: Negative for congestion and sore throat.   Eyes: Negative for blurred vision and double vision.  Respiratory: Positive for cough and shortness of breath.   Cardiovascular: Negative for chest pain and palpitations.  Gastrointestinal: Negative for nausea and vomiting.  Genitourinary: Negative for dysuria and frequency.  Musculoskeletal: Negative for back pain and neck pain.  Neurological: Negative for dizziness and headaches.  Psychiatric/Behavioral: Negative for depression. The patient is not nervous/anxious.     DRUG ALLERGIES:   Allergies  Allergen Reactions  . No Known Allergies     verified   VITALS:  Blood pressure (!) 109/52, pulse 94, temperature 98.2 F (36.8 C), temperature source Oral, resp. rate 18, height 5' (1.524 m), weight 54.9 kg, SpO2 93 %. PHYSICAL EXAMINATION:  Physical Exam  GENERAL: Laying in the bed with no acute distress.  HEENT: Head atraumatic, normocephalic. Pupils equal, round, reactive to light and accommodation. No scleral icterus. Extraocular muscles intact. Oropharynx and nasopharynx clear.  NECK:  Supple, no jugular venous distention. No thyroid enlargement. LUNGS: + Diffuse normal rhonchi throughout all lung fields.  No wheezes or crackles.  No use of accessory muscles of respiration.  CARDIOVASCULAR: RRR, S1, S2 normal. No murmurs, rubs, or gallops.  ABDOMEN: Soft, nontender, nondistended. Bowel sounds present.  EXTREMITIES: No, cyanosis, or clubbing. + Trace pedal edema. NEUROLOGIC:  Awake, alert and oriented x3. Sensation intact  throughout. Gait not checked.  PSYCHIATRIC: The patient is alert and oriented x 3.  SKIN: No obvious rash, lesion, or ulcer.   LABORATORY PANEL:  Female CBC Recent Labs  Lab 12/26/18 0430  WBC 8.8  HGB 12.3  HCT 38.0  PLT 403*   ------------------------------------------------------------------------------------------------------------------ Chemistries  Recent Labs  Lab 12/24/18 1645  12/26/18 0430  NA 126*   < > 134*  K 3.5   < > 3.6  CL 83*   < > 95*  CO2 32   < > 35*  GLUCOSE 130*   < > 156*  BUN 16   < > 11  CREATININE 0.44   < > 0.49  CALCIUM 9.3   < > 7.6*  AST 75*  --   --   ALT 74*  --   --   ALKPHOS 79  --   --   BILITOT 0.5  --   --    < > = values in this interval not displayed.   RADIOLOGY:  No results found. ASSESSMENT AND PLAN:   Acute hypoxic and hypercapnic respiratory failure secondary to multifocal pneumonia and COPD exacerbation- clinically improving.  Off BiPAP, on 3 L O2 by nasal cannula. -Continue IV  Zosyn, d/c vanc for negative PCR -Continue IV Solu-Medrol tapering -Wean O2 as able -Cough meds as needed -Continue home inhalers  Hyponatremia- appears to be chronic in nature.  Sodium improved from 126 > 130 today. -Provided IV fluids  Generalized anxiety disorder-stable -Continue home Ativan  Hypertension- blood pressure normal -Continue home enalapril and diltiazem   Hyperlipidemia- stable -Continue home statin  Postoperative hypothyroidism-stable -Continue home Synthroid  Generalized weakness physical therapy recommending home health PT and a 5 inch rolling walker  All the records are reviewed and case discussed with Care Management/Social Worker. Management plans discussed with the patient, family and they are in agreement.  CODE STATUS: Full Code  TOTAL TIME TAKING CARE OF THIS PATIENT: 35 minutes.   More than 50% of the time was spent in counseling/coordination of care: YES  POSSIBLE D/C IN 1  DAYS, DEPENDING ON  CLINICAL CONDITION.   Nicholes Mango M.D on 12/26/2018 at 2:27 PM  Between 7am to 6pm - Pager - [phone]  After 6pm go to [email or web address] - password [email or web address] [facility]  [facility] [place] [facility]  Office  [phone]  CC: Primary care physician; Jerrol Banana., MD  Note: This dictation was prepared with Dragon dictation along with smaller phrase technology. Any transcriptional errors that result from this process are unintentional.

## 2018-12-26 NOTE — Evaluation (Signed)
Physical Therapy Evaluation Patient Details Name: Joyce [name] MRN: [id number] DOB: 1928-07-17 Today's Date: 12/26/2018   History of Present Illness  Pt is an 85 year old female admitted for multifocal pneumonia following c/o AMS, hypoxia, weakness and hypotension.  PMH includes COPD, recent UTI, thyroid CA and asthma.  Clinical Impression  Pt is an 85 year old female who lives in a one story home with her daughter.  Pt uses a [id number] and is a household ambulator at baseline.  She was able to perform bed mobility mod I and sit at EOB with fair posture.  Pt became SOB and very anxious upon first sitting.  Very kyphotic posture noted and pt is very limited in shoulder ROM and hand and shoulder strength.  Pt was able to follow education for deep breathing and increase O2 sats from 87% to 92%.  Pt able to perform standing pivot transfer with close supervision and good use of RW.  Pt not appropriate to ambulate at this time due to desaturation and pt state of anxiety.   She will continue to benefit from skilled PT with focus on strength, tolerance to activity and safe functional mobility.    Follow Up Recommendations Home health PT;Supervision for mobility/OOB    Equipment Recommendations  Rolling walker with 5" wheels    Recommendations for Other Services       Precautions / Restrictions Precautions Precautions: Fall Restrictions Weight Bearing Restrictions: No      Mobility  Bed Mobility Overal bed mobility: Modified Independent             General bed mobility comments: Increased time and HOB elevated  Transfers Overall transfer level: Needs assistance Equipment used: Rolling walker (2 wheeled) Transfers: Sit to/from Stand Sit to Stand: Supervision         General transfer comment: Pt able to stand from slightly elevated bedside without difficulty and maneuver RW to sit in chair.  Pt's O2 sats too low to assess ambulation at this time.  87% in sitting which pt was able to  recover to 92% with 2 min of deep breathing.  PT assisted pt in transfer to chair so that she could sit upright.  PT open to education concerning importance of this for lung health.  Ambulation/Gait                Stairs            Wheelchair Mobility    Modified Rankin (Stroke Patients Only)       Balance                                             Pertinent Vitals/Pain Pain Assessment: No/denies pain    Home Living Family/patient expects to be discharged to:: Private residence Living Arrangements: Children(Daughter) Available Help at Discharge: Family Type of Home: House Home Access: Stairs to enter Entrance Stairs-Rails: Can reach both Entrance Stairs-Number of Steps: 2 Home Layout: One level Home Equipment: Walker - 4 wheels      Prior Function Level of Independence: Needs assistance   Gait / Transfers Assistance Needed: Ambulation limited to household mostly but pt stated that she does take 15 min walks around house sometimes.  ADL's / Homemaking Assistance Needed: Assistance with overhead activity due to impaired shoulder mobility.        Hand Dominance  Extremity/Trunk Assessment   Upper Extremity Assessment Upper Extremity Assessment: Generalized weakness(Pt wrist and shoulder mobility limited with visible deformities in hands and shoulders.  Pt able to flex/extend at elbows with 4-/5 strength.  Able to use RW sufficiently.)    Lower Extremity Assessment Lower Extremity Assessment: Generalized weakness    Cervical / Trunk Assessment Cervical / Trunk Assessment: Kyphotic(Severely kyphotic)  Communication   Communication: No difficulties  Cognition Arousal/Alertness: Awake/alert;Suspect due to medications Behavior During Therapy: [facility] for tasks assessed/performed;Anxious Overall Cognitive Status: Within Functional Limits for tasks assessed                                 General Comments: Pt very  talkative and RN stating that this may be due to pt taking steroids at this time.  Required redirection but was able to follow all directions.      General Comments      Exercises Other Exercises Other Exercises: Education regarding deep breathing exercises and time to assess vitals. x11 min   Assessment/Plan    PT Assessment Patient needs continued PT services  PT Problem List Decreased strength;Decreased mobility;Decreased balance;Decreased knowledge of use of DME;Decreased activity tolerance       PT Treatment Interventions DME instruction;Therapeutic activities;Gait training;Patient/family education;Therapeutic exercise;Balance training;Functional mobility training;Stair training    PT Goals (Current goals can be found in the Care Plan section)  Acute Rehab PT Goals Patient Stated Goal: To return home and walk outside for exercise. PT Goal Formulation: With patient Time For Goal Achievement: 01/09/19 Potential to Achieve Goals: Good    Frequency Min 2X/week   Barriers to discharge        Co-evaluation               AM-PAC PT "6 Clicks" Mobility  Outcome Measure Help needed turning from your back to your side while in a flat bed without using bedrails?: None Help needed moving from lying on your back to sitting on the side of a flat bed without using bedrails?: None Help needed moving to and from a bed to a chair (including a wheelchair)?: A Little Help needed standing up from a chair using your arms (e.g., wheelchair or bedside chair)?: A Little Help needed to walk in hospital room?: A Little Help needed climbing 3-5 steps with a railing? : A Little 6 Click Score: 20    End of Session Equipment Utilized During Treatment: Gait belt;Oxygen Activity Tolerance: Patient limited by fatigue(Limited by O2 desat to upper 80% range when sitting upright.) Patient left: in chair;with chair alarm set;with call bell/phone within reach   PT Visit Diagnosis: Unsteadiness on  feet (R26.81);Muscle weakness (generalized) (M62.81);Difficulty in walking, not elsewhere classified (R26.2)    Time: 4818-5631 PT Time Calculation (min) (ACUTE ONLY): 29 min   Charges:   PT Evaluation $PT Eval Low Complexity: 1 Low PT Treatments $Therapeutic Activity: 8-22 mins        Roxanne Gates, PT, DPT   Roxanne Gates 12/26/2018, 11:53 AM

## 2018-12-26 NOTE — Progress Notes (Signed)
PT Cancellation Note  Patient Details Name: Joyce [name] MRN: [id number] DOB: February 08, 1928   Cancelled Treatment:    Reason Eval/Treat Not Completed: Patient declined, no reason specified.  Order received and chart reviewed.  Pt requested that PT return in 1 hr to allow her breakfast time to settle.  Will re-attempt when time allows.  Roxanne Gates, PT, DPT  Roxanne Gates 12/26/2018, 9:57 AM

## 2018-12-27 MED ORDER — PREDNISONE 10 MG (21) PO TBPK: 10.0000 mg | ORAL_TABLET | Freq: Every day | ORAL | 0 refills | Status: DC

## 2018-12-27 MED ORDER — AMOXICILLIN-POT CLAVULANATE 875-125 MG PO TABS
1.0000 | ORAL_TABLET | Freq: Two times a day (BID) | ORAL | Status: DC
  Administered 2018-12-27: 1 via ORAL
  Filled 2018-12-27: qty 1

## 2018-12-27 MED ORDER — ALBUTEROL SULFATE HFA 108 (90 BASE) MCG/ACT IN AERS: 1.0000 | INHALATION_SPRAY | RESPIRATORY_TRACT | 0 refills | Status: DC | PRN

## 2018-12-27 MED ORDER — FLUTICASONE-SALMETEROL 250-50 MCG/DOSE IN AEPB: 1.0000 | INHALATION_SPRAY | Freq: Two times a day (BID) | RESPIRATORY_TRACT | 0 refills | Status: DC

## 2018-12-27 MED ORDER — BENZONATATE 200 MG PO CAPS: 200.0000 mg | ORAL_CAPSULE | Freq: Three times a day (TID) | ORAL | 0 refills | Status: DC | PRN

## 2018-12-27 MED ORDER — AMOXICILLIN-POT CLAVULANATE 875-125 MG PO TABS: 1.0000 | ORAL_TABLET | Freq: Two times a day (BID) | ORAL | 0 refills | Status: DC

## 2018-12-27 NOTE — Discharge Planning (Signed)
Patient IV removed.  RN assessment and VS revealed stability for DC to home with [facility].  Discharge papers given, explained and educated.  Informed of suggested FU appt and family agreed to call to set up since discharging on Sun.  All scripts printed and given.  Once ready, wheeled to front and family transporting home via car.

## 2018-12-27 NOTE — Care Management Note (Signed)
Case Management Note  Patient Details  Name: SWATI GRANBERRY MRN: [id number] Date of Birth: 07-09-1928  Subjective/Objective:  Patient to be discharged per MD order. Orders in place for home health services. RNCM spoke with patient and daughter [name] about this as PT has recommended home health. CMS Medicare.gov Compare Post Acute Care list reviewed with patient and they have no preference of agency. Referral placed with Malachy Mood for PT,RN and aide. Orders in place for DME rolling walker but per them they already have one. Family to transport no further RNCM needs.                   Action/Plan:   Expected Discharge Date:  12/27/18               Expected Discharge Plan:  [place]  In-House Referral:     Discharge planning Services  CM Consult  Post Acute Care Choice:  Home Health Choice offered to:  Patient, Adult Children  DME Arranged:    DME Agency:     HH Arranged:  RN, PT, Nurse's Aide [place] Agency:  [facility]  Status of Service:  Completed, signed off  If discussed at [place] of Stay Meetings, dates discussed:    Additional Comments:  Latanya Maudlin, RN 12/27/2018, 9:58 AM

## 2018-12-27 NOTE — Discharge Summary (Signed)
[facility] NAME: Joyce [name]    MR#:  [id number]  DATE OF BIRTH:  May 03, 1928  DATE OF ADMISSION:  12/24/2018 ADMITTING PHYSICIAN: Sela Hua, MD  DATE OF DISCHARGE: 12/27/18   PRIMARY CARE PHYSICIAN: Jerrol Banana., MD    ADMISSION DIAGNOSIS:  Hyponatremia [E87.1] Acute respiratory failure with hypercapnia (HCC) [J96.02] Multifocal pneumonia [J18.9] Sepsis, due to unspecified organism, unspecified whether acute organ dysfunction present ([place]) [A41.9]  DISCHARGE DIAGNOSIS:  Principal Problem:   Multifocal pneumonia Active Problems:   Generalized anxiety disorder   Hyperlipidemia   Postoperative hypothyroidism   GERD (gastroesophageal reflux disease)   COPD with acute exacerbation ([place])   Acute respiratory failure with hypoxia and hypercapnia ([place])   SECONDARY DIAGNOSIS:   Past Medical History:  Diagnosis Date  . Anxiety   . Arthritis   . Asthma   . Asthma   . Breast cancer ([place])   . Cancer ([place])   . COPD (chronic obstructive pulmonary disease) ([place])   . Enlarged aorta ([place])   . GERD (gastroesophageal reflux disease)   . High cholesterol   . Hx of skin cancer, basal cell   . Hyperlipemia   . Hypertension   . PUD (peptic ulcer disease)   . Thyroid cancer [facility])     HOSPITAL COURSE:   HPI  Joyce [name]  is a 85 y.o. female who presents with chief complaint as above.  Patient presents to the ED with significant shortness of breath.  She has been treated recently for UTI.  Here in the ED today she is found to have multifocal pneumonia.  She has COPD and also seems to be in exacerbation with wheezing in addition to her shortness of breath.  She required BiPAP in the ED due to hypoxia and hypercapnia.  Hospitalist were called for admission   Acute hypoxic and hypercapnic respiratory failure secondary to multifocal pneumonia and COPD exacerbation- clinically improving.  Off BiPAP, on 3 L O2 by nasal  cannula weaned off to room air -Clinically improved with IV  Zosyn, d/c vanc for negative PCR.  Discharge patient with p.o. Augmentin -Continue IV Solu-Medrol tapering with p.o. prednisone -Cough meds as needed -Continue home inhalers  Hyponatremia- appears to be chronic in nature.  Sodium improved from 126 > 130 > 134 today. -Provided IV fluids  Generalized anxiety disorder-stable -Continue home Ativan  Hypertension- blood pressure normal -Continue home enalapril and diltiazem  Hyperlipidemia- stable -Continue home statin  Postoperative hypothyroidism-stable -Continue home Synthroid   Generalized weakness physical therapy recommending home health PT and a 5 inch rolling walker  Patient is feeling better and wants to go home.  Daughter and son-in-law at bedside are agreeable DISCHARGE CONDITIONS:   fair  CONSULTS OBTAINED:     PROCEDURES  None   DRUG ALLERGIES:   Allergies  Allergen Reactions  . [name] Known Allergies     verified    DISCHARGE MEDICATIONS:   Allergies as of 12/27/2018      Reactions   [name] Known Allergies    verified      Medication List    STOP taking these medications   cephALEXin 500 MG capsule Commonly known as:  KEFLEX   doxycycline 100 MG tablet Commonly known as:  VIBRA-TABS   WIXELA INHUB 500-50 MCG/DOSE Aepb Generic drug:  Fluticasone-Salmeterol Replaced by:  Fluticasone-Salmeterol 250-50 MCG/DOSE Aepb     TAKE these medications   acetaminophen 325 MG tablet Commonly known  as:  TYLENOL Take 650 mg by mouth every 6 (six) hours as needed.   acidophilus Caps capsule Take 1 capsule by mouth daily.   albuterol 108 (90 Base) MCG/ACT inhaler Commonly known as:  PROVENTIL HFA;VENTOLIN HFA Inhale 1-2 puffs into the lungs every 4 (four) hours as needed for wheezing or shortness of breath. What changed:    how much to take  when to take this  reasons to take this   alendronate 70 MG tablet Commonly known as:   FOSAMAX Take 1 tablet (70 mg total) by mouth once a week. Take with a full glass of water on an empty stomach.   amoxicillin-clavulanate 875-125 MG tablet Commonly known as:  AUGMENTIN Take 1 tablet by mouth every 12 (twelve) hours.   atorvastatin 10 MG tablet Commonly known as:  LIPITOR Take 1 tablet (10 mg total) by mouth daily.   benzonatate 200 MG capsule Commonly known as:  TESSALON Take 1 capsule (200 mg total) by mouth 3 (three) times daily as needed for cough.   calcium carbonate 500 MG chewable tablet Commonly known as:  TUMS - dosed in mg elemental calcium Chew 2 tablets by mouth daily.   cholecalciferol 1000 units tablet Commonly known as:  VITAMIN D Take 2,000 Units by mouth daily.   diltiazem 120 MG 24 hr capsule Commonly known as:  DILACOR XR Take 2 capsules by mouth every morning, and 1 capsule every evening.   enalapril 20 MG tablet Commonly known as:  VASOTEC Take 1 tablet (20 mg total) by mouth daily.   feeding supplement (ENSURE ENLIVE) Liqd Take 237 mLs by mouth 2 (two) times daily between meals.   fesoterodine 8 MG Tb24 tablet Commonly known as:  TOVIAZ Take 1 tablet (8 mg total) by mouth daily.   Fluticasone-Salmeterol 250-50 MCG/DOSE Aepb Commonly known as:  ADVAIR DISKUS Inhale 1 puff into the lungs 2 (two) times daily. Replaces:  WIXELA INHUB 500-50 MCG/DOSE Aepb   levothyroxine 150 MCG tablet Commonly known as:  SYNTHROID, LEVOTHROID Take 1 tablet (150 mcg total) by mouth daily before breakfast.   LORazepam 0.5 MG tablet Commonly known as:  ATIVAN Take 0.5 tablets (0.25 mg total) by mouth 3 (three) times daily as needed for anxiety.   mirtazapine 7.5 MG tablet Commonly known as:  REMERON Take 1 tablet (7.5 mg total) by mouth at bedtime.   multivitamin with minerals Tabs tablet Take 1 tablet by mouth daily.   predniSONE 10 MG (21) Tbpk tablet Commonly known as:  STERAPRED UNI-PAK 21 TAB Take 1 tablet (10 mg total) by mouth daily.  Take 6 tablets by mouth for 1 day followed by  5 tablets by mouth for 1 day followed by  4 tablets by mouth for 1 day followed by  3 tablets by mouth for 1 day followed by  2 tablets by mouth for 1 day followed by  1 tablet by mouth for a day and stop            Durable Medical Equipment  (From admission, onward)         Start     Ordered   12/26/18 1427  For home use only DME Walker rolling  Once    Comments:  Rolling walker with 5 inch wheels  Question:  Patient needs a walker to treat with the following condition  Answer:  Weakness   12/26/18 1426           DISCHARGE INSTRUCTIONS:   Follow-up with primary  care physician in 3 days Continue home health PT  DIET:  Cardiac diet   DISCHARGE CONDITION:  Fair  ACTIVITY:  Activity as tolerated  OXYGEN:  Home Oxygen: [name].   Oxygen Delivery: room air  DISCHARGE LOCATION:  home   If you experience worsening of your admission symptoms, develop shortness of breath, life threatening emergency, suicidal or homicidal thoughts you must seek medical attention immediately by calling 911 or calling your MD immediately  if symptoms less severe.  You Must read complete instructions/literature along with all the possible adverse reactions/side effects for all the Medicines you take and that have been prescribed to you. Take any new Medicines after you have completely understood and accpet all the possible adverse reactions/side effects.   Please note  You were cared for by a hospitalist during your hospital stay. If you have any questions about your discharge medications or the care you received while you were in the hospital after you are discharged, you can call the unit and asked to speak with the hospitalist on call if the hospitalist that took care of you is not available. Once you are discharged, your primary care physician will handle any further medical issues. Please note that [name] REFILLS for any discharge medications will  be authorized once you are discharged, as it is imperative that you return to your primary care physician (or establish a relationship with a primary care physician if you do not have one) for your aftercare needs so that they can reassess your need for medications and monitor your lab values.     Today  Chief Complaint  Patient presents with  . Weakness  . Altered Mental Status   Patient is feeling much better cough is improving.  Feels comfortable to go home  ROS:  CONSTITUTIONAL: Denies fevers, chills. Denies any fatigue, weakness.  EYES: Denies blurry vision, double vision, eye pain. EARS, NOSE, THROAT: Denies tinnitus, ear pain, hearing loss. RESPIRATORY: Denies cough, wheeze, shortness of breath.  CARDIOVASCULAR: Denies chest pain, palpitations, edema.  GASTROINTESTINAL: Denies nausea, vomiting, diarrhea, abdominal pain. Denies bright red blood per rectum. GENITOURINARY: Denies dysuria, hematuria. ENDOCRINE: Denies nocturia or thyroid problems. HEMATOLOGIC AND LYMPHATIC: Denies easy bruising or bleeding. SKIN: Denies rash or lesion. MUSCULOSKELETAL: Denies pain in neck, back, shoulder, knees, hips or arthritic symptoms.  NEUROLOGIC: Denies paralysis, paresthesias.  PSYCHIATRIC: Denies anxiety or depressive symptoms.   VITAL SIGNS:  Blood pressure 122/64, pulse 84, temperature (!) 97.5 F (36.4 C), temperature source Oral, resp. rate 17, height 5' (1.524 m), weight 54.9 kg, SpO2 95 %.  I/O:    Intake/Output Summary (Last 24 hours) at 12/27/2018 1054 Last data filed at 12/27/2018 1027 Gross per 24 hour  Intake 484.95 ml  Output 400 ml  Net 84.95 ml    PHYSICAL EXAMINATION:  GENERAL:  85 y.o.-year-old patient lying in the bed with [name] acute distress.  EYES: Pupils equal, round, reactive to light and accommodation. [name] scleral icterus. Extraocular muscles intact.  HEENT: Head atraumatic, normocephalic. Oropharynx and nasopharynx clear.  NECK:  Supple, [name] jugular venous  distention. [name] thyroid enlargement, [name] tenderness.  LUNGS: Normal breath sounds bilaterally, [name] wheezing, rales,rhonchi or crepitation. [name] use of accessory muscles of respiration.  CARDIOVASCULAR: S1, S2 normal. [name] murmurs, rubs, or gallops.  ABDOMEN: Soft, non-tender, non-distended. Bowel sounds present. EXTREMITIES: [name] pedal edema, cyanosis, or clubbing.  NEUROLOGIC: Awake, alert and oriented x3. Sensation intact. Gait not checked.  PSYCHIATRIC: The patient is alert and oriented x 3.  SKIN:  [name] obvious rash, lesion, or ulcer.   DATA REVIEW:   CBC Recent Labs  Lab 12/26/18 0430  WBC 8.8  HGB 12.3  HCT 38.0  PLT 403*    Chemistries  Recent Labs  Lab 12/24/18 1645  12/26/18 0430  NA 126*   < > 134*  K 3.5   < > 3.6  CL 83*   < > 95*  CO2 32   < > 35*  GLUCOSE 130*   < > 156*  BUN 16   < > 11  CREATININE 0.44   < > 0.49  CALCIUM 9.3   < > 7.6*  AST 75*  --   --   ALT 74*  --   --   ALKPHOS 79  --   --   BILITOT 0.5  --   --    < > = values in this interval not displayed.    Cardiac Enzymes [name] results for input(s): TROPONINI in the last 168 hours.  Microbiology Results  Results for orders placed or performed during the hospital encounter of 12/24/18  Culture, blood (Routine x 2)     Status: None (Preliminary result)   Collection Time: 12/24/18  4:45 PM  Result Value Ref Range Status   Specimen Description BLOOD RIGHT ANTECUBITAL  Final   Special Requests   Final    BOTTLES DRAWN AEROBIC AND ANAEROBIC Blood Culture adequate volume   Culture   Final    [name] GROWTH 3 DAYS Performed at [facility], [address], [place], [place] [address]    Report Status PENDING  Incomplete  Urine culture     Status: None   Collection Time: 12/24/18  8:41 PM  Result Value Ref Range Status   Specimen Description   Final    URINE, CLEAN CATCH Performed at [facility], [address], [place], [place] [address]    Special Requests   Final    NONE Performed  at [facility], [address] [place], [place] [address]    Culture   Final    [name] GROWTH Performed at [facility], [place] [address] [place], [place] [address]    Report Status 12/26/2018 FINAL  Final  Culture, blood (Routine x 2)     Status: None (Preliminary result)   Collection Time: 12/24/18  8:57 PM  Result Value Ref Range Status   Specimen Description BLOOD BLOOD RIGHT FOREARM  Final   Special Requests   Final    BOTTLES DRAWN AEROBIC AND ANAEROBIC Blood Culture results may not be optimal due to an inadequate volume of blood received in culture bottles   Culture   Final    [name] GROWTH 3 DAYS Performed at [facility], [address], [place], [place] [address]    Report Status PENDING  Incomplete  MRSA PCR Screening     Status: None   Collection Time: 12/25/18  8:33 AM  Result Value Ref Range Status   MRSA by PCR NEGATIVE NEGATIVE Final    Comment:        The GeneXpert MRSA Assay (FDA approved for NASAL specimens only), is one component of a comprehensive MRSA colonization surveillance program. It is not intended to diagnose MRSA infection nor to guide or monitor treatment for MRSA infections. Performed at [facility], [address] [place],  [address]     RADIOLOGY:  Dg Chest 2 View  Result Date: 12/24/2018 CLINICAL DATA:  85 year old female with a  history of sepsis EXAM: CHEST - 2 VIEW COMPARISON:  08/24/2018 FINDINGS: Cardiomediastinal silhouette unchanged with cardiomegaly. Calcifications of the aortic arch. New opacities at the bilateral lung bases with obscuration of the hemidiaphragms and heart borders. Greater on the right. Opacity involves the middle lobe on the lateral view. Pleural effusion present. Osteopenia. Degenerative changes of the spine. Degenerative changes of the shoulders. IMPRESSION: Multifocal pneumonia, involving bilateral lower lobes and the right middle lobe. Likely parapneumonic effusions.  Aspiration not excluded given the distribution. Electronically Signed   By: Corrie Mckusick D.O.   On: 12/24/2018 17:02   US Venous Img Lower Unilateral Right  Result Date: 12/24/2018 CLINICAL DATA:  85 y/o  F; right lower extremity swelling. EXAM: RIGHT LOWER EXTREMITY VENOUS DOPPLER ULTRASOUND TECHNIQUE: Gray-scale sonography with graded compression, as well as color Doppler and duplex ultrasound were performed to evaluate the lower extremity deep venous systems from the level of the common femoral vein and including the common femoral, femoral, profunda femoral, popliteal and calf veins including the posterior tibial, peroneal and gastrocnemius veins when visible. The superficial great saphenous vein was also interrogated. Spectral Doppler was utilized to evaluate flow at rest and with distal augmentation maneuvers in the common femoral, femoral and popliteal veins. COMPARISON:  None. FINDINGS: Contralateral Common Femoral Vein: Respiratory phasicity is normal and symmetric with the symptomatic side. [name] evidence of thrombus. Normal compressibility. Common Femoral Vein: [name] evidence of thrombus. Normal compressibility, respiratory phasicity and response to augmentation. Saphenofemoral Junction: [name] evidence of thrombus. Normal compressibility and flow on color Doppler imaging. Profunda Femoral Vein: [name] evidence of thrombus. Normal compressibility and flow on color Doppler imaging. Femoral Vein: [name] evidence of thrombus. Normal compressibility, respiratory phasicity and response to augmentation. Popliteal Vein: [name] evidence of thrombus. Normal compressibility, respiratory phasicity and response to augmentation. Calf Veins: [name] evidence of thrombus. Normal compressibility and flow on color Doppler imaging. Superficial Great Saphenous Vein: [name] evidence of thrombus. Normal compressibility. Venous Reflux:  None. Other Findings:  None. IMPRESSION: [name] evidence of deep venous thrombosis. Electronically Signed   By: Kristine Garbe M.D.   On: 12/24/2018 22:48    EKG:   Orders placed or performed during the hospital encounter of 12/24/18  . ED EKG  . ED EKG      Management plans discussed with the patient, family and they are in agreement.  CODE STATUS:     Code Status Orders  (From admission, onward)         Start     Ordered   12/25/18 0238  Full code  Continuous     12/25/18 0238        Code Status History    Date Active Date Inactive Code Status Order ID Comments User Context   11/22/2016 1858 11/24/2016 1735 Full Code [id number]  Dustin Flock, MD Inpatient      TOTAL TIME TAKING CARE OF THIS PATIENT: 43  minutes.   Note: This dictation was prepared with Dragon dictation along with smaller phrase technology. Any transcriptional errors that result from this process are unintentional.   @[facility] @  on 12/27/2018 at 10:54 AM  Between 7am to 6pm - Pager - [phone]  After 6pm go to [email or web address] - password [email or web address] [facility]  [facility]  Office  [phone]  CC: Primary care physician; Jerrol Banana., MD

## 2018-12-27 NOTE — Discharge Instructions (Signed)
Follow-up with primary care physician in 3 days Continue home health PT

## 2018-12-28 ENCOUNTER — Telehealth: Payer: Self-pay

## 2018-12-28 NOTE — Telephone Encounter (Signed)
HFU not scheduled. 

## 2018-12-28 NOTE — Telephone Encounter (Signed)
Transition Care Management Follow-up Telephone Call  Date of discharge and from where: [facility] 12/27/18.  How have you been since you were released from the hospital? Doing ok, still weak but pt is up walking around with her walker. Still coughing and sputum is cloudy white, though daughter states pt always has sputum due to COPD. Declines fever, pain, SOB or n/v/d.  Any questions or concerns? Yes, pt is still confused. Daughter feels her confusion is better but is wondering when it will get better.  Items Reviewed:  Did the pt receive and understand the discharge instructions provided? Yes   Medications obtained and verified? Yes   Any new allergies since your discharge? No   Dietary orders reviewed? N/A  Do you have support at home? Yes   Other (ie: DME, Home Health, etc) PT and a home health aid.  Functional Questionnaire: (I = Independent and D = Dependent)  Bathing/Dressing- Has assistance getting in and out of shower.   Meal Prep- Not currently  Eating- I  Maintaining continence- I with urine leakage. Is on medication for this.  Transferring/Ambulation- Uses a walker.  Managing Meds- I   Follow up appointments reviewed:    PCP Hospital f/u appt confirmed? Yes  Scheduled to see Dr. Rosanna Randy on 12/31/18 @ 4:20 AM.  [facility] f/u appt confirmed? N/A   Are transportation arrangements needed? No   If their condition worsens, is the pt aware to call  their PCP or go to the ED? Yes  Was the patient provided with contact information for the PCP's office or ED? Yes  Was the pt encouraged to call back with questions or concerns? Yes

## 2018-12-29 DIAGNOSIS — Z7952 Long term (current) use of systemic steroids: Secondary | ICD-10-CM | POA: Diagnosis not present

## 2018-12-29 DIAGNOSIS — J440 Chronic obstructive pulmonary disease with acute lower respiratory infection: Secondary | ICD-10-CM | POA: Diagnosis not present

## 2018-12-29 DIAGNOSIS — M1990 Unspecified osteoarthritis, unspecified site: Secondary | ICD-10-CM | POA: Diagnosis not present

## 2018-12-29 DIAGNOSIS — J441 Chronic obstructive pulmonary disease with (acute) exacerbation: Secondary | ICD-10-CM | POA: Diagnosis not present

## 2018-12-29 DIAGNOSIS — E890 Postprocedural hypothyroidism: Secondary | ICD-10-CM | POA: Diagnosis not present

## 2018-12-29 DIAGNOSIS — J189 Pneumonia, unspecified organism: Secondary | ICD-10-CM | POA: Diagnosis not present

## 2018-12-29 DIAGNOSIS — F411 Generalized anxiety disorder: Secondary | ICD-10-CM | POA: Diagnosis not present

## 2018-12-29 DIAGNOSIS — I10 Essential (primary) hypertension: Secondary | ICD-10-CM | POA: Diagnosis not present

## 2018-12-29 DIAGNOSIS — E785 Hyperlipidemia, unspecified: Secondary | ICD-10-CM | POA: Diagnosis not present

## 2018-12-29 DIAGNOSIS — K219 Gastro-esophageal reflux disease without esophagitis: Secondary | ICD-10-CM | POA: Diagnosis not present

## 2018-12-29 LAB — CULTURE, BLOOD (ROUTINE X 2)
Culture: NO GROWTH
Culture: NO GROWTH
Special Requests: ADEQUATE

## 2018-12-30 DIAGNOSIS — J189 Pneumonia, unspecified organism: Secondary | ICD-10-CM | POA: Diagnosis not present

## 2018-12-30 DIAGNOSIS — J441 Chronic obstructive pulmonary disease with (acute) exacerbation: Secondary | ICD-10-CM | POA: Diagnosis not present

## 2018-12-30 DIAGNOSIS — M1990 Unspecified osteoarthritis, unspecified site: Secondary | ICD-10-CM | POA: Diagnosis not present

## 2018-12-30 DIAGNOSIS — J440 Chronic obstructive pulmonary disease with acute lower respiratory infection: Secondary | ICD-10-CM | POA: Diagnosis not present

## 2018-12-30 DIAGNOSIS — K219 Gastro-esophageal reflux disease without esophagitis: Secondary | ICD-10-CM | POA: Diagnosis not present

## 2018-12-30 DIAGNOSIS — I10 Essential (primary) hypertension: Secondary | ICD-10-CM | POA: Diagnosis not present

## 2018-12-31 ENCOUNTER — Telehealth: Payer: Self-pay | Admitting: Family Medicine

## 2018-12-31 ENCOUNTER — Other Ambulatory Visit: Payer: Self-pay

## 2018-12-31 ENCOUNTER — Ambulatory Visit (INDEPENDENT_AMBULATORY_CARE_PROVIDER_SITE_OTHER): Payer: Medicare Other | Admitting: Family Medicine

## 2018-12-31 ENCOUNTER — Encounter: Payer: Self-pay | Admitting: Family Medicine

## 2018-12-31 VITALS — BP 98/58 | HR 95 | Temp 97.6°F | Ht 60.0 in | Wt 116.2 lb

## 2018-12-31 DIAGNOSIS — J471 Bronchiectasis with (acute) exacerbation: Secondary | ICD-10-CM

## 2018-12-31 DIAGNOSIS — Z853 Personal history of malignant neoplasm of breast: Secondary | ICD-10-CM

## 2018-12-31 DIAGNOSIS — J189 Pneumonia, unspecified organism: Secondary | ICD-10-CM

## 2018-12-31 DIAGNOSIS — E440 Moderate protein-calorie malnutrition: Secondary | ICD-10-CM

## 2018-12-31 DIAGNOSIS — E210 Primary hyperparathyroidism: Secondary | ICD-10-CM

## 2018-12-31 DIAGNOSIS — J441 Chronic obstructive pulmonary disease with (acute) exacerbation: Secondary | ICD-10-CM

## 2018-12-31 MED ORDER — MIRTAZAPINE 15 MG PO TABS: 15.0000 mg | ORAL_TABLET | Freq: Every day | ORAL | 12 refills | Status: DC

## 2018-12-31 MED ORDER — MIRTAZAPINE 15 MG PO TABS: 7.5000 mg | ORAL_TABLET | Freq: Every day | ORAL | 12 refills | Status: DC

## 2018-12-31 NOTE — Progress Notes (Signed)
Patient: Joyce [name] Female    DOB: [birth year]   85 y.o.   MRN: [id number] Visit Date: 12/31/2018  Today's Provider: [name], MD   Chief Complaint  Patient presents with  . Hospitalization Follow-up    multifocal pneumonia   Subjective:     HPI   Follow up Hospitalization  Patient was admitted to [facility] on 12/24/18 and discharged on 12/27/18.  Transition of care visit She was treated for multifocal pneumonia. Treatment for this included IV Antibiotics. Telephone follow up was done on  She reports adequate compliance with treatment. She reports this condition is Improved. Cough is improved.  She has no shortness of breath since hospital discharge.  She is very slowly getting her energy back.  No appetite. ------------------------------------------------------------------------------------     Allergies  Allergen Reactions  . No Known Allergies     verified     Current Outpatient Medications:  .  acetaminophen (TYLENOL) 325 MG tablet, Take 650 mg by mouth every 6 (six) hours as needed., Disp: , Rfl:  .  acidophilus (RISAQUAD) CAPS capsule, Take 1 capsule by mouth daily., Disp: , Rfl:  .  albuterol (PROVENTIL HFA;VENTOLIN HFA) 108 (90 Base) MCG/ACT inhaler, Inhale 1-2 puffs into the lungs every 4 (four) hours as needed for wheezing or shortness of breath., Disp: 1 Inhaler, Rfl: 0 .  alendronate (FOSAMAX) 70 MG tablet, Take 1 tablet (70 mg total) by mouth once a week. Take with a full glass of water on an empty stomach., Disp: 4 tablet, Rfl: 12 .  amoxicillin-clavulanate (AUGMENTIN) 875-125 MG tablet, Take 1 tablet by mouth every 12 (twelve) hours., Disp: 10 tablet, Rfl: 0 .  atorvastatin (LIPITOR) 10 MG tablet, Take 1 tablet (10 mg total) by mouth daily., Disp: 90 tablet, Rfl: 3 .  benzonatate (TESSALON) 200 MG capsule, Take 1 capsule (200 mg total) by mouth 3 (three) times daily as needed for cough., Disp: 20 capsule, Rfl: 0 .  calcium carbonate (TUMS -  DOSED IN MG ELEMENTAL CALCIUM) 500 MG chewable tablet, Chew 2 tablets by mouth daily. , Disp: , Rfl:  .  cholecalciferol (VITAMIN D) 1000 units tablet, Take 2,000 Units by mouth daily., Disp: , Rfl:  .  diltiazem (DILACOR XR) 120 MG 24 hr capsule, Take 2 capsules by mouth every morning, and 1 capsule every evening., Disp: 270 capsule, Rfl: 3 .  enalapril (VASOTEC) 20 MG tablet, Take 1 tablet (20 mg total) by mouth daily., Disp: 30 tablet, Rfl: 12 .  feeding supplement, ENSURE ENLIVE, (ENSURE ENLIVE) LIQD, Take 237 mLs by mouth 2 (two) times daily between meals., Disp: 237 mL, Rfl: 12 .  fesoterodine (TOVIAZ) 8 MG TB24 tablet, Take 1 tablet (8 mg total) by mouth daily., Disp: 90 tablet, Rfl: 3 .  Fluticasone-Salmeterol (ADVAIR) 500-50 MCG/DOSE AEPB, Inhale 1 puff into the lungs daily., Disp: , Rfl:  .  levothyroxine (SYNTHROID, LEVOTHROID) 150 MCG tablet, Take 1 tablet (150 mcg total) by mouth daily before breakfast., Disp: 30 tablet, Rfl: 12 .  LORazepam (ATIVAN) 0.5 MG tablet, Take 0.5 tablets (0.25 mg total) by mouth 3 (three) times daily as needed for anxiety., Disp: 90 tablet, Rfl: 3 .  mirtazapine (REMERON) 7.5 MG tablet, Take 1 tablet (7.5 mg total) by mouth at bedtime., Disp: 30 tablet, Rfl: 12 .  Multiple Vitamin (MULTIVITAMIN WITH MINERALS) TABS tablet, Take 1 tablet by mouth daily., Disp: , Rfl:  .  predniSONE (STERAPRED UNI-PAK 21 TAB) 10 MG (21)  TBPK tablet, Take 1 tablet (10 mg total) by mouth daily. Take 6 tablets by mouth for 1 day followed by  5 tablets by mouth for 1 day followed by  4 tablets by mouth for 1 day followed by  3 tablets by mouth for 1 day followed by  2 tablets by mouth for 1 day followed by  1 tablet by mouth for a day and stop, Disp: 21 tablet, Rfl: 0 .  Fluticasone-Salmeterol (ADVAIR DISKUS) 250-50 MCG/DOSE AEPB, Inhale 1 puff into the lungs 2 (two) times daily. (Patient not taking: Reported on 12/31/2018), Disp: 1 each, Rfl: 0  Review of Systems  Constitutional:  Negative.   HENT: Positive for congestion.   Eyes: Negative.   Respiratory: Positive for cough (yellow mucus since 12/13/18).   Cardiovascular: Negative.   Gastrointestinal: Negative.   Endocrine: Negative.   Genitourinary: Negative.   Musculoskeletal: Negative.   Skin: Negative.   Allergic/Immunologic: Negative.   Neurological: Negative.   Hematological: Negative.   Psychiatric/Behavioral: Negative.  Negative for sleep disturbance.    Social History   Tobacco Use  . Smoking status: Never Smoker  . Smokeless tobacco: Never Used  Substance Use Topics  . Alcohol use: Yes    Alcohol/week: 1.0 standard drinks    Types: 1 Cans of beer per week    Comment: occasional beer, once a year      Objective:   BP (!) 98/58 (BP Location: Right Arm, Patient Position: Sitting, Cuff Size: Normal)   Pulse 95   Temp 97.6 F (36.4 C) (Oral)   Ht 5' (1.524 m)   Wt 116 lb 3.2 oz (52.7 kg)   SpO2 95%   BMI 22.69 kg/m  Vitals:   12/31/18 1653  BP: (!) 98/58  Pulse: 95  Temp: 97.6 F (36.4 C)  TempSrc: Oral  SpO2: 95%  Weight: 116 lb 3.2 oz (52.7 kg)  Height: 5' (1.524 m)     Physical Exam Vitals signs reviewed.  Constitutional:      Appearance: Normal appearance.  HENT:     Head: Normocephalic and atraumatic.     Right Ear: External ear normal.     Left Ear: External ear normal.     Nose: Nose normal.     Mouth/Throat:     Pharynx: Oropharynx is clear.  Eyes:     General: No scleral icterus. Cardiovascular:     Rate and Rhythm: Normal rate and regular rhythm.     Heart sounds: Normal heart sounds.  Pulmonary:     Effort: Pulmonary effort is normal.     Breath sounds: Rhonchi present.  Musculoskeletal:     Right lower leg: No edema.     Left lower leg: No edema.  Lymphadenopathy:     Cervical: No cervical adenopathy.  Skin:    General: Skin is warm and dry.  Neurological:     General: No focal deficit present.     Mental Status: She is alert.  Psychiatric:         Mood and Affect: Mood normal.        Behavior: Behavior normal.        Thought Content: Thought content normal.        Judgment: Judgment normal.         Assessment & Plan    1. Pneumonia of both lungs due to infectious organism, unspecified part of lung Multilobar pneumonia.  Patient clinically improving.  O2 sat today is 99% on room air.  Follow-up in  5 weeks and repeat chest x-ray.  2. COPD with acute exacerbation ([place])   3. Bronchiectasis with acute exacerbation ([place])   4. Primary hyperparathyroidism ([place])  5.s/p Thyroidectomy  6.Malnutrition Increase mirtazapine from 7.5 to 50 mg every night.    I have done the exam and reviewed the above chart and it is accurate to the best of my knowledge. Development worker, community has been used in this note in any air is in the dictation or transcription are unintentional.  [name], MD  [place]

## 2018-12-31 NOTE — Telephone Encounter (Signed)
Joyce [name] with [facility] called for Verbal orders for PT for patient   2 times a week for 5 weeks,  CB#  [phone]  Thanks [employer]

## 2018-12-31 NOTE — Telephone Encounter (Signed)
Advised 

## 2019-01-05 ENCOUNTER — Telehealth: Payer: Self-pay | Admitting: Family Medicine

## 2019-01-05 DIAGNOSIS — M1990 Unspecified osteoarthritis, unspecified site: Secondary | ICD-10-CM | POA: Diagnosis not present

## 2019-01-05 DIAGNOSIS — J441 Chronic obstructive pulmonary disease with (acute) exacerbation: Secondary | ICD-10-CM

## 2019-01-05 DIAGNOSIS — J440 Chronic obstructive pulmonary disease with acute lower respiratory infection: Secondary | ICD-10-CM | POA: Diagnosis not present

## 2019-01-05 DIAGNOSIS — J189 Pneumonia, unspecified organism: Secondary | ICD-10-CM | POA: Diagnosis not present

## 2019-01-05 DIAGNOSIS — K219 Gastro-esophageal reflux disease without esophagitis: Secondary | ICD-10-CM | POA: Diagnosis not present

## 2019-01-05 DIAGNOSIS — I10 Essential (primary) hypertension: Secondary | ICD-10-CM | POA: Diagnosis not present

## 2019-01-05 NOTE — Telephone Encounter (Signed)
Please review. Thanks!  

## 2019-01-05 NOTE — Telephone Encounter (Signed)
Tiffany, RN w/ [facility] [phone]  Reporting: Rubbing and wheezing in bilateral lung lobes  Thanks, [facility]

## 2019-01-06 ENCOUNTER — Other Ambulatory Visit: Payer: Self-pay

## 2019-01-06 ENCOUNTER — Ambulatory Visit
Admission: RE | Admit: 2019-01-06 | Discharge: 2019-01-06 | Disposition: A | Discharge status: Home / Self Care | Payer: Medicare Other | Attending: Family Medicine | Admitting: Family Medicine

## 2019-01-06 ENCOUNTER — Ambulatory Visit
Admission: RE | Admit: 2019-01-06 | Discharge: 2019-01-06 | Disposition: A | Discharge status: Home / Self Care | Payer: Medicare Other | Source: Ambulatory Visit | Attending: Family Medicine | Admitting: Family Medicine

## 2019-01-06 DIAGNOSIS — J441 Chronic obstructive pulmonary disease with (acute) exacerbation: Secondary | ICD-10-CM | POA: Diagnosis not present

## 2019-01-06 DIAGNOSIS — R05 Cough: Secondary | ICD-10-CM | POA: Insufficient documentation

## 2019-01-06 NOTE — Telephone Encounter (Signed)
If patient afebrile and in no acute distress breathing and pulse ox is okay will consider switching her inhalers to nebulizers.  That may help some.  May also need to go ahead repeat chest x-ray at this time.

## 2019-01-06 NOTE — Telephone Encounter (Signed)
Advised patient's daughter as below. We have the nebulizer kit here in the office. She will come by and sign form for the nebulizer. Order for CXR was placed.

## 2019-01-11 ENCOUNTER — Telehealth: Payer: Self-pay | Admitting: Family Medicine

## 2019-01-11 NOTE — Telephone Encounter (Signed)
Daughter has not heard about her mothers cxr from last Wednesday  CB#  [phone]  Thanks [employer]

## 2019-01-11 NOTE — Telephone Encounter (Signed)
Please review

## 2019-01-12 ENCOUNTER — Telehealth: Payer: Self-pay | Admitting: Family Medicine

## 2019-01-12 DIAGNOSIS — I10 Essential (primary) hypertension: Secondary | ICD-10-CM | POA: Diagnosis not present

## 2019-01-12 DIAGNOSIS — J189 Pneumonia, unspecified organism: Secondary | ICD-10-CM | POA: Diagnosis not present

## 2019-01-12 DIAGNOSIS — K219 Gastro-esophageal reflux disease without esophagitis: Secondary | ICD-10-CM | POA: Diagnosis not present

## 2019-01-12 DIAGNOSIS — J441 Chronic obstructive pulmonary disease with (acute) exacerbation: Secondary | ICD-10-CM | POA: Diagnosis not present

## 2019-01-12 DIAGNOSIS — J440 Chronic obstructive pulmonary disease with acute lower respiratory infection: Secondary | ICD-10-CM | POA: Diagnosis not present

## 2019-01-12 DIAGNOSIS — M1990 Unspecified osteoarthritis, unspecified site: Secondary | ICD-10-CM | POA: Diagnosis not present

## 2019-01-12 NOTE — Telephone Encounter (Signed)
Please review. Thanks!  

## 2019-01-12 NOTE — Telephone Encounter (Signed)
Also have her make sure is not a DVT in this debilitated patient as you may need to be seen.  For cellulitis will treat with Amoxil 875 twice daily for 1 week if not penicillin allergic

## 2019-01-12 NOTE — Telephone Encounter (Signed)
Pt's Daughter [name] is calling back on the status of the request. Please call [name] back today at:  [phone].  Thanks, [employer]

## 2019-01-12 NOTE — Telephone Encounter (Signed)
Seth Bake an RN with [facility] is seeing Mrs. Joyce [name] and states she has a plus two pitting edema on rt lower leg with warmth and redness/ probably cellulitis.  She suggest an order for an antibiotic and leg wraps if Dr. Rosanna Randy agrees.  Andrea's call back is [phone]  They use [facility] in [place]

## 2019-01-13 ENCOUNTER — Other Ambulatory Visit: Payer: Self-pay

## 2019-01-13 DIAGNOSIS — J449 Chronic obstructive pulmonary disease, unspecified: Secondary | ICD-10-CM

## 2019-01-13 MED ORDER — AMOXICILLIN 875 MG PO TABS: 875.0000 mg | ORAL_TABLET | Freq: Two times a day (BID) | ORAL | 0 refills | Status: DC

## 2019-01-13 NOTE — Telephone Encounter (Signed)
Advised daughter of results.  

## 2019-01-13 NOTE — Telephone Encounter (Signed)
Advised patient daughter and medication was sent into the pharmacy. Advised if not better will need OV for evaluation. She verbalized understanding.

## 2019-01-13 NOTE — Progress Notes (Signed)
Per Dr. Rosanna Randy, form for nebulizer was also sent home by [name] to be signed by patient. Husband will come and drop the form off and will be shown how to use. Medication will be sent into the pharmacy.

## 2019-01-15 ENCOUNTER — Encounter: Payer: Self-pay | Admitting: Physician Assistant

## 2019-01-15 ENCOUNTER — Ambulatory Visit (INDEPENDENT_AMBULATORY_CARE_PROVIDER_SITE_OTHER): Payer: Medicare Other | Admitting: Physician Assistant

## 2019-01-15 ENCOUNTER — Other Ambulatory Visit: Payer: Self-pay

## 2019-01-15 VITALS — BP 97/56 | HR 83 | Temp 97.4°F | Resp 16 | Wt 115.0 lb

## 2019-01-15 DIAGNOSIS — L03115 Cellulitis of right lower limb: Secondary | ICD-10-CM

## 2019-01-15 DIAGNOSIS — J479 Bronchiectasis, uncomplicated: Secondary | ICD-10-CM | POA: Diagnosis not present

## 2019-01-15 MED ORDER — CLINDAMYCIN HCL 300 MG PO CAPS: 300.0000 mg | ORAL_CAPSULE | Freq: Four times a day (QID) | ORAL | 0 refills | Status: DC

## 2019-01-15 NOTE — Progress Notes (Signed)
Patient: Joyce [name] Female    DOB: 1928-02-16   85 y.o.   MRN: [id number] Visit Date: 01/15/2019  Today's Provider: Trinna Post, PA-C   Chief Complaint  Patient presents with  . Leg infection   Subjective:     HPI   Patient is a chronically ill woman with history of COPD recently hospitalized for pneumonia, HTN, history of thyroid cancer here to follow up on leg infection. Patient was hospitalized for sepsis 2/2 PNA on 12/24/2018 and had some redness of her right lower leg at that point. She reports this improved slightly when she was given IV antibiotics which included vancomycin and zosyn. Patient reports she returned home and was seen by visiting nurse and a picture of right leg was sent to PCP Dr.Gilbert and an antibiotic Amoxil 875 mg was sent for her two days ago. Patient was advised to come in if not better. Patient leg is here with her daughter today complaining of wrosening swollen red, lower right leg. She denies fevers, chills. She reports normal appetite. Reports she had a negative [place] for DVT during most recent hospitalization.    Allergies  Allergen Reactions  . No Known Allergies     verified     Current Outpatient Medications:  .  acetaminophen (TYLENOL) 325 MG tablet, Take 650 mg by mouth every 6 (six) hours as needed., Disp: , Rfl:  .  acidophilus (RISAQUAD) CAPS capsule, Take 1 capsule by mouth daily., Disp: , Rfl:  .  albuterol (PROVENTIL HFA;VENTOLIN HFA) 108 (90 Base) MCG/ACT inhaler, Inhale 1-2 puffs into the lungs every 4 (four) hours as needed for wheezing or shortness of breath., Disp: 1 Inhaler, Rfl: 0 .  alendronate (FOSAMAX) 70 MG tablet, Take 1 tablet (70 mg total) by mouth once a week. Take with a full glass of water on an empty stomach., Disp: 4 tablet, Rfl: 12 .  amoxicillin (AMOXIL) 875 MG tablet, Take 1 tablet (875 mg total) by mouth 2 (two) times daily for 7 days., Disp: 14 tablet, Rfl: 0 .  atorvastatin (LIPITOR) 10 MG tablet, Take 1  tablet (10 mg total) by mouth daily., Disp: 90 tablet, Rfl: 3 .  calcium carbonate (TUMS - DOSED IN MG ELEMENTAL CALCIUM) 500 MG chewable tablet, Chew 2 tablets by mouth daily. , Disp: , Rfl:  .  cholecalciferol (VITAMIN D) 1000 units tablet, Take 2,000 Units by mouth daily., Disp: , Rfl:  .  diltiazem (DILACOR XR) 120 MG 24 hr capsule, Take 2 capsules by mouth every morning, and 1 capsule every evening., Disp: 270 capsule, Rfl: 3 .  enalapril (VASOTEC) 20 MG tablet, Take 1 tablet (20 mg total) by mouth daily., Disp: 30 tablet, Rfl: 12 .  feeding supplement, ENSURE ENLIVE, (ENSURE ENLIVE) LIQD, Take 237 mLs by mouth 2 (two) times daily between meals., Disp: 237 mL, Rfl: 12 .  fesoterodine (TOVIAZ) 8 MG TB24 tablet, Take 1 tablet (8 mg total) by mouth daily., Disp: 90 tablet, Rfl: 3 .  Fluticasone-Salmeterol (ADVAIR) 500-50 MCG/DOSE AEPB, Inhale 1 puff into the lungs daily., Disp: , Rfl:  .  levothyroxine (SYNTHROID, LEVOTHROID) 150 MCG tablet, Take 1 tablet (150 mcg total) by mouth daily before breakfast., Disp: 30 tablet, Rfl: 12 .  LORazepam (ATIVAN) 0.5 MG tablet, Take 0.5 tablets (0.25 mg total) by mouth 3 (three) times daily as needed for anxiety., Disp: 90 tablet, Rfl: 3 .  mirtazapine (REMERON) 15 MG tablet, Take 1 tablet (15 mg total)  by mouth at bedtime., Disp: 30 tablet, Rfl: 12 .  Multiple Vitamin (MULTIVITAMIN WITH MINERALS) TABS tablet, Take 1 tablet by mouth daily., Disp: , Rfl:  .  amoxicillin-clavulanate (AUGMENTIN) 875-125 MG tablet, Take 1 tablet by mouth every 12 (twelve) hours. (Patient not taking: Reported on 01/15/2019), Disp: 10 tablet, Rfl: 0 .  benzonatate (TESSALON) 200 MG capsule, Take 1 capsule (200 mg total) by mouth 3 (three) times daily as needed for cough. (Patient not taking: Reported on 01/15/2019), Disp: 20 capsule, Rfl: 0 .  Fluticasone-Salmeterol (ADVAIR DISKUS) 250-50 MCG/DOSE AEPB, Inhale 1 puff into the lungs 2 (two) times daily. (Patient not taking: Reported on  01/15/2019), Disp: 1 each, Rfl: 0 .  predniSONE (STERAPRED UNI-PAK 21 TAB) 10 MG (21) TBPK tablet, Take 1 tablet (10 mg total) by mouth daily. Take 6 tablets by mouth for 1 day followed by  5 tablets by mouth for 1 day followed by  4 tablets by mouth for 1 day followed by  3 tablets by mouth for 1 day followed by  2 tablets by mouth for 1 day followed by  1 tablet by mouth for a day and stop (Patient not taking: Reported on 01/15/2019), Disp: 21 tablet, Rfl: 0  Review of Systems  Social History   Tobacco Use  . Smoking status: Never Smoker  . Smokeless tobacco: Never Used  Substance Use Topics  . Alcohol use: Yes    Alcohol/week: 1.0 standard drinks    Types: 1 Cans of beer per week    Comment: occasional beer, once a year      Objective:   BP (!) 97/56 (BP Location: Left Arm, Patient Position: Sitting, Cuff Size: Large)   Pulse 83   Temp (!) 97.4 F (36.3 C) (Oral)   Resp 16   Wt 115 lb (52.2 kg)   BMI 22.46 kg/m  Vitals:   01/15/19 1229  BP: (!) 97/56  Pulse: 83  Resp: 16  Temp: (!) 97.4 F (36.3 C)  TempSrc: Oral  Weight: 115 lb (52.2 kg)     Physical Exam Constitutional:      Appearance: Normal appearance. She is not ill-appearing.  Cardiovascular:     Rate and Rhythm: Normal rate.  Pulmonary:     Effort: Pulmonary effort is normal.  Musculoskeletal:        General: No tenderness.     Right lower leg: Edema present.     Left lower leg: No edema.  Skin:    General: Skin is warm.     Findings: Erythema present.     Comments: Very erythematous right lower extremity. It is warm to touch over the area of erythema. No visible abscess, no tenderness to palpation in deep venous system. Cellulitis appears worse when compared to picture daughter shows in office.   Neurological:     Mental Status: She is alert. Mental status is at baseline.  Psychiatric:        Mood and Affect: Mood normal.        Behavior: Behavior normal.     Media Information   Document  Information   Photos  Right leg cellulitis  01/15/2019 12:48  Attached To:  Office Visit on 01/15/19 with Trinna Post, PA-C  Source Information   Paulene Floor  Filer    1. Cellulitis of right lower extremity  Cellulitis not improving on amoxicillin and so will change to clindamycin. This is  a chronically ill elderly woman with multiple comorbidities and recent hospitalization 2/2 pneumonia. Have explained to her and her daughter that this is the strongest PO antibiotic we have and if her cellulitis does not improve in one day she will need to go to the ER for admission for IV antibiotics.   - clindamycin (CLEOCIN) 300 MG capsule; Take 1 capsule (300 mg total) by mouth 4 (four) times daily for 7 days.  Dispense: 28 capsule; Refill: 0  2. COPD  3. Essential HTN   The entirety of the information documented in the History of Present Illness, Review of Systems and Physical Exam were personally obtained by me. Portions of this information were initially documented by Lyndel Pleasure, CMA and reviewed by me for thoroughness and accuracy.   F/u PRN     Trinna Post, PA-C  [facility]

## 2019-01-15 NOTE — Patient Instructions (Signed)

## 2019-01-17 ENCOUNTER — Inpatient Hospital Stay
Admission: EM | Admit: 2019-01-17 | Discharge: 2019-01-19 | DRG: 603 | Disposition: A | Discharge status: Home Health | Payer: Medicare Other | Attending: Internal Medicine | Admitting: Internal Medicine

## 2019-01-17 ENCOUNTER — Emergency Department: Payer: Medicare Other

## 2019-01-17 ENCOUNTER — Other Ambulatory Visit: Payer: Self-pay

## 2019-01-17 ENCOUNTER — Encounter: Payer: Self-pay | Admitting: Emergency Medicine

## 2019-01-17 DIAGNOSIS — L0390 Cellulitis, unspecified: Secondary | ICD-10-CM | POA: Diagnosis not present

## 2019-01-17 DIAGNOSIS — Z7951 Long term (current) use of inhaled steroids: Secondary | ICD-10-CM

## 2019-01-17 DIAGNOSIS — Z85828 Personal history of other malignant neoplasm of skin: Secondary | ICD-10-CM | POA: Diagnosis not present

## 2019-01-17 DIAGNOSIS — E890 Postprocedural hypothyroidism: Secondary | ICD-10-CM | POA: Diagnosis present

## 2019-01-17 DIAGNOSIS — Z79890 Hormone replacement therapy (postmenopausal): Secondary | ICD-10-CM

## 2019-01-17 DIAGNOSIS — L03115 Cellulitis of right lower limb: Secondary | ICD-10-CM | POA: Diagnosis not present

## 2019-01-17 DIAGNOSIS — Z853 Personal history of malignant neoplasm of breast: Secondary | ICD-10-CM

## 2019-01-17 DIAGNOSIS — I959 Hypotension, unspecified: Secondary | ICD-10-CM | POA: Diagnosis not present

## 2019-01-17 DIAGNOSIS — Z9089 Acquired absence of other organs: Secondary | ICD-10-CM | POA: Diagnosis not present

## 2019-01-17 DIAGNOSIS — Z823 Family history of stroke: Secondary | ICD-10-CM

## 2019-01-17 DIAGNOSIS — I77819 Aortic ectasia, unspecified site: Secondary | ICD-10-CM | POA: Diagnosis present

## 2019-01-17 DIAGNOSIS — J449 Chronic obstructive pulmonary disease, unspecified: Secondary | ICD-10-CM | POA: Diagnosis present

## 2019-01-17 DIAGNOSIS — K219 Gastro-esophageal reflux disease without esophagitis: Secondary | ICD-10-CM | POA: Diagnosis present

## 2019-01-17 DIAGNOSIS — L8990 Pressure ulcer of unspecified site, unspecified stage: Secondary | ICD-10-CM

## 2019-01-17 DIAGNOSIS — F329 Major depressive disorder, single episode, unspecified: Secondary | ICD-10-CM | POA: Diagnosis not present

## 2019-01-17 DIAGNOSIS — Z8711 Personal history of peptic ulcer disease: Secondary | ICD-10-CM

## 2019-01-17 DIAGNOSIS — Z7983 Long term (current) use of bisphosphonates: Secondary | ICD-10-CM

## 2019-01-17 DIAGNOSIS — F419 Anxiety disorder, unspecified: Secondary | ICD-10-CM | POA: Diagnosis present

## 2019-01-17 DIAGNOSIS — Z79899 Other long term (current) drug therapy: Secondary | ICD-10-CM

## 2019-01-17 DIAGNOSIS — Z8249 Family history of ischemic heart disease and other diseases of the circulatory system: Secondary | ICD-10-CM

## 2019-01-17 DIAGNOSIS — I10 Essential (primary) hypertension: Secondary | ICD-10-CM | POA: Diagnosis present

## 2019-01-17 DIAGNOSIS — M810 Age-related osteoporosis without current pathological fracture: Secondary | ICD-10-CM | POA: Diagnosis present

## 2019-01-17 DIAGNOSIS — Z800 Family history of malignant neoplasm of digestive organs: Secondary | ICD-10-CM | POA: Diagnosis not present

## 2019-01-17 DIAGNOSIS — E785 Hyperlipidemia, unspecified: Secondary | ICD-10-CM | POA: Diagnosis present

## 2019-01-17 DIAGNOSIS — Z85850 Personal history of malignant neoplasm of thyroid: Secondary | ICD-10-CM | POA: Diagnosis not present

## 2019-01-17 LAB — COMPREHENSIVE METABOLIC PANEL
ALT: 24 U/L (ref 0–44)
AST: 25 U/L (ref 15–41)
Albumin: 3.3 g/dL — ABNORMAL LOW (ref 3.5–5.0)
Alkaline Phosphatase: 79 U/L (ref 38–126)
Anion gap: 8 (ref 5–15)
BILIRUBIN TOTAL: 0.3 mg/dL (ref 0.3–1.2)
BUN: 18 mg/dL (ref 8–23)
CO2: 30 mmol/L (ref 22–32)
Calcium: 8.5 mg/dL — ABNORMAL LOW (ref 8.9–10.3)
Chloride: 95 mmol/L — ABNORMAL LOW (ref 98–111)
Creatinine, Ser: 0.56 mg/dL (ref 0.44–1.00)
GFR calc Af Amer: >60 mL/min (ref >60)
GFR calc non Af Amer: >60 mL/min (ref >60)
Glucose, Bld: 117 mg/dL — ABNORMAL HIGH (ref 70–99)
Potassium: 3.6 mmol/L (ref 3.5–5.1)
Sodium: 133 mmol/L — ABNORMAL LOW (ref 135–145)
Total Protein: 6.4 g/dL — ABNORMAL LOW (ref 6.5–8.1)

## 2019-01-17 LAB — CBC WITH DIFFERENTIAL/PLATELET
Abs Immature Granulocytes: 0.03 10*3/uL (ref 0.00–0.07)
Basophils Absolute: 0 10*3/uL (ref 0.0–0.1)
Basophils Relative: 1 %
Eosinophils Absolute: 0.1 10*3/uL (ref 0.0–0.5)
Eosinophils Relative: 1 %
HEMATOCRIT: 35.1 % — AB (ref 36.0–46.0)
Hemoglobin: 11.4 g/dL — ABNORMAL LOW (ref 12.0–15.0)
Immature Granulocytes: 0 %
Lymphocytes Relative: 25 %
Lymphs Abs: 1.9 10*3/uL (ref 0.7–4.0)
MCH: 29.9 pg (ref 26.0–34.0)
MCHC: 32.5 g/dL (ref 30.0–36.0)
MCV: 92.1 fL (ref 80.0–100.0)
Monocytes Absolute: 0.6 10*3/uL (ref 0.1–1.0)
Monocytes Relative: 8 %
Neutro Abs: 4.9 10*3/uL (ref 1.7–7.7)
Neutrophils Relative %: 65 %
Platelets: 260 10*3/uL (ref 150–400)
RBC: 3.81 MIL/uL — ABNORMAL LOW (ref 3.87–5.11)
RDW: 14.7 % (ref 11.5–15.5)
WBC: 7.5 10*3/uL (ref 4.0–10.5)
nRBC: 0 % (ref 0.0–0.2)

## 2019-01-17 MED ORDER — ADULT MULTIVITAMIN W/MINERALS CH
1.0000 | ORAL_TABLET | Freq: Every day | ORAL | Status: DC
  Administered 2019-01-18 – 2019-01-19 (×2): 1 via ORAL
  Filled 2019-01-17 (×2): qty 1

## 2019-01-17 MED ORDER — RISAQUAD PO CAPS
1.0000 | ORAL_CAPSULE | Freq: Every day | ORAL | Status: DC
  Administered 2019-01-18 – 2019-01-19 (×2): 1 via ORAL
  Filled 2019-01-17 (×3): qty 1

## 2019-01-17 MED ORDER — DILTIAZEM HCL ER 120 MG PO CP24
120.0000 mg | ORAL_CAPSULE | Freq: Every day | ORAL | Status: DC
  Filled 2019-01-17: qty 1

## 2019-01-17 MED ORDER — VITAMIN D 25 MCG (1000 UNIT) PO TABS
2000.0000 [IU] | ORAL_TABLET | Freq: Every day | ORAL | Status: DC
  Administered 2019-01-18 – 2019-01-19 (×2): 2000 [IU] via ORAL
  Filled 2019-01-17 (×2): qty 2

## 2019-01-17 MED ORDER — ONDANSETRON HCL 4 MG PO TABS: 4.0000 mg | ORAL_TABLET | Freq: Four times a day (QID) | ORAL | Status: DC | PRN

## 2019-01-17 MED ORDER — FESOTERODINE FUMARATE ER 8 MG PO TB24
8.0000 mg | ORAL_TABLET | Freq: Every day | ORAL | Status: DC
  Administered 2019-01-18 – 2019-01-19 (×2): 8 mg via ORAL
  Filled 2019-01-17 (×3): qty 1

## 2019-01-17 MED ORDER — LORAZEPAM 0.5 MG PO TABS: 0.2500 mg | ORAL_TABLET | Freq: Three times a day (TID) | ORAL | Status: DC | PRN

## 2019-01-17 MED ORDER — ONDANSETRON HCL 4 MG/2ML IJ SOLN: 4.0000 mg | Freq: Four times a day (QID) | INTRAMUSCULAR | Status: DC | PRN

## 2019-01-17 MED ORDER — ACETAMINOPHEN 650 MG RE SUPP: 650.0000 mg | Freq: Four times a day (QID) | RECTAL | Status: DC | PRN

## 2019-01-17 MED ORDER — VANCOMYCIN HCL IN DEXTROSE 1-5 GM/200ML-% IV SOLN
1000.0000 mg | Freq: Once | INTRAVENOUS | Status: AC
  Administered 2019-01-17: 1000 mg via INTRAVENOUS
  Filled 2019-01-17: qty 200

## 2019-01-17 MED ORDER — ALENDRONATE SODIUM 70 MG PO TABS: 70.0000 mg | ORAL_TABLET | ORAL | Status: DC

## 2019-01-17 MED ORDER — ATORVASTATIN CALCIUM 10 MG PO TABS
10.0000 mg | ORAL_TABLET | Freq: Every day | ORAL | Status: DC
  Administered 2019-01-17 – 2019-01-19 (×3): 10 mg via ORAL
  Filled 2019-01-17 (×3): qty 1

## 2019-01-17 MED ORDER — ENSURE ENLIVE PO LIQD
237.0000 mL | Freq: Two times a day (BID) | ORAL | Status: DC
  Administered 2019-01-17 – 2019-01-18 (×3): 237 mL via ORAL

## 2019-01-17 MED ORDER — CEFAZOLIN SODIUM-DEXTROSE 1-4 GM/50ML-% IV SOLN
1.0000 g | Freq: Three times a day (TID) | INTRAVENOUS | Status: DC
  Administered 2019-01-17 – 2019-01-19 (×6): 1 g via INTRAVENOUS
  Filled 2019-01-17 (×8): qty 50

## 2019-01-17 MED ORDER — ENALAPRIL MALEATE 20 MG PO TABS
20.0000 mg | ORAL_TABLET | Freq: Every day | ORAL | Status: DC
  Administered 2019-01-18 – 2019-01-19 (×2): 20 mg via ORAL
  Filled 2019-01-17 (×2): qty 1

## 2019-01-17 MED ORDER — MOMETASONE FURO-FORMOTEROL FUM 200-5 MCG/ACT IN AERO
2.0000 | INHALATION_SPRAY | Freq: Two times a day (BID) | RESPIRATORY_TRACT | Status: DC
  Administered 2019-01-17 – 2019-01-19 (×4): 2 via RESPIRATORY_TRACT
  Filled 2019-01-17: qty 8.8

## 2019-01-17 MED ORDER — ENOXAPARIN SODIUM 40 MG/0.4ML ~~LOC~~ SOLN
40.0000 mg | SUBCUTANEOUS | Status: DC
  Administered 2019-01-17 – 2019-01-19 (×3): 40 mg via SUBCUTANEOUS
  Filled 2019-01-17 (×3): qty 0.4

## 2019-01-17 MED ORDER — LEVOTHYROXINE SODIUM 50 MCG PO TABS
150.0000 ug | ORAL_TABLET | Freq: Every day | ORAL | Status: DC
  Administered 2019-01-18 – 2019-01-19 (×2): 150 ug via ORAL
  Filled 2019-01-17 (×2): qty 1

## 2019-01-17 MED ORDER — VANCOMYCIN HCL 10 G IV SOLR
1250.0000 mg | INTRAVENOUS | Status: DC
  Filled 2019-01-17: qty 1250

## 2019-01-17 MED ORDER — ACETAMINOPHEN 325 MG PO TABS
650.0000 mg | ORAL_TABLET | Freq: Four times a day (QID) | ORAL | Status: DC | PRN
  Administered 2019-01-18: 650 mg via ORAL
  Filled 2019-01-17: qty 2

## 2019-01-17 MED ORDER — ALBUTEROL SULFATE (2.5 MG/3ML) 0.083% IN NEBU: 2.5000 mg | INHALATION_SOLUTION | RESPIRATORY_TRACT | Status: DC | PRN

## 2019-01-17 MED ORDER — MOMETASONE FURO-FORMOTEROL FUM 200-5 MCG/ACT IN AERO: 2.0000 | INHALATION_SPRAY | Freq: Two times a day (BID) | RESPIRATORY_TRACT | Status: DC

## 2019-01-17 MED ORDER — MIRTAZAPINE 15 MG PO TABS
15.0000 mg | ORAL_TABLET | Freq: Every day | ORAL | Status: DC
  Administered 2019-01-17 – 2019-01-18 (×2): 15 mg via ORAL
  Filled 2019-01-17 (×2): qty 1

## 2019-01-17 MED ORDER — BISACODYL 5 MG PO TBEC: 5.0000 mg | DELAYED_RELEASE_TABLET | Freq: Every day | ORAL | Status: DC | PRN

## 2019-01-17 NOTE — ED Notes (Signed)
ED TO INPATIENT HANDOFF REPORT  ED Nurse Name and Phone #: Janett Billow [phone]  S Name/Age/Gender Joyce [name] 85 y.o. female Room/Bed: ED17A/ED17A  Code Status   Code Status: Full Code  Home/SNF/Other Home Patient oriented to: self, place, time and situation Is this baseline? Yes   Triage Complete: Triage complete  Chief Complaint lower r leg pain  Triage Note Pt given amoxicillin Wednesday for cellulitis of RLE.  Pt not improving so went back to doctor Friday and was switched to clindamycin. RLE still getting worse so pt presents to ED. significant swelling to RLE and redness. No fever.   Allergies Allergies  Allergen Reactions  . No Known Allergies     verified    Level of Care/Admitting Diagnosis ED Disposition    ED Disposition Condition [facility] Area: [place] [100120]  Level of Care: Med-Surg [16]  Diagnosis: Cellulitis of right leg [027741]  Admitting Physician: Epifanio Lesches [[id number]]  Attending Physician: Epifanio Lesches [phone]  PT Class (Do Not Modify): Observation [104]  PT Acc Code (Do Not Modify): Observation [10022]       B Medical/Surgery History Past Medical History:  Diagnosis Date  . Anxiety   . Arthritis   . Asthma   . Asthma   . Breast cancer ([place])   . Cancer ([place])   . COPD (chronic obstructive pulmonary disease) ([place])   . Enlarged aorta ([place])   . GERD (gastroesophageal reflux disease)   . High cholesterol   . Hx of skin cancer, basal cell   . Hyperlipemia   . Hypertension   . PUD (peptic ulcer disease)   . Thyroid cancer [facility])    Past Surgical History:  Procedure Laterality Date  . APPENDECTOMY    . BREAST SURGERY    . CHOLECYSTECTOMY    . FRACTURE SURGERY    . JOINT REPLACEMENT    . MASTECTOMY    . REPAIR OF PERFORATED ULCER    . REPLACEMENT TOTAL KNEE BILATERAL    . STOMACH SURGERY    . THYROIDECTOMY       A IV Location/Drains/Wounds Patient Lines/Drains/Airways  Status   Active Line/Drains/Airways    Name:   Placement date:   Placement time:   Site:   Days:   Peripheral IV 01/17/19 Right;Distal Antecubital   01/17/19    0958    Antecubital   less than 1          Intake/Output Last 24 hours  Intake/Output Summary (Last 24 hours) at 01/17/2019 1142 Last data filed at 01/17/2019 1012 Gross per 24 hour  Intake 34.82 ml  Output -  Net 34.82 ml    Labs/Imaging Results for orders placed or performed during the hospital encounter of 01/17/19 (from the past 48 hour(s))  CBC with Differential/Platelet     Status: Abnormal   Collection Time: 01/17/19  9:47 AM  Result Value Ref Range   WBC 7.5 4.0 - 10.5 K/uL   RBC 3.81 (L) 3.87 - 5.11 MIL/uL   Hemoglobin 11.4 (L) 12.0 - 15.0 g/dL   HCT 35.1 (L) 36.0 - 46.0 %   MCV 92.1 80.0 - 100.0 fL   MCH 29.9 26.0 - 34.0 pg   MCHC 32.5 30.0 - 36.0 g/dL   RDW 14.7 11.5 - 15.5 %   Platelets 260 150 - 400 K/uL   nRBC 0.0 0.0 - 0.2 %   Neutrophils Relative % 65 %   Neutro Abs 4.9 1.7 - 7.7  K/uL   Lymphocytes Relative 25 %   Lymphs Abs 1.9 0.7 - 4.0 K/uL   Monocytes Relative 8 %   Monocytes Absolute 0.6 0.1 - 1.0 K/uL   Eosinophils Relative 1 %   Eosinophils Absolute 0.1 0.0 - 0.5 K/uL   Basophils Relative 1 %   Basophils Absolute 0.0 0.0 - 0.1 K/uL   Immature Granulocytes 0 %   Abs Immature Granulocytes 0.03 0.00 - 0.07 K/uL    Comment: Performed at [facility], [address] [place], [place] [address]  Comprehensive metabolic panel     Status: Abnormal   Collection Time: 01/17/19  9:47 AM  Result Value Ref Range   Sodium 133 (L) 135 - 145 mmol/L   Potassium 3.6 3.5 - 5.1 mmol/L   Chloride 95 (L) 98 - 111 mmol/L   CO2 30 22 - 32 mmol/L   Glucose, Bld 117 (H) 70 - 99 mg/dL   BUN 18 8 - 23 mg/dL   Creatinine, Ser 0.56 0.44 - 1.00 mg/dL   Calcium 8.5 (L) 8.9 - 10.3 mg/dL   Total Protein 6.4 (L) 6.5 - 8.1 g/dL   Albumin 3.3 (L) 3.5 - 5.0 g/dL   AST 25 15 - 41 U/L   ALT 24 0 - 44 U/L    Alkaline Phosphatase 79 38 - 126 U/L   Total Bilirubin 0.3 0.3 - 1.2 mg/dL   GFR calc non Af Amer >60 >60 mL/min   GFR calc Af Amer >60 >60 mL/min   Anion gap 8 5 - 15    Comment: Performed at [facility], [address] [place],  [address]   US Venous Img Lower Unilateral Right  Result Date: 01/17/2019 CLINICAL DATA:  Cellulitis, leg edema, possible DVT EXAM: Right LOWER EXTREMITY VENOUS DOPPLER ULTRASOUND TECHNIQUE: Gray-scale sonography with graded compression, as well as color Doppler and duplex ultrasound were performed to evaluate the lower extremity deep venous systems from the level of the common femoral vein and including the common femoral, femoral, profunda femoral, popliteal and calf veins including the posterior tibial, peroneal and gastrocnemius veins when visible. The superficial great saphenous vein was also interrogated. Spectral Doppler was utilized to evaluate flow at rest and with distal augmentation maneuvers in the common femoral, femoral and popliteal veins. COMPARISON:  None. FINDINGS: Contralateral Common Femoral Vein: Respiratory phasicity is normal and symmetric with the symptomatic side. No evidence of thrombus. Normal compressibility. Common Femoral Vein: No evidence of thrombus. Normal compressibility, respiratory phasicity and response to augmentation. Saphenofemoral Junction: No evidence of thrombus. Normal compressibility and flow on color Doppler imaging. Profunda Femoral Vein: No evidence of thrombus. Normal compressibility and flow on color Doppler imaging. Femoral Vein: No evidence of thrombus. Normal compressibility, respiratory phasicity and response to augmentation. Popliteal Vein: No evidence of thrombus. Normal compressibility, respiratory phasicity and response to augmentation. Calf Veins: No evidence of thrombus. Normal compressibility and flow on color Doppler imaging. Superficial Great Saphenous Vein: No evidence of thrombus. Normal  compressibility. Venous Reflux:  None. Other Findings:  Subcutaneous edema in the calf. IMPRESSION: No evidence of deep venous thrombosis in the visualized right lower extremity. Electronically Signed   By: Julian Hy M.D.   On: 01/17/2019 10:59    Pending Labs Unresulted Labs (From admission, onward)    Start     Ordered   01/24/19 0500  Creatinine, serum  (enoxaparin (LOVENOX)    CrCl >/= 30 ml/min)  Weekly,   STAT    Comments:  while on enoxaparin therapy    01/17/19 1121   01/18/19 3818  Basic metabolic panel  Tomorrow morning,   STAT     01/17/19 1121   01/18/19 0500  CBC  Tomorrow morning,   STAT     01/17/19 1121   01/17/19 1120  CBC  (enoxaparin (LOVENOX)    CrCl >/= 30 ml/min)  Once,   STAT    Comments:  Baseline for enoxaparin therapy IF NOT ALREADY DRAWN.  Notify MD if PLT < 100 K.    01/17/19 1121   01/17/19 1120  Creatinine, serum  (enoxaparin (LOVENOX)    CrCl >/= 30 ml/min)  Once,   STAT    Comments:  Baseline for enoxaparin therapy IF NOT ALREADY DRAWN.    01/17/19 1121   01/17/19 0946  Blood culture (routine x 2)  BLOOD CULTURE X 2,   STAT     01/17/19 0945          Vitals/Pain Today's Vitals   01/17/19 0931 01/17/19 0933 01/17/19 1114 01/17/19 1115  BP:  (!) 104/50  (!) 95/52  Pulse:  87 70 70  Resp:  18  18  Temp:  (!) 97.5 F (36.4 C)    TempSrc:  Oral    SpO2:  97% 99% 99%  Weight: 52.2 kg     Height: 5' (1.524 m)     PainSc: 0-No pain       Isolation Precautions No active isolations  Medications Medications  atorvastatin (LIPITOR) tablet 10 mg (has no administration in time range)  diltiazem (DILACOR XR) 24 hr capsule 120 mg (has no administration in time range)  enalapril (VASOTEC) tablet 20 mg (has no administration in time range)  levothyroxine (SYNTHROID, LEVOTHROID) tablet 150 mcg (has no administration in time range)  alendronate (FOSAMAX) tablet 70 mg (has no administration in time range)  mirtazapine (REMERON) tablet 15 mg  (has no administration in time range)  LORazepam (ATIVAN) tablet 0.25 mg (has no administration in time range)  fesoterodine (TOVIAZ) tablet 8 mg (has no administration in time range)  acidophilus (RISAQUAD) capsule 1 capsule (has no administration in time range)  mometasone-formoterol (DULERA) 200-5 MCG/ACT inhaler 2 puff (has no administration in time range)  mometasone-formoterol (DULERA) 200-5 MCG/ACT inhaler 2 puff (has no administration in time range)  albuterol (PROVENTIL HFA;VENTOLIN HFA) 108 (90 Base) MCG/ACT inhaler 1-2 puff (has no administration in time range)  multivitamin with minerals tablet 1 tablet (has no administration in time range)  feeding supplement (ENSURE ENLIVE) (ENSURE ENLIVE) liquid 237 mL (has no administration in time range)  cholecalciferol (VITAMIN D) tablet 2,000 Units (has no administration in time range)  acetaminophen (TYLENOL) tablet 650 mg (has no administration in time range)    Or  acetaminophen (TYLENOL) suppository 650 mg (has no administration in time range)  bisacodyl (DULCOLAX) EC tablet 5 mg (has no administration in time range)  ondansetron (ZOFRAN) tablet 4 mg (has no administration in time range)    Or  ondansetron (ZOFRAN) injection 4 mg (has no administration in time range)  enoxaparin (LOVENOX) injection 40 mg (has no administration in time range)  ceFAZolin (ANCEF) IVPB 1 g/50 mL premix (has no administration in time range)  vancomycin (VANCOCIN) 1,250 mg in sodium chloride 0.9 % 250 mL IVPB (has no administration in time range)  vancomycin (VANCOCIN) IVPB 1000 mg/200 mL premix ( Intravenous Rate/Dose Verify 01/17/19 1012)    Mobility walks with device Low fall risk   Focused Assessments skin-cellulitis to  right lower leg with swelling and redness   R Recommendations: See Admitting Provider Note  Report given to:   Additional Notes:

## 2019-01-17 NOTE — ED Notes (Signed)
ED TO INPATIENT HANDOFF REPORT  ED Nurse Name and Phone #:  Janett Billow 51  S Name/Age/Gender Joyce [name] 85 y.o. female Room/Bed: ED17A/ED17A  Code Status   Code Status: Full Code  Home/SNF/Other Home Patient oriented to: self Is this baseline? Yes   Triage Complete: Triage complete  Chief Complaint lower r leg pain  Triage Note Pt given amoxicillin Wednesday for cellulitis of RLE.  Pt not improving so went back to doctor Friday and was switched to clindamycin. RLE still getting worse so pt presents to ED. significant swelling to RLE and redness. No fever.   Allergies Allergies  Allergen Reactions  . No Known Allergies     verified    Level of Care/Admitting Diagnosis ED Disposition    ED Disposition Condition [facility] Area: [place] [100120]  Level of Care: Med-Surg [16]  Diagnosis: Cellulitis of right leg [778242]  Admitting Physician: Epifanio Lesches [[id number]]  Attending Physician: Epifanio Lesches [phone]  PT Class (Do Not Modify): Observation [104]  PT Acc Code (Do Not Modify): Observation [10022]       B Medical/Surgery History Past Medical History:  Diagnosis Date  . Anxiety   . Arthritis   . Asthma   . Asthma   . Breast cancer ([place])   . Cancer ([place])   . COPD (chronic obstructive pulmonary disease) ([place])   . Enlarged aorta ([place])   . GERD (gastroesophageal reflux disease)   . High cholesterol   . Hx of skin cancer, basal cell   . Hyperlipemia   . Hypertension   . PUD (peptic ulcer disease)   . Thyroid cancer [facility])    Past Surgical History:  Procedure Laterality Date  . APPENDECTOMY    . BREAST SURGERY    . CHOLECYSTECTOMY    . FRACTURE SURGERY    . JOINT REPLACEMENT    . MASTECTOMY    . REPAIR OF PERFORATED ULCER    . REPLACEMENT TOTAL KNEE BILATERAL    . STOMACH SURGERY    . THYROIDECTOMY       A IV Location/Drains/Wounds Patient Lines/Drains/Airways Status   Active  Line/Drains/Airways    Name:   Placement date:   Placement time:   Site:   Days:   Peripheral IV 01/17/19 Right;Distal Antecubital   01/17/19    0958    Antecubital   less than 1          Intake/Output Last 24 hours  Intake/Output Summary (Last 24 hours) at 01/17/2019 1142 Last data filed at 01/17/2019 1012 Gross per 24 hour  Intake 34.82 ml  Output -  Net 34.82 ml    Labs/Imaging Results for orders placed or performed during the hospital encounter of 01/17/19 (from the past 48 hour(s))  CBC with Differential/Platelet     Status: Abnormal   Collection Time: 01/17/19  9:47 AM  Result Value Ref Range   WBC 7.5 4.0 - 10.5 K/uL   RBC 3.81 (L) 3.87 - 5.11 MIL/uL   Hemoglobin 11.4 (L) 12.0 - 15.0 g/dL   HCT 35.1 (L) 36.0 - 46.0 %   MCV 92.1 80.0 - 100.0 fL   MCH 29.9 26.0 - 34.0 pg   MCHC 32.5 30.0 - 36.0 g/dL   RDW 14.7 11.5 - 15.5 %   Platelets 260 150 - 400 K/uL   nRBC 0.0 0.0 - 0.2 %   Neutrophils Relative % 65 %   Neutro Abs 4.9 1.7 - 7.7 K/uL  Lymphocytes Relative 25 %   Lymphs Abs 1.9 0.7 - 4.0 K/uL   Monocytes Relative 8 %   Monocytes Absolute 0.6 0.1 - 1.0 K/uL   Eosinophils Relative 1 %   Eosinophils Absolute 0.1 0.0 - 0.5 K/uL   Basophils Relative 1 %   Basophils Absolute 0.0 0.0 - 0.1 K/uL   Immature Granulocytes 0 %   Abs Immature Granulocytes 0.03 0.00 - 0.07 K/uL    Comment: Performed at [facility], [address] [place], [place] [address]  Comprehensive metabolic panel     Status: Abnormal   Collection Time: 01/17/19  9:47 AM  Result Value Ref Range   Sodium 133 (L) 135 - 145 mmol/L   Potassium 3.6 3.5 - 5.1 mmol/L   Chloride 95 (L) 98 - 111 mmol/L   CO2 30 22 - 32 mmol/L   Glucose, Bld 117 (H) 70 - 99 mg/dL   BUN 18 8 - 23 mg/dL   Creatinine, Ser 0.56 0.44 - 1.00 mg/dL   Calcium 8.5 (L) 8.9 - 10.3 mg/dL   Total Protein 6.4 (L) 6.5 - 8.1 g/dL   Albumin 3.3 (L) 3.5 - 5.0 g/dL   AST 25 15 - 41 U/L   ALT 24 0 - 44 U/L   Alkaline  Phosphatase 79 38 - 126 U/L   Total Bilirubin 0.3 0.3 - 1.2 mg/dL   GFR calc non Af Amer >60 >60 mL/min   GFR calc Af Amer >60 >60 mL/min   Anion gap 8 5 - 15    Comment: Performed at [facility], [address] [place], [place] [address]   US Venous Img Lower Unilateral Right  Result Date: 01/17/2019 CLINICAL DATA:  Cellulitis, leg edema, possible DVT EXAM: Right LOWER EXTREMITY VENOUS DOPPLER ULTRASOUND TECHNIQUE: Gray-scale sonography with graded compression, as well as color Doppler and duplex ultrasound were performed to evaluate the lower extremity deep venous systems from the level of the common femoral vein and including the common femoral, femoral, profunda femoral, popliteal and calf veins including the posterior tibial, peroneal and gastrocnemius veins when visible. The superficial great saphenous vein was also interrogated. Spectral Doppler was utilized to evaluate flow at rest and with distal augmentation maneuvers in the common femoral, femoral and popliteal veins. COMPARISON:  None. FINDINGS: Contralateral Common Femoral Vein: Respiratory phasicity is normal and symmetric with the symptomatic side. No evidence of thrombus. Normal compressibility. Common Femoral Vein: No evidence of thrombus. Normal compressibility, respiratory phasicity and response to augmentation. Saphenofemoral Junction: No evidence of thrombus. Normal compressibility and flow on color Doppler imaging. Profunda Femoral Vein: No evidence of thrombus. Normal compressibility and flow on color Doppler imaging. Femoral Vein: No evidence of thrombus. Normal compressibility, respiratory phasicity and response to augmentation. Popliteal Vein: No evidence of thrombus. Normal compressibility, respiratory phasicity and response to augmentation. Calf Veins: No evidence of thrombus. Normal compressibility and flow on color Doppler imaging. Superficial Great Saphenous Vein: No evidence of thrombus. Normal compressibility.  Venous Reflux:  None. Other Findings:  Subcutaneous edema in the calf. IMPRESSION: No evidence of deep venous thrombosis in the visualized right lower extremity. Electronically Signed   By: Julian Hy M.D.   On: 01/17/2019 10:59    Pending Labs Unresulted Labs (From admission, onward)    Start     Ordered   01/24/19 0500  Creatinine, serum  (enoxaparin (LOVENOX)    CrCl >/= 30 ml/min)  Weekly,   STAT    Comments:  while on enoxaparin  therapy    01/17/19 1121   01/18/19 2119  Basic metabolic panel  Tomorrow morning,   STAT     01/17/19 1121   01/18/19 0500  CBC  Tomorrow morning,   STAT     01/17/19 1121   01/17/19 1120  CBC  (enoxaparin (LOVENOX)    CrCl >/= 30 ml/min)  Once,   STAT    Comments:  Baseline for enoxaparin therapy IF NOT ALREADY DRAWN.  Notify MD if PLT < 100 K.    01/17/19 1121   01/17/19 1120  Creatinine, serum  (enoxaparin (LOVENOX)    CrCl >/= 30 ml/min)  Once,   STAT    Comments:  Baseline for enoxaparin therapy IF NOT ALREADY DRAWN.    01/17/19 1121   01/17/19 0946  Blood culture (routine x 2)  BLOOD CULTURE X 2,   STAT     01/17/19 0945          Vitals/Pain Today's Vitals   01/17/19 0931 01/17/19 0933 01/17/19 1114 01/17/19 1115  BP:  (!) 104/50  (!) 95/52  Pulse:  87 70 70  Resp:  18  18  Temp:  (!) 97.5 F (36.4 C)    TempSrc:  Oral    SpO2:  97% 99% 99%  Weight: 52.2 kg     Height: 5' (1.524 m)     PainSc: 0-No pain       Isolation Precautions No active isolations  Medications Medications  atorvastatin (LIPITOR) tablet 10 mg (has no administration in time range)  diltiazem (DILACOR XR) 24 hr capsule 120 mg (has no administration in time range)  enalapril (VASOTEC) tablet 20 mg (has no administration in time range)  levothyroxine (SYNTHROID, LEVOTHROID) tablet 150 mcg (has no administration in time range)  alendronate (FOSAMAX) tablet 70 mg (has no administration in time range)  mirtazapine (REMERON) tablet 15 mg (has no  administration in time range)  LORazepam (ATIVAN) tablet 0.25 mg (has no administration in time range)  fesoterodine (TOVIAZ) tablet 8 mg (has no administration in time range)  acidophilus (RISAQUAD) capsule 1 capsule (has no administration in time range)  mometasone-formoterol (DULERA) 200-5 MCG/ACT inhaler 2 puff (has no administration in time range)  mometasone-formoterol (DULERA) 200-5 MCG/ACT inhaler 2 puff (has no administration in time range)  albuterol (PROVENTIL HFA;VENTOLIN HFA) 108 (90 Base) MCG/ACT inhaler 1-2 puff (has no administration in time range)  multivitamin with minerals tablet 1 tablet (has no administration in time range)  feeding supplement (ENSURE ENLIVE) (ENSURE ENLIVE) liquid 237 mL (has no administration in time range)  cholecalciferol (VITAMIN D) tablet 2,000 Units (has no administration in time range)  acetaminophen (TYLENOL) tablet 650 mg (has no administration in time range)    Or  acetaminophen (TYLENOL) suppository 650 mg (has no administration in time range)  bisacodyl (DULCOLAX) EC tablet 5 mg (has no administration in time range)  ondansetron (ZOFRAN) tablet 4 mg (has no administration in time range)    Or  ondansetron (ZOFRAN) injection 4 mg (has no administration in time range)  enoxaparin (LOVENOX) injection 40 mg (has no administration in time range)  ceFAZolin (ANCEF) IVPB 1 g/50 mL premix (has no administration in time range)  vancomycin (VANCOCIN) 1,250 mg in sodium chloride 0.9 % 250 mL IVPB (has no administration in time range)  vancomycin (VANCOCIN) IVPB 1000 mg/200 mL premix ( Intravenous Rate/Dose Verify 01/17/19 1012)    Mobility walks with person assist Low fall risk   Focused Assessments    R  Recommendations: See Admitting Provider Note  Report given to:   Additional Notes:

## 2019-01-17 NOTE — ED Notes (Signed)
[place] sent to lab as well as both sets of blood cultures ordered.

## 2019-01-17 NOTE — Consult Note (Signed)
Pharmacy Antibiotic Note  Joyce [name] is a 85 y.o. female admitted on 01/17/2019 with cellulitis.  Pharmacy has been consulted for vancomycin dosing. She presented to the ED for cellulitis of the right lower extremity.  She was given amoxicillin on Wednesday for cellulitis but on Friday after she was no better she was switched to clindamycin.  She reports continued swelling and erythema to the right lower extremity below the knee.  Plan: Vancomycin 1250 mg IV Q 48 hrs starting 24 hours after 1000 mg loading dose in the ED Goal AUC 400-550, T1/2: 20.8h, Cp 27.5 mcg/mL Expected AUC: 500.1 SCr used: 0.8 (rounded up from 0.56)  Height: 5' (152.4 cm) Weight: 115 lb (52.2 kg) IBW/kg (Calculated) : 45.5  Temp (24hrs), Avg:97.5 F (36.4 C), Min:97.5 F (36.4 C), Max:97.5 F (36.4 C)  Recent Labs  Lab 01/17/19 0947  WBC 7.5  CREATININE 0.56    Estimated Creatinine Clearance: 34.9 mL/min (by C-G formula based on SCr of 0.56 mg/dL).    Antimicrobials this admission: vancomycin 3/15 >>  cefepime 3/15 >>   Microbiology results: 3/15 BCx: pending  Thank you for allowing pharmacy to be a part of this patient's care.  Dallie Piles, PharmD 01/17/2019 11:32 AM

## 2019-01-17 NOTE — ED Provider Notes (Signed)
[facility] Emergency Department Provider Note       Time seen: ----------------------------------------- 9:45 AM on 01/17/2019 -----------------------------------------   I have reviewed the triage vital signs and the nursing notes.  HISTORY   Chief Complaint Cellulitis    HPI Joyce [name] is a 85 y.o. female with a history of anxiety, asthma, cancer, COPD, GERD, hyperlipidemia, hypertension, peptic ulcer disease who presents to the ED for cellulitis of the right lower extremity.  Patient was given antibiotics on Wednesday for cellulitis by her doctor.  She was taking amoxicillin but on Friday after she was no better she was switched to clindamycin.  She reports continued swelling and erythema to the right lower extremity below the knee.  She has had some cellulitis in this leg before.  Past Medical History:  Diagnosis Date  . Anxiety   . Arthritis   . Asthma   . Asthma   . Breast cancer ([place])   . Cancer ([place])   . COPD (chronic obstructive pulmonary disease) ([place])   . Enlarged aorta ([place])   . GERD (gastroesophageal reflux disease)   . High cholesterol   . Hx of skin cancer, basal cell   . Hyperlipemia   . Hypertension   . PUD (peptic ulcer disease)   . Thyroid cancer [facility])     Patient Active Problem List   Diagnosis Date Noted  . Multifocal pneumonia 12/24/2018  . GERD (gastroesophageal reflux disease) 12/24/2018  . COPD with acute exacerbation ([place]) 12/24/2018  . Acute respiratory failure with hypoxia and hypercapnia ([place]) 12/24/2018  . Other nonrheumatic aortic valve disorders 10/14/2017  . Do not resuscitate status 08/27/2017  . Personal history of malignant neoplasm of breast 08/27/2017  . Chronic venous hypertension (idiopathic) without complications of bilateral lower extremity 07/25/2017  . Tachycardia, unspecified 02/25/2017  . Hyponatremia 11/22/2016  . Osteoporosis 09/24/2016  . 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 Hematuria 07/02/2016  . Mass of hand 06/04/2016  . Other specified soft tissue disorders 06/04/2016  . Ascending aorta dilatation (HCC) 05/15/2016  . Generalized anxiety disorder 10/24/2015  . Postoperative hypothyroidism 12/15/2013  . Papillary carcinoma of thyroid ([place]) 11/26/2013  . Primary hyperparathyroidism ([place]) 04/01/2013  . Osteoarthritis of knee 03/23/2013  . Nephrolithiasis 02/03/2013  . Malignant neoplasm of female breast ([place]) 11/28/2009  . Asthma 02/24/2009  . Bronchiectasis without acute exacerbation ([place]) 02/24/2009  . Arthropathy of hand 06/16/2008  . Hyperlipidemia 11/16/2007  . Essential hypertension 11/16/2007    Past Surgical History:  Procedure Laterality Date  . APPENDECTOMY    . BREAST SURGERY    . CHOLECYSTECTOMY    . FRACTURE SURGERY    . JOINT REPLACEMENT    . MASTECTOMY    . REPAIR OF PERFORATED ULCER    . REPLACEMENT TOTAL KNEE BILATERAL    . STOMACH SURGERY    . THYROIDECTOMY      Allergies No known allergies  Social History Social History   Tobacco Use  . Smoking status: Never Smoker  . Smokeless tobacco: Never Used  Substance Use Topics  . Alcohol use: Yes    Alcohol/week: 1.0 standard drinks    Types: 1 Cans of beer per week    Comment: occasional beer, once a year  . Drug use: No    Review of Systems Constitutional: Negative for fever. Cardiovascular: Negative for chest pain. Respiratory: Negative for shortness of breath. Gastrointestinal: Negative for abdominal pain Musculoskeletal: Positive for right lower extremity pain and swelling Skin:  Positive for right lower extremity erythema Neurological: Negative for headaches, focal weakness or numbness.  All systems negative/normal/unremarkable except as stated in the HPI  ____________________________________________   PHYSICAL EXAM:  VITAL SIGNS: ED Triage Vitals  Enc Vitals Group     BP 01/17/19 0933 (!) 104/50     Pulse Rate 01/17/19 0933 87     Resp  01/17/19 0933 18     Temp 01/17/19 0933 (!) 97.5 F (36.4 C)     Temp Source 01/17/19 0933 Oral     SpO2 01/17/19 0933 97 %     Weight 01/17/19 0931 115 lb (52.2 kg)     Height 01/17/19 0931 5' (1.524 m)     Head Circumference --      Peak Flow --      Pain Score 01/17/19 0931 0     Pain Loc --      Pain Edu? --      Excl. in [place]? --    Constitutional: Alert and oriented.  No distress Cardiovascular: Normal rate, regular rhythm. No murmurs, rubs, or gallops. Respiratory: Normal respiratory effort without tachypnea nor retractions. Breath sounds are clear and equal bilaterally. No wheezes/rales/rhonchi. Gastrointestinal: Soft and nontender. Normal bowel sounds Musculoskeletal: Extensive right lower extremity edema, particularly below the knee.  Right lower extremity is markedly enlarged compared to left.  Extensive erythema is noted from proximal tibia all the way to the ankle.  Edema and erythema appears to be circumferential Neurologic:  Normal speech and language. No gross focal neurologic deficits are appreciated.  Skin: Acute erythema and edema to the right lower extremity below the knee Psychiatric: Mood and affect are normal. Speech and behavior are normal.  ____________________________________________  ED COURSE:  As part of my medical decision making, I reviewed the following data within the [place] History obtained from family if available, nursing notes, old chart and ekg, as well as notes from prior ED visits. Patient presented for cellulitis, we will assess with labs and imaging as indicated at this time.   Procedures ____________________________________________   LABS (pertinent positives/negatives)  Labs Reviewed  CBC WITH DIFFERENTIAL/PLATELET - Abnormal; Notable for the following components:      Result Value   RBC 3.81 (*)    Hemoglobin 11.4 (*)    HCT 35.1 (*)    All other components within normal limits  COMPREHENSIVE METABOLIC PANEL -  Abnormal; Notable for the following components:   Sodium 133 (*)    Chloride 95 (*)    Glucose, Bld 117 (*)    Calcium 8.5 (*)    Total Protein 6.4 (*)    Albumin 3.3 (*)    All other components within normal limits  CULTURE, BLOOD (ROUTINE X 2)  CULTURE, BLOOD (ROUTINE X 2)    RADIOLOGY Images were viewed by me  Right lower extremity ultrasound Was unremarkable ____________________________________________   DIFFERENTIAL DIAGNOSIS   Cellulitis, DVT, sepsis  FINAL ASSESSMENT AND PLAN  Cellulitis   Plan: The patient had presented for cellulitis after failing outpatient treatment for same with amoxicillin and clindamycin. Patient's labs were reassuring. Patient's imaging was negative here for DVT.  Patient was given IV vancomycin here to cover for failed outpatient cellulitis treatment.  I will discuss with the hospitalist for admission.   Laurence Aly, MD    Note: This note was generated in part or whole with voice recognition software. Voice recognition is usually quite accurate but there are transcription errors that can and very  often do occur. I apologize for any typographical errors that were not detected and corrected.     Earleen Newport, MD 01/17/19 1534

## 2019-01-17 NOTE — ED Notes (Signed)
Cellulitis noted to RLE. Very swollen compared to LLE. Foot is not red/swollen. Has been on PO antibiotics with no improvement. Had negative [place] in February. Ambulatory with assistance. Family at bedside.

## 2019-01-17 NOTE — H&P (Signed)
[facility] [place] NAME: Joyce [name]    MR#:  [id number]  DATE OF BIRTH:  August 05, 1928  DATE OF ADMISSION:  01/17/2019  PRIMARY CARE PHYSICIAN: Jerrol Banana., MD   REQUESTING/REFERRING PHYSICIAN: Dr. Lenise Arena  CHIEF COMPLAINT: Right leg cellulitis   Chief Complaint  Patient presents with  . Cellulitis    HISTORY OF PRESENT ILLNESS:  Joyce [name]  is a 85 y.o. female with a known history of anxiety, hypertension, hyperlipidemia, PUD, comes in because of worsening right leg cellulitis.  Has been we are dealing with right leg cellulitis for 2 weeks, patient saw PCP and was given amoxicillin 875 mg p.o. twice daily, prescribed on March 10, because patient did not improve amoxicillin was stopped and changed to clindamycin on Friday, patient came today to hospital because of worsening edema, redness of the right leg.  No fever, no chills.  Ultrasound of right leg did not show DVT here.  Admitting patient observation status for failed antibiotic therapy with worsening right  leg cellulitis.  PAST MEDICAL HISTORY:   Past Medical History:  Diagnosis Date  . Anxiety   . Arthritis   . Asthma   . Asthma   . Breast cancer ([place])   . Cancer ([place])   . COPD (chronic obstructive pulmonary disease) ([place])   . Enlarged aorta ([place])   . GERD (gastroesophageal reflux disease)   . High cholesterol   . Hx of skin cancer, basal cell   . Hyperlipemia   . Hypertension   . PUD (peptic ulcer disease)   . Thyroid cancer ([place])     PAST SURGICAL HISTOIRY:   Past Surgical History:  Procedure Laterality Date  . APPENDECTOMY    . BREAST SURGERY    . CHOLECYSTECTOMY    . FRACTURE SURGERY    . JOINT REPLACEMENT    . MASTECTOMY    . REPAIR OF PERFORATED ULCER    . REPLACEMENT TOTAL KNEE BILATERAL    . STOMACH SURGERY    . THYROIDECTOMY      SOCIAL HISTORY:   Social History   Tobacco Use  . Smoking status: Never Smoker  .  Smokeless tobacco: Never Used  Substance Use Topics  . Alcohol use: Yes    Alcohol/week: 1.0 standard drinks    Types: 1 Cans of beer per week    Comment: occasional beer, once a year    FAMILY HISTORY:   Family History  Problem Relation Age of Onset  . Hypertension Father   . Colon cancer Father   . Stroke Mother     DRUG ALLERGIES:   Allergies  Allergen Reactions  . No Known Allergies     verified    REVIEW OF SYSTEMS:  CONSTITUTIONAL: No fever, fatigue or weakness.  EYES: No blurred or double vision.  EARS, NOSE, AND THROAT: No tinnitus or ear pain.  RESPIRATORY: No cough, shortness of breath, wheezing or hemoptysis.  CARDIOVASCULAR: No chest pain, orthopnea, edema.  GASTROINTESTINAL: No nausea, vomiting, diarrhea or abdominal pain.  GENITOURINARY: No dysuria, hematuria.  ENDOCRINE: No polyuria, nocturia,  HEMATOLOGY: No anemia, easy bruising or bleeding SKIN: No rash or lesion. MUSCULOSKELETAL: Patient noted to have right leg swelling, redness all the way up to right knee, has edema of both legs. NEUROLOGIC: No tingling, numbness, weakness.  PSYCHIATRY: No anxiety or depression.   MEDICATIONS AT HOME:   Prior to Admission medications   Medication Sig Start Date End Date  Taking? Authorizing Provider  acetaminophen (TYLENOL) 325 MG tablet Take 650 mg by mouth every 6 (six) hours as needed.    [provider]  acidophilus (RISAQUAD) CAPS capsule Take 1 capsule by mouth daily.    [provider]  albuterol (PROVENTIL HFA;VENTOLIN HFA) 108 (90 Base) MCG/ACT inhaler Inhale 1-2 puffs into the lungs every 4 (four) hours as needed for wheezing or shortness of breath. 12/27/18   Nicholes Mango, MD  alendronate (FOSAMAX) 70 MG tablet Take 1 tablet (70 mg total) by mouth once a week. Take with a full glass of water on an empty stomach. 08/06/18   Jerrol Banana., MD  atorvastatin (LIPITOR) 10 MG tablet Take 1 tablet (10 mg total) by mouth daily. 08/06/18    Jerrol Banana., MD  benzonatate (TESSALON) 200 MG capsule Take 1 capsule (200 mg total) by mouth 3 (three) times daily as needed for cough. Patient not taking: Reported on 01/15/2019 12/27/18   Nicholes Mango, MD  calcium carbonate (TUMS - DOSED IN MG ELEMENTAL CALCIUM) 500 MG chewable tablet Chew 2 tablets by mouth daily.     [provider]  cholecalciferol (VITAMIN D) 1000 units tablet Take 2,000 Units by mouth daily.    [provider]  clindamycin (CLEOCIN) 300 MG capsule Take 1 capsule (300 mg total) by mouth 4 (four) times daily for 7 days. 01/15/19 01/22/19  Trinna Post, PA-C  diltiazem (DILACOR XR) 120 MG 24 hr capsule Take 2 capsules by mouth every morning, and 1 capsule every evening. 09/16/18   Jerrol Banana., MD  enalapril (VASOTEC) 20 MG tablet Take 1 tablet (20 mg total) by mouth daily. 08/06/18   Jerrol Banana., MD  feeding supplement, ENSURE ENLIVE, (ENSURE ENLIVE) LIQD Take 237 mLs by mouth 2 (two) times daily between meals. 11/24/16   Fritzi Mandes, MD  fesoterodine (TOVIAZ) 8 MG TB24 tablet Take 1 tablet (8 mg total) by mouth daily. 09/01/18   McGowan, Hunt Oris, PA-C  Fluticasone-Salmeterol (ADVAIR DISKUS) 250-50 MCG/DOSE AEPB Inhale 1 puff into the lungs 2 (two) times daily. Patient not taking: Reported on 01/15/2019 12/27/18 12/27/19  Nicholes Mango, MD  Fluticasone-Salmeterol (ADVAIR) 500-50 MCG/DOSE AEPB Inhale 1 puff into the lungs daily.    [provider]  levothyroxine (SYNTHROID, LEVOTHROID) 150 MCG tablet Take 1 tablet (150 mcg total) by mouth daily before breakfast. 08/06/18   Jerrol Banana., MD  LORazepam (ATIVAN) 0.5 MG tablet Take 0.5 tablets (0.25 mg total) by mouth 3 (three) times daily as needed for anxiety. 08/06/18   Jerrol Banana., MD  mirtazapine (REMERON) 15 MG tablet Take 1 tablet (15 mg total) by mouth at bedtime. 12/31/18   Jerrol Banana., MD  Multiple Vitamin (MULTIVITAMIN WITH MINERALS) TABS  tablet Take 1 tablet by mouth daily.    [provider]  predniSONE (STERAPRED UNI-PAK 21 TAB) 10 MG (21) TBPK tablet Take 1 tablet (10 mg total) by mouth daily. Take 6 tablets by mouth for 1 day followed by  5 tablets by mouth for 1 day followed by  4 tablets by mouth for 1 day followed by  3 tablets by mouth for 1 day followed by  2 tablets by mouth for 1 day followed by  1 tablet by mouth for a day and stop Patient not taking: Reported on 01/15/2019 12/27/18   Nicholes Mango, MD      VITAL SIGNS:  Blood pressure (!) 95/52, pulse 70,  temperature (!) 97.5 F (36.4 C), temperature source Oral, resp. rate 18, height 5' (1.524 m), weight 52.2 kg, SpO2 99 %.  PHYSICAL EXAMINATION:  GENERAL:  85 y.o.-year-old patient lying in the bed with no acute distress.  EYES: Pupils equal, round, reactive to light and accommodation. No scleral icterus. Extraocular muscles intact.  HEENT: Head atraumatic, normocephalic. Oropharynx and nasopharynx clear.  NECK:  Supple, no jugular venous distention. No thyroid enlargement, no tenderness.  LUNGS: Normal breath sounds bilaterally, no wheezing, rales,rhonchi or crepitation. No use of accessory muscles of respiration.  CARDIOVASCULAR: S1, S2 normal. No murmurs, rubs, or gallops.  ABDOMEN: Soft, nontender, nondistended. Bowel sounds present. No organomegaly or mass.  EXTREMITIES: Right leg swelling, redness all the way up to the right knee.  Does have edema of both legs.  Left leg does have erythema but no redness. NEUROLOGIC: Cranial nerves II through XII are intact. Muscle strength 5/5 in all extremities. Sensation intact. Gait not checked.  PSYCHIATRIC: The patient is alert and oriented x 3.  SKIN: No obvious rash, lesion, or ulcer.   LABORATORY PANEL:   CBC Recent Labs  Lab 01/17/19 0947  WBC 7.5  HGB 11.4*  HCT 35.1*  PLT 260    ------------------------------------------------------------------------------------------------------------------  Chemistries  Recent Labs  Lab 01/17/19 0947  NA 133*  K 3.6  CL 95*  CO2 30  GLUCOSE 117*  BUN 18  CREATININE 0.56  CALCIUM 8.5*  AST 25  ALT 24  ALKPHOS 79  BILITOT 0.3   ------------------------------------------------------------------------------------------------------------------  Cardiac Enzymes No results for input(s): TROPONINI in the last 168 hours. ------------------------------------------------------------------------------------------------------------------  RADIOLOGY:  US Venous Img Lower Unilateral Right  Result Date: 01/17/2019 CLINICAL DATA:  Cellulitis, leg edema, possible DVT EXAM: Right LOWER EXTREMITY VENOUS DOPPLER ULTRASOUND TECHNIQUE: Gray-scale sonography with graded compression, as well as color Doppler and duplex ultrasound were performed to evaluate the lower extremity deep venous systems from the level of the common femoral vein and including the common femoral, femoral, profunda femoral, popliteal and calf veins including the posterior tibial, peroneal and gastrocnemius veins when visible. The superficial great saphenous vein was also interrogated. Spectral Doppler was utilized to evaluate flow at rest and with distal augmentation maneuvers in the common femoral, femoral and popliteal veins. COMPARISON:  None. FINDINGS: Contralateral Common Femoral Vein: Respiratory phasicity is normal and symmetric with the symptomatic side. No evidence of thrombus. Normal compressibility. Common Femoral Vein: No evidence of thrombus. Normal compressibility, respiratory phasicity and response to augmentation. Saphenofemoral Junction: No evidence of thrombus. Normal compressibility and flow on color Doppler imaging. Profunda Femoral Vein: No evidence of thrombus. Normal compressibility and flow on color Doppler imaging. Femoral Vein: No evidence of  thrombus. Normal compressibility, respiratory phasicity and response to augmentation. Popliteal Vein: No evidence of thrombus. Normal compressibility, respiratory phasicity and response to augmentation. Calf Veins: No evidence of thrombus. Normal compressibility and flow on color Doppler imaging. Superficial Great Saphenous Vein: No evidence of thrombus. Normal compressibility. Venous Reflux:  None. Other Findings:  Subcutaneous edema in the calf. IMPRESSION: No evidence of deep venous thrombosis in the visualized right lower extremity. Electronically Signed   By: Julian Hy M.D.   On: 01/17/2019 10:59    EKG:   Orders placed or performed during the hospital encounter of 12/24/18  . ED EKG  . ED EKG    IMPRESSION AND PLAN:   86 year old female patient with failed outpatient antibiotic therapy for right leg cellulitis. 1.  Right leg cellulitis, failed outpatient therapy with  worsening redness, admit observation status, start Vanco, Ancef, demarcation of right leg cellulitis area for follow-up on improvement/worsening.  Ultrasound of right leg negative for DVT.  Also will do a small dose of Lasix to decrease edema. #2/ slight hypotension, hold Cardizem, today,  the BP improves  can give Cardizem, enalapril tomorrow. 3. depression, continue mirtazapine 15 mg daily 4.  Hypothyroid; continue Synthyroid.  All the records are reviewed and case discussed with ED provider. Management plans discussed with the patient, family and they are in agreement.  CODE STATUS: Full code TOTAL TIME TAKING CARE OF THIS PATIENT: 55 minutes.    Epifanio Lesches M.D on 01/17/2019 at 12:05 PM  Between 7am to 6pm - Pager - [phone]  After 6pm go to [email or web address] - password [email or web address] [facility]  Office  [phone]  CC: Primary care physician; Jerrol Banana., MD  Note: This dictation was prepared with Dragon dictation along with smaller phrase technology. Any  transcriptional errors that result from this process are unintentional.

## 2019-01-17 NOTE — ED Triage Notes (Signed)
Pt given amoxicillin Wednesday for cellulitis of RLE.  Pt not improving so went back to doctor Friday and was switched to clindamycin. RLE still getting worse so pt presents to ED. significant swelling to RLE and redness. No fever.

## 2019-01-17 NOTE — ED Notes (Signed)
Repositioned pt in bed. Verbalized comfort.

## 2019-01-17 NOTE — Care Management Obs Status (Signed)
[place] NOTIFICATION   Patient Details  Name: Joyce [name] MRN: [id number] Date of Birth: July 25, 1928   Medicare Observation Status Notification Given:  Yes    Hillary Schwegler A [place]ale Swamy, RN 01/17/2019, 2:30 PM

## 2019-01-17 NOTE — ED Notes (Signed)
MD at bedside. 

## 2019-01-18 ENCOUNTER — Ambulatory Visit: Payer: Medicare Other | Admitting: Physician Assistant

## 2019-01-18 DIAGNOSIS — F329 Major depressive disorder, single episode, unspecified: Secondary | ICD-10-CM | POA: Diagnosis not present

## 2019-01-18 DIAGNOSIS — I959 Hypotension, unspecified: Secondary | ICD-10-CM | POA: Diagnosis not present

## 2019-01-18 DIAGNOSIS — L03115 Cellulitis of right lower limb: Secondary | ICD-10-CM | POA: Diagnosis not present

## 2019-01-18 LAB — BASIC METABOLIC PANEL
Anion gap: 11 (ref 5–15)
BUN: 22 mg/dL (ref 8–23)
CO2: 27 mmol/L (ref 22–32)
Calcium: 8.5 mg/dL — ABNORMAL LOW (ref 8.9–10.3)
Chloride: 95 mmol/L — ABNORMAL LOW (ref 98–111)
Creatinine, Ser: 0.54 mg/dL (ref 0.44–1.00)
GFR calc Af Amer: >60 mL/min (ref >60)
GFR calc non Af Amer: >60 mL/min (ref >60)
GLUCOSE: 104 mg/dL — AB (ref 70–99)
Potassium: 3.7 mmol/L (ref 3.5–5.1)
Sodium: 133 mmol/L — ABNORMAL LOW (ref 135–145)

## 2019-01-18 LAB — CBC
HCT: 34.6 % — ABNORMAL LOW (ref 36.0–46.0)
Hemoglobin: 11.3 g/dL — ABNORMAL LOW (ref 12.0–15.0)
MCH: 29.7 pg (ref 26.0–34.0)
MCHC: 32.7 g/dL (ref 30.0–36.0)
MCV: 90.8 fL (ref 80.0–100.0)
Platelets: 288 10*3/uL (ref 150–400)
RBC: 3.81 MIL/uL — ABNORMAL LOW (ref 3.87–5.11)
RDW: 14.6 % (ref 11.5–15.5)
WBC: 6.6 10*3/uL (ref 4.0–10.5)
nRBC: 0 % (ref 0.0–0.2)

## 2019-01-18 LAB — GLUCOSE, CAPILLARY: Glucose-Capillary: 87 mg/dL (ref 70–99)

## 2019-01-18 MED ORDER — VANCOMYCIN HCL 1000 MG IV SOLR
1000.0000 mg | INTRAVENOUS | Status: DC
  Filled 2019-01-18: qty 1000

## 2019-01-18 MED ORDER — VANCOMYCIN HCL IN DEXTROSE 1-5 GM/200ML-% IV SOLN
1000.0000 mg | INTRAVENOUS | Status: DC
  Administered 2019-01-18: 1000 mg via INTRAVENOUS
  Filled 2019-01-18: qty 200

## 2019-01-18 NOTE — Progress Notes (Signed)
PHARMACIST - PHYSICIAN COMMUNICATION  CONCERNING: P&T Medication Policy Regarding Oral Bisphosphonates  RECOMMENDATION: Your order for alendronate (Fosamax), ibandronate (Boniva), or risedronate (Actonel) has been discontinued at this time.  If the patient's post-hospital medical condition warrants safe use of this class of drugs, please resume the pre-hospital regimen upon discharge.  DESCRIPTION:  Alendronate (Fosamax), ibandronate (Boniva), and risedronate (Actonel) can cause severe esophageal erosions in patients who are unable to remain upright at least 30 minutes after taking this medication.   Since brief interruptions in therapy are thought to have minimal impact on bone mineral density, the [facility] has established that bisphosphonate orders should be routinely discontinued during hospitalization.   To override this safety policy and permit administration of Boniva, Fosamax, or Actonel in the hospital, prescribers must write "DO NOT HOLD" in the comments section when placing the order for this class of medications.   Pernell Dupre, PharmD, BCPS Clinical Pharmacist 01/18/2019 8:36 AM

## 2019-01-18 NOTE — Consult Note (Signed)
Pharmacy Antibiotic Note  Joyce [name] is a 85 y.o. female admitted on 01/17/2019 with cellulitis.  Pharmacy has been consulted for vancomycin dosing. She presented to the ED for cellulitis of the right lower extremity.  She was given amoxicillin on Wednesday for cellulitis but on Friday after she was no better she was switched to clindamycin.  She reports continued swelling and erythema to the right lower extremity below the knee.  Patient is also receiving cefazolin 1g every 8 hours.   Plan: Will adjust dose to Vancomycin 1000 mg IV Q 36 hrs starting 24 hours. Goal AUC 400-550,  Expected AUC: 496 Css Min: 11 SCr used: 0.8 (rounded up from 0.56)  Height: 5' (152.4 cm) Weight: 123 lb 3.2 oz (55.9 kg) IBW/kg (Calculated) : 45.5  Temp (24hrs), Avg:97.7 F (36.5 C), Min:97.5 F (36.4 C), Max:98.3 F (36.8 C)  Recent Labs  Lab 01/17/19 0947 01/18/19 0302  WBC 7.5 6.6  CREATININE 0.56 0.54    Estimated Creatinine Clearance: 38.1 mL/min (by C-G formula based on SCr of 0.54 mg/dL).    Antimicrobials this admission: vancomycin 3/15 >>  Cefazolin 3/15 >>   Microbiology results: 3/15 BCx: pending  Thank you for allowing pharmacy to be a part of this patient's care.  Pernell Dupre, PharmD, BCPS Clinical Pharmacist 01/18/2019 8:43 AM

## 2019-01-18 NOTE — TOC Progression Note (Signed)
Transition of Care (TOC) - Progression Note  Spoke with daughter Joyce [name] and reviewed the different home health agencies, she stated that last time they were set up with [facility] and they do not want to use them again.  She Chose [facility] for home health services.  I notified Joyce [name] with [facility] that the patient does not want to continue with their services at DC.  Notified Joyce [name] with [facility] that the patient has chosen [facility]. No DME needs  Patient Details  Name: Joyce [name] MRN: [id number] Date of Birth: September 27, 1928  Transition of Care [facility]) CM/SW [place], RN Phone Number: 01/18/2019, 1:51 PM  Clinical Narrative:         Barriers to Discharge: Continued Medical Work up  Expected Discharge Plan and Services   Discharge Planning Services: CM Consult Post Acute Care Choice: [place] arrangements for the past 2 months: Single Family Home                 DME Arranged: (none needed)   HH Arranged: PT     Social Determinants of Health (SDOH) Interventions    Readmission Risk Interventions 30 Day Unplanned Readmission Risk Score     ED to Hosp-Admission (Current) from 01/17/2019 in [facility] [place] (1A)  30 Day Unplanned Readmission Risk Score (%)  26 Filed at 01/18/2019 1200     This score is the patient's risk of an unplanned readmission within 30 days of being discharged (0 -100%). The score is based on dignosis, age, lab data, medications, orders, and past utilization.   Low:  0-14.9   Medium: 15-21.9   High: 22-29.9   Extreme: 30 and above       No flowsheet data found.

## 2019-01-18 NOTE — TOC Initial Note (Signed)
Transition of Care (TOC) - Initial/Assessment Note   Met with the patient and discussed DC plan and needs, The patient has a 4 wheeled walker and does not feel she needs another, has a raised toilet and a shower chair.  Moved down a few months ago in with Daughter from [place]. Daughter provides transportation.  She wants me to discuss HH PT services with the daughter [name] Creech [phone] Patient plans to DC back home with her daughter, Uses [facility] for medications and PCP is Dr. Gilbert   Patient Details  Name: Joyce [name] MRN: [id number] Date of Birth: 10/16/1928  Transition of Care (TOC) CM/SW Contact:    Deliliah J Gregory, RN Phone Number: 01/18/2019, 1:40 PM  Clinical Narrative:                     Barriers to Discharge: Continued Medical Work up   Patient Goals and CMS Choice Patient states their goals for this hospitalization and ongoing recovery are:: go home CMS Medicare.gov Compare Post Acute Care list provided to:: Patient Choice offered to / list presented to : Patient  Expected Discharge Plan and Services   Discharge Planning Services: CM Consult Post Acute Care Choice: Home Health Living arrangements for the past 2 months: Single Family Home                 DME Arranged: (none needed)   HH Arranged: PT    Prior Living Arrangements/Services Living arrangements for the past 2 months: Single Family Home Lives with:: Adult Children Patient language and need for interpreter reviewed:: Yes Do you feel safe going back to the place where you live?: Yes      Need for Family Participation in Patient Care: No (Comment) Care giver support system in place?: Yes (comment) Current home services: DME(have a 4 wheeled walker, raised toilet, shower chair) Criminal Activity/Legal Involvement Pertinent to Current Situation/Hospitalization: No - Comment as needed  Activities of Daily Living Home Assistive Devices/Equipment: Eyeglasses, Walker (specify type) ADL  Screening (condition at time of admission) Patient's cognitive ability adequate to safely complete daily activities?: Yes Is the patient deaf or have difficulty hearing?: No Does the patient have difficulty seeing, even when wearing glasses/contacts?: No Does the patient have difficulty concentrating, remembering, or making decisions?: No Patient able to express need for assistance with ADLs?: Yes Does the patient have difficulty dressing or bathing?: No Independently performs ADLs?: Yes (appropriate for developmental age) Does the patient have difficulty walking or climbing stairs?: Yes Weakness of Legs: Both Weakness of Arms/Hands: None  Permission Sought/Granted Permission sought to share information with : Case Manager Permission granted to share information with : Yes, Verbal Permission Granted     Permission granted to share info w AGENCY: HH agency        Emotional Assessment Appearance:: Appears stated age Attitude/Demeanor/Rapport: Gracious, Engaged Affect (typically observed): Accepting Orientation: : Oriented to Self, Oriented to Place, Oriented to  Time, Oriented to Situation Alcohol / Substance Use: Never Used Psych Involvement: No (comment)  Admission diagnosis:  Cellulitis of right lower extremity [L03.115] Patient Active Problem List   Diagnosis Date Noted  . Cellulitis of right leg 01/17/2019  . Cellulitis 01/17/2019  . Pressure injury of skin 01/17/2019  . Multifocal pneumonia 12/24/2018  . GERD (gastroesophageal reflux disease) 12/24/2018  . COPD with acute exacerbation (HCC) 12/24/2018  . Acute respiratory failure with hypoxia and hypercapnia (HCC) 12/24/2018  . Other nonrheumatic aortic valve disorders 10/14/2017  . Do not   resuscitate status 08/27/2017  . Personal history of malignant neoplasm of breast 08/27/2017  . Chronic venous hypertension (idiopathic) without complications of bilateral lower extremity 07/25/2017  . Tachycardia, unspecified  02/25/2017  . Hyponatremia 11/22/2016  . Osteoporosis 09/24/2016  . 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 Hematuria 07/02/2016  . Mass of hand 06/04/2016  . Other specified soft tissue disorders 06/04/2016  . Ascending aorta dilatation (HCC) 05/15/2016  . Generalized anxiety disorder 10/24/2015  . Postoperative hypothyroidism 12/15/2013  . Papillary carcinoma of thyroid ([place]) 11/26/2013  . Primary hyperparathyroidism ([place]) 04/01/2013  . Osteoarthritis of knee 03/23/2013  . Nephrolithiasis 02/03/2013  . Malignant neoplasm of female breast ([place]) 11/28/2009  . Asthma 02/24/2009  . Bronchiectasis without acute exacerbation ([place]) 02/24/2009  . Arthropathy of hand 06/16/2008  . Hyperlipidemia 11/16/2007  . Essential hypertension 11/16/2007   PCP:  Jerrol Banana., MD Pharmacy:   [facility] [phone] - [address], [address] AT [address] [place] [address] Phone: [phone] Fax: [phone]  [place], [address] [place] [address] Phone: [phone] Fax: [phone]     Social Determinants of Health (SDOH) Interventions    Readmission Risk Interventions 30 Day Unplanned Readmission Risk Score     ED to Hosp-Admission (Current) from 01/17/2019 in [place] (1A)  30 Day Unplanned Readmission Risk Score (%)  26 Filed at 01/18/2019 1200     This score is the patient's risk of an unplanned readmission within 30 days of being discharged (0 -100%). The score is based on dignosis, age, lab data, medications, orders, and past utilization.   Low:  0-14.9   Medium: 15-21.9   High: 22-29.9   Extreme: 30 and above       No flowsheet data found.

## 2019-01-18 NOTE — Progress Notes (Addendum)
[place] [facility] [place] NAME: Joyce [name]    MR#:  [id number]  DATE OF BIRTH:  [birth year]  SUBJECTIVE:  patient came in with increasing redness and swelling of right lower extremity. She has had on and off last couple days. She finished around of antibiotic as outpatient. Denies any pain.  REVIEW OF SYSTEMS:   Review of Systems  Constitutional: Negative for chills, fever and weight loss.  HENT: Negative for ear discharge, ear pain and nosebleeds.   Eyes: Negative for blurred vision, pain and discharge.  Respiratory: Negative for sputum production, shortness of breath, wheezing and stridor.   Cardiovascular: Positive for leg swelling. Negative for chest pain, palpitations, orthopnea and PND.  Gastrointestinal: Negative for abdominal pain, diarrhea, nausea and vomiting.  Genitourinary: Negative for frequency and urgency.  Musculoskeletal: Negative for back pain and joint pain.  Skin:       Right lower extremity redness/erythema  Neurological: Negative for sensory change, speech change, focal weakness and weakness.  Psychiatric/Behavioral: Negative for depression and hallucinations. The patient is not nervous/anxious.    Tolerating Diet:yes Tolerating PT: uses walker at home  DRUG ALLERGIES:  No Active Allergies  VITALS:  Blood pressure 105/67, pulse 87, temperature (!) 97.5 F (36.4 C), resp. rate 17, height 5' (1.524 m), weight 55.9 kg, SpO2 97 %.  PHYSICAL EXAMINATION:   Physical Exam  GENERAL:  85 y.o.-year-old patient lying in the bed with no acute distress.  EYES: Pupils equal, round, reactive to light and accommodation. No scleral icterus. Extraocular muscles intact.  HEENT: Head atraumatic, normocephalic. Oropharynx and nasopharynx clear.  NECK:  Supple, no jugular venous distention. No thyroid enlargement, no tenderness.  LUNGS: Normal breath sounds bilaterally, no wheezing, rales, rhonchi. No use of accessory muscles  of respiration.  CARDIOVASCULAR: S1, S2 normal. No murmurs, rubs, or gallops.  ABDOMEN: Soft, nontender, nondistended. Bowel sounds present. No organomegaly or mass.  EXTREMITIES: right lower extremity cellulitis with swelling.    NEUROLOGIC: Cranial nerves II through XII are intact. No focal Motor or sensory deficits b/l.   PSYCHIATRIC:  patient is alert and oriented x 3.  SKIN: No obvious rash, lesion, or ulcer.   LABORATORY PANEL:  CBC Recent Labs  Lab 01/18/19 0302  WBC 6.6  HGB 11.3*  HCT 34.6*  PLT 288    Chemistries  Recent Labs  Lab 01/17/19 0947 01/18/19 0302  NA 133* 133*  K 3.6 3.7  CL 95* 95*  CO2 30 27  GLUCOSE 117* 104*  BUN 18 22  CREATININE 0.56 0.54  CALCIUM 8.5* 8.5*  AST 25  --   ALT 24  --   ALKPHOS 79  --   BILITOT 0.3  --    Cardiac Enzymes No results for input(s): TROPONINI in the last 168 hours. RADIOLOGY:  US Venous Img Lower Unilateral Right  Result Date: 01/17/2019 CLINICAL DATA:  Cellulitis, leg edema, possible DVT EXAM: Right LOWER EXTREMITY VENOUS DOPPLER ULTRASOUND TECHNIQUE: Gray-scale sonography with graded compression, as well as color Doppler and duplex ultrasound were performed to evaluate the lower extremity deep venous systems from the level of the common femoral vein and including the common femoral, femoral, profunda femoral, popliteal and calf veins including the posterior tibial, peroneal and gastrocnemius veins when visible. The superficial great saphenous vein was also interrogated. Spectral Doppler was utilized to evaluate flow at rest and with distal augmentation maneuvers in the common femoral, femoral and popliteal veins. COMPARISON:  None. FINDINGS: Contralateral  Common Femoral Vein: Respiratory phasicity is normal and symmetric with the symptomatic side. No evidence of thrombus. Normal compressibility. Common Femoral Vein: No evidence of thrombus. Normal compressibility, respiratory phasicity and response to augmentation.  Saphenofemoral Junction: No evidence of thrombus. Normal compressibility and flow on color Doppler imaging. Profunda Femoral Vein: No evidence of thrombus. Normal compressibility and flow on color Doppler imaging. Femoral Vein: No evidence of thrombus. Normal compressibility, respiratory phasicity and response to augmentation. Popliteal Vein: No evidence of thrombus. Normal compressibility, respiratory phasicity and response to augmentation. Calf Veins: No evidence of thrombus. Normal compressibility and flow on color Doppler imaging. Superficial Great Saphenous Vein: No evidence of thrombus. Normal compressibility. Venous Reflux:  None. Other Findings:  Subcutaneous edema in the calf. IMPRESSION: No evidence of deep venous thrombosis in the visualized right lower extremity. Electronically Signed   By: Julian Hy M.D.   On: 01/17/2019 10:59   ASSESSMENT AND PLAN:  Joyce [name]  is a 85 y.o. female with a known history of anxiety, hypertension, hyperlipidemia, PUD, comes in because of worsening right leg cellulitis.  Has been we are dealing with right leg cellulitis for 2 weeks, patient saw PCP and was given amoxicillin 875 mg p.o. twice daily, prescribed on March 10  1.  recurrent cellulitis right lower extremity with edema -patient finish of course with Augmentin as outpatient. -Continue IV cefazolin -keep legs elevated -right lower extremity ultrasound negative for DVT -wound consult--- to see if patient a candidate for unna boot  2. Hypertension continue home meds  3. COPD stable inhalers as needed  4. Hyperlipidemia continue statins  5. DVT prophylaxis subcu Lovenox  Case discussed with Care Management/Social Worker. Management plans discussed with the patient and they are in agreement.  CODE STATUS: full  DVT Prophylaxis: lovenox  TOTAL TIME TAKING CARE OF THIS PATIENT: *30* minutes.  >50% time spent on counselling and coordination of care  POSSIBLE D/C IN *1-2 DAYS,  DEPENDING ON CLINICAL CONDITION.  Note: This dictation was prepared with Dragon dictation along with smaller phrase technology. Any transcriptional errors that result from this process are unintentional.  Fritzi Mandes M.D on 01/18/2019 at 2:23 PM  Between 7am to 6pm - Pager - [phone]  After 6pm go to [email or web address] - password [email or web address]  Office  [phone]  CC: Primary care physician; Jerrol Banana., MDPatient ID: Joyce [name], female   DOB: [birth year], 85 y.o.   MRN: [id number]

## 2019-01-19 DIAGNOSIS — F329 Major depressive disorder, single episode, unspecified: Secondary | ICD-10-CM | POA: Diagnosis not present

## 2019-01-19 DIAGNOSIS — L03115 Cellulitis of right lower limb: Secondary | ICD-10-CM | POA: Diagnosis not present

## 2019-01-19 DIAGNOSIS — I959 Hypotension, unspecified: Secondary | ICD-10-CM | POA: Diagnosis not present

## 2019-01-19 LAB — GLUCOSE, CAPILLARY: Glucose-Capillary: 81 mg/dL (ref 70–99)

## 2019-01-19 LAB — CREATININE, SERUM
Creatinine, Ser: 0.44 mg/dL (ref 0.44–1.00)
GFR calc Af Amer: >60 mL/min (ref >60)
GFR calc non Af Amer: >60 mL/min (ref >60)

## 2019-01-19 MED ORDER — DILTIAZEM HCL ER 120 MG PO CP24: 120.0000 mg | ORAL_CAPSULE | Freq: Every day | ORAL | 1 refills | Status: DC

## 2019-01-19 MED ORDER — CEPHALEXIN 500 MG PO CAPS: 500.0000 mg | ORAL_CAPSULE | Freq: Three times a day (TID) | ORAL | Status: DC

## 2019-01-19 MED ORDER — CEPHALEXIN 500 MG PO CAPS: 500.0000 mg | ORAL_CAPSULE | Freq: Three times a day (TID) | ORAL | 0 refills | Status: DC

## 2019-01-19 MED ORDER — COLLAGENASE 250 UNIT/GM EX OINT
TOPICAL_OINTMENT | Freq: Every day | CUTANEOUS | Status: DC
  Filled 2019-01-19: qty 30

## 2019-01-19 MED ORDER — COLLAGENASE 250 UNIT/GM EX OINT: TOPICAL_OINTMENT | Freq: Every day | CUTANEOUS | 0 refills | Status: DC

## 2019-01-19 NOTE — Discharge Summary (Signed)
[place] [facility] [place] NAME: Joyce [name]    MR#:  [id number]  DATE OF BIRTH:  07-Oct-1928  DATE OF ADMISSION:  01/17/2019 ADMITTING PHYSICIAN: Epifanio Lesches, MD  DATE OF DISCHARGE: 01/19/2019  PRIMARY CARE PHYSICIAN: Jerrol Banana., MD    ADMISSION DIAGNOSIS:  Cellulitis of right lower extremity [R51.884]  DISCHARGE DIAGNOSIS:  cellulitis right lower extremity right lower extremity swelling  SECONDARY DIAGNOSIS:   Past Medical History:  Diagnosis Date  . Anxiety   . Arthritis   . Asthma   . Asthma   . Breast cancer ([place])   . Cancer ([place])   . COPD (chronic obstructive pulmonary disease) ([place])   . Enlarged aorta ([place])   . GERD (gastroesophageal reflux disease)   . High cholesterol   . Hx of skin cancer, basal cell   . Hyperlipemia   . Hypertension   . PUD (peptic ulcer disease)   . Thyroid cancer [facility])     HOSPITAL COURSE:  RillaMilleris 85 y.o.femalewith a known history of anxiety, hypertension, hyperlipidemia, PUD, comes in because of worsening right leg cellulitis. Has been we are dealing with right leg cellulitis for 2 weeks, patient saw PCP and was given amoxicillin 875 mg p.o. twice daily, prescribed on March 10  1.recurrent cellulitis right lower extremity with edema -patient finish of course with Augmentin as outpatient. -Continue IV cefazolin--- po keflex for total 10 days. Redness improving -keep legs elevated -right lower extremity ultrasound negative for DVT x 2 -wound consult---input appreciated -follow this recommendation :would not use compression therapy until cellulitis resolves. Could benefit from [facility] to monitor and then place compression therapy or follow up with PCP to measure for compression stockings which she will need long term. She lives with her daughter that could assist with donning the stockings once the cellulitis and edema is better controlled. Enzymatic debridement to  the buttock wound daily.  1. Apply single layer of xeroform over the weeping areas of the RLE, cover with dry dressings. Wrap with conform or kerlix. Secure with ACE with no stretch. Elevate LEs as much as possible.  2. Apply 1/4" thick layer of Santyl to the inner upper buttock wound, cover with dry dressing. Change daily.  2. Hypertension continue home meds  3. COPD stable inhalers as needed  4. Hyperlipidemia continue statins  5. DVT prophylaxis subcu Lovenox  Pt will discharged to home with home health RN for right lower extremity wound care. Spoke with dter over the phone--agreeable   CONSULTS OBTAINED:    DRUG ALLERGIES:  No Active Allergies  DISCHARGE MEDICATIONS:   Allergies as of 01/19/2019   No Active Allergies     Medication List    STOP taking these medications   clindamycin 300 MG capsule Commonly known as:  Cleocin     TAKE these medications   acetaminophen 325 MG tablet Commonly known as:  TYLENOL Take 650 mg by mouth every 6 (six) hours as needed.   acidophilus Caps capsule Take 1 capsule by mouth daily.   albuterol 108 (90 Base) MCG/ACT inhaler Commonly known as:  PROVENTIL HFA;VENTOLIN HFA Inhale 1-2 puffs into the lungs every 4 (four) hours as needed for wheezing or shortness of breath.   alendronate 70 MG tablet Commonly known as:  FOSAMAX Take 1 tablet (70 mg total) by mouth once a week. Take with a full glass of water on an empty stomach.   atorvastatin 10 MG tablet Commonly known as:  LIPITOR Take 1 tablet (10 mg total) by mouth daily.   calcium carbonate 500 MG chewable tablet Commonly known as:  TUMS - dosed in mg elemental calcium Chew 2 tablets by mouth daily.   cephALEXin 500 MG capsule Commonly known as:  KEFLEX Take 1 capsule (500 mg total) by mouth 3 (three) times daily.   cholecalciferol 1000 units tablet Commonly known as:  VITAMIN D Take 2,000 Units by mouth daily.   collagenase ointment Commonly known as:   SANTYL Apply topically daily.   diltiazem 120 MG 24 hr capsule Commonly known as:  DILACOR XR Take 1 capsule (120 mg total) by mouth daily. Take 2 capsules by mouth every morning, and 1 capsule every evening. What changed:    how much to take  how to take this  when to take this   enalapril 20 MG tablet Commonly known as:  VASOTEC Take 1 tablet (20 mg total) by mouth daily.   feeding supplement (ENSURE ENLIVE) Liqd Take 237 mLs by mouth 2 (two) times daily between meals.   fesoterodine 8 MG Tb24 tablet Commonly known as:  TOVIAZ Take 1 tablet (8 mg total) by mouth daily.   Fluticasone-Salmeterol 500-50 MCG/DOSE Aepb Commonly known as:  ADVAIR Inhale 1 puff into the lungs daily.   levothyroxine 150 MCG tablet Commonly known as:  SYNTHROID, LEVOTHROID Take 1 tablet (150 mcg total) by mouth daily before breakfast.   LORazepam 0.5 MG tablet Commonly known as:  ATIVAN Take 0.5 tablets (0.25 mg total) by mouth 3 (three) times daily as needed for anxiety.   mirtazapine 15 MG tablet Commonly known as:  REMERON Take 1 tablet (15 mg total) by mouth at bedtime.   multivitamin with minerals Tabs tablet Take 1 tablet by mouth daily.       If you experience worsening of your admission symptoms, develop shortness of breath, life threatening emergency, suicidal or homicidal thoughts you must seek medical attention immediately by calling 911 or calling your MD immediately  if symptoms less severe.  You Must read complete instructions/literature along with all the possible adverse reactions/side effects for all the Medicines you take and that have been prescribed to you. Take any new Medicines after you have completely understood and accept all the possible adverse reactions/side effects.   Please note  You were cared for by a hospitalist during your hospital stay. If you have any questions about your discharge medications or the care you received while you were in the hospital  after you are discharged, you can call the unit and asked to speak with the hospitalist on call if the hospitalist that took care of you is not available. Once you are discharged, your primary care physician will handle any further medical issues. Please note that NO REFILLS for any discharge medications will be authorized once you are discharged, as it is imperative that you return to your primary care physician (or establish a relationship with a primary care physician if you do not have one) for your aftercare needs so that they can reassess your need for medications and monitor your lab values. Today   SUBJECTIVE   Feel ok  VITAL SIGNS:  Blood pressure 105/66, pulse 74, temperature (!) 97.5 F (36.4 C), temperature source Oral, resp. rate 18, height 5' (1.524 m), weight 51 kg, SpO2 94 %.  I/O:    Intake/Output Summary (Last 24 hours) at 01/19/2019 1022 Last data filed at 01/19/2019 0928 Gross per 24 hour  Intake 785.68 ml  Output -  Net 785.68 ml    PHYSICAL EXAMINATION:  GENERAL:  85 y.o.-year-old patient lying in the bed with no acute distress.  EYES: Pupils equal, round, reactive to light and accommodation. No scleral icterus. Extraocular muscles intact.  HEENT: Head atraumatic, normocephalic. Oropharynx and nasopharynx clear.  NECK:  Supple, no jugular venous distention. No thyroid enlargement, no tenderness.  LUNGS: Normal breath sounds bilaterally, no wheezing, rales,rhonchi or crepitation. No use of accessory muscles of respiration.  CARDIOVASCULAR: S1, S2 normal. No murmurs, rubs, or gallops.  ABDOMEN: Soft, non-tender, non-distended. Bowel sounds present. No organomegaly or mass.  EXTREMITIES: right lower extremity erythema improved much since admission. Swelling improving as well. Significant arthritis changes in both lower feet NEUROLOGIC: Cranial nerves II through XII are intact. Muscle strength 5/5 in all extremities. Sensation intact. Gait not checked.  PSYCHIATRIC:  The patient is alert and oriented x 3.  SKIN: 1. Weeping RLE with cellulitis 2. Unstageable pressure injury Pressure Injury POA: Yes Measurement: upper buttock wound 1cm x 0.5cm x 0.2cm  Wound bed: upper buttock 100% fibrinous with some dark non viable tissue  DATA REVIEW:   CBC  Recent Labs  Lab 01/18/19 0302  WBC 6.6  HGB 11.3*  HCT 34.6*  PLT 288    Chemistries  Recent Labs  Lab 01/17/19 0947 01/18/19 0302 01/19/19 0422  NA 133* 133*  --   K 3.6 3.7  --   CL 95* 95*  --   CO2 30 27  --   GLUCOSE 117* 104*  --   BUN 18 22  --   CREATININE 0.56 0.54 0.44  CALCIUM 8.5* 8.5*  --   AST 25  --   --   ALT 24  --   --   ALKPHOS 79  --   --   BILITOT 0.3  --   --     Microbiology Results   Recent Results (from the past 240 hour(s))  Blood culture (routine x 2)     Status: None (Preliminary result)   Collection Time: 01/17/19  9:51 AM  Result Value Ref Range Status   Specimen Description BLOOD RAC  Final   Special Requests   Final    BOTTLES DRAWN AEROBIC AND ANAEROBIC Blood Culture results may not be optimal due to an excessive volume of blood received in culture bottles   Culture   Final    NO GROWTH 2 DAYS Performed at [facility], [address] [place], [place] [address]    Report Status PENDING  Incomplete  Blood culture (routine x 2)     Status: None (Preliminary result)   Collection Time: 01/17/19  9:51 AM  Result Value Ref Range Status   Specimen Description BLOOD RFA  Final   Special Requests   Final    BOTTLES DRAWN AEROBIC AND ANAEROBIC Blood Culture results may not be optimal due to an excessive volume of blood received in culture bottles   Culture   Final    NO GROWTH 2 DAYS Performed at [facility], [address], [place], [place] [address]    Report Status PENDING  Incomplete    RADIOLOGY:  US Venous Img Lower Unilateral Right  Result Date: 01/17/2019 CLINICAL DATA:  Cellulitis, leg edema, possible DVT EXAM: Right  LOWER EXTREMITY VENOUS DOPPLER ULTRASOUND TECHNIQUE: Gray-scale sonography with graded compression, as well as color Doppler and duplex ultrasound were performed to evaluate the lower extremity deep venous systems from the level of the common  femoral vein and including the common femoral, femoral, profunda femoral, popliteal and calf veins including the posterior tibial, peroneal and gastrocnemius veins when visible. The superficial great saphenous vein was also interrogated. Spectral Doppler was utilized to evaluate flow at rest and with distal augmentation maneuvers in the common femoral, femoral and popliteal veins. COMPARISON:  None. FINDINGS: Contralateral Common Femoral Vein: Respiratory phasicity is normal and symmetric with the symptomatic side. No evidence of thrombus. Normal compressibility. Common Femoral Vein: No evidence of thrombus. Normal compressibility, respiratory phasicity and response to augmentation. Saphenofemoral Junction: No evidence of thrombus. Normal compressibility and flow on color Doppler imaging. Profunda Femoral Vein: No evidence of thrombus. Normal compressibility and flow on color Doppler imaging. Femoral Vein: No evidence of thrombus. Normal compressibility, respiratory phasicity and response to augmentation. Popliteal Vein: No evidence of thrombus. Normal compressibility, respiratory phasicity and response to augmentation. Calf Veins: No evidence of thrombus. Normal compressibility and flow on color Doppler imaging. Superficial Great Saphenous Vein: No evidence of thrombus. Normal compressibility. Venous Reflux:  None. Other Findings:  Subcutaneous edema in the calf. IMPRESSION: No evidence of deep venous thrombosis in the visualized right lower extremity. Electronically Signed   By: Julian Hy M.D.   On: 01/17/2019 10:59     CODE STATUS:     Code Status Orders  (From admission, onward)         Start     Ordered   01/17/19 1119  Full code  Continuous      01/17/19 1121        Code Status History    Date Active Date Inactive Code Status Order ID Comments User Context   12/25/2018 0238 12/27/2018 1907 Full Code [id number]  Lance Coon, MD ED   11/22/2016 1858 11/24/2016 1735 Full Code [id number]  Dustin Flock, MD Inpatient    Advance Directive Documentation     Most Recent Value  Type of Advance Directive  Healthcare Power of Attorney  Pre-existing out of facility DNR order (yellow form or pink MOST form)  -  "MOST" Form in Place?  -      TOTAL TIME TAKING CARE OF THIS PATIENT: 40 minutes.    Fritzi Mandes M.D on 01/19/2019 at 10:22 AM  Between 7am to 6pm - Pager - [phone] After 6pm go to [email or web address] - password [email or web address]  Office  [phone]  CC: Primary care physician; Jerrol Banana., MD

## 2019-01-19 NOTE — Consult Note (Signed)
[place] Nurse wound consult note Reason for Consult: cellulitis and sacrum Patient from home, noted to have RLE edema and cellulitis, marked by MD yesterday. Seems to be decreasing in coverage on the pretibial and calf areas. Patient is limited by her pain to elevate her legs.  The LLE is not as edematous but it does appear she has some venous stasis disease. She does not wear compression stockings.  Wound type: 1. Weeping RLE with cellulitis 2. Unstageable pressure injury Pressure Injury POA: Yes Measurement: upper buttock wound 1cm x 0.5cm x 0.2cm  Wound bed: upper buttock 100% fibrinous with some dark non viable tissue Drainage (amount, consistency, odor) none noted at the upper buttock wound; serous weeping noted from the RLE Periwound: edema with palpable pulses bilateral LEs, edema RLE>LLE Dressing procedure/placement/frequency: Discussed with Dr. Posey Pronto, would not use compression therapy until cellulitis resolves. Could benefit from [facility] to monitor and then place compression therapy or follow up with PCP to measure for compression stockings which she will need long term. She lives with her daughter that could assist with donning the stockings once the cellulitis and edema is better controlled. Enzymatic debridement to the buttock wound daily.   1. Apply single layer of xeroform over the weeping areas of the RLE, cover with dry dressings. Wrap with conform or kerlix. Secure with ACE with no stretch. Elevate LEs as much as possible.   2. Apply 1/4" thick layer of Santyl to the inner upper buttock wound, cover with dry dressing. Change daily.   Discussed POC with patient and bedside nurse.  Re consult if needed, will not follow at this time. Thanks  Danetra Glock [employer], RN,CWOCN, CNS, [place] [phone])

## 2019-01-19 NOTE — TOC Progression Note (Signed)
Transition of Care (TOC) - Progression Note  Home health has been set up thru the patient's [name] for [facility], Corene Cornea has accepted the patient, Wound care nursing at home ordered. No other needs  Patient Details  Name: Joyce [name] MRN: [id number] Date of Birth: [birth year]  Transition of Care [facility]) CM/SW [place], RN Phone Number: 01/19/2019, 1:33 PM  Clinical Narrative:         Barriers to Discharge: Transportation  Expected Discharge Plan and Services   Discharge Planning Services: CM Consult Post Acute Care Choice: [place] arrangements for the past 2 months: Single Family Home Expected Discharge Date: 01/19/19               DME Arranged: (none needed)   HH Arranged: PT     Social Determinants of Health (SDOH) Interventions    Readmission Risk Interventions 30 Day Unplanned Readmission Risk Score     ED to Hosp-Admission (Current) from 01/17/2019 in [place] (1A)  30 Day Unplanned Readmission Risk Score (%)  25 Filed at 01/19/2019 1200     This score is the patient's risk of an unplanned readmission within 30 days of being discharged (0 -100%). The score is based on dignosis, age, lab data, medications, orders, and past utilization.   Low:  0-14.9   Medium: 15-21.9   High: 22-29.9   Extreme: 30 and above       Readmission Risk Prevention Plan 01/19/2019  [place] or Home Care Consult Complete  Medication Review (RN Care Manager) Complete

## 2019-01-20 ENCOUNTER — Telehealth: Payer: Self-pay

## 2019-01-20 ENCOUNTER — Telehealth: Payer: Self-pay | Admitting: Family Medicine

## 2019-01-20 DIAGNOSIS — J441 Chronic obstructive pulmonary disease with (acute) exacerbation: Secondary | ICD-10-CM

## 2019-01-20 MED ORDER — ALBUTEROL SULFATE (5 MG/ML) 0.5% IN NEBU: 2.5000 mg | INHALATION_SOLUTION | Freq: Four times a day (QID) | RESPIRATORY_TRACT | 12 refills | Status: DC | PRN

## 2019-01-20 NOTE — Telephone Encounter (Signed)
Dr. Rosanna Randy, which nebulizer solution do you want to Rx for the patient? Please advise. Thanks!

## 2019-01-20 NOTE — Telephone Encounter (Signed)
Transition Care Management Follow-up Telephone Call  Date of discharge and from where: [facility] on 01/19/19  How have you been since you were released from the hospital? Doing fine, leg is still wrapped so daughter states she cannot see the wound but she declines any other abnormal s/s such as fever, pain or n/v/d.  Any questions or concerns? No   Items Reviewed:  Did the pt receive and understand the discharge instructions provided? Yes   Medications obtained and verified? Yes   Any new allergies since your discharge? No   Dietary orders reviewed? Yes  Do you have support at home? Yes lives with daughter.  Other (ie: DME, Home Health, etc) Home health aid is coming out to home today.   Functional Questionnaire: (I = Independent and D = Dependent)  Bathing/Dressing- I   Meal Prep- I, minimal cooking.  Eating- I  Maintaining continence- I  Transferring/Ambulation- Uses a walker.  Managing Meds- D   Follow up appointments reviewed:    PCP Hospital f/u appt confirmed? No, daughter would like to wait until f/u apt scheduled on 02/09/19.  [facility] f/u appt confirmed? N/A  Are transportation arrangements needed? No   If their condition worsens, is the pt aware to call  their PCP or go to the ED? Yes  Was the patient provided with contact information for the PCP's office or ED? Yes  Was the pt encouraged to call back with questions or concerns? Yes

## 2019-01-20 NOTE — Telephone Encounter (Signed)
Albuterol full dose every 4 hours as needed.

## 2019-01-20 NOTE — Telephone Encounter (Signed)
Sent Rx into the pharmacy  

## 2019-01-20 NOTE — Telephone Encounter (Signed)
I would postpone at least a  Month.

## 2019-01-20 NOTE — Telephone Encounter (Signed)
pts son in law called saying they picked up a nebulizer last week for Mrs. Cancio but they have not gotten a prescription for the solotion that goes in it.  Please advise  Adriana Simas  CB#  [phone]  Thanks  [place]

## 2019-01-20 NOTE — Telephone Encounter (Signed)
No HFU scheduled.  

## 2019-01-21 ENCOUNTER — Telehealth: Payer: Self-pay | Admitting: Family Medicine

## 2019-01-21 DIAGNOSIS — L03115 Cellulitis of right lower limb: Secondary | ICD-10-CM | POA: Diagnosis not present

## 2019-01-21 DIAGNOSIS — Z4800 Encounter for change or removal of nonsurgical wound dressing: Secondary | ICD-10-CM | POA: Diagnosis not present

## 2019-01-21 DIAGNOSIS — L89152 Pressure ulcer of sacral region, stage 2: Secondary | ICD-10-CM | POA: Diagnosis not present

## 2019-01-21 DIAGNOSIS — J449 Chronic obstructive pulmonary disease, unspecified: Secondary | ICD-10-CM | POA: Diagnosis not present

## 2019-01-21 DIAGNOSIS — R238 Other skin changes: Secondary | ICD-10-CM | POA: Diagnosis not present

## 2019-01-21 DIAGNOSIS — I10 Essential (primary) hypertension: Secondary | ICD-10-CM | POA: Diagnosis not present

## 2019-01-21 NOTE — Telephone Encounter (Signed)
Advised as below.  

## 2019-01-21 NOTE — Telephone Encounter (Signed)
Please review

## 2019-01-21 NOTE — Telephone Encounter (Signed)
Mardene Celeste with [facility] is calling asking for verbal orders to use three layer compression wraps three times a week for 9 weeks   Needs verbal asap.    CB#  [phone]  Thanks Con [place]os

## 2019-01-21 NOTE — Telephone Encounter (Signed)
Spoke with daughter an moved AWV and f/u to 03/11/19 @ 2:20 and 3:00 PM. Juluis Rainier! -MM

## 2019-01-21 NOTE — Telephone Encounter (Signed)
ok 

## 2019-01-22 DIAGNOSIS — J449 Chronic obstructive pulmonary disease, unspecified: Secondary | ICD-10-CM | POA: Diagnosis not present

## 2019-01-22 DIAGNOSIS — L03115 Cellulitis of right lower limb: Secondary | ICD-10-CM | POA: Diagnosis not present

## 2019-01-22 DIAGNOSIS — R238 Other skin changes: Secondary | ICD-10-CM | POA: Diagnosis not present

## 2019-01-22 DIAGNOSIS — Z4800 Encounter for change or removal of nonsurgical wound dressing: Secondary | ICD-10-CM | POA: Diagnosis not present

## 2019-01-22 DIAGNOSIS — L89152 Pressure ulcer of sacral region, stage 2: Secondary | ICD-10-CM | POA: Diagnosis not present

## 2019-01-22 DIAGNOSIS — I10 Essential (primary) hypertension: Secondary | ICD-10-CM | POA: Diagnosis not present

## 2019-01-22 LAB — CULTURE, BLOOD (ROUTINE X 2)
CULTURE: NO GROWTH
Culture: NO GROWTH

## 2019-01-25 DIAGNOSIS — Z4800 Encounter for change or removal of nonsurgical wound dressing: Secondary | ICD-10-CM | POA: Diagnosis not present

## 2019-01-25 DIAGNOSIS — J449 Chronic obstructive pulmonary disease, unspecified: Secondary | ICD-10-CM | POA: Diagnosis not present

## 2019-01-25 DIAGNOSIS — L89152 Pressure ulcer of sacral region, stage 2: Secondary | ICD-10-CM | POA: Diagnosis not present

## 2019-01-25 DIAGNOSIS — I10 Essential (primary) hypertension: Secondary | ICD-10-CM | POA: Diagnosis not present

## 2019-01-25 DIAGNOSIS — R238 Other skin changes: Secondary | ICD-10-CM | POA: Diagnosis not present

## 2019-01-25 DIAGNOSIS — L03115 Cellulitis of right lower limb: Secondary | ICD-10-CM | POA: Diagnosis not present

## 2019-01-26 DIAGNOSIS — Z4800 Encounter for change or removal of nonsurgical wound dressing: Secondary | ICD-10-CM | POA: Diagnosis not present

## 2019-01-26 DIAGNOSIS — R238 Other skin changes: Secondary | ICD-10-CM | POA: Diagnosis not present

## 2019-01-26 DIAGNOSIS — L03115 Cellulitis of right lower limb: Secondary | ICD-10-CM | POA: Diagnosis not present

## 2019-01-26 DIAGNOSIS — I10 Essential (primary) hypertension: Secondary | ICD-10-CM | POA: Diagnosis not present

## 2019-01-26 DIAGNOSIS — J449 Chronic obstructive pulmonary disease, unspecified: Secondary | ICD-10-CM | POA: Diagnosis not present

## 2019-01-26 DIAGNOSIS — L89152 Pressure ulcer of sacral region, stage 2: Secondary | ICD-10-CM | POA: Diagnosis not present

## 2019-01-27 ENCOUNTER — Telehealth: Payer: Self-pay

## 2019-01-27 DIAGNOSIS — Z4800 Encounter for change or removal of nonsurgical wound dressing: Secondary | ICD-10-CM | POA: Diagnosis not present

## 2019-01-27 DIAGNOSIS — L03115 Cellulitis of right lower limb: Secondary | ICD-10-CM | POA: Diagnosis not present

## 2019-01-27 DIAGNOSIS — R238 Other skin changes: Secondary | ICD-10-CM | POA: Diagnosis not present

## 2019-01-27 DIAGNOSIS — M79606 Pain in leg, unspecified: Secondary | ICD-10-CM

## 2019-01-27 DIAGNOSIS — J449 Chronic obstructive pulmonary disease, unspecified: Secondary | ICD-10-CM | POA: Diagnosis not present

## 2019-01-27 DIAGNOSIS — M7989 Other specified soft tissue disorders: Principal | ICD-10-CM

## 2019-01-27 DIAGNOSIS — I10 Essential (primary) hypertension: Secondary | ICD-10-CM | POA: Diagnosis not present

## 2019-01-27 DIAGNOSIS — L89152 Pressure ulcer of sacral region, stage 2: Secondary | ICD-10-CM | POA: Diagnosis not present

## 2019-01-27 NOTE — Telephone Encounter (Signed)
Mardene Celeste with [facility] is calling to report pt had a fall.  Pt is okay and not injured but she is required to call and report it.    She also states pt needs an order for  compression stockings([facility] [address])  Contact [phone]   Thanks,   -Mickel Baas

## 2019-01-27 NOTE — Telephone Encounter (Signed)
Please review. Thanks!  

## 2019-01-29 ENCOUNTER — Inpatient Hospital Stay
Admission: EM | Admit: 2019-01-29 | Discharge: 2019-01-31 | DRG: 177 | Disposition: A | Discharge status: Home Health | Payer: Medicare Other | Source: Ambulatory Visit | Attending: Family Medicine | Admitting: Family Medicine

## 2019-01-29 ENCOUNTER — Other Ambulatory Visit: Payer: Self-pay

## 2019-01-29 ENCOUNTER — Encounter: Payer: Self-pay | Admitting: Emergency Medicine

## 2019-01-29 ENCOUNTER — Telehealth: Payer: Self-pay

## 2019-01-29 ENCOUNTER — Emergency Department: Payer: Medicare Other

## 2019-01-29 ENCOUNTER — Inpatient Hospital Stay: Payer: Medicare Other

## 2019-01-29 DIAGNOSIS — Z4800 Encounter for change or removal of nonsurgical wound dressing: Secondary | ICD-10-CM | POA: Diagnosis not present

## 2019-01-29 DIAGNOSIS — E890 Postprocedural hypothyroidism: Secondary | ICD-10-CM | POA: Diagnosis present

## 2019-01-29 DIAGNOSIS — J690 Pneumonitis due to inhalation of food and vomit: Secondary | ICD-10-CM | POA: Diagnosis not present

## 2019-01-29 DIAGNOSIS — Z8711 Personal history of peptic ulcer disease: Secondary | ICD-10-CM | POA: Diagnosis not present

## 2019-01-29 DIAGNOSIS — Z9012 Acquired absence of left breast and nipple: Secondary | ICD-10-CM | POA: Diagnosis not present

## 2019-01-29 DIAGNOSIS — Z96653 Presence of artificial knee joint, bilateral: Secondary | ICD-10-CM | POA: Diagnosis present

## 2019-01-29 DIAGNOSIS — R296 Repeated falls: Secondary | ICD-10-CM | POA: Diagnosis present

## 2019-01-29 DIAGNOSIS — Z7951 Long term (current) use of inhaled steroids: Secondary | ICD-10-CM | POA: Diagnosis not present

## 2019-01-29 DIAGNOSIS — I872 Venous insufficiency (chronic) (peripheral): Secondary | ICD-10-CM | POA: Diagnosis present

## 2019-01-29 DIAGNOSIS — Z800 Family history of malignant neoplasm of digestive organs: Secondary | ICD-10-CM

## 2019-01-29 DIAGNOSIS — L03115 Cellulitis of right lower limb: Secondary | ICD-10-CM | POA: Diagnosis not present

## 2019-01-29 DIAGNOSIS — L03116 Cellulitis of left lower limb: Secondary | ICD-10-CM | POA: Diagnosis not present

## 2019-01-29 DIAGNOSIS — Z853 Personal history of malignant neoplasm of breast: Secondary | ICD-10-CM | POA: Diagnosis not present

## 2019-01-29 DIAGNOSIS — R0602 Shortness of breath: Secondary | ICD-10-CM | POA: Diagnosis not present

## 2019-01-29 DIAGNOSIS — Z7983 Long term (current) use of bisphosphonates: Secondary | ICD-10-CM

## 2019-01-29 DIAGNOSIS — J189 Pneumonia, unspecified organism: Secondary | ICD-10-CM | POA: Diagnosis not present

## 2019-01-29 DIAGNOSIS — R238 Other skin changes: Secondary | ICD-10-CM | POA: Diagnosis not present

## 2019-01-29 DIAGNOSIS — R0902 Hypoxemia: Secondary | ICD-10-CM | POA: Diagnosis not present

## 2019-01-29 DIAGNOSIS — M810 Age-related osteoporosis without current pathological fracture: Secondary | ICD-10-CM | POA: Diagnosis present

## 2019-01-29 DIAGNOSIS — F411 Generalized anxiety disorder: Secondary | ICD-10-CM | POA: Diagnosis present

## 2019-01-29 DIAGNOSIS — Z79890 Hormone replacement therapy (postmenopausal): Secondary | ICD-10-CM

## 2019-01-29 DIAGNOSIS — E785 Hyperlipidemia, unspecified: Secondary | ICD-10-CM | POA: Diagnosis present

## 2019-01-29 DIAGNOSIS — Z8701 Personal history of pneumonia (recurrent): Secondary | ICD-10-CM

## 2019-01-29 DIAGNOSIS — R05 Cough: Secondary | ICD-10-CM | POA: Diagnosis not present

## 2019-01-29 DIAGNOSIS — K219 Gastro-esophageal reflux disease without esophagitis: Secondary | ICD-10-CM | POA: Diagnosis present

## 2019-01-29 DIAGNOSIS — Z823 Family history of stroke: Secondary | ICD-10-CM | POA: Diagnosis not present

## 2019-01-29 DIAGNOSIS — R600 Localized edema: Secondary | ICD-10-CM | POA: Diagnosis not present

## 2019-01-29 DIAGNOSIS — I10 Essential (primary) hypertension: Secondary | ICD-10-CM | POA: Diagnosis present

## 2019-01-29 DIAGNOSIS — I959 Hypotension, unspecified: Secondary | ICD-10-CM | POA: Diagnosis present

## 2019-01-29 DIAGNOSIS — Z6821 Body mass index (BMI) 21.0-21.9, adult: Secondary | ICD-10-CM | POA: Diagnosis not present

## 2019-01-29 DIAGNOSIS — Z85850 Personal history of malignant neoplasm of thyroid: Secondary | ICD-10-CM | POA: Diagnosis not present

## 2019-01-29 DIAGNOSIS — J449 Chronic obstructive pulmonary disease, unspecified: Secondary | ICD-10-CM | POA: Diagnosis present

## 2019-01-29 DIAGNOSIS — L89152 Pressure ulcer of sacral region, stage 2: Secondary | ICD-10-CM | POA: Diagnosis not present

## 2019-01-29 DIAGNOSIS — R609 Edema, unspecified: Secondary | ICD-10-CM | POA: Diagnosis not present

## 2019-01-29 DIAGNOSIS — Z85828 Personal history of other malignant neoplasm of skin: Secondary | ICD-10-CM | POA: Diagnosis not present

## 2019-01-29 DIAGNOSIS — Z8249 Family history of ischemic heart disease and other diseases of the circulatory system: Secondary | ICD-10-CM

## 2019-01-29 DIAGNOSIS — J168 Pneumonia due to other specified infectious organisms: Secondary | ICD-10-CM | POA: Diagnosis not present

## 2019-01-29 DIAGNOSIS — R140 Abdominal distension (gaseous): Secondary | ICD-10-CM | POA: Diagnosis not present

## 2019-01-29 DIAGNOSIS — E43 Unspecified severe protein-calorie malnutrition: Secondary | ICD-10-CM | POA: Diagnosis present

## 2019-01-29 LAB — CBC WITH DIFFERENTIAL/PLATELET
ABS IMMATURE GRANULOCYTES: 0.09 10*3/uL — AB (ref 0.00–0.07)
Basophils Absolute: 0.1 10*3/uL (ref 0.0–0.1)
Basophils Relative: 1 %
Eosinophils Absolute: 0 10*3/uL (ref 0.0–0.5)
Eosinophils Relative: 0 %
HCT: 32.8 % — ABNORMAL LOW (ref 36.0–46.0)
Hemoglobin: 10.7 g/dL — ABNORMAL LOW (ref 12.0–15.0)
Immature Granulocytes: 1 %
Lymphocytes Relative: 20 %
Lymphs Abs: 2.2 10*3/uL (ref 0.7–4.0)
MCH: 29.6 pg (ref 26.0–34.0)
MCHC: 32.6 g/dL (ref 30.0–36.0)
MCV: 90.6 fL (ref 80.0–100.0)
Monocytes Absolute: 0.9 10*3/uL (ref 0.1–1.0)
Monocytes Relative: 9 %
Neutro Abs: 7.5 10*3/uL (ref 1.7–7.7)
Neutrophils Relative %: 69 %
Platelets: 309 10*3/uL (ref 150–400)
RBC: 3.62 MIL/uL — ABNORMAL LOW (ref 3.87–5.11)
RDW: 14.6 % (ref 11.5–15.5)
WBC: 10.7 10*3/uL — ABNORMAL HIGH (ref 4.0–10.5)
nRBC: 0 % (ref 0.0–0.2)

## 2019-01-29 LAB — COMPREHENSIVE METABOLIC PANEL
ALT: 19 U/L (ref 0–44)
AST: 27 U/L (ref 15–41)
Albumin: 3.4 g/dL — ABNORMAL LOW (ref 3.5–5.0)
Alkaline Phosphatase: 72 U/L (ref 38–126)
Anion gap: 10 (ref 5–15)
BUN: 19 mg/dL (ref 8–23)
CO2: 29 mmol/L (ref 22–32)
Calcium: 9 mg/dL (ref 8.9–10.3)
Chloride: 93 mmol/L — ABNORMAL LOW (ref 98–111)
Creatinine, Ser: 0.58 mg/dL (ref 0.44–1.00)
GFR calc non Af Amer: >60 mL/min (ref >60)
Glucose, Bld: 118 mg/dL — ABNORMAL HIGH (ref 70–99)
Potassium: 3.9 mmol/L (ref 3.5–5.1)
Sodium: 132 mmol/L — ABNORMAL LOW (ref 135–145)
Total Bilirubin: 0.5 mg/dL (ref 0.3–1.2)
Total Protein: 6.6 g/dL (ref 6.5–8.1)

## 2019-01-29 LAB — TSH: TSH: 6.213 u[IU]/mL — ABNORMAL HIGH (ref 0.350–4.500)

## 2019-01-29 LAB — LACTIC ACID, PLASMA
Lactic Acid, Venous: 1.7 mmol/L (ref 0.5–1.9)
Lactic Acid, Venous: 1.9 mmol/L (ref 0.5–1.9)

## 2019-01-29 MED ORDER — POLYETHYLENE GLYCOL 3350 17 G PO PACK: 17.0000 g | PACK | Freq: Every day | ORAL | Status: DC | PRN

## 2019-01-29 MED ORDER — SODIUM CHLORIDE 0.9 % IV SOLN
3.0000 g | Freq: Three times a day (TID) | INTRAVENOUS | Status: DC
  Administered 2019-01-29 – 2019-01-31 (×5): 3 g via INTRAVENOUS
  Filled 2019-01-29 (×7): qty 3

## 2019-01-29 MED ORDER — DOCUSATE SODIUM 100 MG PO CAPS
100.0000 mg | ORAL_CAPSULE | Freq: Two times a day (BID) | ORAL | Status: DC
  Administered 2019-01-29 – 2019-01-30 (×3): 100 mg via ORAL
  Filled 2019-01-29 (×4): qty 1

## 2019-01-29 MED ORDER — ENSURE ENLIVE PO LIQD
237.0000 mL | Freq: Two times a day (BID) | ORAL | Status: DC
  Administered 2019-01-30 – 2019-01-31 (×3): 237 mL via ORAL

## 2019-01-29 MED ORDER — CALCIUM CARBONATE ANTACID 500 MG PO CHEW
2.0000 | CHEWABLE_TABLET | Freq: Every day | ORAL | Status: DC
  Administered 2019-01-30 – 2019-01-31 (×2): 400 mg via ORAL
  Filled 2019-01-29 (×2): qty 2

## 2019-01-29 MED ORDER — VITAMIN D 25 MCG (1000 UNIT) PO TABS
2000.0000 [IU] | ORAL_TABLET | Freq: Every day | ORAL | Status: DC
  Administered 2019-01-30 – 2019-01-31 (×2): 2000 [IU] via ORAL
  Filled 2019-01-29 (×2): qty 2

## 2019-01-29 MED ORDER — ALBUTEROL SULFATE (2.5 MG/3ML) 0.083% IN NEBU: 2.5000 mg | INHALATION_SOLUTION | Freq: Four times a day (QID) | RESPIRATORY_TRACT | Status: DC | PRN

## 2019-01-29 MED ORDER — MIRTAZAPINE 15 MG PO TABS
15.0000 mg | ORAL_TABLET | Freq: Every day | ORAL | Status: DC
  Administered 2019-01-29 – 2019-01-30 (×2): 15 mg via ORAL
  Filled 2019-01-29 (×2): qty 1

## 2019-01-29 MED ORDER — RISAQUAD PO CAPS
1.0000 | ORAL_CAPSULE | Freq: Every day | ORAL | Status: DC
  Administered 2019-01-30 – 2019-01-31 (×2): 1 via ORAL
  Filled 2019-01-29 (×2): qty 1

## 2019-01-29 MED ORDER — SODIUM CHLORIDE 0.9% FLUSH: 3.0000 mL | Freq: Once | INTRAVENOUS | Status: DC

## 2019-01-29 MED ORDER — SODIUM CHLORIDE 0.9 % IV SOLN
1.0000 g | Freq: Once | INTRAVENOUS | Status: AC
  Administered 2019-01-29: 1 g via INTRAVENOUS
  Filled 2019-01-29: qty 10

## 2019-01-29 MED ORDER — ONDANSETRON HCL 4 MG/2ML IJ SOLN: 4.0000 mg | Freq: Four times a day (QID) | INTRAMUSCULAR | Status: DC | PRN

## 2019-01-29 MED ORDER — SODIUM CHLORIDE 0.9 % IV SOLN
INTRAVENOUS | Status: AC
  Administered 2019-01-29: 18:00:00 via INTRAVENOUS

## 2019-01-29 MED ORDER — ACETAMINOPHEN 325 MG PO TABS
650.0000 mg | ORAL_TABLET | Freq: Four times a day (QID) | ORAL | Status: DC | PRN
  Administered 2019-01-29: 650 mg via ORAL
  Filled 2019-01-29: qty 2

## 2019-01-29 MED ORDER — ENOXAPARIN SODIUM 40 MG/0.4ML ~~LOC~~ SOLN
40.0000 mg | SUBCUTANEOUS | Status: DC
  Administered 2019-01-29 – 2019-01-30 (×2): 40 mg via SUBCUTANEOUS
  Filled 2019-01-29 (×2): qty 0.4

## 2019-01-29 MED ORDER — ADULT MULTIVITAMIN W/MINERALS CH
1.0000 | ORAL_TABLET | Freq: Every day | ORAL | Status: DC
  Administered 2019-01-30 – 2019-01-31 (×2): 1 via ORAL
  Filled 2019-01-29 (×2): qty 1

## 2019-01-29 MED ORDER — MEDICAL COMPRESSION STOCKINGS MISC: 0 refills | Status: DC

## 2019-01-29 MED ORDER — IPRATROPIUM-ALBUTEROL 0.5-2.5 (3) MG/3ML IN SOLN
3.0000 mL | Freq: Once | RESPIRATORY_TRACT | Status: AC
  Administered 2019-01-29: 3 mL via RESPIRATORY_TRACT
  Filled 2019-01-29: qty 3

## 2019-01-29 MED ORDER — LEVOTHYROXINE SODIUM 50 MCG PO TABS
150.0000 ug | ORAL_TABLET | Freq: Every day | ORAL | Status: DC
  Administered 2019-01-30 – 2019-01-31 (×2): 150 ug via ORAL
  Filled 2019-01-29 (×2): qty 1

## 2019-01-29 MED ORDER — ENALAPRIL MALEATE 20 MG PO TABS
20.0000 mg | ORAL_TABLET | Freq: Every day | ORAL | Status: DC
  Filled 2019-01-29: qty 1

## 2019-01-29 MED ORDER — ATORVASTATIN CALCIUM 10 MG PO TABS
10.0000 mg | ORAL_TABLET | Freq: Every day | ORAL | Status: DC
  Administered 2019-01-29 – 2019-01-30 (×2): 10 mg via ORAL
  Filled 2019-01-29 (×2): qty 1

## 2019-01-29 MED ORDER — FESOTERODINE FUMARATE ER 8 MG PO TB24
8.0000 mg | ORAL_TABLET | Freq: Every day | ORAL | Status: DC
  Administered 2019-01-30 – 2019-01-31 (×2): 8 mg via ORAL
  Filled 2019-01-29 (×2): qty 1

## 2019-01-29 MED ORDER — DILTIAZEM HCL ER COATED BEADS 120 MG PO CP24
120.0000 mg | ORAL_CAPSULE | Freq: Every day | ORAL | Status: DC
  Administered 2019-01-30 – 2019-01-31 (×2): 120 mg via ORAL
  Filled 2019-01-29 (×3): qty 1

## 2019-01-29 MED ORDER — METHYLPREDNISOLONE SODIUM SUCC 125 MG IJ SOLR
125.0000 mg | Freq: Once | INTRAMUSCULAR | Status: AC
  Administered 2019-01-29: 125 mg via INTRAVENOUS
  Filled 2019-01-29: qty 2

## 2019-01-29 MED ORDER — ACETAMINOPHEN 650 MG RE SUPP: 650.0000 mg | Freq: Four times a day (QID) | RECTAL | Status: DC | PRN

## 2019-01-29 MED ORDER — MOMETASONE FURO-FORMOTEROL FUM 200-5 MCG/ACT IN AERO
2.0000 | INHALATION_SPRAY | Freq: Two times a day (BID) | RESPIRATORY_TRACT | Status: DC
  Administered 2019-01-29 – 2019-01-31 (×4): 2 via RESPIRATORY_TRACT
  Filled 2019-01-29: qty 8.8

## 2019-01-29 MED ORDER — VANCOMYCIN HCL IN DEXTROSE 1-5 GM/200ML-% IV SOLN
1000.0000 mg | Freq: Once | INTRAVENOUS | Status: AC
  Administered 2019-01-29: 1000 mg via INTRAVENOUS
  Filled 2019-01-29: qty 200

## 2019-01-29 MED ORDER — ONDANSETRON HCL 4 MG PO TABS: 4.0000 mg | ORAL_TABLET | Freq: Four times a day (QID) | ORAL | Status: DC | PRN

## 2019-01-29 NOTE — Telephone Encounter (Signed)
Mardene Celeste called back to let PA/MD know that family is taking patient to ED now. KW

## 2019-01-29 NOTE — ED Notes (Signed)
ED TO INPATIENT HANDOFF REPORT  ED Nurse Name and Phone #: Kirke Shaggy 22  S Name/Age/Gender Joyce [name] 85 y.o. female Room/Bed: ED11A/ED11A  Code Status   Code Status: Prior  Home/SNF/Other Home Patient oriented to: self, place, time and situation Is this baseline? Yes   Triage Complete: Triage complete  Chief Complaint sob  Triage Note Short of breath.  Had recent admit for pneumonia.     Allergies No Known Allergies  Level of Care/Admitting Diagnosis ED Disposition    ED Disposition Condition [place] [facility] Area: [place] [100120]  Level of Care: Med-Surg [16]  Diagnosis: Aspiration pneumonia [facility]) [[id number]]  Admitting Physician: Gladstone Lighter [[id number]]  Attending Physician: Gladstone Lighter [[id number]]  Estimated length of stay: past midnight tomorrow  Certification:: I certify this patient will need inpatient services for at least 2 midnights  PT Class (Do Not Modify): Inpatient [101]  PT Acc Code (Do Not Modify): Private [1]       B Medical/Surgery History Past Medical History:  Diagnosis Date  . Anxiety   . Arthritis   . Asthma   . Asthma   . Breast cancer ([place])   . Cancer ([place])   . COPD (chronic obstructive pulmonary disease) ([place])   . Enlarged aorta ([place])   . GERD (gastroesophageal reflux disease)   . High cholesterol   . Hx of skin cancer, basal cell   . Hyperlipemia   . Hypertension   . PUD (peptic ulcer disease)   . Thyroid cancer [facility])    Past Surgical History:  Procedure Laterality Date  . APPENDECTOMY    . BREAST SURGERY     left mastectomy  . CHOLECYSTECTOMY    . FRACTURE SURGERY    . JOINT REPLACEMENT    . MASTECTOMY    . REPAIR OF PERFORATED ULCER    . REPLACEMENT TOTAL KNEE BILATERAL    . STOMACH SURGERY    . THYROIDECTOMY       A IV Location/Drains/Wounds Patient Lines/Drains/Airways Status   Active Line/Drains/Airways    Name:   Placement date:   Placement time:   Site:    Days:   Peripheral IV 01/29/19 Right Forearm   01/29/19    -    Forearm   less than 1   Pressure Injury 01/17/19 Stage II -  Partial thickness loss of dermis presenting as a shallow open ulcer with a red, pink wound bed without slough.   01/17/19    1241     12          Intake/Output Last 24 hours No intake or output data in the 24 hours ending 01/29/19 1617  Labs/Imaging Results for orders placed or performed during the hospital encounter of 01/29/19 (from the past 48 hour(s))  Lactic acid, plasma     Status: None   Collection Time: 01/29/19  1:26 PM  Result Value Ref Range   Lactic Acid, Venous 1.7 0.5 - 1.9 mmol/L    Comment: Performed at [facility], [address] [place], [place] [address]  Comprehensive metabolic panel     Status: Abnormal   Collection Time: 01/29/19  1:26 PM  Result Value Ref Range   Sodium 132 (L) 135 - 145 mmol/L   Potassium 3.9 3.5 - 5.1 mmol/L   Chloride 93 (L) 98 - 111 mmol/L   CO2 29 22 - 32 mmol/L   Glucose, Bld 118 (H) 70 - 99 mg/dL   BUN 19 8 -  23 mg/dL   Creatinine, Ser 0.58 0.44 - 1.00 mg/dL   Calcium 9.0 8.9 - 10.3 mg/dL   Total Protein 6.6 6.5 - 8.1 g/dL   Albumin 3.4 (L) 3.5 - 5.0 g/dL   AST 27 15 - 41 U/L   ALT 19 0 - 44 U/L   Alkaline Phosphatase 72 38 - 126 U/L   Total Bilirubin 0.5 0.3 - 1.2 mg/dL   GFR calc non Af Amer >60 >60 mL/min   GFR calc Af Amer >60 >60 mL/min   Anion gap 10 5 - 15    Comment: Performed at [facility], [address] [place], [place] [address]  CBC with Differential     Status: Abnormal   Collection Time: 01/29/19  1:26 PM  Result Value Ref Range   WBC 10.7 (H) 4.0 - 10.5 K/uL   RBC 3.62 (L) 3.87 - 5.11 MIL/uL   Hemoglobin 10.7 (L) 12.0 - 15.0 g/dL   HCT 32.8 (L) 36.0 - 46.0 %   MCV 90.6 80.0 - 100.0 fL   MCH 29.6 26.0 - 34.0 pg   MCHC 32.6 30.0 - 36.0 g/dL   RDW 14.6 11.5 - 15.5 %   Platelets 309 150 - 400 K/uL   nRBC 0.0 0.0 - 0.2 %   Neutrophils Relative % 69 %   Neutro  Abs 7.5 1.7 - 7.7 K/uL   Lymphocytes Relative 20 %   Lymphs Abs 2.2 0.7 - 4.0 K/uL   Monocytes Relative 9 %   Monocytes Absolute 0.9 0.1 - 1.0 K/uL   Eosinophils Relative 0 %   Eosinophils Absolute 0.0 0.0 - 0.5 K/uL   Basophils Relative 1 %   Basophils Absolute 0.1 0.0 - 0.1 K/uL   Immature Granulocytes 1 %   Abs Immature Granulocytes 0.09 (H) 0.00 - 0.07 K/uL    Comment: Performed at [facility], [address], [place],  [address]   Dg Chest 2 View  Result Date: 01/29/2019 CLINICAL DATA:  Hypoxia. EXAM: CHEST - 2 VIEW COMPARISON:  Radiographs of January 06, 2019. FINDINGS: Stable cardiomegaly. Atherosclerosis of thoracic aorta is noted. No pneumothorax or pleural effusion is noted. Stable mild left basilar subsegmental atelectasis or infiltrate. Stable right middle lobe opacity is noted. Degenerative changes seen involving both shoulder joints. IMPRESSION: Stable bilateral lung opacities as described above. Aortic Atherosclerosis (ICD10-I70.0). Electronically Signed   By: Marijo Conception, M.D.   On: 01/29/2019 13:51   Ct Chest Wo Contrast  Result Date: 01/29/2019 CLINICAL DATA:  Hypoxia, shortness of breath, pneumonia. EXAM: CT CHEST WITHOUT CONTRAST TECHNIQUE: Multidetector CT imaging of the chest was performed following the standard protocol without IV contrast. COMPARISON:  Chest x-ray today and prior chest x-ray on 01/06/2019 FINDINGS: Cardiovascular: The heart is mildly enlarged. There are calcifications associated with the mitral and aortic valves. The ascending thoracic aorta is dilated and measures 4.5 cm in estimated maximal diameter. No pericardial fluid identified. Central pulmonary arteries are normal in caliber. Calcified plaque likely present in the distribution of the LAD. Mediastinum/Nodes: No enlarged mediastinal, hilar or axillary lymph nodes identified. The trachea is normally patent. Lungs/Pleura: There is dense atelectasis and consolidation of the right  middle lobe with air bronchograms and bronchiectasis present. Consolidation of the posterior left lower lobe also present demonstrating bronchiectasis. Bronchiectasis present in the posterior right lower lobe with several distal bronchi demonstrating mucous plugging. No evidence of pulmonary edema, pleural effusions or pneumothorax. No focal nodules detected. Upper Abdomen: No acute  abnormality. Musculoskeletal: No chest wall mass or suspicious bone lesions identified. IMPRESSION: 1. Dense atelectasis and consolidation of the right middle lobe and posterior left lower lobe demonstrating air bronchograms as well as bronchiectasis. Bronchiectasis also present in the posterior right lower lobe with evidence of distal mucus plugging. 2. Aneurysmal disease of the ascending thoracic aorta measuring 4.5 cm in estimated maximal diameter. This may be on the basis of aortic valvular disease given evidence of valvular calcification. Ascending thoracic aortic aneurysm. Recommend semi-annual imaging followup by CTA or MRA and referral to cardiothoracic surgery if not already obtained. This recommendation follows 2010 ACCF/AHA/AATS/ACR/ASA/SCA/SCAI/SIR/STS/SVM Guidelines for the Diagnosis and Management of Patients With Thoracic Aortic Disease. Circulation. 2010; 121: T700-F749. Aortic aneurysm NOS (ICD10-I71.9) 3. Cardiac enlargement and probable coronary atherosclerosis involving the LAD. Aortic aneurysm NOS (ICD10-I71.9). Electronically Signed   By: Aletta Edouard M.D.   On: 01/29/2019 15:27    Pending Labs Unresulted Labs (From admission, onward)    Start     Ordered   01/29/19 1326  Lactic acid, plasma  Now then every 2 hours,   STAT     01/29/19 1325   01/29/19 1326  Urinalysis, Complete w Microscopic  ONCE - STAT,   STAT     01/29/19 1325   Signed and Held  Basic metabolic panel  Tomorrow morning,   R     Signed and Held   Signed and Held  CBC  Tomorrow morning,   R     Signed and Held   Signed and Held  TSH   Add-on,   R     Signed and Held          Vitals/Pain Today's Vitals   01/29/19 1311 01/29/19 1313 01/29/19 1315 01/29/19 1500  BP:    (!) 108/55  Pulse:   88 74  Resp:  20  16  Temp:  98.1 F (36.7 C)    TempSrc:  Oral    SpO2:   99% 100%  Weight: 51 kg     Height: 5' (1.524 m)     PainSc: 0-No pain       Isolation Precautions No active isolations  Medications Medications  vancomycin (VANCOCIN) IVPB 1000 mg/200 mL premix (1,000 mg Intravenous New Bag/Given 01/29/19 1543)  Ampicillin-Sulbactam (UNASYN) 3 g in sodium chloride 0.9 % 100 mL IVPB (has no administration in time range)  cefTRIAXone (ROCEPHIN) 1 g in sodium chloride 0.9 % 100 mL IVPB (0 g Intravenous Stopped 01/29/19 1520)  ipratropium-albuterol (DUONEB) 0.5-2.5 (3) MG/3ML nebulizer solution 3 mL (3 mLs Nebulization Given 01/29/19 1545)  ipratropium-albuterol (DUONEB) 0.5-2.5 (3) MG/3ML nebulizer solution 3 mL (3 mLs Nebulization Given 01/29/19 1545)  ipratropium-albuterol (DUONEB) 0.5-2.5 (3) MG/3ML nebulizer solution 3 mL (3 mLs Nebulization Given 01/29/19 1545)  methylPREDNISolone sodium succinate (SOLU-MEDROL) 125 mg/2 mL injection 125 mg (125 mg Intravenous Given 01/29/19 1431)    Mobility walks with device High fall risk   Focused Assessments Pulmonary Assessment Handoff:  Lung sounds:   O2 Device: Room Air        R Recommendations: See Admitting Provider Note  Report given to:   Additional Notes:

## 2019-01-29 NOTE — ED Triage Notes (Signed)
Short of breath.  Had recent admit for pneumonia.

## 2019-01-29 NOTE — ED Notes (Addendum)
Pt POV from doctor with sats in low 80s. Pt labored. Just discharged from hospital for PNA. Swelling in legs. + cough.  Mask applied and brought around to room 11.  Also concern about cellulitis

## 2019-01-29 NOTE — Telephone Encounter (Signed)
Joyce [name] was advised as directed below.

## 2019-01-29 NOTE — H&P (Signed)
[facility] NAME: Joyce [name]    MR#:  [id number]  DATE OF BIRTH:  28-Mar-1928  DATE OF ADMISSION:  01/29/2019  PRIMARY CARE PHYSICIAN: Jerrol Banana., MD   REQUESTING/REFERRING PHYSICIAN: Dr. Rudene Re  CHIEF COMPLAINT:   Chief Complaint  Patient presents with   Shortness of Breath    HISTORY OF PRESENT ILLNESS:  Joyce [name]  is a 85 y.o. female with a known history of breast cancer status post left mastectomy, COPD not on home oxygen, GERD, hypertension, peptic ulcer disease, history of thyroid cancer status post subtotal thyroidectomy presents to hospital from home secondary to lower extremity swelling, recurrent falls and worsening cough and congestion. Patient is a poor historian.  Patient states that she has moved from [place] and has been living with her daughter for the last 6 months.  She has had 2 hospitalizations within the last 2 months.  In February she was admitted for right-sided pneumonia.  She was just discharged from the hospital for right leg cellulitis 10 days ago on Keflex.  She was discharged with home health, right leg cellulitis has been improving.  However according to daughter patient has had falls in the last couple of days and she started noticing worsening swelling of her lower extremities and now her left lower extremity is more erythematous.  Also her lungs were sounding wet and her oxygen was low at 84% on room air.  Her PCP was called and they have advised her to come to the hospital.  Patient was noted to be in labored breathing here, initially hypoxic but oxygen saturations have improved.  CT chest showing worsening right-sided pneumonia with new consolidation in the right middle lobe.  Also has left lower extremity cellulitis.  Wbc elevated compared to her baseline.  PAST MEDICAL HISTORY:   Past Medical History:  Diagnosis Date   Anxiety    Arthritis    Asthma    Asthma      Breast cancer ([place])    Cancer (HCC)    COPD (chronic obstructive pulmonary disease) (HCC)    Enlarged aorta (HCC)    GERD (gastroesophageal reflux disease)    High cholesterol    Hx of skin cancer, basal cell    Hyperlipemia    Hypertension    PUD (peptic ulcer disease)    Thyroid cancer ([place])     PAST SURGICAL HISTORY:   Past Surgical History:  Procedure Laterality Date   APPENDECTOMY     BREAST SURGERY     left mastectomy   CHOLECYSTECTOMY     FRACTURE SURGERY     JOINT REPLACEMENT     MASTECTOMY     REPAIR OF PERFORATED ULCER     REPLACEMENT TOTAL KNEE BILATERAL     STOMACH SURGERY     THYROIDECTOMY      SOCIAL HISTORY:   Social History   Tobacco Use   Smoking status: Never Smoker   Smokeless tobacco: Never Used  Substance Use Topics   Alcohol use: Yes    Alcohol/week: 1.0 standard drinks    Types: 1 Cans of beer per week    Comment: occasional beer, once a year    FAMILY HISTORY:   Family History  Problem Relation Age of Onset   Hypertension Father    Colon cancer Father    Stroke Mother    Bone cancer Son    Throat cancer Child     DRUG ALLERGIES:  No Known Allergies  REVIEW OF SYSTEMS:   Review of Systems  Constitutional: Positive for weight loss. Negative for chills, fever and malaise/fatigue.  HENT: Negative for ear discharge, ear pain, hearing loss and nosebleeds.   Eyes: Negative for blurred vision, double vision and photophobia.  Respiratory: Positive for cough and shortness of breath. Negative for hemoptysis and wheezing.   Cardiovascular: Positive for leg swelling. Negative for chest pain, palpitations and orthopnea.  Gastrointestinal: Negative for abdominal pain, constipation, diarrhea, heartburn, melena, nausea and vomiting.  Genitourinary: Negative for dysuria, frequency and urgency.  Musculoskeletal: Negative for back pain, myalgias and neck pain.  Skin: Negative for rash.  Neurological: Negative  for dizziness, tremors, sensory change, speech change, focal weakness and headaches.  Endo/Heme/Allergies: Does not bruise/bleed easily.  Psychiatric/Behavioral: Negative for depression.    MEDICATIONS AT HOME:   Prior to Admission medications   Medication Sig Start Date End Date Taking? Authorizing Provider  acetaminophen (TYLENOL) 325 MG tablet Take 650 mg by mouth every 6 (six) hours as needed. Taking two tablets every morning.    [provider]  acidophilus (RISAQUAD) CAPS capsule Take 1 capsule by mouth daily.    [provider]  albuterol (PROVENTIL HFA;VENTOLIN HFA) 108 (90 Base) MCG/ACT inhaler Inhale 1-2 puffs into the lungs every 4 (four) hours as needed for wheezing or shortness of breath. 12/27/18   Gouru, Illene Silver, MD  albuterol (PROVENTIL) (5 MG/ML) 0.5% nebulizer solution Take 0.5 mLs (2.5 mg total) by nebulization every 6 (six) hours as needed for wheezing or shortness of breath. Patient not taking: Reported on 01/20/2019 01/20/19   Jerrol Banana., MD  alendronate (FOSAMAX) 70 MG tablet Take 1 tablet (70 mg total) by mouth once a week. Take with a full glass of water on an empty stomach. 08/06/18   Jerrol Banana., MD  atorvastatin (LIPITOR) 10 MG tablet Take 1 tablet (10 mg total) by mouth daily. 08/06/18   Jerrol Banana., MD  calcium carbonate (TUMS - DOSED IN MG ELEMENTAL CALCIUM) 500 MG chewable tablet Chew 2 tablets by mouth daily.     [provider]  cephALEXin (KEFLEX) 500 MG capsule Take 1 capsule (500 mg total) by mouth 3 (three) times daily. 01/19/19   Fritzi Mandes, MD  cholecalciferol (VITAMIN D) 1000 units tablet Take 2,000 Units by mouth daily.    [provider]  collagenase (SANTYL) ointment Apply topically daily. 01/19/19   Fritzi Mandes, MD  diltiazem (DILACOR XR) 120 MG 24 hr capsule Take 1 capsule (120 mg total) by mouth daily. Take 2 capsules by mouth every morning, and 1 capsule every evening. Patient taking  differently: Take 120 mg by mouth as directed. Take 2 capsules by mouth every morning, and 1 capsule every evening. 01/19/19   Fritzi Mandes, MD  Elastic Bandages & Supports ([place]) MISC Apply first thing in the morning and remove at bedtime 01/29/19   Mar Daring, PA-C  enalapril (VASOTEC) 20 MG tablet Take 1 tablet (20 mg total) by mouth daily. 08/06/18   Jerrol Banana., MD  feeding supplement, ENSURE ENLIVE, (ENSURE ENLIVE) LIQD Take 237 mLs by mouth 2 (two) times daily between meals. 11/24/16   Fritzi Mandes, MD  fesoterodine (TOVIAZ) 8 MG TB24 tablet Take 1 tablet (8 mg total) by mouth daily. 09/01/18   McGowan, Larene Beach A, PA-C  Fluticasone-Salmeterol (ADVAIR) 500-50 MCG/DOSE AEPB Inhale 1 puff into the lungs daily.    [provider]  levothyroxine (  SYNTHROID, LEVOTHROID) 150 MCG tablet Take 1 tablet (150 mcg total) by mouth daily before breakfast. 08/06/18   Jerrol Banana., MD  LORazepam (ATIVAN) 0.5 MG tablet Take 0.5 tablets (0.25 mg total) by mouth 3 (three) times daily as needed for anxiety. 08/06/18   Jerrol Banana., MD  mirtazapine (REMERON) 15 MG tablet Take 1 tablet (15 mg total) by mouth at bedtime. 12/31/18   Jerrol Banana., MD  Multiple Vitamin (MULTIVITAMIN WITH MINERALS) TABS tablet Take 1 tablet by mouth daily.    [provider]      VITAL SIGNS:  Blood pressure (!) 108/55, pulse 74, temperature 98.1 F (36.7 C), temperature source Oral, resp. rate 16, height 5' (1.524 m), weight 51 kg, SpO2 100 %.  PHYSICAL EXAMINATION:   Physical Exam  GENERAL:  85 y.o.-year-old very malnourished appearing patient lying in the bed with no acute distress.  EYES: Pupils equal, round, reactive to light and accommodation. No scleral icterus. Extraocular muscles intact.  HEENT: Head atraumatic, normocephalic.  Significant temporal wasting noted.  Oropharynx and nasopharynx clear.  NECK:  Supple, no jugular venous  distention. No thyroid enlargement, no tenderness.  LUNGS: Moving air bilaterally,-bibasilar rhonchi, no wheezing, rales  or crepitation. No use of accessory muscles of respiration.  CARDIOVASCULAR: S1, S2 normal. No  rubs, or gallops.  3/6 systolic murmur is present Status post left-sided mastectomy ABDOMEN: Soft, nontender, nondistended. Bowel sounds present. No organomegaly or mass.  EXTREMITIES: No cyanosis, or clubbing.  2+ bilateral lower extremity edema with erythema of left lower extremity. NEUROLOGIC: Cranial nerves II through XII are intact. Muscle strength 5/5 in all extremities. Sensation intact. Gait not checked.  Global weakness noted PSYCHIATRIC: The patient is alert and oriented x 3.  SKIN: No obvious rash, lesion, or ulcer.         LABORATORY PANEL:   CBC Recent Labs  Lab 01/29/19 1326  WBC 10.7*  HGB 10.7*  HCT 32.8*  PLT 309   ------------------------------------------------------------------------------------------------------------------  Chemistries  Recent Labs  Lab 01/29/19 1326  NA 132*  K 3.9  CL 93*  CO2 29  GLUCOSE 118*  BUN 19  CREATININE 0.58  CALCIUM 9.0  AST 27  ALT 19  ALKPHOS 72  BILITOT 0.5   ------------------------------------------------------------------------------------------------------------------  Cardiac Enzymes No results for input(s): TROPONINI in the last 168 hours. ------------------------------------------------------------------------------------------------------------------  RADIOLOGY:  Dg Chest 2 View  Result Date: 01/29/2019 CLINICAL DATA:  Hypoxia. EXAM: CHEST - 2 VIEW COMPARISON:  Radiographs of January 06, 2019. FINDINGS: Stable cardiomegaly. Atherosclerosis of thoracic aorta is noted. No pneumothorax or pleural effusion is noted. Stable mild left basilar subsegmental atelectasis or infiltrate. Stable right middle lobe opacity is noted. Degenerative changes seen involving both shoulder joints. IMPRESSION:  Stable bilateral lung opacities as described above. Aortic Atherosclerosis (ICD10-I70.0). Electronically Signed   By: Marijo Conception, M.D.   On: 01/29/2019 13:51   Ct Chest Wo Contrast  Result Date: 01/29/2019 CLINICAL DATA:  Hypoxia, shortness of breath, pneumonia. EXAM: CT CHEST WITHOUT CONTRAST TECHNIQUE: Multidetector CT imaging of the chest was performed following the standard protocol without IV contrast. COMPARISON:  Chest x-ray today and prior chest x-ray on 01/06/2019 FINDINGS: Cardiovascular: The heart is mildly enlarged. There are calcifications associated with the mitral and aortic valves. The ascending thoracic aorta is dilated and measures 4.5 cm in estimated maximal diameter. No pericardial fluid identified. Central pulmonary arteries are normal in caliber. Calcified plaque likely present in the distribution of the LAD.  Mediastinum/Nodes: No enlarged mediastinal, hilar or axillary lymph nodes identified. The trachea is normally patent. Lungs/Pleura: There is dense atelectasis and consolidation of the right middle lobe with air bronchograms and bronchiectasis present. Consolidation of the posterior left lower lobe also present demonstrating bronchiectasis. Bronchiectasis present in the posterior right lower lobe with several distal bronchi demonstrating mucous plugging. No evidence of pulmonary edema, pleural effusions or pneumothorax. No focal nodules detected. Upper Abdomen: No acute abnormality. Musculoskeletal: No chest wall mass or suspicious bone lesions identified. IMPRESSION: 1. Dense atelectasis and consolidation of the right middle lobe and posterior left lower lobe demonstrating air bronchograms as well as bronchiectasis. Bronchiectasis also present in the posterior right lower lobe with evidence of distal mucus plugging. 2. Aneurysmal disease of the ascending thoracic aorta measuring 4.5 cm in estimated maximal diameter. This may be on the basis of aortic valvular disease given  evidence of valvular calcification. Ascending thoracic aortic aneurysm. Recommend semi-annual imaging followup by CTA or MRA and referral to cardiothoracic surgery if not already obtained. This recommendation follows 2010 ACCF/AHA/AATS/ACR/ASA/SCA/SCAI/SIR/STS/SVM Guidelines for the Diagnosis and Management of Patients With Thoracic Aortic Disease. Circulation. 2010; 121: Z563-O756. Aortic aneurysm NOS (ICD10-I71.9) 3. Cardiac enlargement and probable coronary atherosclerosis involving the LAD. Aortic aneurysm NOS (ICD10-I71.9). Electronically Signed   By: Aletta Edouard M.D.   On: 01/29/2019 15:27    EKG:   Orders placed or performed during the hospital encounter of 01/29/19   ED EKG   ED EKG   EKG 12-Lead   EKG 12-Lead    IMPRESSION AND PLAN:   Junnie Loschiavo  is a 85 y.o. female with a known history of breast cancer status post left mastectomy, COPD not on home oxygen, GERD, hypertension, peptic ulcer disease, history of thyroid cancer status post subtotal thyroidectomy presents to hospital from home secondary to lower extremity swelling, recurrent falls and worsening cough and congestion.  1. Right sided recurrent pneumonia- concern for aspiration pneumonia - f/u blood cultures, speech consult - started unasyn  2.  Left lower extremity cellulitis-has recurrent cellulitis due to venous insufficiency and lower extremity edema. -Currently on Unasyn.  Follow-up blood cultures. -Lower extremity Dopplers to rule out DVT  3.  Hypertension-on enalapril and Cardizem  4.  Severe malnutrition-significant temporal wasting and weight loss noted.  Patient states her appetite has recently improved.  Continue Remeron -Palliative care consult for goals of care conversation and recurrent admissions  5.  COPD-stable.  Continue inhalers and nebulizers as needed  6.  DVT prophylaxis-on Lovenox  Physical Therapy consult    All the records are reviewed and case discussed with ED  provider. Management plans discussed with the patient, family and they are in agreement.  CODE STATUS: Full Code  TOTAL TIME TAKING CARE OF THIS PATIENT: 52 minutes.    Gladstone Lighter M.D on 01/29/2019 at 4:10 PM  Between 7am to 6pm - Pager - [phone]  After 6pm go to [email or web address] - password [email or web address] [facility]  Office  [phone]  CC: Primary care physician; Jerrol Banana., MD

## 2019-01-29 NOTE — ED Notes (Signed)
Daughter called for update and reported pt has had three unwitnessed falls in the last two days. This information relayed to MD Alfred Levins

## 2019-01-29 NOTE — Telephone Encounter (Signed)
Joyce [name] is calling back office to inform PA/MD that patient has had recurrent falls. 2 falls occurred Wednesday and 1 yesterday, she states that patient is not on her fluid pill and is having swelling in her feet. She states that listening to patients lungs they sound "wet" and she states that patients oximetry level will not go above 84%. She states that she is very concerned about patient and is at the home present with her now and is wanting PA/MD to give her a call back to advise on what she needs to do? Please review chart and advise, she is aware that Dr. Rosanna Randy is out of office. KW

## 2019-01-29 NOTE — Telephone Encounter (Signed)
Sent in electronically. May need written Rx if pharmacy does not accept.

## 2019-01-29 NOTE — ED Notes (Signed)
Daughter requested to be called and updated once information received.

## 2019-01-29 NOTE — ED Provider Notes (Signed)
[facility] Emergency Department Provider Note  ____________________________________________  Time seen: Approximately 2:16 PM  I have reviewed the triage vital signs and the nursing notes.   HISTORY  Chief Complaint Shortness of Breath   HPI Joyce [name] is a 85 y.o. female with a history of COPD, hypertension, hyperlipidemia, asthma who presents for evaluation of cellulitis and cough.  Patient reports that she was visited by home health nurse today.  She felt like she was doing well however after nurse left she sent EMS to the house to bring patient to the hospital.  Patient is not really sure why she is here.  She has had 2 recent admissions 1 in February for pneumonia and 1 in March for cellulitis in the right lower extremity.  She is noticed since yesterday redness, tenderness, warmth of the left lower extremity which was addressed by the home health nurse.  Patient reports that her breathing is currently at baseline and she does not feel any more short of breath than normal.  She does have a productive cough which is worse when compared to her baseline cough.  She denies fever or chills, she denies chest pain.  She is not on oxygen at baseline.  Past Medical History:  Diagnosis Date   Anxiety    Arthritis    Asthma    Asthma    Breast cancer ([place])    Cancer ([place])    COPD (chronic obstructive pulmonary disease) ([place])    Enlarged aorta (H[facility])    GERD (gastroesophageal reflux disease)    High cholesterol    Hx of skin cancer, basal cell    Hyperlipemia    Hypertension    PUD (peptic ulcer disease)    Thyroid cancer [facility])     Patient Active Problem List   Diagnosis Date Noted   Cellulitis of right leg 01/17/2019   Cellulitis 01/17/2019   Pressure injury of skin 01/17/2019   Multifocal pneumonia 12/24/2018   GERD (gastroesophageal reflux disease) 12/24/2018   COPD with acute exacerbation ([place]) 12/24/2018   Acute  respiratory failure with hypoxia and hypercapnia ([place]) 12/24/2018   Other nonrheumatic aortic valve disorders 10/14/2017   Do not resuscitate status 08/27/2017   Personal history of malignant neoplasm of breast 08/27/2017   Chronic venous hypertension (idiopathic) without complications of bilateral lower extremity 07/25/2017   Tachycardia, unspecified 02/25/2017   Hyponatremia 11/22/2016   Osteoporosis 09/24/2016   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   Hematuria 07/02/2016   Mass of hand 06/04/2016   Other specified soft tissue disorders 06/04/2016   Ascending aorta dilatation ([place]) 05/15/2016   Generalized anxiety disorder 10/24/2015   Postoperative hypothyroidism 12/15/2013   Papillary carcinoma of thyroid ([place]) 11/26/2013   Primary hyperparathyroidism ([place]) 04/01/2013   Osteoarthritis of knee 03/23/2013   Nephrolithiasis 02/03/2013   Malignant neoplasm of female breast ([place]) 11/28/2009   Asthma 02/24/2009   Bronchiectasis without acute exacerbation ([place]) 02/24/2009   Arthropathy of hand 06/16/2008   Hyperlipidemia 11/16/2007   Essential hypertension 11/16/2007    Past Surgical History:  Procedure Laterality Date   APPENDECTOMY     BREAST SURGERY     CHOLECYSTECTOMY     FRACTURE SURGERY     JOINT REPLACEMENT     MASTECTOMY     REPAIR OF PERFORATED ULCER     REPLACEMENT TOTAL KNEE BILATERAL     STOMACH SURGERY     THYROIDECTOMY      Prior to Admission medications  Medication Sig Start Date End Date Taking? Authorizing Provider  acetaminophen (TYLENOL) 325 MG tablet Take 650 mg by mouth every 6 (six) hours as needed. Taking two tablets every morning.    [provider]  acidophilus (RISAQUAD) CAPS capsule Take 1 capsule by mouth daily.    [provider]  albuterol (PROVENTIL HFA;VENTOLIN HFA) 108 (90 Base) MCG/ACT inhaler Inhale 1-2 puffs into the lungs every 4 (four) hours as needed for wheezing or  shortness of breath. 12/27/18   Gouru, Illene Silver, MD  albuterol (PROVENTIL) (5 MG/ML) 0.5% nebulizer solution Take 0.5 mLs (2.5 mg total) by nebulization every 6 (six) hours as needed for wheezing or shortness of breath. Patient not taking: Reported on 01/20/2019 01/20/19   Jerrol Banana., MD  alendronate (FOSAMAX) 70 MG tablet Take 1 tablet (70 mg total) by mouth once a week. Take with a full glass of water on an empty stomach. 08/06/18   Jerrol Banana., MD  atorvastatin (LIPITOR) 10 MG tablet Take 1 tablet (10 mg total) by mouth daily. 08/06/18   Jerrol Banana., MD  calcium carbonate (TUMS - DOSED IN MG ELEMENTAL CALCIUM) 500 MG chewable tablet Chew 2 tablets by mouth daily.     [provider]  cephALEXin (KEFLEX) 500 MG capsule Take 1 capsule (500 mg total) by mouth 3 (three) times daily. 01/19/19   Fritzi Mandes, MD  cholecalciferol (VITAMIN D) 1000 units tablet Take 2,000 Units by mouth daily.    [provider]  collagenase (SANTYL) ointment Apply topically daily. 01/19/19   Fritzi Mandes, MD  diltiazem (DILACOR XR) 120 MG 24 hr capsule Take 1 capsule (120 mg total) by mouth daily. Take 2 capsules by mouth every morning, and 1 capsule every evening. Patient taking differently: Take 120 mg by mouth as directed. Take 2 capsules by mouth every morning, and 1 capsule every evening. 01/19/19   Fritzi Mandes, MD  Elastic Bandages & Supports ([place]) MISC Apply first thing in the morning and remove at bedtime 01/29/19   Mar Daring, PA-C  enalapril (VASOTEC) 20 MG tablet Take 1 tablet (20 mg total) by mouth daily. 08/06/18   Jerrol Banana., MD  feeding supplement, ENSURE ENLIVE, (ENSURE ENLIVE) LIQD Take 237 mLs by mouth 2 (two) times daily between meals. 11/24/16   Fritzi Mandes, MD  fesoterodine (TOVIAZ) 8 MG TB24 tablet Take 1 tablet (8 mg total) by mouth daily. 09/01/18   McGowan, Larene Beach A, PA-C  Fluticasone-Salmeterol (ADVAIR) 500-50  MCG/DOSE AEPB Inhale 1 puff into the lungs daily.    [provider]  levothyroxine (SYNTHROID, LEVOTHROID) 150 MCG tablet Take 1 tablet (150 mcg total) by mouth daily before breakfast. 08/06/18   Jerrol Banana., MD  LORazepam (ATIVAN) 0.5 MG tablet Take 0.5 tablets (0.25 mg total) by mouth 3 (three) times daily as needed for anxiety. 08/06/18   Jerrol Banana., MD  mirtazapine (REMERON) 15 MG tablet Take 1 tablet (15 mg total) by mouth at bedtime. 12/31/18   Jerrol Banana., MD  Multiple Vitamin (MULTIVITAMIN WITH MINERALS) TABS tablet Take 1 tablet by mouth daily.    [provider]    Allergies Patient has no known allergies.  Family History  Problem Relation Age of Onset   Hypertension Father    Colon cancer Father    Stroke Mother     Social History Social History   Tobacco Use   Smoking status: Never Smoker  Smokeless tobacco: Never Used  Substance Use Topics   Alcohol use: Yes    Alcohol/week: 1.0 standard drinks    Types: 1 Cans of beer per week    Comment: occasional beer, once a year   Drug use: No    Review of Systems  Constitutional: Negative for fever. Eyes: Negative for visual changes. ENT: Negative for sore throat. Neck: No neck pain  Cardiovascular: Negative for chest pain. Respiratory: Negative for shortness of breath. + cough Gastrointestinal: Negative for abdominal pain, vomiting or diarrhea. Genitourinary: Negative for dysuria. Musculoskeletal: Negative for back pain. Skin: Negative for rash. + cellulitis of the LLE Neurological: Negative for headaches, weakness or numbness. Psych: No SI or HI  ____________________________________________   PHYSICAL EXAM:  VITAL SIGNS: ED Triage Vitals  Enc Vitals Group     BP --      Pulse Rate 01/29/19 1315 88     Resp 01/29/19 1313 20     Temp 01/29/19 1313 98.1 F (36.7 C)     Temp Source 01/29/19 1313 Oral     SpO2 01/29/19 1315 99 %     Weight 01/29/19  1311 112 lb 7 oz (51 kg)     Height 01/29/19 1311 5' (1.524 m)     Head Circumference --      Peak Flow --      Pain Score 01/29/19 1311 0     Pain Loc --      Pain Edu? --      Excl. in [place]? --     Constitutional: Alert and oriented. Well appearing and in no apparent distress. HEENT:      Head: Normocephalic and atraumatic.         Eyes: Conjunctivae are normal. Sclera is non-icteric.       Mouth/Throat: Mucous membranes are moist.       Neck: Supple with no signs of meningismus. Cardiovascular: Regular rate and rhythm. No murmurs, gallops, or rubs. 2+ symmetrical distal pulses are present in all extremities. No JVD. Respiratory: Tachypneic with no hypoxia, increased WOB using accessory muscles of respiratrion Gastrointestinal: Soft, non tender, and non distended with positive bowel sounds. No rebound or guarding. Musculoskeletal: There is erythema, warmth and mild tenderness diffusely on the left lower extremity with no crepitus, no hemorrhagic bullae, no necrosis  neurologic: Normal speech and language. Face is symmetric. Moving all extremities. No gross focal neurologic deficits are appreciated. Skin: Skin is warm, dry and intact. No rash noted. Psychiatric: Mood and affect are normal. Speech and behavior are normal.  ____________________________________________   LABS (all labs ordered are listed, but only abnormal results are displayed)  Labs Reviewed  COMPREHENSIVE METABOLIC PANEL - Abnormal; Notable for the following components:      Result Value   Sodium 132 (*)    Chloride 93 (*)    Glucose, Bld 118 (*)    Albumin 3.4 (*)    All other components within normal limits  CBC WITH DIFFERENTIAL/PLATELET - Abnormal; Notable for the following components:   WBC 10.7 (*)    RBC 3.62 (*)    Hemoglobin 10.7 (*)    HCT 32.8 (*)    Abs Immature Granulocytes 0.09 (*)    All other components within normal limits  LACTIC ACID, PLASMA  LACTIC ACID, PLASMA  URINALYSIS, COMPLETE  (UACMP) WITH MICROSCOPIC   ____________________________________________  EKG  ED ECG REPORT I, Rudene Re, the attending physician, personally viewed and interpreted this ECG.  Normal sinus rhythm, rate of  87, normal intervals, right axis deviation, no ST elevations or depressions. Unchanged from prior ____________________________________________  RADIOLOGY  I have personally reviewed the images performed during this visit and I agree with the Radiologist's read.   Interpretation by Radiologist:  Dg Chest 2 View  Result Date: 01/29/2019 CLINICAL DATA:  Hypoxia. EXAM: CHEST - 2 VIEW COMPARISON:  Radiographs of January 06, 2019. FINDINGS: Stable cardiomegaly. Atherosclerosis of thoracic aorta is noted. No pneumothorax or pleural effusion is noted. Stable mild left basilar subsegmental atelectasis or infiltrate. Stable right middle lobe opacity is noted. Degenerative changes seen involving both shoulder joints. IMPRESSION: Stable bilateral lung opacities as described above. Aortic Atherosclerosis (ICD10-I70.0). Electronically Signed   By: Marijo Conception, M.D.   On: 01/29/2019 13:51   Ct Chest Wo Contrast  Result Date: 01/29/2019 CLINICAL DATA:  Hypoxia, shortness of breath, pneumonia. EXAM: CT CHEST WITHOUT CONTRAST TECHNIQUE: Multidetector CT imaging of the chest was performed following the standard protocol without IV contrast. COMPARISON:  Chest x-ray today and prior chest x-ray on 01/06/2019 FINDINGS: Cardiovascular: The heart is mildly enlarged. There are calcifications associated with the mitral and aortic valves. The ascending thoracic aorta is dilated and measures 4.5 cm in estimated maximal diameter. No pericardial fluid identified. Central pulmonary arteries are normal in caliber. Calcified plaque likely present in the distribution of the LAD. Mediastinum/Nodes: No enlarged mediastinal, hilar or axillary lymph nodes identified. The trachea is normally patent. Lungs/Pleura: There  is dense atelectasis and consolidation of the right middle lobe with air bronchograms and bronchiectasis present. Consolidation of the posterior left lower lobe also present demonstrating bronchiectasis. Bronchiectasis present in the posterior right lower lobe with several distal bronchi demonstrating mucous plugging. No evidence of pulmonary edema, pleural effusions or pneumothorax. No focal nodules detected. Upper Abdomen: No acute abnormality. Musculoskeletal: No chest wall mass or suspicious bone lesions identified. IMPRESSION: 1. Dense atelectasis and consolidation of the right middle lobe and posterior left lower lobe demonstrating air bronchograms as well as bronchiectasis. Bronchiectasis also present in the posterior right lower lobe with evidence of distal mucus plugging. 2. Aneurysmal disease of the ascending thoracic aorta measuring 4.5 cm in estimated maximal diameter. This may be on the basis of aortic valvular disease given evidence of valvular calcification. Ascending thoracic aortic aneurysm. Recommend semi-annual imaging followup by CTA or MRA and referral to cardiothoracic surgery if not already obtained. This recommendation follows 2010 ACCF/AHA/AATS/ACR/ASA/SCA/SCAI/SIR/STS/SVM Guidelines for the Diagnosis and Management of Patients With Thoracic Aortic Disease. Circulation. 2010; 121: J570-V779. Aortic aneurysm NOS (ICD10-I71.9) 3. Cardiac enlargement and probable coronary atherosclerosis involving the LAD. Aortic aneurysm NOS (ICD10-I71.9). Electronically Signed   By: Aletta Edouard M.D.   On: 01/29/2019 15:27     ____________________________________________   PROCEDURES  Procedure(s) performed: None Procedures Critical Care performed:  None ____________________________________________   INITIAL IMPRESSION / ASSESSMENT AND PLAN / ED COURSE  85 y.o. female with a history of COPD, hypertension, hyperlipidemia, asthma who presents for evaluation of cellulitis and cough. Patient  with increased work of breathing but no hypoxia.  Chest x-ray initially unchanged from prior therefore patient was sent for a CT of the chest which shows a right-sided lobar pneumonia.  She also has new cellulitis of the left lower extremity.  No signs of sepsis.  Patient was given Rocephin and vancomycin, duoneb and solumedrol for COPD exacerbation. Will admit to Hospitalist      As part of my medical decision making, I reviewed the following data within the [place]  Nursing notes reviewed and incorporated, Labs reviewed , EKG interpreted , Old EKG reviewed, Old chart reviewed, Radiograph reviewed , Discussed with admitting physician , Notes from prior ED visits and [place] Controlled Substance Database    Pertinent labs & imaging results that were available during my care of the patient were reviewed by me and considered in my medical decision making (see chart for details).    ____________________________________________   FINAL CLINICAL IMPRESSION(S) / ED DIAGNOSES  Final diagnoses:  Pneumonia of both lungs due to infectious organism, unspecified part of lung  Cellulitis of left lower extremity      NEW MEDICATIONS STARTED DURING THIS VISIT:  ED Discharge Orders    None       Note:  This document was prepared using Dragon voice recognition software and may include unintentional dictation errors.    Alfred Levins, [place], MD 01/29/19 [phone]

## 2019-01-29 NOTE — Telephone Encounter (Signed)
NURSE WAS ADVISED. kw

## 2019-01-29 NOTE — Telephone Encounter (Signed)
Joyce [name] is calling back again wanting to know about order requested below for compression stockings? I advised her that Dr. Rosanna Randy is out of office today she states that she needs to know answer today about order. Please review chart and advise. KW

## 2019-01-29 NOTE — Telephone Encounter (Signed)
Joyce [name] with home health care advised as directed below. Reports that O2 went up to 94% after patient cleared up some phlegm but told her it was still necessary for patient to go to the ED for possible IV lasix.

## 2019-01-29 NOTE — Consult Note (Signed)
Pharmacy Antibiotic Note  Joyce [name] is a 85 y.o. female admitted on 01/29/2019 with aspiration pneumonia.  Pharmacy has been consulted for Unasyn dosing.  Plan: Unasyn 3g q8h (borderline CrCl ~ 30, ok to adjust to q8h dosing per micromedex)  Height: 5' (152.4 cm) Weight: 112 lb 7 oz (51 kg) IBW/kg (Calculated) : 45.5  Temp (24hrs), Avg:98.1 F (36.7 C), Min:98.1 F (36.7 C), Max:98.1 F (36.7 C)  Recent Labs  Lab 01/29/19 1326  WBC 10.7*  CREATININE 0.58  LATICACIDVEN 1.7    Estimated Creatinine Clearance: 34.9 mL/min (by C-G formula based on SCr of 0.58 mg/dL).    No Known Allergies  Antimicrobials this admission: Vancomycin 3/27 x 1 Ceftriaxone 3/27 x 1 Unasyn 3/27 >>  Dose adjustments this admission: N/A  Microbiology results: None pending  Thank you for allowing pharmacy to be a part of this patient's care.  Lu Duffel, PharmD, BCPS Clinical Pharmacist 01/29/2019 4:11 PM

## 2019-01-29 NOTE — ED Notes (Signed)
Patient transported to CT 

## 2019-01-29 NOTE — Telephone Encounter (Signed)
She needs to go to ER, preferably call 911 due to low O2. Possible CHF exacerbation

## 2019-01-30 ENCOUNTER — Inpatient Hospital Stay: Payer: Medicare Other

## 2019-01-30 LAB — BASIC METABOLIC PANEL WITH GFR
Anion gap: 6 (ref 5–15)
BUN: 17 mg/dL (ref 8–23)
CO2: 27 mmol/L (ref 22–32)
Calcium: 7.7 mg/dL — ABNORMAL LOW (ref 8.9–10.3)
Chloride: 99 mmol/L (ref 98–111)
Creatinine, Ser: 0.45 mg/dL (ref 0.44–1.00)
GFR calc Af Amer: >60 mL/min
GFR calc non Af Amer: >60 mL/min
Glucose, Bld: 156 mg/dL — ABNORMAL HIGH (ref 70–99)
Potassium: 3.8 mmol/L (ref 3.5–5.1)
Sodium: 132 mmol/L — ABNORMAL LOW (ref 135–145)

## 2019-01-30 LAB — CBC
HCT: 31.3 % — ABNORMAL LOW (ref 36.0–46.0)
Hemoglobin: 10.1 g/dL — ABNORMAL LOW (ref 12.0–15.0)
MCH: 29.3 pg (ref 26.0–34.0)
MCHC: 32.3 g/dL (ref 30.0–36.0)
MCV: 90.7 fL (ref 80.0–100.0)
Platelets: 260 10*3/uL (ref 150–400)
RBC: 3.45 MIL/uL — ABNORMAL LOW (ref 3.87–5.11)
RDW: 14.7 % (ref 11.5–15.5)
WBC: 5.7 10*3/uL (ref 4.0–10.5)
nRBC: 0 % (ref 0.0–0.2)

## 2019-01-30 MED ORDER — POLYVINYL ALCOHOL 1.4 % OP SOLN
1.0000 [drp] | OPHTHALMIC | Status: DC | PRN
  Filled 2019-01-30: qty 15

## 2019-01-30 NOTE — Progress Notes (Signed)
Chart reviewed, Pt is out for ultrasounds. Will reattempt assessment when Pt returns.

## 2019-01-30 NOTE — Progress Notes (Signed)
Physical Therapy Evaluation Patient Details Name: Joyce [name] MRN: [id number] DOB: 1928/07/11 Today's Date: 01/30/2019   History of Present Illness  Patient is 85 yr old female with admission to hospital . Patient was noted to be in labored breathing here, initially hypoxic but oxygen saturations have improved.  CT chest showing worsening right-sided pneumonia with new consolidation in the right middle lobe.  Also has left lower extremity cellulitis.  Wbc elevated compared to her baseline.  Clinical Impression  Patient is able to complete bed mobility and transfers with min assist. She has fwd head and kyphosis . She ambulated 100 feet with RW and CGA with reports of BLE weakness, and decreased gait speed. She has 3/5 strength BLE hip and knee strength. She will continue to benefit from skilled PT to improve mobility and strength.     Follow Up Recommendations Home health PT    Equipment Recommendations  Rolling walker with 5" wheels    Recommendations for Other Services       Precautions / Restrictions Precautions Precautions: Fall Restrictions Weight Bearing Restrictions: No      Mobility  Bed Mobility Overal bed mobility: Needs Assistance Bed Mobility: Supine to Sit;Sit to Supine     Supine to sit: Min assist Sit to supine: Min guard   General bed mobility comments: Patient needs min assist   Transfers                    Ambulation/Gait Ambulation/Gait assistance: Modified independent (Device/Increase time);Supervision Gait Distance (Feet): 100 Feet Assistive device: Rolling walker (2 wheeled) Gait Pattern/deviations: Step-to pattern     General Gait Details: (decreased gait speed)  Stairs            Wheelchair Mobility    Modified Rankin (Stroke Patients Only)       Balance Overall balance assessment: Mild deficits observed, not formally tested                                           Pertinent Vitals/Pain Pain  Assessment: No/denies pain    Home Living Family/patient expects to be discharged to:: Private residence Living Arrangements: Children Available Help at Discharge: Family Type of Home: House Home Access: Stairs to enter Entrance Stairs-Rails: Right Entrance Stairs-Number of Steps: (3) Home Layout: One level Home Equipment: Walker - 2 wheels;Shower seat;Grab bars - tub/shower      Prior Function Level of Independence: Independent with assistive device(s)               Hand Dominance        Extremity/Trunk Assessment   Upper Extremity Assessment Upper Extremity Assessment: Overall WFL for tasks assessed    Lower Extremity Assessment Lower Extremity Assessment: Overall WFL for tasks assessed       Communication   Communication: No difficulties  Cognition Arousal/Alertness: Awake/alert   Overall Cognitive Status: Within Functional Limits for tasks assessed                                        General Comments      Exercises     Assessment/Plan    PT Assessment Patient needs continued PT services  PT Problem List Decreased strength;Decreased activity tolerance;Decreased balance       PT Treatment Interventions Gait training;Therapeutic  activities;Therapeutic exercise;Balance training    PT Goals (Current goals can be found in the Care Plan section)  Acute Rehab PT Goals Patient Stated Goal: (to walk better and get stronger) PT Goal Formulation: With patient Time For Goal Achievement: 02/13/19 Potential to Achieve Goals: Good    Frequency Min 2X/week   Barriers to discharge        Co-evaluation               AM-PAC PT "6 Clicks" Mobility  Outcome Measure Help needed turning from your back to your side while in a flat bed without using bedrails?: None Help needed moving from lying on your back to sitting on the side of a flat bed without using bedrails?: A Little Help needed moving to and from a bed to a chair (including  a wheelchair)?: A Little Help needed standing up from a chair using your arms (e.g., wheelchair or bedside chair)?: A Little Help needed to walk in hospital room?: A Little Help needed climbing 3-5 steps with a railing? : A Little 6 Click Score: 19    End of Session Equipment Utilized During Treatment: Gait belt Activity Tolerance: Patient tolerated treatment well Patient left: in bed;with nursing/sitter in room Nurse Communication: Mobility status PT Visit Diagnosis: Muscle weakness (generalized) (M62.81);Difficulty in walking, not elsewhere classified (R26.2)    Time: 0930-1010 PT Time Calculation (min) (ACUTE ONLY): 40 min   Charges:   PT Evaluation $PT Eval Low Complexity: 1 Low PT Treatments $Gait Training: 8-22 mins $Therapeutic Activity: 8-22 mins          Arelia Sneddon S, PT DPT 01/30/2019, 10:45 AM

## 2019-01-30 NOTE — Progress Notes (Signed)
[facility] [place] NAME: Joyce [name]    MR#:  [id number]  DATE OF BIRTH:  03-22-1928  SUBJECTIVE:  CHIEF COMPLAINT:   Chief Complaint  Patient presents with  . Shortness of Breath  Patient without complaint, case discussed with nursing staff, mild hypotension noted-lisinopril was discontinued, speech therapy to see, physical therapy input appreciated, lower extremity Dopplers and abdominal ultrasound was noted  REVIEW OF SYSTEMS:  CONSTITUTIONAL: No fever, fatigue or weakness.  EYES: No blurred or double vision.  EARS, NOSE, AND THROAT: No tinnitus or ear pain.  RESPIRATORY: No cough, shortness of breath, wheezing or hemoptysis.  CARDIOVASCULAR: No chest pain, orthopnea, edema.  GASTROINTESTINAL: No nausea, vomiting, diarrhea or abdominal pain.  GENITOURINARY: No dysuria, hematuria.  ENDOCRINE: No polyuria, nocturia,  HEMATOLOGY: No anemia, easy bruising or bleeding SKIN: No rash or lesion. MUSCULOSKELETAL: No joint pain or arthritis.   NEUROLOGIC: No tingling, numbness, weakness.  PSYCHIATRY: No anxiety or depression.   ROS  DRUG ALLERGIES:  No Known Allergies  VITALS:  Blood pressure (!) 145/84, pulse (!) 111, temperature 98.2 F (36.8 C), temperature source Oral, resp. rate 16, height 5' (1.524 m), weight 51 kg, SpO2 99 %.  PHYSICAL EXAMINATION:  GENERAL:  85 y.o.-year-old patient lying in the bed with no acute distress.  EYES: Pupils equal, round, reactive to light and accommodation. No scleral icterus. Extraocular muscles intact.  HEENT: Head atraumatic, normocephalic. Oropharynx and nasopharynx clear.  NECK:  Supple, no jugular venous distention. No thyroid enlargement, no tenderness.  LUNGS: Normal breath sounds bilaterally, no wheezing, rales,rhonchi or crepitation. No use of accessory muscles of respiration.  CARDIOVASCULAR: S1, S2 normal. No murmurs, rubs, or gallops.  ABDOMEN: Soft, nontender, nondistended. Bowel  sounds present. No organomegaly or mass.  EXTREMITIES: No pedal edema, cyanosis, or clubbing.  NEUROLOGIC: Cranial nerves II through XII are intact. Muscle strength 5/5 in all extremities. Sensation intact. Gait not checked.  PSYCHIATRIC: The patient is alert and oriented x 3.  SKIN: No obvious rash, lesion, or ulcer.   Physical Exam LABORATORY PANEL:   CBC Recent Labs  Lab 01/30/19 0548  WBC 5.7  HGB 10.1*  HCT 31.3*  PLT 260   ------------------------------------------------------------------------------------------------------------------  Chemistries  Recent Labs  Lab 01/29/19 1326 01/30/19 0548  NA 132* 132*  K 3.9 3.8  CL 93* 99  CO2 29 27  GLUCOSE 118* 156*  BUN 19 17  CREATININE 0.58 0.45  CALCIUM 9.0 7.7*  AST 27  --   ALT 19  --   ALKPHOS 72  --   BILITOT 0.5  --    ------------------------------------------------------------------------------------------------------------------  Cardiac Enzymes No results for input(s): TROPONINI in the last 168 hours. ------------------------------------------------------------------------------------------------------------------  RADIOLOGY:  Dg Chest 2 View  Result Date: 01/29/2019 CLINICAL DATA:  Hypoxia. EXAM: CHEST - 2 VIEW COMPARISON:  Radiographs of January 06, 2019. FINDINGS: Stable cardiomegaly. Atherosclerosis of thoracic aorta is noted. No pneumothorax or pleural effusion is noted. Stable mild left basilar subsegmental atelectasis or infiltrate. Stable right middle lobe opacity is noted. Degenerative changes seen involving both shoulder joints. IMPRESSION: Stable bilateral lung opacities as described above. Aortic Atherosclerosis (ICD10-I70.0). Electronically Signed   By: Marijo Conception, M.D.   On: 01/29/2019 13:51   Ct Chest Wo Contrast  Result Date: 01/29/2019 CLINICAL DATA:  Hypoxia, shortness of breath, pneumonia. EXAM: CT CHEST WITHOUT CONTRAST TECHNIQUE: Multidetector CT imaging of the chest was performed  following the standard protocol without IV contrast. COMPARISON:  Chest x-ray today and prior chest x-ray on 01/06/2019 FINDINGS: Cardiovascular: The heart is mildly enlarged. There are calcifications associated with the mitral and aortic valves. The ascending thoracic aorta is dilated and measures 4.5 cm in estimated maximal diameter. No pericardial fluid identified. Central pulmonary arteries are normal in caliber. Calcified plaque likely present in the distribution of the LAD. Mediastinum/Nodes: No enlarged mediastinal, hilar or axillary lymph nodes identified. The trachea is normally patent. Lungs/Pleura: There is dense atelectasis and consolidation of the right middle lobe with air bronchograms and bronchiectasis present. Consolidation of the posterior left lower lobe also present demonstrating bronchiectasis. Bronchiectasis present in the posterior right lower lobe with several distal bronchi demonstrating mucous plugging. No evidence of pulmonary edema, pleural effusions or pneumothorax. No focal nodules detected. Upper Abdomen: No acute abnormality. Musculoskeletal: No chest wall mass or suspicious bone lesions identified. IMPRESSION: 1. Dense atelectasis and consolidation of the right middle lobe and posterior left lower lobe demonstrating air bronchograms as well as bronchiectasis. Bronchiectasis also present in the posterior right lower lobe with evidence of distal mucus plugging. 2. Aneurysmal disease of the ascending thoracic aorta measuring 4.5 cm in estimated maximal diameter. This may be on the basis of aortic valvular disease given evidence of valvular calcification. Ascending thoracic aortic aneurysm. Recommend semi-annual imaging followup by CTA or MRA and referral to cardiothoracic surgery if not already obtained. This recommendation follows 2010 ACCF/AHA/AATS/ACR/ASA/SCA/SCAI/SIR/STS/SVM Guidelines for the Diagnosis and Management of Patients With Thoracic Aortic Disease. Circulation. 2010; 121:  Z124-P809. Aortic aneurysm NOS (ICD10-I71.9) 3. Cardiac enlargement and probable coronary atherosclerosis involving the LAD. Aortic aneurysm NOS (ICD10-I71.9). Electronically Signed   By: Aletta Edouard M.D.   On: 01/29/2019 15:27   US Abdomen Limited  Result Date: 01/30/2019 CLINICAL DATA:  Abdominal distension EXAM: LIMITED ABDOMEN ULTRASOUND FOR ASCITES TECHNIQUE: Limited ultrasound survey for ascites was performed in all four abdominal quadrants. COMPARISON:  None. FINDINGS: Survey imaging of all four abdominal quadrants was performed. No evidence of abdominal ascites. IMPRESSION: No evidence abdominal ascites on ultrasound. Electronically Signed   By: Julian Hy M.D.   On: 01/30/2019 11:29   US Venous Img Lower Bilateral  Result Date: 01/30/2019 CLINICAL DATA:  Bilateral lower extremity edema. EXAM: BILATERAL LOWER EXTREMITY VENOUS DOPPLER ULTRASOUND TECHNIQUE: Gray-scale sonography with graded compression, as well as color Doppler and duplex ultrasound were performed to evaluate the lower extremity deep venous systems from the level of the common femoral vein and including the common femoral, femoral, profunda femoral, popliteal and calf veins including the posterior tibial, peroneal and gastrocnemius veins when visible. The superficial great saphenous vein was also interrogated. Spectral Doppler was utilized to evaluate flow at rest and with distal augmentation maneuvers in the common femoral, femoral and popliteal veins. COMPARISON:  None. FINDINGS: RIGHT LOWER EXTREMITY Common Femoral Vein: No evidence of thrombus. Normal compressibility, respiratory phasicity and response to augmentation. Saphenofemoral Junction: No evidence of thrombus. Normal compressibility and flow on color Doppler imaging. Profunda Femoral Vein: No evidence of thrombus. Normal compressibility and flow on color Doppler imaging. Femoral Vein: No evidence of thrombus. Normal compressibility, respiratory phasicity and  response to augmentation. Popliteal Vein: No evidence of thrombus. Normal compressibility, respiratory phasicity and response to augmentation. Calf Veins: No evidence of thrombus. Normal compressibility and flow on color Doppler imaging. Superficial Great Saphenous Vein: No evidence of thrombus. Normal compressibility. Venous Reflux:  None. Other Findings: No evidence of superficial thrombophlebitis or abnormal fluid collection. LEFT LOWER EXTREMITY Common Femoral Vein: No evidence of  thrombus. Normal compressibility, respiratory phasicity and response to augmentation. Saphenofemoral Junction: No evidence of thrombus. Normal compressibility and flow on color Doppler imaging. Profunda Femoral Vein: No evidence of thrombus. Normal compressibility and flow on color Doppler imaging. Femoral Vein: No evidence of thrombus. Normal compressibility, respiratory phasicity and response to augmentation. Popliteal Vein: No evidence of thrombus. Normal compressibility, respiratory phasicity and response to augmentation. Calf Veins: No evidence of thrombus. Normal compressibility and flow on color Doppler imaging. Superficial Great Saphenous Vein: No evidence of thrombus. Normal compressibility. Venous Reflux:  None. Other Findings: No evidence of superficial thrombophlebitis or abnormal fluid collection. IMPRESSION: No evidence of deep venous thrombosis in either lower extremity. Electronically Signed   By: Aletta Edouard M.D.   On: 01/30/2019 11:42    ASSESSMENT AND PLAN:  Joyce [name]  is a 85 y.o. female with a known history of breast cancer status post left mastectomy, COPD not on home oxygen, GERD, hypertension, peptic ulcer disease, history of thyroid cancer status post subtotal thyroidectomy presents to hospital from home secondary to lower extremity swelling, recurrent falls and worsening cough and congestion.  * Acute Right sided recurrent pneumonia concern for aspiration pneumonia Continue pneumonia protocol,  start Augmentin, follow-up on cultures  *Acute Left lower extremity cellulitis Stable LED neg for DVT Continue Augmentin for 5-7-day course   *Acute hypotension with history of hypertension  Discontinue lisinopril  Continue Cardizem   *Chronic severe protein calorie malnutrition  Continue meal supplementation, physical therapy recommended home health PT status post discharge  Palliative care consult for goals of care conversation and recurrent admissions  *COPD without exacerbation Breathing treatments PRN   DVT prophylaxis-on Lovenox  All the records are reviewed and case discussed with Care Management/Social Workerr. Management plans discussed with the patient, family and they are in agreement.  CODE STATUS: full  TOTAL TIME TAKING CARE OF THIS PATIENT: 35 minutes.     POSSIBLE D/C IN 1-2 DAYS, DEPENDING ON CLINICAL CONDITION.   Avel Peace  M.D on 01/30/2019   Between 7am to 6pm - Pager - [phone]  After 6pm go to [email or web address] - password [email or web address]  Office  [phone]  CC: Primary care physician; Jerrol Banana., MD  Note: This dictation was prepared with Dragon dictation along with smaller phrase technology. Any transcriptional errors that result from this process are unintentional.

## 2019-01-30 NOTE — Progress Notes (Signed)
Spoke with Dr. Jerelyn Charles regarding patient's hypotension.  No new orders; continue to monitor.

## 2019-01-30 NOTE — Evaluation (Signed)
Clinical/Bedside Swallow Evaluation Patient Details  Name: JOHNELL LANDOWSKI MRN: [id number] Date of Birth: 08-18-1928  Today's Date: 01/30/2019 Time: SLP Start Time (ACUTE ONLY): 1225 SLP Stop Time (ACUTE ONLY): 1301 SLP Time Calculation (min) (ACUTE ONLY): 36 min  Past Medical History:  Past Medical History:  Diagnosis Date  . Anxiety   . Arthritis   . Asthma   . Asthma   . Breast cancer ([place])   . Cancer ([place])   . COPD (chronic obstructive pulmonary disease) ([place])   . Enlarged aorta ([place])   . GERD (gastroesophageal reflux disease)   . High cholesterol   . Hx of skin cancer, basal cell   . Hyperlipemia   . Hypertension   . PUD (peptic ulcer disease)   . Thyroid cancer [facility])    Past Surgical History:  Past Surgical History:  Procedure Laterality Date  . APPENDECTOMY    . BREAST SURGERY     left mastectomy  . CHOLECYSTECTOMY    . FRACTURE SURGERY    . JOINT REPLACEMENT    . MASTECTOMY    . REPAIR OF PERFORATED ULCER    . REPLACEMENT TOTAL KNEE BILATERAL    . STOMACH SURGERY    . THYROIDECTOMY     HPI: Per ER MD Notes:  BERNESE DOFFING is a 85 y.o. female with a history of COPD, hypertension, hyperlipidemia, asthma who presents for evaluation of cellulitis and cough.  Patient reports that she was visited by home health nurse today.  She felt like she was doing well however after nurse left she sent EMS to the house to bring patient to the hospital.  Patient is not really sure why she is here.  She has had 2 recent admissions 1 in February for pneumonia and 1 in March for cellulitis in the right lower extremity.  She is noticed since yesterday redness, tenderness, warmth of the left lower extremity which was addressed by the home health nurse.  Patient reports that her breathing is currently at baseline and she does not feel any more short of breath than normal.  She does have a productive cough which is worse when compared to her baseline cough.  She denies fever or chills, she  denies chest pain.  She is not on oxygen at baseline.     Assessment / Plan / Recommendation Clinical Impression  Pt presents with mild dysphagia at bedside but no overt s/s of aspiration. Oral mech exam revealed structures to be functioning adequately. Given sips of thin liquid, Pt presented with an audible swallow likely indicating decreased pharyngeal pressure. Pt was able to masticate small bites of graham cracker but needed extended time to chew and swallow. Min to no oral residue after the swallow. Rec. Dysphagia 2 diet with thin liquids. Meds to be given whole in applesauce. ST to follow up with toleration of diet. Pt noted to have recurrent pneumonia. If ST or MD have further concerns of aspiration with thin (silent), Pt may benefit from Mod barium swallow to further assess. ST to f/u with toleration of diet and proceed as appropriate.  SLP Visit Diagnosis: Dysphagia, oropharyngeal phase (R13.12)    Aspiration Risk  Mild aspiration risk    Diet Recommendation Dysphagia 2 (Fine chop)   Medication Administration: Whole meds with puree Supervision: Staff to assist with self feeding Compensations: Small sips/bites Postural Changes: Seated upright at 90 degrees    Other  Recommendations     Follow up Recommendations  Frequency and Duration min 3x week  1 week       Prognosis Prognosis for Safe Diet Advancement: Good      Swallow Study   General Type of Study: Bedside Swallow Evaluation Diet Prior to this Study: Regular Temperature Spikes Noted: No Respiratory Status: Room air History of Recent Intubation: No Behavior/Cognition: Cooperative;Alert;Pleasant mood Oral Cavity Assessment: Within Functional Limits Oral Care Completed by SLP: No Oral Cavity - Dentition: Adequate natural dentition Vision: Functional for self-feeding Self-Feeding Abilities: Needs set up;Able to feed self Patient Positioning: Upright in bed Baseline Vocal Quality: Normal Volitional Cough:  Strong Volitional Swallow: Able to elicit    Oral/Motor/Sensory Function Overall Oral Motor/Sensory Function: Within functional limits   Ice Chips Ice chips: Within functional limits Presentation: Cup   Thin Liquid Thin Liquid: Within functional limits    Nectar Thick Nectar Thick Liquid: Not tested   Honey Thick Honey Thick Liquid: Not tested   Puree Puree: Within functional limits   Solid     Solid: Impaired Oral Phase Impairments: Impaired mastication Pharyngeal Phase Impairments: Decreased hyoid-laryngeal movement      Lucila Maine 01/30/2019,1:03 PM

## 2019-01-31 MED ORDER — AMOXICILLIN-POT CLAVULANATE 875-125 MG PO TABS: 1.0000 | ORAL_TABLET | Freq: Two times a day (BID) | ORAL | 0 refills | Status: DC

## 2019-01-31 NOTE — Discharge Summary (Signed)
[place] [facility] [place] NAME: Joyce [name]    MR#:  [id number]  DATE OF BIRTH:  06-14-1928  DATE OF ADMISSION:  01/29/2019 ADMITTING PHYSICIAN: Gladstone Lighter, MD  DATE OF DISCHARGE: No discharge date for patient encounter.  PRIMARY CARE PHYSICIAN: Jerrol Banana., MD    ADMISSION DIAGNOSIS:  Swelling [R60.9] Abdominal distension [R14.0] Cellulitis of left lower extremity [J88.416] Pneumonia of both lungs due to infectious organism, unspecified part of lung [J18.9]  DISCHARGE DIAGNOSIS:  Active Problems:   Aspiration pneumonia ([place])   SECONDARY DIAGNOSIS:   Past Medical History:  Diagnosis Date  . Anxiety   . Arthritis   . Asthma   . Asthma   . Breast cancer ([place])   . Cancer ([place])   . COPD (chronic obstructive pulmonary disease) ([place])   . Enlarged aorta ([place])   . GERD (gastroesophageal reflux disease)   . High cholesterol   . Hx of skin cancer, basal cell   . Hyperlipemia   . Hypertension   . PUD (peptic ulcer disease)   . Thyroid cancer [facility])     HOSPITAL COURSE:  RillaMilleris a85 y.o.femalewith a known history of breast cancer status post left mastectomy, COPD not on home oxygen, GERD, hypertension, peptic ulcer disease, history of thyroid cancer status post subtotal thyroidectomy presents to hospital from home secondary to lower extremity swelling, recurrent falls and worsening cough and congestion.  * Acute Right sided recurrent pneumonia Resolving concerning for aspiration pneumonia Treated on our pneumonia protocol, empiric Unasyn while in house - Augmentin to complete oupt course  *AcuteLeft lower extremity cellulitis Resolving LED neg for DVT Augmentin for 5-7-day course   *Acute hypotension with history of hypertension  Resolved Discontinued lisinopril  Continued Cardizem   *Chronic severe protein calorie malnutrition  Continued meal supplementation, physical therapy  recommended home health PT status post discharge  Palliative care consulted, will continue s/p discharge given poor long term prognosis  *COPD without exacerbation Breathing treatments PRN   DISCHARGE CONDITIONS:   stable  CONSULTS OBTAINED:    DRUG ALLERGIES:  No Known Allergies  DISCHARGE MEDICATIONS:   Allergies as of 01/31/2019   No Known Allergies     Medication List    STOP taking these medications   cephALEXin 500 MG capsule Commonly known as:  KEFLEX   enalapril 20 MG tablet Commonly known as:  VASOTEC     TAKE these medications   acetaminophen 325 MG tablet Commonly known as:  TYLENOL Take 650 mg by mouth every 6 (six) hours as needed. Taking two tablets every morning.   acidophilus Caps capsule Take 1 capsule by mouth daily.   albuterol 108 (90 Base) MCG/ACT inhaler Commonly known as:  PROVENTIL HFA;VENTOLIN HFA Inhale 1-2 puffs into the lungs every 4 (four) hours as needed for wheezing or shortness of breath.   alendronate 70 MG tablet Commonly known as:  FOSAMAX Take 1 tablet (70 mg total) by mouth once a week. Take with a full glass of water on an empty stomach.   amoxicillin-clavulanate 875-125 MG tablet Commonly known as:  Augmentin Take 1 tablet by mouth 2 (two) times daily.   atorvastatin 10 MG tablet Commonly known as:  LIPITOR Take 1 tablet (10 mg total) by mouth daily.   calcium carbonate 500 MG chewable tablet Commonly known as:  TUMS - dosed in mg elemental calcium Chew 2 tablets by mouth daily.   cholecalciferol 1000 units tablet Commonly known as:  VITAMIN D Take 2,000 Units by mouth daily.   collagenase ointment Commonly known as:  SANTYL Apply topically daily.   diltiazem 120 MG 24 hr capsule Commonly known as:  DILACOR XR Take 1 capsule (120 mg total) by mouth daily. Take 2 capsules by mouth every morning, and 1 capsule every evening. What changed:  when to take this   feeding supplement (ENSURE ENLIVE) Liqd Take  237 mLs by mouth 2 (two) times daily between meals.   fesoterodine 8 MG Tb24 tablet Commonly known as:  TOVIAZ Take 1 tablet (8 mg total) by mouth daily.   Fluticasone-Salmeterol 500-50 MCG/DOSE Aepb Commonly known as:  ADVAIR Inhale 1 puff into the lungs 2 (two) times daily.   levothyroxine 150 MCG tablet Commonly known as:  SYNTHROID, LEVOTHROID Take 1 tablet (150 mcg total) by mouth daily before breakfast.   LORazepam 0.5 MG tablet Commonly known as:  ATIVAN Take 0.5 tablets (0.25 mg total) by mouth 3 (three) times daily as needed for anxiety.   mirtazapine 15 MG tablet Commonly known as:  REMERON Take 1 tablet (15 mg total) by mouth at bedtime.   multivitamin with minerals Tabs tablet Take 1 tablet by mouth daily.        DISCHARGE INSTRUCTIONS:    If you experience worsening of your admission symptoms, develop shortness of breath, life threatening emergency, suicidal or homicidal thoughts you must seek medical attention immediately by calling 911 or calling your MD immediately  if symptoms less severe.  You Must read complete instructions/literature along with all the possible adverse reactions/side effects for all the Medicines you take and that have been prescribed to you. Take any new Medicines after you have completely understood and accept all the possible adverse reactions/side effects.   Please note  You were cared for by a hospitalist during your hospital stay. If you have any questions about your discharge medications or the care you received while you were in the hospital after you are discharged, you can call the unit and asked to speak with the hospitalist on call if the hospitalist that took care of you is not available. Once you are discharged, your primary care physician will handle any further medical issues. Please note that NO REFILLS for any discharge medications will be authorized once you are discharged, as it is imperative that you return to your  primary care physician (or establish a relationship with a primary care physician if you do not have one) for your aftercare needs so that they can reassess your need for medications and monitor your lab values.    Today   CHIEF COMPLAINT:   Chief Complaint  Patient presents with  . Shortness of Breath    HISTORY OF PRESENT ILLNESS:   85 y.o. female with a known history of breast cancer status post left mastectomy, COPD not on home oxygen, GERD, hypertension, peptic ulcer disease, history of thyroid cancer status post subtotal thyroidectomy presents to hospital from home secondary to lower extremity swelling, recurrent falls and worsening cough and congestion. Patient is a poor historian.  Patient states that she has moved from [place] and has been living with her daughter for the last 6 months.  She has had 2 hospitalizations within the last 2 months.  In February she was admitted for right-sided pneumonia.  She was just discharged from the hospital for right leg cellulitis 10 days ago on Keflex.  She was discharged with home health, right leg cellulitis has been improving.  However according  to daughter patient has had falls in the last couple of days and she started noticing worsening swelling of her lower extremities and now her left lower extremity is more erythematous.  Also her lungs were sounding wet and her oxygen was low at 84% on room air.  Her PCP was called and they have advised her to come to the hospital.  Patient was noted to be in labored breathing here, initially hypoxic but oxygen saturations have improved.  CT chest showing worsening right-sided pneumonia with new consolidation in the right middle lobe.  Also has left lower extremity cellulitis.  Wbc elevated compared to her baseline.   VITAL SIGNS:  Blood pressure 107/66, pulse 74, temperature 98.4 F (36.9 C), temperature source Oral, resp. rate 18, height 5' (1.524 m), weight 51 kg, SpO2 98 %.  I/O:     Intake/Output Summary (Last 24 hours) at 01/31/2019 1047 Last data filed at 01/31/2019 0953 Gross per 24 hour  Intake 1739.31 ml  Output 2700 ml  Net -960.69 ml    PHYSICAL EXAMINATION:  GENERAL:  85 y.o.-year-old patient lying in the bed with no acute distress.  EYES: Pupils equal, round, reactive to light and accommodation. No scleral icterus. Extraocular muscles intact.  HEENT: Head atraumatic, normocephalic. Oropharynx and nasopharynx clear.  NECK:  Supple, no jugular venous distention. No thyroid enlargement, no tenderness.  LUNGS: Normal breath sounds bilaterally, no wheezing, rales,rhonchi or crepitation. No use of accessory muscles of respiration.  CARDIOVASCULAR: S1, S2 normal. No murmurs, rubs, or gallops.  ABDOMEN: Soft, non-tender, non-distended. Bowel sounds present. No organomegaly or mass.  EXTREMITIES: No pedal edema, cyanosis, or clubbing.  NEUROLOGIC: Cranial nerves II through XII are intact. Muscle strength 5/5 in all extremities. Sensation intact. Gait not checked.  PSYCHIATRIC: The patient is alert and oriented x 3.  SKIN: No obvious rash, lesion, or ulcer.   DATA REVIEW:   CBC Recent Labs  Lab 01/30/19 0548  WBC 5.7  HGB 10.1*  HCT 31.3*  PLT 260    Chemistries  Recent Labs  Lab 01/29/19 1326 01/30/19 0548  NA 132* 132*  K 3.9 3.8  CL 93* 99  CO2 29 27  GLUCOSE 118* 156*  BUN 19 17  CREATININE 0.58 0.45  CALCIUM 9.0 7.7*  AST 27  --   ALT 19  --   ALKPHOS 72  --   BILITOT 0.5  --     Cardiac Enzymes No results for input(s): TROPONINI in the last 168 hours.  Microbiology Results  Results for orders placed or performed during the hospital encounter of 01/29/19  CULTURE, BLOOD (ROUTINE X 2) w Reflex to ID Panel     Status: None (Preliminary result)   Collection Time: 01/29/19  4:28 PM  Result Value Ref Range Status   Specimen Description BLOOD BLOOD LEFT FOREARM  Final   Special Requests   Final    BOTTLES DRAWN AEROBIC AND  ANAEROBIC Blood Culture adequate volume   Culture   Final    NO GROWTH 2 DAYS Performed at [facility], [address] [place], [place] [address]    Report Status PENDING  Incomplete  CULTURE, BLOOD (ROUTINE X 2) w Reflex to ID Panel     Status: None (Preliminary result)   Collection Time: 01/29/19  5:38 PM  Result Value Ref Range Status   Specimen Description BLOOD BLOOD RIGHT FOREARM  Final   Special Requests   Final    BOTTLES DRAWN AEROBIC AND ANAEROBIC Blood Culture  results may not be optimal due to an inadequate volume of blood received in culture bottles   Culture   Final    NO GROWTH 2 DAYS Performed at [facility], [address], [place], [place] [address]    Report Status PENDING  Incomplete    RADIOLOGY:  Dg Chest 2 View  Result Date: 01/29/2019 CLINICAL DATA:  Hypoxia. EXAM: CHEST - 2 VIEW COMPARISON:  Radiographs of January 06, 2019. FINDINGS: Stable cardiomegaly. Atherosclerosis of thoracic aorta is noted. No pneumothorax or pleural effusion is noted. Stable mild left basilar subsegmental atelectasis or infiltrate. Stable right middle lobe opacity is noted. Degenerative changes seen involving both shoulder joints. IMPRESSION: Stable bilateral lung opacities as described above. Aortic Atherosclerosis (ICD10-I70.0). Electronically Signed   By: Marijo Conception, M.D.   On: 01/29/2019 13:51   Ct Chest Wo Contrast  Result Date: 01/29/2019 CLINICAL DATA:  Hypoxia, shortness of breath, pneumonia. EXAM: CT CHEST WITHOUT CONTRAST TECHNIQUE: Multidetector CT imaging of the chest was performed following the standard protocol without IV contrast. COMPARISON:  Chest x-ray today and prior chest x-ray on 01/06/2019 FINDINGS: Cardiovascular: The heart is mildly enlarged. There are calcifications associated with the mitral and aortic valves. The ascending thoracic aorta is dilated and measures 4.5 cm in estimated maximal diameter. No pericardial fluid identified. Central  pulmonary arteries are normal in caliber. Calcified plaque likely present in the distribution of the LAD. Mediastinum/Nodes: No enlarged mediastinal, hilar or axillary lymph nodes identified. The trachea is normally patent. Lungs/Pleura: There is dense atelectasis and consolidation of the right middle lobe with air bronchograms and bronchiectasis present. Consolidation of the posterior left lower lobe also present demonstrating bronchiectasis. Bronchiectasis present in the posterior right lower lobe with several distal bronchi demonstrating mucous plugging. No evidence of pulmonary edema, pleural effusions or pneumothorax. No focal nodules detected. Upper Abdomen: No acute abnormality. Musculoskeletal: No chest wall mass or suspicious bone lesions identified. IMPRESSION: 1. Dense atelectasis and consolidation of the right middle lobe and posterior left lower lobe demonstrating air bronchograms as well as bronchiectasis. Bronchiectasis also present in the posterior right lower lobe with evidence of distal mucus plugging. 2. Aneurysmal disease of the ascending thoracic aorta measuring 4.5 cm in estimated maximal diameter. This may be on the basis of aortic valvular disease given evidence of valvular calcification. Ascending thoracic aortic aneurysm. Recommend semi-annual imaging followup by CTA or MRA and referral to cardiothoracic surgery if not already obtained. This recommendation follows 2010 ACCF/AHA/AATS/ACR/ASA/SCA/SCAI/SIR/STS/SVM Guidelines for the Diagnosis and Management of Patients With Thoracic Aortic Disease. Circulation. 2010; 121: U045-W098. Aortic aneurysm NOS (ICD10-I71.9) 3. Cardiac enlargement and probable coronary atherosclerosis involving the LAD. Aortic aneurysm NOS (ICD10-I71.9). Electronically Signed   By: Aletta Edouard M.D.   On: 01/29/2019 15:27   US Abdomen Limited  Result Date: 01/30/2019 CLINICAL DATA:  Abdominal distension EXAM: LIMITED ABDOMEN ULTRASOUND FOR ASCITES TECHNIQUE:  Limited ultrasound survey for ascites was performed in all four abdominal quadrants. COMPARISON:  None. FINDINGS: Survey imaging of all four abdominal quadrants was performed. No evidence of abdominal ascites. IMPRESSION: No evidence abdominal ascites on ultrasound. Electronically Signed   By: Julian Hy M.D.   On: 01/30/2019 11:29   US Venous Img Lower Bilateral  Result Date: 01/30/2019 CLINICAL DATA:  Bilateral lower extremity edema. EXAM: BILATERAL LOWER EXTREMITY VENOUS DOPPLER ULTRASOUND TECHNIQUE: Gray-scale sonography with graded compression, as well as color Doppler and duplex ultrasound were performed to evaluate the lower extremity deep venous systems from the level of  the common femoral vein and including the common femoral, femoral, profunda femoral, popliteal and calf veins including the posterior tibial, peroneal and gastrocnemius veins when visible. The superficial great saphenous vein was also interrogated. Spectral Doppler was utilized to evaluate flow at rest and with distal augmentation maneuvers in the common femoral, femoral and popliteal veins. COMPARISON:  None. FINDINGS: RIGHT LOWER EXTREMITY Common Femoral Vein: No evidence of thrombus. Normal compressibility, respiratory phasicity and response to augmentation. Saphenofemoral Junction: No evidence of thrombus. Normal compressibility and flow on color Doppler imaging. Profunda Femoral Vein: No evidence of thrombus. Normal compressibility and flow on color Doppler imaging. Femoral Vein: No evidence of thrombus. Normal compressibility, respiratory phasicity and response to augmentation. Popliteal Vein: No evidence of thrombus. Normal compressibility, respiratory phasicity and response to augmentation. Calf Veins: No evidence of thrombus. Normal compressibility and flow on color Doppler imaging. Superficial Great Saphenous Vein: No evidence of thrombus. Normal compressibility. Venous Reflux:  None. Other Findings: No evidence of  superficial thrombophlebitis or abnormal fluid collection. LEFT LOWER EXTREMITY Common Femoral Vein: No evidence of thrombus. Normal compressibility, respiratory phasicity and response to augmentation. Saphenofemoral Junction: No evidence of thrombus. Normal compressibility and flow on color Doppler imaging. Profunda Femoral Vein: No evidence of thrombus. Normal compressibility and flow on color Doppler imaging. Femoral Vein: No evidence of thrombus. Normal compressibility, respiratory phasicity and response to augmentation. Popliteal Vein: No evidence of thrombus. Normal compressibility, respiratory phasicity and response to augmentation. Calf Veins: No evidence of thrombus. Normal compressibility and flow on color Doppler imaging. Superficial Great Saphenous Vein: No evidence of thrombus. Normal compressibility. Venous Reflux:  None. Other Findings: No evidence of superficial thrombophlebitis or abnormal fluid collection. IMPRESSION: No evidence of deep venous thrombosis in either lower extremity. Electronically Signed   By: Aletta Edouard M.D.   On: 01/30/2019 11:42    EKG:   Orders placed or performed during the hospital encounter of 01/29/19  . ED EKG  . ED EKG  . EKG 12-Lead  . EKG 12-Lead      Management plans discussed with the patient, family and they are in agreement.  CODE STATUS:     Code Status Orders  (From admission, onward)         Start     Ordered   01/29/19 1717  Full code  Continuous     01/29/19 1716        Code Status History    Date Active Date Inactive Code Status Order ID Comments User Context   01/17/2019 1121 01/19/2019 1917 Full Code [id number]  Epifanio Lesches, MD ED   12/25/2018 0238 12/27/2018 1907 Full Code [id number]  Lance Coon, MD ED   11/22/2016 1858 11/24/2016 1735 Full Code [id number]  Dustin Flock, MD Inpatient    Advance Directive Documentation     Most Recent Value  Type of Advance Directive  Healthcare Power of Attorney  Pre-existing  out of facility DNR order (yellow form or pink MOST form)  -  "MOST" Form in Place?  -      TOTAL TIME TAKING CARE OF THIS PATIENT: 40 minutes.    Avel Peace Milon Dethloff M.D on 01/31/2019 at 10:47 AM  Between 7am to 6pm - Pager - [phone]  After 6pm go to [email or web address] - password [email or web address] [place] [facility]  Office  [phone]  CC: Primary care physician; Jerrol Banana., MD   Note: This dictation was prepared with Dragon dictation along with smaller phrase technology. Any transcriptional errors that  result from this process are unintentional.

## 2019-01-31 NOTE — TOC Transition Note (Signed)
Transition of Care [facility]) - CM/SW Discharge Note   Patient Details  Name: Joyce [name] MRN: [id number] Date of Birth: 1928-07-01  [facility] CM/SW Contact:  Latanya Maudlin, RN Phone Number: 01/31/2019, 2:28 PM   Clinical Narrative: Patient to be discharged per MD order. Orders in place for home health services. [facility] active with [facility]. Notified Corene Cornea from [facility] of resumption of care orders. Patient has DME already in the home. Family to transport. Outpatient palliative referral to [place]. Family transported.       Final next level of care: Home w Home Health Services Barriers to Discharge: No Barriers Identified   Patient Goals and CMS Choice   CMS Medicare.gov Compare Post Acute Care list provided to:: Patient    Discharge Placement                       Discharge Plan and Services   Discharge Planning Services: CM Consult Post Acute Care Choice: Home Health              HH Arranged: RN, PT, Nurse's Aide [place] Agency: [place] ([facility])   Social Determinants of Health (SDOH) Interventions     Readmission Risk Interventions Readmission Risk Prevention Plan 01/19/2019  [place] or Home Care Consult Complete  Medication Review (RN Care Manager) Complete

## 2019-02-01 ENCOUNTER — Telehealth: Payer: Self-pay

## 2019-02-01 ENCOUNTER — Other Ambulatory Visit: Payer: Self-pay

## 2019-02-01 DIAGNOSIS — I10 Essential (primary) hypertension: Secondary | ICD-10-CM | POA: Diagnosis not present

## 2019-02-01 DIAGNOSIS — J189 Pneumonia, unspecified organism: Secondary | ICD-10-CM

## 2019-02-01 DIAGNOSIS — L89152 Pressure ulcer of sacral region, stage 2: Secondary | ICD-10-CM | POA: Diagnosis not present

## 2019-02-01 DIAGNOSIS — R238 Other skin changes: Secondary | ICD-10-CM | POA: Diagnosis not present

## 2019-02-01 DIAGNOSIS — Z4800 Encounter for change or removal of nonsurgical wound dressing: Secondary | ICD-10-CM | POA: Diagnosis not present

## 2019-02-01 DIAGNOSIS — J449 Chronic obstructive pulmonary disease, unspecified: Secondary | ICD-10-CM | POA: Diagnosis not present

## 2019-02-01 DIAGNOSIS — L03115 Cellulitis of right lower limb: Secondary | ICD-10-CM | POA: Diagnosis not present

## 2019-02-01 NOTE — Telephone Encounter (Signed)
Transition Care Management Follow-up Telephone Call  Date of discharge and from where: [facility] on 01/31/19.  How have you been since you were released from the hospital? Doing better, still coughing some and seeing cloudy sputum. The cellulitis in her legs looks better and the swelling has decreased. Pt is wearing her support hose. Declines wheezing, SOB, pain, fever or n/v/d.  Any questions or concerns? Enalapril was stopped during hospital stay due to low BP readings. Daughter would like to know how long she will be off this?        Would you like pt to schedule a HFU? Pt          has a home health nurse coming out to            home 1-2 times weekly.   Items Reviewed:  Did the pt receive and understand the discharge instructions provided? Yes   Medications obtained and verified? Yes   Any new allergies since your discharge? No   Dietary orders reviewed? Yes  Do you have support at home? Yes   Other (ie: DME, Home Health, etc) [place] aid is coming to twice this week and once weekly after. PT was ordered as well.   Functional Questionnaire: (I = Independent and D = Dependent)  Bathing/Dressing- I   Meal Prep- I, minimal cooking.  Eating- I  Maintaining continence- I  Transferring/Ambulation- Uses a walker.   Managing Meds- D, daughter manages meds.   Follow up appointments reviewed:    PCP Hospital f/u appt confirmed? No, daughter would like to know if PCP recommends a HFU with pt. Note made.   [facility] f/u appt confirmed? N/A  Are transportation arrangements needed? N/A  If their condition worsens, is the pt aware to call  their PCP or go to the ED? Yes  Was the patient provided with contact information for the PCP's office or ED? Yes  Was the pt encouraged to call back with questions or concerns? Yes

## 2019-02-01 NOTE — Telephone Encounter (Signed)
I am willing to do a virtual visit if that can be arranged.  I will just say stop the enalapril completely with no plan to go back on at this point unless necessary

## 2019-02-01 NOTE — Telephone Encounter (Signed)
Daughter advised and would like to set up a virtual visit. Will forward to Dr Rosanna Randy nurse pool to have this scheduled.  -MM

## 2019-02-01 NOTE — Telephone Encounter (Signed)
Stacy with [facility] (former[facility] known as [facility]) called requesting verbal orders for palliative care. She states patient was recently discharged from [facility] and palliative care was recommended. Please call back with verbal orders if D. Rosanna Randy is in agreement. [phone]

## 2019-02-01 NOTE — Telephone Encounter (Signed)
No HFU scheduled.  

## 2019-02-01 NOTE — Telephone Encounter (Signed)
Please review for orders.

## 2019-02-02 ENCOUNTER — Telehealth: Payer: Self-pay

## 2019-02-02 ENCOUNTER — Encounter: Payer: Self-pay | Admitting: *Deleted

## 2019-02-02 ENCOUNTER — Other Ambulatory Visit: Payer: Self-pay | Admitting: *Deleted

## 2019-02-02 NOTE — Telephone Encounter (Signed)
yes

## 2019-02-02 NOTE — Telephone Encounter (Signed)
L/M and advised Stacy as below.

## 2019-02-02 NOTE — Telephone Encounter (Signed)
Left message to call back  

## 2019-02-02 NOTE — Telephone Encounter (Signed)
Call to patient's home to schedule TELEMED visit.  RN spoke with patient's daughter [name].  Daughter in agreement with TELEMED visit with Palliative Care PMP[facility] SW and RN 02-03-19 at 3:00 PM.

## 2019-02-02 NOTE — Patient Outreach (Signed)
[facility]) Care Management [place] Coordination x 2   02/02/2019  Joyce [name] 10-Apr-1928 [id number]  Successful outgoing telephone call to Joyce [name], daughter/ caregiver, on [name], of Joyce [name], 85 y/o female referred to [place] by [facility] liaison after recent [name] visit March 27-29, 2020 for bilateral pneumonia and (L) lower extremity edema/ cellulitis.  Patient was discharged home to self-care where she lives with her daughter, with home health services through [facility] and with community outpatient palliative care referral through [facility] Palliative care.  Patient has history including, but not limited to COPD; breast cancer with previous (L) mastectomy; HTN; and GERD.  HIPAA/ identity verified with patient's caregiver/ daughter.  Discussed with caregiver Joyce [name] Community CM program, as well as [facility] chronic care management team embedded at [facility]; confirmed that patient is an active patient at [name], with Dr. Rosanna [name] as patient's PCP; as we discussed services, caregiver confirms that she has had previous conversations with both Joyce [name], Joyce [name] and Joyce [name], [name] Pharmacist.  Caregiver reports that she provides transportation for patient to care providers appointments and attends all appointments along with patient.  Caregiver further confirms that during her recent hospitalization, a referral was made with community Palliative Care team and states that she has not yet heard from Palliative Care team; we discussed basics of Palliative vs. Hospice services, as well as Palliative community program;  I provided caregiver with phone number for [name].  [name] [phone]) and confirmed with "Joyce [name]" that referral for community Palliative care team was placed and is currently pending; encouraged caregiver to  actively engage with community palliative care team once outreach is established, and after discussing specifics of palliative care with caregiver, she denies further questions.  Caregiver further reports: -- home health services active- reports RN from [facility] visited with patient at home yesterday; reports expects to have PT involved in patient's care as well -- no concerns/ issues/ problems around medications: caregiver reports that she manages patient's medications and has all post-[name] discharge medications and is providing to patient -- has heard from PCP office, and is waiting to hear back for follow up plans for office visit given restrictions around COVID-19; caregiver endorses that she and family are all following recommended precautions and social distancing; caregiver is able to independently verbalize accurate/ appropriate signs/ symptoms for COVID-19, along with corresponding action plan to immediately contact PCP for further direction should anyone in the household develop symptoms  -- patient "breathing is stable;" states patient has improved after being released from [name]; denies current clinical concerns and states that patient "is just very weak."  Caregiver denies further issues, concerns, or problems today.  Discussed plan that I would make [facility] Chronic Care Management embedded team and PCP at [facility] aware of referral for chronic care management of COPD in patient with [name] referral to community palliative care team pending; caregiver is agreeable to this.  I provided/ confirmed that caregiver has my direct phone number should needs arise prior to [place] Management Team outreach.  Plan:  Will place referral to [place] Management team embedded at [name] and make patient's PCP aware of same.   Oneta Rack, RN, BSN, [facility] Care Management  [phone]

## 2019-02-03 ENCOUNTER — Other Ambulatory Visit: Payer: Self-pay

## 2019-02-03 ENCOUNTER — Other Ambulatory Visit: Payer: Medicare Other

## 2019-02-03 DIAGNOSIS — Z515 Encounter for palliative care: Secondary | ICD-10-CM

## 2019-02-03 LAB — CULTURE, BLOOD (ROUTINE X 2)
Culture: NO GROWTH
Culture: NO GROWTH
Special Requests: ADEQUATE

## 2019-02-03 MED ORDER — ALBUTEROL SULFATE (2.5 MG/3ML) 0.083% IN NEBU: 2.5000 mg | INHALATION_SOLUTION | Freq: Four times a day (QID) | RESPIRATORY_TRACT | 12 refills | Status: DC | PRN

## 2019-02-03 NOTE — Telephone Encounter (Signed)
Left message to call back  

## 2019-02-03 NOTE — Progress Notes (Signed)
PATIENT NAME: Joyce [name] DOB: December 22, 1927 MRN: [id number]  PRIMARY CARE PROVIDER: Jerrol Banana., MD  RESPONSIBLE PARTY:  Acct ID - Guarantor Home Phone Work Phone Relationship Acct Type  [id number] LEONOR, [phone]  Self P/F     [address], [place], [place] [address]    PLAN OF CARE and INTERVENTIONS:               1.  GOALS OF CARE/ ADVANCE CARE PLANNING: Remain at home with daughter and son in law for as long as care can be managed.               2.  PATIENT/CAREGIVER EDUCATION:  Education on elevating lower extremities to reduce edema, education on importance of repositioning to prevent skin breakdown. Education on fall precautions.                 3.  DISEASE STATUS:Patient is a 85 year old patient with COPD, 3 recent hospitalizations for bilateral pneumonia, pneumonia and cellulitis and 4 falls in the past month. Daughter moved patient from [place] 6 months ago to live with her and her husband Coralyn Mark.     HISTORY OF PRESENT ILLNESS:    CODE STATUS: No Code  ADVANCED DIRECTIVES: Health care POA from [place]: No PPS: 40%   PHYSICAL EXAM:   VITALS:No VS obtained/ TELEHEALTH Visit LUNGS: Not assessed  CARDIAC:  EXTREMITIES: edema improved/ TED hose in place SKIN: Stage II breakdown on sacral area   NEURO: positive for gait problems and weakness   Due to the COVID-19 crisis, this visit was done via telemedicine from my office and it was initiated and consent by this patient and or family.  SW and RN discussed Palliative Care with patient's [name] and [name]'s husband Coralyn Mark. [name] reports she moved patient from [place] to live with her and Coralyn Mark 6 months ago. [name] reports patient has had 3 hospitalizations the past month. Patient was hospitalized once with bilateral pneumonia, pneumonia and cellulitis. Patient has suffered 4 falls in the past month. Patient has lost at least 15 pounds in 6 months per daughter. Patient's past medical  includes but not limited to COPD, Chronic Bronchitis, HTN, Breast Cancer S/P left mastectomy and arthritis. [facility] had been seeing patient and home health services to resume per daughter.  Daughter reports patient was also receiving PT. Patient does not complain of pain. Daughter reports patient sleeps in recliner. Patient is developing skin breakdown on sacral area. Education to daughter on need for patient to reposition every 2 hours. Patient takes Tylenol as needed for arthritic pain. Daughter reports patient is eating better but not gaining any weight. Palliative Care services explained and daughter in aggrement with Palliative services. Daughter reports patient has a living will which was completed in [place]. SW to follow up to see if living will needs to be updated. Daughter in agreement with SW and RN making home visit on Monday 02-08-19 at 2:00 as RN feels patient may be appropriate for hospice. Daughter encouraged to call with questions or concerns.          [name], RN

## 2019-02-04 DIAGNOSIS — L03115 Cellulitis of right lower limb: Secondary | ICD-10-CM | POA: Diagnosis not present

## 2019-02-04 DIAGNOSIS — Z4800 Encounter for change or removal of nonsurgical wound dressing: Secondary | ICD-10-CM | POA: Diagnosis not present

## 2019-02-04 DIAGNOSIS — J449 Chronic obstructive pulmonary disease, unspecified: Secondary | ICD-10-CM | POA: Diagnosis not present

## 2019-02-04 DIAGNOSIS — I10 Essential (primary) hypertension: Secondary | ICD-10-CM | POA: Diagnosis not present

## 2019-02-04 DIAGNOSIS — L89152 Pressure ulcer of sacral region, stage 2: Secondary | ICD-10-CM | POA: Diagnosis not present

## 2019-02-04 DIAGNOSIS — R238 Other skin changes: Secondary | ICD-10-CM | POA: Diagnosis not present

## 2019-02-05 ENCOUNTER — Encounter (INDEPENDENT_AMBULATORY_CARE_PROVIDER_SITE_OTHER): Payer: Medicare Other | Admitting: Family Medicine

## 2019-02-05 ENCOUNTER — Telehealth: Payer: Self-pay

## 2019-02-05 DIAGNOSIS — L03115 Cellulitis of right lower limb: Secondary | ICD-10-CM

## 2019-02-05 DIAGNOSIS — R238 Other skin changes: Secondary | ICD-10-CM

## 2019-02-05 DIAGNOSIS — J449 Chronic obstructive pulmonary disease, unspecified: Secondary | ICD-10-CM | POA: Diagnosis not present

## 2019-02-05 DIAGNOSIS — Z4800 Encounter for change or removal of nonsurgical wound dressing: Secondary | ICD-10-CM | POA: Diagnosis not present

## 2019-02-05 DIAGNOSIS — L89152 Pressure ulcer of sacral region, stage 2: Secondary | ICD-10-CM | POA: Diagnosis not present

## 2019-02-05 DIAGNOSIS — I10 Essential (primary) hypertension: Secondary | ICD-10-CM | POA: Diagnosis not present

## 2019-02-05 NOTE — Telephone Encounter (Signed)
Telephone call to patient's daughter Joyce [name].  Joyce [name] informed that home visits are not be made due to COV19 virus of patient is not having unmanged symptoms.  [name] in agreement with nurse calling next week to check on patient.  Daughter Joyce [name] reports [facility] nurse made home visit yesterday to see patient and felt patient was stable. [name] informed to call with questions or concerns.

## 2019-02-05 NOTE — Telephone Encounter (Signed)
ok 

## 2019-02-05 NOTE — Telephone Encounter (Signed)
Please review

## 2019-02-05 NOTE — Telephone Encounter (Signed)
Merry Proud with [facility] is requesting verbal orders for PT continuation. They will be seeing patient for re-evaluation since being discharged from hospital. He is requesting 1 week 1, 2 weeks 2, 1 week 1 for a total of 6 visits.

## 2019-02-08 ENCOUNTER — Other Ambulatory Visit: Payer: Medicare Other

## 2019-02-08 ENCOUNTER — Other Ambulatory Visit: Payer: Self-pay

## 2019-02-08 NOTE — Telephone Encounter (Signed)
Left message advising Jeff ° ° °Thanks,  ° °-Jaeda Bruso  °

## 2019-02-09 ENCOUNTER — Ambulatory Visit: Payer: Medicare Other

## 2019-02-09 ENCOUNTER — Ambulatory Visit: Payer: Medicare Other | Admitting: Family Medicine

## 2019-02-09 DIAGNOSIS — L89152 Pressure ulcer of sacral region, stage 2: Secondary | ICD-10-CM | POA: Diagnosis not present

## 2019-02-09 DIAGNOSIS — I10 Essential (primary) hypertension: Secondary | ICD-10-CM | POA: Diagnosis not present

## 2019-02-09 DIAGNOSIS — R238 Other skin changes: Secondary | ICD-10-CM | POA: Diagnosis not present

## 2019-02-09 DIAGNOSIS — Z4800 Encounter for change or removal of nonsurgical wound dressing: Secondary | ICD-10-CM | POA: Diagnosis not present

## 2019-02-09 DIAGNOSIS — J449 Chronic obstructive pulmonary disease, unspecified: Secondary | ICD-10-CM | POA: Diagnosis not present

## 2019-02-09 DIAGNOSIS — L03115 Cellulitis of right lower limb: Secondary | ICD-10-CM | POA: Diagnosis not present

## 2019-02-09 NOTE — Telephone Encounter (Signed)
Unable to contact daughter

## 2019-02-10 ENCOUNTER — Encounter: Payer: Self-pay | Admitting: Family Medicine

## 2019-02-10 ENCOUNTER — Other Ambulatory Visit: Payer: Self-pay

## 2019-02-10 ENCOUNTER — Ambulatory Visit (INDEPENDENT_AMBULATORY_CARE_PROVIDER_SITE_OTHER): Payer: Medicare Other | Admitting: Family Medicine

## 2019-02-10 VITALS — BP 134/70 | HR 88 | Temp 97.4°F | Wt 127.0 lb

## 2019-02-10 DIAGNOSIS — J479 Bronchiectasis, uncomplicated: Secondary | ICD-10-CM

## 2019-02-10 DIAGNOSIS — I10 Essential (primary) hypertension: Secondary | ICD-10-CM | POA: Diagnosis not present

## 2019-02-10 DIAGNOSIS — J189 Pneumonia, unspecified organism: Secondary | ICD-10-CM | POA: Diagnosis not present

## 2019-02-10 DIAGNOSIS — L89152 Pressure ulcer of sacral region, stage 2: Secondary | ICD-10-CM | POA: Diagnosis not present

## 2019-02-10 DIAGNOSIS — L03115 Cellulitis of right lower limb: Secondary | ICD-10-CM | POA: Diagnosis not present

## 2019-02-10 DIAGNOSIS — R238 Other skin changes: Secondary | ICD-10-CM | POA: Diagnosis not present

## 2019-02-10 DIAGNOSIS — J449 Chronic obstructive pulmonary disease, unspecified: Secondary | ICD-10-CM | POA: Diagnosis not present

## 2019-02-10 DIAGNOSIS — Z4800 Encounter for change or removal of nonsurgical wound dressing: Secondary | ICD-10-CM | POA: Diagnosis not present

## 2019-02-10 MED ORDER — AMOXICILLIN-POT CLAVULANATE 875-125 MG PO TABS: 1.0000 | ORAL_TABLET | Freq: Two times a day (BID) | ORAL | 2 refills | Status: DC

## 2019-02-10 NOTE — Progress Notes (Signed)
Patient: Joyce [name] Female    DOB: 10-08-1928   85 y.o.   MRN: [id number] Visit Date: 02/10/2019  Today's Provider: [name], MD   Chief Complaint  Patient presents with  . Cellulitis   Subjective:     HPI  Patient comes in today c/o cellulitis in her right leg. She reports that she was prescribed abx for this while she was hospitalized. She has been off of the abx for about 4 days.  Her last 24 hours the legs have a again on the right lower extremity become erythematous and mildly engorged.  No calf swelling or pain.  There is no tenderness.  Is warmth. BP Readings from Last 3 Encounters:  02/10/19 134/70  01/31/19 110/66  01/19/19 105/66   Wt Readings from Last 3 Encounters:  02/10/19 127 lb (57.6 kg)  02/03/19 112 lb (50.8 kg)  01/29/19 112 lb 7 oz (51 kg)     No Known Allergies   Current Outpatient Medications:  .  acetaminophen (TYLENOL) 325 MG tablet, Take 650 mg by mouth every 6 (six) hours as needed. Taking two tablets every morning., Disp: , Rfl:  .  acidophilus (RISAQUAD) CAPS capsule, Take 1 capsule by mouth daily., Disp: , Rfl:  .  albuterol (PROVENTIL HFA;VENTOLIN HFA) 108 (90 Base) MCG/ACT inhaler, Inhale 1-2 puffs into the lungs every 4 (four) hours as needed for wheezing or shortness of breath., Disp: 1 Inhaler, Rfl: 0 .  albuterol (PROVENTIL) (2.5 MG/3ML) 0.083% nebulizer solution, Take 3 mLs (2.5 mg total) by nebulization every 6 (six) hours as needed for wheezing or shortness of breath., Disp: 75 mL, Rfl: 12 .  alendronate (FOSAMAX) 70 MG tablet, Take 1 tablet (70 mg total) by mouth once a week. Take with a full glass of water on an empty stomach., Disp: 4 tablet, Rfl: 12 .  amoxicillin-clavulanate (AUGMENTIN) 875-125 MG tablet, Take 1 tablet by mouth 2 (two) times daily., Disp: 10 tablet, Rfl: 0 .  atorvastatin (LIPITOR) 10 MG tablet, Take 1 tablet (10 mg total) by mouth daily., Disp: 90 tablet, Rfl: 3 .  calcium carbonate (TUMS - DOSED  IN MG ELEMENTAL CALCIUM) 500 MG chewable tablet, Chew 2 tablets by mouth daily. , Disp: , Rfl:  .  cholecalciferol (VITAMIN D) 1000 units tablet, Take 2,000 Units by mouth daily., Disp: , Rfl:  .  collagenase (SANTYL) ointment, Apply topically daily., Disp: 15 g, Rfl: 0 .  diltiazem (DILACOR XR) 120 MG 24 hr capsule, Take 1 capsule (120 mg total) by mouth daily. Take 2 capsules by mouth every morning, and 1 capsule every evening. (Patient taking differently: Take 120 mg by mouth as directed. Take 2 capsules by mouth every morning, and 1 capsule every evening.), Disp: 30 capsule, Rfl: 1 .  feeding supplement, ENSURE ENLIVE, (ENSURE ENLIVE) LIQD, Take 237 mLs by mouth 2 (two) times daily between meals., Disp: 237 mL, Rfl: 12 .  fesoterodine (TOVIAZ) 8 MG TB24 tablet, Take 1 tablet (8 mg total) by mouth daily. (Patient taking differently: Take 8 mg by mouth daily. Takes 2-3 times weekly), Disp: 90 tablet, Rfl: 3 .  Fluticasone-Salmeterol (ADVAIR) 500-50 MCG/DOSE AEPB, Inhale 1 puff into the lungs 2 (two) times daily. , Disp: , Rfl:  .  levothyroxine (SYNTHROID, LEVOTHROID) 150 MCG tablet, Take 1 tablet (150 mcg total) by mouth daily before breakfast., Disp: 30 tablet, Rfl: 12 .  LORazepam (ATIVAN) 0.5 MG tablet, Take 0.5 tablets (0.25 mg total) by  mouth 3 (three) times daily as needed for anxiety., Disp: 90 tablet, Rfl: 3 .  mirtazapine (REMERON) 15 MG tablet, Take 1 tablet (15 mg total) by mouth at bedtime., Disp: 30 tablet, Rfl: 12 .  Multiple Vitamin (MULTIVITAMIN WITH MINERALS) TABS tablet, Take 1 tablet by mouth daily., Disp: , Rfl:   Review of Systems  Constitutional: Negative.   Eyes: Negative.   Respiratory: Cough: yellow mucus since 12/13/18.   Cardiovascular: Negative.   Gastrointestinal: Negative.   Endocrine: Negative.   Genitourinary: Negative.   Musculoskeletal: Negative.   Skin: Positive for color change. Negative for wound.       Erythema with mild induration of the right lower  extremity about 6 inches below the knee and further down the extremity.  There is no tenderness and minimal weeping.  Calf appears to be normal with negative Homans sign and no cords.  Allergic/Immunologic: Negative.   Neurological: Negative.   Hematological: Negative.   Psychiatric/Behavioral: Positive for sleep disturbance.  All other systems reviewed and are negative.   Social History   Tobacco Use  . Smoking status: Never Smoker  . Smokeless tobacco: Never Used  Substance Use Topics  . Alcohol use: Yes    Alcohol/week: 1.0 standard drinks    Types: 1 Cans of beer per week    Comment: occasional beer, once a year      Objective:   BP 134/70 (BP Location: Left Arm, Patient Position: Sitting, Cuff Size: Normal)   Pulse 88   Temp (!) 97.4 F (36.3 C)   Wt 127 lb (57.6 kg)   BMI 24.80 kg/m  Vitals:   02/10/19 1531  BP: 134/70  Pulse: 88  Temp: (!) 97.4 F (36.3 C)  Weight: 127 lb (57.6 kg)     Physical Exam Vitals signs reviewed.  Constitutional:      Appearance: Normal appearance.  HENT:     Head: Normocephalic and atraumatic.     Right Ear: External ear normal.     Left Ear: External ear normal.     Nose: Nose normal.     Mouth/Throat:     Pharynx: Oropharynx is clear.  Eyes:     General: No scleral icterus. Cardiovascular:     Rate and Rhythm: Normal rate and regular rhythm.     Heart sounds: Normal heart sounds.  Pulmonary:     Effort: Pulmonary effort is normal.     Breath sounds: Rhonchi present.  Musculoskeletal:     Right lower leg: No edema.     Left lower leg: No edema.  Lymphadenopathy:     Cervical: No cervical adenopathy.  Skin:    General: Skin is warm and dry.     Findings: Erythema present.     Comments: Erythema with warmth and no tenderness of the right lower extremity about 4 inches below the knee down to the ankle.  Neurological:     General: No focal deficit present.     Mental Status: She is alert.  Psychiatric:        Mood  and Affect: Mood normal.        Behavior: Behavior normal.        Thought Content: Thought content normal.        Judgment: Judgment normal.         Assessment & Plan    1. Cellulitis of right lower extremity At this time will retreat with Augmentin which is what she was on she when she went home from  the hospital.  We will asked her to take 3 times a day as a loading dose for 2 days and then twice a day for the rest of the regimen  2. Multifocal pneumonia Follow-up chest x-ray this spring.  O2 sat is 95% today.  He is breathing well and has no complaints and no cough.  3. Bronchiectasis without acute exacerbation ([place])     I have done the exam and reviewed the above chart and it is accurate to the best of my knowledge. Development worker, community has been used in this note in any air is in the dictation or transcription are unintentional.  [name], MD  [place]

## 2019-02-11 ENCOUNTER — Telehealth: Payer: Self-pay

## 2019-02-11 DIAGNOSIS — J449 Chronic obstructive pulmonary disease, unspecified: Secondary | ICD-10-CM | POA: Diagnosis not present

## 2019-02-11 DIAGNOSIS — R238 Other skin changes: Secondary | ICD-10-CM | POA: Diagnosis not present

## 2019-02-11 DIAGNOSIS — L03115 Cellulitis of right lower limb: Secondary | ICD-10-CM | POA: Diagnosis not present

## 2019-02-11 DIAGNOSIS — L89152 Pressure ulcer of sacral region, stage 2: Secondary | ICD-10-CM | POA: Diagnosis not present

## 2019-02-11 DIAGNOSIS — I10 Essential (primary) hypertension: Secondary | ICD-10-CM | POA: Diagnosis not present

## 2019-02-11 DIAGNOSIS — Z4800 Encounter for change or removal of nonsurgical wound dressing: Secondary | ICD-10-CM | POA: Diagnosis not present

## 2019-02-11 NOTE — Telephone Encounter (Signed)
SW contacted [name], patient's daughter to check-in on patient and family. [name] reports that they are doing "okay". [name] reports that the [facility] RN came to see patient yesterday and they believe patient has cellulitis again. [facility] RN contacted PCP and scheduled an appointment for yesterday afternoon. PCP started patient back on antibiotics, the goal is to keep patient out of the hospital. [name] said she feels that patient has really good days and then really bad days. Today seems to be good. PT came this morning, and [name] reports that patient did pretty good working with her. [name] noted some caregiver stress. [name] said that her PCP has had her to talk with their SW for support. [name] expressed concern for not being able to support patient's needs. [name] said she has considered ALF but is not sure about the financial aspect. SW is going to email [name] the link to [facility] information and application. [name] and SW discussed palliative care versus hospice care. [name] wants to continue with [facility] and palliative, and is appreciative of palliative team calling to check-in in case needs change. SW provided emotional support, education on care options and used active and reflective listening. SW provided contact number in case any questions or concerns arise. SW updated RN of phone call.

## 2019-02-15 ENCOUNTER — Encounter: Payer: Self-pay | Admitting: Emergency Medicine

## 2019-02-15 ENCOUNTER — Emergency Department: Payer: Medicare Other

## 2019-02-15 ENCOUNTER — Emergency Department
Admission: EM | Admit: 2019-02-15 | Discharge: 2019-02-15 | Disposition: A | Discharge status: Home / Self Care | Payer: Medicare Other | Attending: Emergency Medicine | Admitting: Emergency Medicine

## 2019-02-15 ENCOUNTER — Other Ambulatory Visit: Payer: Self-pay

## 2019-02-15 DIAGNOSIS — J449 Chronic obstructive pulmonary disease, unspecified: Secondary | ICD-10-CM | POA: Insufficient documentation

## 2019-02-15 DIAGNOSIS — M7989 Other specified soft tissue disorders: Secondary | ICD-10-CM | POA: Diagnosis present

## 2019-02-15 DIAGNOSIS — L03116 Cellulitis of left lower limb: Secondary | ICD-10-CM | POA: Insufficient documentation

## 2019-02-15 DIAGNOSIS — Z79899 Other long term (current) drug therapy: Secondary | ICD-10-CM | POA: Insufficient documentation

## 2019-02-15 DIAGNOSIS — I10 Essential (primary) hypertension: Secondary | ICD-10-CM | POA: Insufficient documentation

## 2019-02-15 DIAGNOSIS — Z96653 Presence of artificial knee joint, bilateral: Secondary | ICD-10-CM | POA: Insufficient documentation

## 2019-02-15 DIAGNOSIS — L03119 Cellulitis of unspecified part of limb: Secondary | ICD-10-CM

## 2019-02-15 DIAGNOSIS — L03115 Cellulitis of right lower limb: Secondary | ICD-10-CM | POA: Insufficient documentation

## 2019-02-15 DIAGNOSIS — R600 Localized edema: Secondary | ICD-10-CM | POA: Diagnosis not present

## 2019-02-15 DIAGNOSIS — Z85850 Personal history of malignant neoplasm of thyroid: Secondary | ICD-10-CM | POA: Diagnosis not present

## 2019-02-15 LAB — CBC
HCT: 35.7 % — ABNORMAL LOW (ref 36.0–46.0)
Hemoglobin: 11.3 g/dL — ABNORMAL LOW (ref 12.0–15.0)
MCH: 29.5 pg (ref 26.0–34.0)
MCHC: 31.7 g/dL (ref 30.0–36.0)
MCV: 93.2 fL (ref 80.0–100.0)
Platelets: 311 10*3/uL (ref 150–400)
RBC: 3.83 MIL/uL — ABNORMAL LOW (ref 3.87–5.11)
RDW: 14.9 % (ref 11.5–15.5)
WBC: 10 10*3/uL (ref 4.0–10.5)
nRBC: 0 % (ref 0.0–0.2)

## 2019-02-15 LAB — BASIC METABOLIC PANEL
Anion gap: 7 (ref 5–15)
BUN: 22 mg/dL (ref 8–23)
CO2: 31 mmol/L (ref 22–32)
Calcium: 8.5 mg/dL — ABNORMAL LOW (ref 8.9–10.3)
Chloride: 99 mmol/L (ref 98–111)
Creatinine, Ser: 0.47 mg/dL (ref 0.44–1.00)
GFR calc Af Amer: >60 mL/min (ref >60)
GFR calc non Af Amer: >60 mL/min (ref >60)
Glucose, Bld: 97 mg/dL (ref 70–99)
Potassium: 3.8 mmol/L (ref 3.5–5.1)
Sodium: 137 mmol/L (ref 135–145)

## 2019-02-15 LAB — LACTIC ACID, PLASMA: Lactic Acid, Venous: 1 mmol/L (ref 0.5–1.9)

## 2019-02-15 MED ORDER — VANCOMYCIN HCL IN DEXTROSE 1-5 GM/200ML-% IV SOLN: 1000.0000 mg | Freq: Once | INTRAVENOUS | Status: DC

## 2019-02-15 MED ORDER — CLINDAMYCIN PHOSPHATE 600 MG/50ML IV SOLN
600.0000 mg | Freq: Once | INTRAVENOUS | Status: AC
  Administered 2019-02-15: 600 mg via INTRAVENOUS
  Filled 2019-02-15: qty 50

## 2019-02-15 MED ORDER — CLINDAMYCIN HCL 300 MG PO CAPS: 300.0000 mg | ORAL_CAPSULE | Freq: Three times a day (TID) | ORAL | 0 refills | Status: AC

## 2019-02-15 MED ORDER — SODIUM CHLORIDE 0.9 % IV SOLN: 1.0000 g | Freq: Once | INTRAVENOUS | Status: DC

## 2019-02-15 NOTE — ED Notes (Signed)
First Nurse Note: Patient assisted from vehicle into [facility] complaining of "my legs".  Complaining of pain, swelling, and redness of both legs.

## 2019-02-15 NOTE — ED Triage Notes (Signed)
Pt here with c/o ble edema and redness, has been on antibiotics for cellulitis for 4 days with no relief. Pt states she has been treated on and off for pneumonia as well, without fever. Legs aching per pt.

## 2019-02-15 NOTE — Discharge Instructions (Addendum)
Continue to take the Augmentin given by your primary care doctor and add clindamycin 3 times a day as prescribed.  If you develop a fever, nausea or vomiting, or if the redness is spreading outside the area that was marcated in the emergency room please return to the emergency room for admission and IV antibiotics.  Otherwise follow-up with your doctor in 2 to 3 days.

## 2019-02-15 NOTE — ED Notes (Signed)
Patient assisted to the bedpan.

## 2019-02-15 NOTE — ED Provider Notes (Signed)
[facility] Emergency Department Provider Note  ____________________________________________  Time seen: Approximately 10:50 AM  I have reviewed the triage vital signs and the nursing notes.   HISTORY  Chief Complaint Leg Swelling   HPI Joyce [name] is a 85 y.o. female with history as listed below who presents for evaluation of cellulitis.  Patient has been admitted 3 times in the last 2 months for pneumonia and cellulitis.  She reports 4 days ago she started noticing progression of the redness on the right lower extremity and a new redness on the left lower extremity.  She denies having any pain, fever or chills, nausea or vomiting.  She saw her primary care doctor 4 days ago and was started on Augmentin.  She has been taking that with no significant improvement.  Past Medical History:  Diagnosis Date   Anxiety    Arthritis    Asthma    Asthma    Breast cancer ([place])    Cancer ([place])    COPD (chronic obstructive pulmonary disease) ([place])    Enlarged aorta (H[facility])    GERD (gastroesophageal reflux disease)    High cholesterol    Hx of skin cancer, basal cell    Hyperlipemia    Hypertension    PUD (peptic ulcer disease)    Thyroid cancer ([place])     Patient Active Problem List   Diagnosis Date Noted   Aspiration pneumonia ([place]) 01/29/2019   Cellulitis of right leg 01/17/2019   Cellulitis 01/17/2019   Pressure injury of skin 01/17/2019   Multifocal pneumonia 12/24/2018   GERD (gastroesophageal reflux disease) 12/24/2018   COPD with acute exacerbation ([place]) 12/24/2018   Acute respiratory failure with hypoxia and hypercapnia ([place]) 12/24/2018   Other nonrheumatic aortic valve disorders 10/14/2017   Do not resuscitate status 08/27/2017   Personal history of malignant neoplasm of breast 08/27/2017   Chronic venous hypertension (idiopathic) without complications of bilateral lower extremity 07/25/2017   Tachycardia,  unspecified 02/25/2017   Hyponatremia 11/22/2016   Osteoporosis 09/24/2016   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   Hematuria 07/02/2016   Mass of hand 06/04/2016   Other specified soft tissue disorders 06/04/2016   Ascending aorta dilatation ([place]) 05/15/2016   Generalized anxiety disorder 10/24/2015   Postoperative hypothyroidism 12/15/2013   Papillary carcinoma of thyroid ([place]) 11/26/2013   Primary hyperparathyroidism ([place]) 04/01/2013   Osteoarthritis of knee 03/23/2013   Nephrolithiasis 02/03/2013   Malignant neoplasm of female breast ([place]) 11/28/2009   Asthma 02/24/2009   Bronchiectasis without acute exacerbation ([place]) 02/24/2009   Arthropathy of hand 06/16/2008   Hyperlipidemia 11/16/2007   Essential hypertension 11/16/2007    Past Surgical History:  Procedure Laterality Date   APPENDECTOMY     BREAST SURGERY     left mastectomy   CHOLECYSTECTOMY     FRACTURE SURGERY     JOINT REPLACEMENT     MASTECTOMY     REPAIR OF PERFORATED ULCER     REPLACEMENT TOTAL KNEE BILATERAL     STOMACH SURGERY     THYROIDECTOMY      Prior to Admission medications   Medication Sig Start Date End Date Taking? Authorizing Provider  acetaminophen (TYLENOL) 325 MG tablet Take 650 mg by mouth every 6 (six) hours as needed. Taking two tablets every morning.    [provider]  acidophilus (RISAQUAD) CAPS capsule Take 1 capsule by mouth daily.    [provider]  albuterol (PROVENTIL HFA;VENTOLIN HFA) 108 (90 Base) MCG/ACT  inhaler Inhale 1-2 puffs into the lungs every 4 (four) hours as needed for wheezing or shortness of breath. 12/27/18   Gouru, Illene Silver, MD  albuterol (PROVENTIL) (2.5 MG/3ML) 0.083% nebulizer solution Take 3 mLs (2.5 mg total) by nebulization every 6 (six) hours as needed for wheezing or shortness of breath. 02/03/19   Jerrol Banana., MD  alendronate (FOSAMAX) 70 MG tablet Take 1 tablet (70 mg total) by mouth once  a week. Take with a full glass of water on an empty stomach. 08/06/18   Jerrol Banana., MD  amoxicillin-clavulanate (AUGMENTIN) 875-125 MG tablet Take 1 tablet by mouth 2 (two) times daily. 01/31/19   Salary, Holly Bodily D, MD  amoxicillin-clavulanate (AUGMENTIN) 875-125 MG tablet Take 1 tablet by mouth 2 (two) times daily. 02/10/19   Jerrol Banana., MD  atorvastatin (LIPITOR) 10 MG tablet Take 1 tablet (10 mg total) by mouth daily. 08/06/18   Jerrol Banana., MD  calcium carbonate (TUMS - DOSED IN MG ELEMENTAL CALCIUM) 500 MG chewable tablet Chew 2 tablets by mouth daily.     [provider]  cholecalciferol (VITAMIN D) 1000 units tablet Take 2,000 Units by mouth daily.    [provider]  clindamycin (CLEOCIN) 300 MG capsule Take 1 capsule (300 mg total) by mouth 3 (three) times daily for 10 days. 02/15/19 02/25/19  Rudene Re, MD  collagenase (SANTYL) ointment Apply topically daily. 01/19/19   Fritzi Mandes, MD  diltiazem (DILACOR XR) 120 MG 24 hr capsule Take 1 capsule (120 mg total) by mouth daily. Take 2 capsules by mouth every morning, and 1 capsule every evening. Patient taking differently: Take 120 mg by mouth as directed. Take 2 capsules by mouth every morning, and 1 capsule every evening. 01/19/19   Fritzi Mandes, MD  feeding supplement, ENSURE ENLIVE, (ENSURE ENLIVE) LIQD Take 237 mLs by mouth 2 (two) times daily between meals. 11/24/16   Fritzi Mandes, MD  fesoterodine (TOVIAZ) 8 MG TB24 tablet Take 1 tablet (8 mg total) by mouth daily. Patient taking differently: Take 8 mg by mouth daily. Takes 2-3 times weekly 09/01/18   Zara Council A, PA-C  Fluticasone-Salmeterol (ADVAIR) 500-50 MCG/DOSE AEPB Inhale 1 puff into the lungs 2 (two) times daily.     [provider]  levothyroxine (SYNTHROID, LEVOTHROID) 150 MCG tablet Take 1 tablet (150 mcg total) by mouth daily before breakfast. 08/06/18   Jerrol Banana., MD  LORazepam (ATIVAN) 0.5 MG  tablet Take 0.5 tablets (0.25 mg total) by mouth 3 (three) times daily as needed for anxiety. 08/06/18   Jerrol Banana., MD  mirtazapine (REMERON) 15 MG tablet Take 1 tablet (15 mg total) by mouth at bedtime. 12/31/18   Jerrol Banana., MD  Multiple Vitamin (MULTIVITAMIN WITH MINERALS) TABS tablet Take 1 tablet by mouth daily.    [provider]    Allergies Patient has no known allergies.  Family History  Problem Relation Age of Onset   Hypertension Father    Colon cancer Father    Stroke Mother    Bone cancer Son    Throat cancer Child     Social History Social History   Tobacco Use   Smoking status: Never Smoker   Smokeless tobacco: Never Used  Substance Use Topics   Alcohol use: Yes    Alcohol/week: 1.0 standard drinks    Types: 1 Cans of beer per week    Comment: occasional beer, once a year  Drug use: No    Review of Systems  Constitutional: Negative for fever. Eyes: Negative for visual changes. ENT: Negative for sore throat. Neck: No neck pain  Cardiovascular: Negative for chest pain. Respiratory: Negative for shortness of breath. Gastrointestinal: Negative for abdominal pain, vomiting or diarrhea. Genitourinary: Negative for dysuria. Musculoskeletal: Negative for back pain. + b/l leg redness Skin: Negative for rash. Neurological: Negative for headaches, weakness or numbness. Psych: No SI or HI  ____________________________________________   PHYSICAL EXAM:  VITAL SIGNS: ED Triage Vitals  Enc Vitals Group     BP 02/15/19 0948 132/63     Pulse Rate 02/15/19 0948 89     Resp 02/15/19 0948 16     Temp 02/15/19 0948 97.8 F (36.6 C)     Temp Source 02/15/19 0948 Oral     SpO2 02/15/19 0948 97 %     Weight --      Height --      Head Circumference --      Peak Flow --      Pain Score 02/15/19 0949 1     Pain Loc --      Pain Edu? --      Excl. in [place]? --     Constitutional: Alert and oriented. Well appearing and in  no apparent distress. HEENT:      Head: Normocephalic and atraumatic.         Eyes: Conjunctivae are normal. Sclera is non-icteric.       Mouth/Throat: Mucous membranes are moist.       Neck: Supple with no signs of meningismus. Cardiovascular: Regular rate and rhythm. No murmurs, gallops, or rubs. 2+ symmetrical distal pulses are present in all extremities. No JVD. Respiratory: Normal respiratory effort. Lungs are clear to auscultation bilaterally. No wheezes, crackles, or rhonchi.  Gastrointestinal: Soft, non tender, and non distended with positive bowel sounds. No rebound or guarding. Musculoskeletal: There is erythema and warmth on bilateral lower extremities  neurologic: Normal speech and language. Face is symmetric. Moving all extremities. No gross focal neurologic deficits are appreciated. Skin: Skin is warm, dry and intact. No rash noted. Psychiatric: Mood and affect are normal. Speech and behavior are normal.  ____________________________________________   LABS (all labs ordered are listed, but only abnormal results are displayed)  Labs Reviewed  CBC - Abnormal; Notable for the following components:      Result Value   RBC 3.83 (*)    Hemoglobin 11.3 (*)    HCT 35.7 (*)    All other components within normal limits  BASIC METABOLIC PANEL - Abnormal; Notable for the following components:   Calcium 8.5 (*)    All other components within normal limits  CULTURE, BLOOD (ROUTINE X 2)  CULTURE, BLOOD (ROUTINE X 2)  LACTIC ACID, PLASMA   ____________________________________________  EKG  none  ____________________________________________  RADIOLOGY  I have personally reviewed the images performed during this visit and I agree with the Radiologist's read.   Interpretation by Radiologist:  US Venous Img Lower Bilateral  Result Date: 02/15/2019 CLINICAL DATA:  Bilateral lower extremity edema and erythema. EXAM: BILATERAL LOWER EXTREMITY VENOUS DOPPLER ULTRASOUND  TECHNIQUE: Gray-scale sonography with graded compression, as well as color Doppler and duplex ultrasound were performed to evaluate the lower extremity deep venous systems from the level of the common femoral vein and including the common femoral, femoral, profunda femoral, popliteal and calf veins including the posterior tibial, peroneal and gastrocnemius veins when visible. The superficial great saphenous vein was  also interrogated. Spectral Doppler was utilized to evaluate flow at rest and with distal augmentation maneuvers in the common femoral, femoral and popliteal veins. COMPARISON:  None. FINDINGS: RIGHT LOWER EXTREMITY Common Femoral Vein: No evidence of thrombus. Normal compressibility, respiratory phasicity and response to augmentation. Saphenofemoral Junction: No evidence of thrombus. Normal compressibility and flow on color Doppler imaging. Profunda Femoral Vein: No evidence of thrombus. Normal compressibility and flow on color Doppler imaging. Femoral Vein: No evidence of thrombus. Normal compressibility, respiratory phasicity and response to augmentation. Popliteal Vein: No evidence of thrombus. Normal compressibility, respiratory phasicity and response to augmentation. Calf Veins: No evidence of thrombus. Normal compressibility and flow on color Doppler imaging. Superficial Great Saphenous Vein: No evidence of thrombus. Normal compressibility. Venous Reflux:  None. Other Findings: No evidence of superficial thrombophlebitis or abnormal fluid collection. LEFT LOWER EXTREMITY Common Femoral Vein: No evidence of thrombus. Normal compressibility, respiratory phasicity and response to augmentation. Saphenofemoral Junction: No evidence of thrombus. Normal compressibility and flow on color Doppler imaging. Profunda Femoral Vein: No evidence of thrombus. Normal compressibility and flow on color Doppler imaging. Femoral Vein: No evidence of thrombus. Normal compressibility, respiratory phasicity and response  to augmentation. Popliteal Vein: No evidence of thrombus. Normal compressibility, respiratory phasicity and response to augmentation. Calf Veins: No evidence of thrombus. Normal compressibility and flow on color Doppler imaging. Superficial Great Saphenous Vein: No evidence of thrombus. Normal compressibility. Venous Reflux:  None. Other Findings: No evidence of superficial thrombophlebitis or abnormal fluid collection. IMPRESSION: No evidence of deep venous thrombosis in either lower extremity. Electronically Signed   By: Aletta Edouard M.D.   On: 02/15/2019 11:03     ____________________________________________   PROCEDURES  Procedure(s) performed: None Procedures Critical Care performed:  None ____________________________________________   INITIAL IMPRESSION / ASSESSMENT AND PLAN / ED COURSE  85 y.o. female with history as listed below who presents for evaluation of cellulitis.  Patient presents with recurrent cellulitis.  Doppler studies were done and negative for DVT.  Patient has no evidence of sepsis with normal white count, normal heart rate, normal temperature, normal lactic acid.  Blood cultures were sent.  Patient was given a dose of IV clindamycin.  I spoke with her daughter on the phone about sending patient home on Augmentin and clindamycin with close monitoring.  The area of cellulitis has been demarcated and I recommended return to the emergency room if the redness is spreading or if patient develops a fever, nausea or vomiting.  I also offered to admit patient to the hospital if family felt uncomfortable however due to the current COVID 44 pandemic, patient's age, and the fact that she has normal labs and vital signs I did believe that it was in the patient's benefit to try oral antibiotics at home instead of admission.  After having this discussion with the daughter she agrees and taking patient home.      As part of my medical decision making, I reviewed the following data  within the [place] History obtained from family, Nursing notes reviewed and incorporated, Labs reviewed , Old chart reviewed, Radiograph reviewed , Notes from prior ED visits and [place] Controlled Substance Database    Pertinent labs & imaging results that were available during my care of the patient were reviewed by me and considered in my medical decision making (see chart for details).    ____________________________________________   FINAL CLINICAL IMPRESSION(S) / ED DIAGNOSES  Final diagnoses:  Cellulitis of lower extremity, unspecified laterality  NEW MEDICATIONS STARTED DURING THIS VISIT:  ED Discharge Orders         Ordered    clindamycin (CLEOCIN) 300 MG capsule  3 times daily     02/15/19 1217           Note:  This document was prepared using Dragon voice recognition software and may include unintentional dictation errors.    Alfred Levins, [place], MD 02/15/19 1230

## 2019-02-16 DIAGNOSIS — Z4800 Encounter for change or removal of nonsurgical wound dressing: Secondary | ICD-10-CM | POA: Diagnosis not present

## 2019-02-16 DIAGNOSIS — L89152 Pressure ulcer of sacral region, stage 2: Secondary | ICD-10-CM | POA: Diagnosis not present

## 2019-02-16 DIAGNOSIS — R238 Other skin changes: Secondary | ICD-10-CM | POA: Diagnosis not present

## 2019-02-16 DIAGNOSIS — J449 Chronic obstructive pulmonary disease, unspecified: Secondary | ICD-10-CM | POA: Diagnosis not present

## 2019-02-16 DIAGNOSIS — I10 Essential (primary) hypertension: Secondary | ICD-10-CM | POA: Diagnosis not present

## 2019-02-16 DIAGNOSIS — L03115 Cellulitis of right lower limb: Secondary | ICD-10-CM | POA: Diagnosis not present

## 2019-02-17 DIAGNOSIS — Z4800 Encounter for change or removal of nonsurgical wound dressing: Secondary | ICD-10-CM | POA: Diagnosis not present

## 2019-02-17 DIAGNOSIS — J449 Chronic obstructive pulmonary disease, unspecified: Secondary | ICD-10-CM | POA: Diagnosis not present

## 2019-02-17 DIAGNOSIS — L89152 Pressure ulcer of sacral region, stage 2: Secondary | ICD-10-CM | POA: Diagnosis not present

## 2019-02-17 DIAGNOSIS — R238 Other skin changes: Secondary | ICD-10-CM | POA: Diagnosis not present

## 2019-02-17 DIAGNOSIS — L03115 Cellulitis of right lower limb: Secondary | ICD-10-CM | POA: Diagnosis not present

## 2019-02-17 DIAGNOSIS — I10 Essential (primary) hypertension: Secondary | ICD-10-CM | POA: Diagnosis not present

## 2019-02-18 DIAGNOSIS — I10 Essential (primary) hypertension: Secondary | ICD-10-CM | POA: Diagnosis not present

## 2019-02-18 DIAGNOSIS — R238 Other skin changes: Secondary | ICD-10-CM | POA: Diagnosis not present

## 2019-02-18 DIAGNOSIS — L03115 Cellulitis of right lower limb: Secondary | ICD-10-CM | POA: Diagnosis not present

## 2019-02-18 DIAGNOSIS — L89152 Pressure ulcer of sacral region, stage 2: Secondary | ICD-10-CM | POA: Diagnosis not present

## 2019-02-18 DIAGNOSIS — Z4800 Encounter for change or removal of nonsurgical wound dressing: Secondary | ICD-10-CM | POA: Diagnosis not present

## 2019-02-18 DIAGNOSIS — J449 Chronic obstructive pulmonary disease, unspecified: Secondary | ICD-10-CM | POA: Diagnosis not present

## 2019-02-20 DIAGNOSIS — Z4800 Encounter for change or removal of nonsurgical wound dressing: Secondary | ICD-10-CM | POA: Diagnosis not present

## 2019-02-20 DIAGNOSIS — R238 Other skin changes: Secondary | ICD-10-CM | POA: Diagnosis not present

## 2019-02-20 DIAGNOSIS — J449 Chronic obstructive pulmonary disease, unspecified: Secondary | ICD-10-CM | POA: Diagnosis not present

## 2019-02-20 DIAGNOSIS — L89152 Pressure ulcer of sacral region, stage 2: Secondary | ICD-10-CM | POA: Diagnosis not present

## 2019-02-20 DIAGNOSIS — L03115 Cellulitis of right lower limb: Secondary | ICD-10-CM | POA: Diagnosis not present

## 2019-02-20 DIAGNOSIS — I10 Essential (primary) hypertension: Secondary | ICD-10-CM | POA: Diagnosis not present

## 2019-02-20 LAB — CULTURE, BLOOD (ROUTINE X 2)
Culture: NO GROWTH
Culture: NO GROWTH
Special Requests: ADEQUATE
Special Requests: ADEQUATE

## 2019-02-24 DIAGNOSIS — J449 Chronic obstructive pulmonary disease, unspecified: Secondary | ICD-10-CM | POA: Diagnosis not present

## 2019-02-24 DIAGNOSIS — I10 Essential (primary) hypertension: Secondary | ICD-10-CM | POA: Diagnosis not present

## 2019-02-24 DIAGNOSIS — R238 Other skin changes: Secondary | ICD-10-CM | POA: Diagnosis not present

## 2019-02-24 DIAGNOSIS — L89152 Pressure ulcer of sacral region, stage 2: Secondary | ICD-10-CM | POA: Diagnosis not present

## 2019-02-24 DIAGNOSIS — L03115 Cellulitis of right lower limb: Secondary | ICD-10-CM | POA: Diagnosis not present

## 2019-02-24 DIAGNOSIS — Z4800 Encounter for change or removal of nonsurgical wound dressing: Secondary | ICD-10-CM | POA: Diagnosis not present

## 2019-02-25 DIAGNOSIS — L03115 Cellulitis of right lower limb: Secondary | ICD-10-CM | POA: Diagnosis not present

## 2019-02-25 DIAGNOSIS — Z4800 Encounter for change or removal of nonsurgical wound dressing: Secondary | ICD-10-CM | POA: Diagnosis not present

## 2019-02-25 DIAGNOSIS — R238 Other skin changes: Secondary | ICD-10-CM | POA: Diagnosis not present

## 2019-02-25 DIAGNOSIS — L89152 Pressure ulcer of sacral region, stage 2: Secondary | ICD-10-CM | POA: Diagnosis not present

## 2019-02-25 DIAGNOSIS — I10 Essential (primary) hypertension: Secondary | ICD-10-CM | POA: Diagnosis not present

## 2019-02-25 DIAGNOSIS — J449 Chronic obstructive pulmonary disease, unspecified: Secondary | ICD-10-CM | POA: Diagnosis not present

## 2019-02-26 ENCOUNTER — Telehealth: Payer: Self-pay

## 2019-02-26 NOTE — Telephone Encounter (Signed)
Telephone call to follow up on patient's status.  RN spoke with patient's [name].  [name] reports [facility] PT discharged patient yesterday.  [name] reports [facility] nurse will discharge next week.  Daughter reports patient had ER visit 02/15/19 for cellulitis on right lower extremity with new redness on left lower extremity.  Daughter reports patient received IV antibiotic in the ER and was sent home with oral antibiotics.  Daughter states patient right leg is beginning to swell again.  Daughter informed to call home health RN and to make sure patient keeps LE's elevated.  Daughter feels patient is doing fairly well other than suffering with chronic cellulitis. Daughter encouraged to call with questions or concerns.

## 2019-03-03 DIAGNOSIS — J449 Chronic obstructive pulmonary disease, unspecified: Secondary | ICD-10-CM | POA: Diagnosis not present

## 2019-03-03 DIAGNOSIS — I10 Essential (primary) hypertension: Secondary | ICD-10-CM | POA: Diagnosis not present

## 2019-03-03 DIAGNOSIS — Z4800 Encounter for change or removal of nonsurgical wound dressing: Secondary | ICD-10-CM | POA: Diagnosis not present

## 2019-03-03 DIAGNOSIS — L89152 Pressure ulcer of sacral region, stage 2: Secondary | ICD-10-CM | POA: Diagnosis not present

## 2019-03-03 DIAGNOSIS — L03115 Cellulitis of right lower limb: Secondary | ICD-10-CM | POA: Diagnosis not present

## 2019-03-03 DIAGNOSIS — R238 Other skin changes: Secondary | ICD-10-CM | POA: Diagnosis not present

## 2019-03-04 ENCOUNTER — Institutional Professional Consult (permissible substitution): Payer: Medicare Other | Admitting: Pulmonary Disease

## 2019-03-04 ENCOUNTER — Telehealth: Payer: Self-pay

## 2019-03-04 ENCOUNTER — Ambulatory Visit (INDEPENDENT_AMBULATORY_CARE_PROVIDER_SITE_OTHER): Payer: Medicare Other

## 2019-03-04 DIAGNOSIS — J471 Bronchiectasis with (acute) exacerbation: Secondary | ICD-10-CM | POA: Diagnosis not present

## 2019-03-04 DIAGNOSIS — L03115 Cellulitis of right lower limb: Secondary | ICD-10-CM

## 2019-03-04 DIAGNOSIS — J441 Chronic obstructive pulmonary disease with (acute) exacerbation: Secondary | ICD-10-CM

## 2019-03-04 DIAGNOSIS — I10 Essential (primary) hypertension: Secondary | ICD-10-CM

## 2019-03-04 NOTE — Progress Notes (Signed)
This encounter was created in error - please disregard.

## 2019-03-04 NOTE — Telephone Encounter (Signed)
Telephone call to patient/family to schedule TELEHEALTH visit with patient. Spoke with [name], patient's daughter, and she is in agreement with TELEHEALTH visit on 03/12/2019 at 10:00 AM.

## 2019-03-04 NOTE — Patient Instructions (Addendum)
Thank you allowing the Chronic Care Management Team to be a part of your care! It was a pleasure speaking with you today!  1. Please review all education information provided to you and call me with any questions you may have. 2. Consider completing a new Advanced Directives Packet and once notarized, provide a copy to Dr. Marlan Palau office. 3. Call for PCP appointment confirmation for next week and change to acute appointment if needed. Dr. Rosanna Randy needs to assess cellulitis and assist you with an ongoing plan of care. You are correct that you cannot keep going to the ED for IV antibiotics. 4. Continue to engage with Palliative Care. 5. Please follow your prescribed diet for aspiration pneumonia prevention we discussed today. 6. Continue to use your Advair daily as prescribed. This is an inhaler that will PREVENT COPD "flares"  CCM (Chronic Care Management) Team   Trish Fountain RN, BSN Nurse Care Coordinator  [phone]  Ruben Reason PharmD  Clinical Pharmacist  [phone]   [place], LCSW Clinical Social Worker [phone]  Goals Addressed            This Visit's Progress   . per daughter "Her cellulitis keeps returning and I am not sure how to keep managing it" (pt-stated)       Current Barriers:  Marland Kitchen Knowledge Deficits related to understanding plan of care for Cellulitis treatment  Nurse Case Manager Clinical Goal(s):  Marland Kitchen Over the next 30 days, patient will not experience hospital admission. Hospital Admissions in last 6 months = 3 . Over the next 14 days, patient will attend all scheduled medical appointments: Daughter to request acute appointment to evaluate right leg cellulitis . Over the next 14 days, patient will verbalize basic understanding of cellulitis/complications of disease process and self health management plan as evidenced by keeping leg elevated as often as possible, taking medications as prescribed, reporting new or worsening symptoms . Over the  next 30 days, patient will work with [facility] (community agency) to additional needs as necessary  Interventions:  . Advised patient to call provider for acute appointment to evaluate cellulitis and to establish a plan of care . Provided education to patient re: cellulitis complications and treatment . Discussed plans with patient for ongoing care management follow up and provided patient with direct contact information for care management team . Provided patient with written educational materials related to cellulitis management . Referral to [facility] Pharmacist for medication review  Reviewed scheduled/upcoming provider appointments including: 03/11/2019 AWV and PCP yearly physical  Patient Self Care Activities:  . Attends all scheduled provider appointments . Calls pharmacy for medication refills . Calls provider office for new concerns or questions  Initial goal documentation     . per daughter "I did not know she should be taking Advir every day. Its very expensive" (pt-stated)       Current Barriers:   Knowledge deficit related to basic COPD self care/management  Knowledge deficit related to basic understanding of how to use inhalers and how inhaled medications work  Nurse Case Manager Clinical Goal(s):  Over the next 14 days patient will report using inhalers as prescribed including rinsing mouth after use  Over the next 14 days patient will report utilizing pursed lip breathing for shortness of breath  Over the next 14 days, patient will be able to verbalize understanding of COPD action plan and when to seek appropriate levels of medical care  Over the next 14 days, patient will engage with  [facility]ist for medication management (Advair)   COPD ACTION PLAN Actions to Take if My Symptoms Get Worse   WHAT ZONE ARE YOU IN TODAY? Green, Yellow, or Red?    GREEN ZONE: I am doing well today.   Symptoms Actions .  Usual activity and exercise  level . Usual amounts of cough or phlegm/musuc . Sleep well at night . Appetite is good . Continue to take daily "maintenance" medicines (the ones you take "no matter what" until your doctor adjusts them for you) . Use oxygen as prescribed . Continue regular exercise/diet plan . At all times avoid cigarette smoke, inhaled iritants     YELLOW ZONE: I am having a bad day or a COPD flare.  Symptoms Actions .  More breathless than usual . I have less energy for my daily activities . Increased or thicker phlegm/mucus . Using quick relief inhaler/nebulizer more often . More coughing than usual . I feel like I have a "chest cold" . Poor sleep and my symptoms woke me up . My appetite is not good . My medicine is not helping . Swelling of ankles more than usual . Continue daily medications . Use quick relief inhaler or nebulizer every 4 hours . Use oxygen as prescribed . Get plenty of rest . Use pursed lip breathing . At all times avoid cigarette smoke, inhaled irritants.  Marland Kitchen CALL PROVIDER for same day or next day appointment for the following:  o If symptoms are not improving within 48 hours of onset or o If symptoms are not improving after quick relief inhaler or nebulizer o Start an oral corticosteroid (specify name, dose, and duration) . Start an antibiotic (specify name, dose, duration)    RED ZONE: I NEED URGENT MEDICAL CARE  Symptoms Actions .  Severe shortness of breath even at rest . Severe shortness of breath even after quick relief inhaler or nebulizer . Not able to lay flat because of breathing . Fever or shaking chills . Feeling confused or very drowsy . Chest pains . Coughin up blood . Quick relief inhaler or nebulizer not effective . Call 911 or have someone take you to the nearest emergency room . Call provider for now or same day appointment     3-2-1 PLAN: If any of the 3 main COPD symptoms (Cough, Shortness of Breath, or Mucus Production) change for 2 days or  more, your number 1 priority if to call your doctor.     Interventions:   Provided patient with basic written and verbal COPD education on self care/management/and exacerbation prevention   Provided patient with COPD action plan and reinforced importance of daily self assessment  Referral to [facility] Pharmacist   Patient Self Care Activities:   Take medications as prescribed including inhalers  Practice and use pursed lip breathing for shortness of breath recovery and prevention  Self assess COPD action plan zone and make appointment with provider if you have been in the yellow zone for 48 hours without improvement.  Utilize infection prevention strategies to reduce risk of respiratory infection (handwashing, pulmonary toileting, medication compliance)     . per daughter "Mom keeps getting pneumonia" (pt-stated)       Current Barriers:  Marland Kitchen Knowledge Deficits related to understanding how to prevent aspitation pneumonia  . Covid-19 restriction preventing daughter/caregiver from being present during last hospitalization for pneumonia . Patients cognition and ability to recall details of hospitalization  Nurse Case Manager Clinical Goal(s):  Marland Kitchen Over the next 14  days, patient will verbalize understanding of plan for aspiration pneumonia prevention . Over the next 30 days, patient will not experience hospital admission. Hospital Admissions in last 6 months = 3 . Over the next 14 days, patient will demonstrate improved adherence to prescribed treatment plan for aspiration pneumonia prevention as evidenced bysitting up at 90 degrees during meals, eating a Dysphagia 2 diet, taking pills whole in applesauce, and drinking thin liquids without a straw  Interventions:  . Evaluation of current treatment plan related to pneumonia prevention and patient's adherence to plan as established by provider. . Provided education to patient re: aspitation pneumonia and how to prevent  reoccurance . Discussed plans with patient for ongoing care management follow up and provided patient with direct contact information for care management team . Provided patient with written educational materials related to pneumonia prevention/signs and symptoms of infection . Reviewed scheduled/upcoming provider appointments including:  . Pharmacy referral for AWV and PCP physical 03/11/2019  Patient Self Care Activities:  . Self administers medications as prescribed . Attends all scheduled provider appointments . Calls pharmacy for medication refills . Calls provider office for new concerns or questions  . Adhere to Dysphagia 2 diet   Initial goal documentation        Print copy of patient instructions provided. via email  Telephone follow up appointment with CCM team member scheduled for: next week Follow up with provider re: acute appointment for cellulitis L leg to establish plan of care Next PCP appointment scheduled for: 03/11/2019 Next AWV (Annual Wellness Visit) scheduled for: 03/11/2019  Aspiration Pneumonia Aspiration pneumonia is an infection in the lungs. It occurs when saliva or liquid contaminated with bacteria is inhaled (aspirated) into the lungs. When these things get into the lungs, swelling (inflammation) and infection can occur. This can make it difficult to breathe. Aspiration pneumonia is a serious condition and can be life threatening. What are the causes? This condition is caused when saliva or liquid from the mouth, throat, or stomach is inhaled into the lungs, and when those fluids are contaminated with bacteria. What increases the risk? The following factors may make you more likely to develop this condition:  A narrowing of the tube that carries food to the stomach (esophageal narrowing).  Having gastroesophageal reflux disease (GERD).  Having a weak immune system.  Having diabetes.  Having poor oral hygiene.  Being malnourished. The condition is  more likely to occur when a person's cough (gag) reflex, or ability to swallow, has decreased. Some things that can cause this decrease include:  Having a brain injury or disease, such as stroke, seizures, Parkinson disease, dementia, or amyotrophic lateral sclerosis (ALS).  Being given a general anesthetic for procedures.  Drinking too much alcohol. If a person passes out and vomits, vomit can be inhaled into the lungs.  Taking certain medicines, such as tranquilizers or sedatives. What are the signs or symptoms? Symptoms of this condition include:  Fever.  A cough with secretions that are yellow, tan, or green.  Breathing problems, such as wheezing or shortness of breath.  Chest pain.  Being more tired than usual (fatigue).  Having a history of coughing while eating or drinking.  Bad breath.  Bluish color to the lips, skin, or fingers. How is this diagnosed? This condition may be diagnosed based on:  A physical exam.  Tests, such as: ? Chest X-ray. ? Sputum culture. Saliva and mucus (sputum) are collected from the lungs or the tubes that carry air to the  lungs (bronchi). The sputum is then tested for bacteria. ? Oximetry. A sensor or clip is placed on areas such as a finger, earlobe, or toe to measure the oxygen level in your blood. ? Blood tests. ? Swallowing study. This test looks at how food is swallowed and whether it goes into your breathing tube (trachea) or esophagus. ? Bronchoscopy. This test uses a flexible tube (bronchoscope) to see inside the lungs. How is this treated? This condition may be treated with:  Medicines. Antibiotic medicine will be given to kill the pneumonia bacteria. Other medicines may also be used to reduce fever or pain.  Breathing assistance and oxygen therapy. Depending on how well you are breathing, you may need to be given oxygen, or you may need breathing support from a breathing machine (ventilator).  Thoracentesis. This is a  procedure to remove fluid that has built up in the space between the linings of the chest wall and the lungs.  Feeding tube and diet change. For people who have difficulty swallowing, a feeding tube might be placed in the stomach, or they may be asked to avoid certain food textures or liquids when eating. Follow these instructions at home: Medicines  Take over-the-counter and prescription medicines only as told by your health care provider. ? If you were prescribed an antibiotic medicine, take it as told by your health care provider. Do not stop taking the antibiotic even if you start to feel better. ? Take cough medicine only if you are losing sleep. Cough medicine can prevent your body's natural ability to remove mucus from your lungs. General instructions  Carefully follow any eating instructions you were given, such as avoiding certain food textures or thickening your liquids. Thickening liquids reduces the risk of developing aspiration pneumonia again.  Use breathing exercises such as postural drainage, deep breathing, and incentive spirometry to help expel secretions.  Rest as instructed by your health care provider.  Sleep in a semi-upright position at night. Try to sleep in a reclining chair, or place a few pillows under your head.  Do not use any products that contain nicotine or tobacco, such as cigarettes and e-cigarettes. If you need help quitting, ask your health care provider.  Keep all follow-up visits as told by your health care provider. This is important. Contact a health care provider if:  You have a fever.  You have a worsening cough with yellow, tan, or green secretions.  You have coughing while eating or drinking. Get help right away if:  You have worsening shortness of breath, wheezing, or difficulty breathing.  You have chest pain. Summary  Aspiration pneumonia is an infection in the lungs. It is caused when saliva or liquid from the mouth, throat, or  stomach is inhaled into the lungs.  Aspiration pneumonia is more likely to occur when a person's cough reflex or ability to swallow has decreased.  Symptoms of aspiration pneumonia include coughing, breathing problems, fever, and chest pain.  Aspiration pneumonia may be treated with antibiotic medicine, other medicines to reduce pain or fever, and breathing assistance or oxygen therapy. This information is not intended to replace advice given to you by your health care provider. Make sure you discuss any questions you have with your health care provider. Document Released: 08/18/2009 Document Revised: 11/26/2016 Document Reviewed: 11/26/2016 Elsevier Interactive Patient Education  2019 [employer]   Cellulitis, Adult  Cellulitis is a skin infection. The infected area is usually warm, red, swollen, and tender. This condition occurs most often  in the arms and lower legs. The infection can travel to the muscles, blood, and underlying tissue and become serious. It is very important to get treated for this condition. What are the causes? Cellulitis is caused by bacteria. The bacteria enter through a break in the skin, such as a cut, burn, insect bite, open sore, or crack. What increases the risk? This condition is more likely to occur in people who:  Have a weak body defense system (immune system).  Have open wounds on the skin, such as cuts, burns, bites, and scrapes. Bacteria can enter the body through these open wounds.  Are older than 83 years of age.  Have diabetes.  Have a type of long-lasting (chronic) liver disease (cirrhosis) or kidney disease.  Are obese.  Have a skin condition such as: ? Itchy rash (eczema). ? Slow movement of blood in the veins (venous stasis). ? Fluid buildup below the skin (edema).  Have had radiation therapy.  Use IV drugs. What are the signs or symptoms? Symptoms of this condition include:  Redness, streaking, or spotting on the  skin.  Swollen area of the skin.  Tenderness or pain when an area of the skin is touched.  Warm skin.  A fever.  Chills.  Blisters. How is this diagnosed? This condition is diagnosed based on a medical history and physical exam. You may also have tests, including:  Blood tests.  Imaging tests. How is this treated? Treatment for this condition may include:  Medicines, such as antibiotic medicines or medicines to treat allergies (antihistamines).  Supportive care, such as rest and application of cold or warm cloths (compresses) to the skin.  Hospital care, if the condition is severe. The infection usually starts to get better within 1-2 days of treatment. Follow these instructions at home:  Medicines  Take over-the-counter and prescription medicines only as told by your health care provider.  If you were prescribed an antibiotic medicine, take it as told by your health care provider. Do not stop taking the antibiotic even if you start to feel better. General instructions  Drink enough fluid to keep your urine pale yellow.  Do not touch or rub the infected area.  Raise (elevate) the infected area above the level of your heart while you are sitting or lying down.  Apply warm or cold compresses to the affected area as told by your health care provider.  Keep all follow-up visits as told by your health care provider. This is important. These visits let your health care provider make sure a more serious infection is not developing. Contact a health care provider if:  You have a fever.  Your symptoms do not begin to improve within 1-2 days of starting treatment.  Your bone or joint underneath the infected area becomes painful after the skin has healed.  Your infection returns in the same area or another area.  You notice a swollen bump in the infected area.  You develop new symptoms.  You have a general ill feeling (malaise) with muscle aches and pains. Get help  right away if:  Your symptoms get worse.  You feel very sleepy.  You develop vomiting or diarrhea that persists.  You notice red streaks coming from the infected area.  Your red area gets larger or turns dark in color. These symptoms may represent a serious problem that is an emergency. Do not wait to see if the symptoms will go away. Get medical help right away. Call your local emergency services (  911 in the [place] Do not drive yourself to the hospital. Summary  Cellulitis is a skin infection. This condition occurs most often in the arms and lower legs.  Treatment for this condition may include medicines, such as antibiotic medicines or antihistamines.  Take over-the-counter and prescription medicines only as told by your health care provider. If you were prescribed an antibiotic medicine, do not stop taking the antibiotic even if you start to feel better.  Contact a health care provider if your symptoms do not begin to improve within 1-2 days of starting treatment or your symptoms get worse.  Keep all follow-up visits as told by your health care provider. This is important. These visits let your health care provider make sure that a more serious infection is not developing. This information is not intended to replace advice given to you by your health care provider. Make sure you discuss any questions you have with your health care provider. Document Released: 07/31/2005 Document Revised: 03/12/2018 Document Reviewed: 03/12/2018 Elsevier Interactive Patient Education  2019 [employer].

## 2019-03-04 NOTE — Chronic Care Management (AMB) (Signed)
Chronic Care Management   Initial Visit Note  03/04/2019 Name: AMBRIELLE KINGTON MRN: [id number] DOB: 07/05/1928  Subjective: "I understand Mom can't keep going to the ED for IV antibiotics and the pills do not treat her cellulitis"  Objective:  Assessment:  Haylei L. Keefe is a 85 year old female who sees Dr. Miguel Aschoff for primary care. Ms. Crosby has been active with [facility] who asked CCM embedded team to continue to follow. Patient has a history of but not limited to HTN, COPD, Aspiration Pneumonia and Cellulitis of right leg. Telephone outreach to patient's daughter and caregiver [name] to introduce CCM services. Ms. Daniel Nones consented and initial assessment and goal setting was initiated.  Review of patient status, including review of consultants reports, relevant laboratory and other test results, and collaboration with appropriate care team members and the patient's provider was performed as part of comprehensive patient evaluation and provision of chronic care management services.     3 Hospitalizations and 1 ED visit in the last 6 months  Goals Addressed            This Visit's Progress   . per daughter "Her cellulitis keeps returning and I am not sure how to keep managing it" (pt-stated)       According to [name], daughter and caregiver to patient, Ms. Fear has been suffering from cellulitis of the left leg for some time. She has been on intermittant antibiotics for several months and according to Ms. Daniel Nones, the oral antibiotics have little affect on clearing up the infection. Patient has also had intermittent IV antibiotics, most recently 02/15/2019 during an ED visit as advised by her [place]. Daughter states IV antibiotics cleared the area  However over the last several days, it appears to be getting worse and even the [facility] nurse commented that the area is "warm". Daughter reports patient has not been compliant with keeping the affected extremity  elevated and there is swelling of the area. Daughter is trying to manage at home until patient's appointment with Dr. Rosanna Randy next week. Patient is aware of symptoms that would require another ED visit. Palliative care is involved. Daughter appropriately states patient "cannot keep going to the ED for IV antibiotics". Daughter states patient is very much appreciative of quality of life and not quantity. Daughter would to discuss goals of care and establish an ongoing plan to manage patients cellulitis at home. She is prepared to discuss with Dr. Rosanna Randy during next weeks visit.   Current Barriers:  Marland Kitchen Knowledge Deficits related to understanding plan of care for Cellulitis treatment  Nurse Case Manager Clinical Goal(s):  Marland Kitchen Over the next 30 days, patient will not experience hospital admission. Hospital Admissions in last 6 months = 3 . Over the next 14 days, patient will attend all scheduled medical appointments: Daughter to request acute appointment to evaluate right leg cellulitis . Over the next 14 days, patient will verbalize basic understanding of cellulitis/complications of disease process and self health management plan as evidenced by keeping leg elevated as often as possible, taking medications as prescribed, reporting new or worsening symptoms . Over the next 30 days, patient will work with [facility] (community agency) to additional needs as necessary  Interventions:  . Advised patient to call provider for acute appointment to evaluate cellulitis and to establish a plan of care . Provided education to patient re: cellulitis complications and treatment . Discussed plans with patient for ongoing care management follow up and  provided patient with direct contact information for care management team . Provided patient with written educational materials related to cellulitis management . Referral to [facility] Pharmacist for medication review  Reviewed scheduled/upcoming provider  appointments including: 03/11/2019 AWV and PCP yearly physical  Patient Self Care Activities:  . Attends all scheduled provider appointments . Calls pharmacy for medication refills . Calls provider office for new concerns or questions  Initial goal documentation     . per daughter "I did not know she should be taking Advair every day. Its very expensive" (pt-stated)       Daughter states patients breathing is at baseline. Confirms patients diagnosis and hospitalization x 2 for pneumonia. She is unaware of COPD action plan. Discussed medications and daughter confirms she is administering Albuterol nebulizer twice daily but is not giving patient her Advair. States she thought nebulizer took the place of the Advair inhaler.   Current Barriers:   Knowledge deficit related to basic COPD self care/management  Knowledge deficit related to basic understanding of how to use inhalers and how inhaled medications work  Nurse Case Manager Clinical Goal(s):  Over the next 14 days patient will report using inhalers as prescribed including rinsing mouth after use  Over the next 14 days patient will report utilizing pursed lip breathing for shortness of breath  Over the next 14 days, patient will be able to verbalize understanding of COPD action plan and when to seek appropriate levels of medical care  Over the next 14 days, patient will engage with [facility] pharmacist for medication management (Advair)   COPD ACTION PLAN Actions to Take if My Symptoms Get Worse   WHAT ZONE ARE YOU IN TODAY? Green, Yellow, or Red?    GREEN ZONE: I am doing well today.   Symptoms Actions .  Usual activity and exercise level . Usual amounts of cough or phlegm/musuc . Sleep well at night . Appetite is good . Continue to take daily "maintenance" medicines (the ones you take "no matter what" until your doctor adjusts them for you) . Use oxygen as prescribed . Continue regular exercise/diet plan . At all times  avoid cigarette smoke, inhaled iritants     YELLOW ZONE: I am having a bad day or a COPD flare.  Symptoms Actions .  More breathless than usual . I have less energy for my daily activities . Increased or thicker phlegm/mucus . Using quick relief inhaler/nebulizer more often . More coughing than usual . I feel like I have a "chest cold" . Poor sleep and my symptoms woke me up . My appetite is not good . My medicine is not helping . Swelling of ankles more than usual . Continue daily medications . Use quick relief inhaler or nebulizer every 4 hours . Use oxygen as prescribed . Get plenty of rest . Use pursed lip breathing . At all times avoid cigarette smoke, inhaled irritants.  Marland Kitchen CALL PROVIDER for same day or next day appointment for the following:  o If symptoms are not improving within 48 hours of onset or o If symptoms are not improving after quick relief inhaler or nebulizer o Start an oral corticosteroid (specify name, dose, and duration) . Start an antibiotic (specify name, dose, duration)    RED ZONE: I NEED URGENT MEDICAL CARE  Symptoms Actions .  Severe shortness of breath even at rest . Severe shortness of breath even after quick relief inhaler or nebulizer . Not able to lay flat because of breathing .  Fever or shaking chills . Feeling confused or very drowsy . Chest pains . Coughin up blood . Quick relief inhaler or nebulizer not effective . Call 911 or have someone take you to the nearest emergency room . Call provider for now or same day appointment     3-2-1 PLAN: If any of the 3 main COPD symptoms (Cough, Shortness of Breath, or Mucus Production) change for 2 days or more, your number 1 priority if to call your doctor.     Interventions:   Provided patient with basic written and verbal COPD education on self care/management/and exacerbation prevention including mechanism of inhaler action (rescue and maintenance)  Provided patient with COPD action plan and  reinforced importance of daily self assessment  Referral to [facility] Pharmacist   Patient Self Care Activities:   Take medications as prescribed including inhalers  Practice and use pursed lip breathing for shortness of breath recovery and prevention  Self assess COPD action plan zone and make appointment with provider if you have been in the yellow zone for 48 hours without improvement.  Utilize infection prevention strategies to reduce risk of respiratory infection (handwashing, pulmonary toileting, medication compliance)     . per daughter "Mom keeps getting pneumonia" (pt-stated)       Patient has been hospitalized twice in the last 6 months for aspitation pneumonia. The last was 01/29/2019. Unfortunately during this time, COViD-19 restrictions were in place and daughter was not allowed to stay with patient. During discussion with daughter, it was identified she was unaware of patients swallow study AND recommendations for Dysphagia 2 diet post discharge. She is also unaware of the difference between aspiration pneumonia and other types of pneumonia.  Current Barriers:  Marland Kitchen Knowledge Deficits related to understanding how to prevent aspitation pneumonia  . Covid-19 restriction preventing daughter/caregiver from being present during last hospitalization for pneumonia . Patients cognition and ability to recall details of hospitalization  Nurse Case Manager Clinical Goal(s):  Marland Kitchen Over the next 14 days, patient will verbalize understanding of plan for aspiration pneumonia prevention . Over the next 30 days, patient will not experience hospital admission. Hospital Admissions in last 6 months = 3 . Over the next 14 days, patient will demonstrate improved adherence to prescribed treatment plan for aspiration pneumonia prevention as evidenced bysitting up at 90 degrees during meals, eating a Dysphagia 2 diet, taking pills whole in applesauce, and drinking thin liquids without a straw   Interventions:  . Evaluation of current treatment plan related to pneumonia prevention and patient's adherence to plan as established by provider. . Provided education to patient re: aspitation pneumonia and how to prevent reoccurance . Discussed plans with patient for ongoing care management follow up and provided patient with direct contact information for care management team . Provided patient with written educational materials related to pneumonia prevention/signs and symptoms of infection . Reviewed scheduled/upcoming provider appointments including:  . Pharmacy referral for AWV and PCP physical 03/11/2019  Patient Self Care Activities:  . Self administers medications as prescribed . Attends all scheduled provider appointments . Calls pharmacy for medication refills . Calls provider office for new concerns or questions  . Adhere to Dysphagia 2 diet   Initial goal documentation         Follow up plan:  Telephone follow up appointment with CCM team member scheduled for: 1 week   Ms. Llewellyn was given information about Chronic Care Management services today including:  1. CCM service includes personalized support from designated  clinical staff supervised by her physician, including individualized plan of care and coordination with other care providers 2. 24/7 contact phone numbers for assistance for urgent and routine care needs. 3. Service will only be billed when office clinical staff spend 20 minutes or more in a month to coordinate care. 4. Only one practitioner may furnish and bill the service in a calendar month. 5. The patient may stop CCM services at any time (effective at the end of the month) by phone call to the office staff. 6. The patient will be responsible for cost sharing (co-pay) of up to 20% of the service fee (after annual deductible is met).  Patient's daughter/caregiver agreed to services of patient behalf and verbal consent obtained.  Charmine Bockrath E. Rollene Rotunda, RN,  BSN Nurse Care Coordinator [facility] Care Management [phone]

## 2019-03-08 ENCOUNTER — Ambulatory Visit: Payer: Self-pay | Admitting: Pharmacist

## 2019-03-08 DIAGNOSIS — L03115 Cellulitis of right lower limb: Secondary | ICD-10-CM

## 2019-03-08 DIAGNOSIS — J441 Chronic obstructive pulmonary disease with (acute) exacerbation: Secondary | ICD-10-CM

## 2019-03-08 NOTE — Chronic Care Management (AMB) (Signed)
  Chronic Care Management   Note  03/08/2019 Name: Joyce [name] MRN: [id number] DOB: 11-15-1927  85 y.o. year old female referred to Chronic Care Management by Netta Cedars, [facility] hospital liason for 3 hospitalizations in 6 months. Upcoming office visit with Jerrol Banana., MD on 03/11/19 regarding recent hospitalizations and recurrent cellulitis.   Was unable to reach patient's daughter Joyce [name] (DPR on file) via telephone today and have left HIPAA compliant voicemail asking Mrs. Joyce [name] to return my call. (unsuccessful outreach #1). Clinical pharmacy concern: recurrent cellulitis w/o MRSA coverage, risk of c.diff, and Advair use/cost.  Follow up plan: The CM team will reach out to the patient again over the next 7 days.   Ruben Reason, PharmD Clinical Pharmacist [place] [phone]

## 2019-03-10 ENCOUNTER — Encounter: Payer: Self-pay | Admitting: Family Medicine

## 2019-03-10 ENCOUNTER — Ambulatory Visit: Payer: Medicare Other | Admitting: Pharmacist

## 2019-03-10 ENCOUNTER — Ambulatory Visit (INDEPENDENT_AMBULATORY_CARE_PROVIDER_SITE_OTHER): Payer: Medicare Other | Admitting: Family Medicine

## 2019-03-10 ENCOUNTER — Other Ambulatory Visit: Payer: Self-pay

## 2019-03-10 VITALS — BP 122/68 | HR 96 | Temp 97.9°F | Resp 20 | Wt 126.0 lb

## 2019-03-10 DIAGNOSIS — L03115 Cellulitis of right lower limb: Secondary | ICD-10-CM

## 2019-03-10 DIAGNOSIS — J479 Bronchiectasis, uncomplicated: Secondary | ICD-10-CM

## 2019-03-10 DIAGNOSIS — J690 Pneumonitis due to inhalation of food and vomit: Secondary | ICD-10-CM | POA: Diagnosis not present

## 2019-03-10 DIAGNOSIS — N390 Urinary tract infection, site not specified: Secondary | ICD-10-CM

## 2019-03-10 MED ORDER — CLINDAMYCIN HCL 300 MG PO CAPS: 300.0000 mg | ORAL_CAPSULE | Freq: Three times a day (TID) | ORAL | 1 refills | Status: DC

## 2019-03-10 NOTE — Progress Notes (Signed)
Patient: Joyce [name] Female    DOB: 30-Mar-1928   85 y.o.   MRN: [id number] Visit Date: 03/10/2019  Today's Provider: [name], MD   Chief Complaint  Patient presents with  . Leg Swelling   Subjective:     HPI Patient comes in today c/o swelling in her right lower leg. She reports that it has worsened over the last few days. She also has swelling in her left leg, but it is not as bad. She reports that she woke up this morning with her right leg feeling numb.   No Known Allergies   Current Outpatient Medications:  .  acidophilus (RISAQUAD) CAPS capsule, Take 1 capsule by mouth daily., Disp: , Rfl:  .  albuterol (PROVENTIL HFA;VENTOLIN HFA) 108 (90 Base) MCG/ACT inhaler, Inhale 1-2 puffs into the lungs every 4 (four) hours as needed for wheezing or shortness of breath., Disp: 1 Inhaler, Rfl: 0 .  atorvastatin (LIPITOR) 10 MG tablet, Take 1 tablet (10 mg total) by mouth daily., Disp: 90 tablet, Rfl: 3 .  calcium carbonate (TUMS - DOSED IN MG ELEMENTAL CALCIUM) 500 MG chewable tablet, Chew 2 tablets by mouth daily. , Disp: , Rfl:  .  cholecalciferol (VITAMIN D) 1000 units tablet, Take 2,000 Units by mouth daily., Disp: , Rfl:  .  collagenase (SANTYL) ointment, Apply topically daily., Disp: 15 g, Rfl: 0 .  diltiazem (DILACOR XR) 120 MG 24 hr capsule, Take 1 capsule (120 mg total) by mouth daily. Take 2 capsules by mouth every morning, and 1 capsule every evening. (Patient taking differently: Take 120 mg by mouth as directed. Take 2 capsules by mouth every morning, and 1 capsule every evening.), Disp: 30 capsule, Rfl: 1 .  feeding supplement, ENSURE ENLIVE, (ENSURE ENLIVE) LIQD, Take 237 mLs by mouth 2 (two) times daily between meals., Disp: 237 mL, Rfl: 12 .  acetaminophen (TYLENOL) 325 MG tablet, Take 650 mg by mouth every 6 (six) hours as needed. Taking two tablets every morning., Disp: , Rfl:  .  albuterol (PROVENTIL) (2.5 MG/3ML) 0.083% nebulizer solution, Take 3 mLs  (2.5 mg total) by nebulization every 6 (six) hours as needed for wheezing or shortness of breath., Disp: 75 mL, Rfl: 12 .  alendronate (FOSAMAX) 70 MG tablet, Take 1 tablet (70 mg total) by mouth once a week. Take with a full glass of water on an empty stomach., Disp: 4 tablet, Rfl: 12 .  fesoterodine (TOVIAZ) 8 MG TB24 tablet, Take 1 tablet (8 mg total) by mouth daily. (Patient not taking: Reported on 03/04/2019), Disp: 90 tablet, Rfl: 3 .  Fluticasone-Salmeterol (ADVAIR) 500-50 MCG/DOSE AEPB, Inhale 1 puff into the lungs 2 (two) times daily. , Disp: , Rfl:  .  levothyroxine (SYNTHROID, LEVOTHROID) 150 MCG tablet, Take 1 tablet (150 mcg total) by mouth daily before breakfast., Disp: 30 tablet, Rfl: 12 .  LORazepam (ATIVAN) 0.5 MG tablet, Take 0.5 tablets (0.25 mg total) by mouth 3 (three) times daily as needed for anxiety. (Patient not taking: Reported on 03/04/2019), Disp: 90 tablet, Rfl: 3 .  mirtazapine (REMERON) 15 MG tablet, Take 1 tablet (15 mg total) by mouth at bedtime., Disp: 30 tablet, Rfl: 12 .  Multiple Vitamin (MULTIVITAMIN WITH MINERALS) TABS tablet, Take 1 tablet by mouth daily., Disp: , Rfl:   Review of Systems  Constitutional: Positive for activity change. Negative for appetite change, diaphoresis, fatigue and unexpected weight change.  HENT: Negative.   Respiratory: Negative for cough and  shortness of breath.   Cardiovascular: Positive for leg swelling. Negative for chest pain and palpitations.  Endocrine: Negative.   Musculoskeletal: Positive for gait problem and myalgias. Negative for arthralgias.  Skin: Positive for color change. Negative for pallor, rash and wound.  Allergic/Immunologic: Negative.   Neurological: Negative for dizziness, weakness, light-headedness and headaches.  Hematological: Does not bruise/bleed easily.  Psychiatric/Behavioral: Negative.     Social History   Tobacco Use  . Smoking status: Never Smoker  . Smokeless tobacco: Never Used  Substance Use  Topics  . Alcohol use: Yes    Alcohol/week: 1.0 standard drinks    Types: 1 Cans of beer per week    Comment: occasional beer, once a year      Objective:   BP 122/68 (BP Location: Right Arm, Patient Position: Sitting, Cuff Size: Normal)   Pulse 96   Temp 97.9 F (36.6 C)   Resp 20   Wt 126 lb (57.2 kg)   SpO2 95%   BMI 24.61 kg/m  Vitals:   03/10/19 1102  BP: 122/68  Pulse: 96  Resp: 20  Temp: 97.9 F (36.6 C)  SpO2: 95%  Weight: 126 lb (57.2 kg)     Physical Exam Vitals signs reviewed.  Constitutional:      Appearance: She is not toxic-appearing.  HENT:     Head: Normocephalic and atraumatic.     Right Ear: External ear normal.     Left Ear: External ear normal.  Eyes:     General: No scleral icterus.    Conjunctiva/sclera: Conjunctivae normal.  Cardiovascular:     Rate and Rhythm: Normal rate and regular rhythm.     Heart sounds: Normal heart sounds.  Pulmonary:     Effort: Pulmonary effort is normal.     Breath sounds: Normal breath sounds.  Abdominal:     Palpations: Abdomen is soft.  Skin:    General: Skin is warm and dry.     Findings: Erythema present.     Comments: Mildly erythematous and indurated skin of right greater than left lower leg.The induration is more striking than erythema and there is no tenderness or drainage.  Neurological:     General: No focal deficit present.     Mental Status: She is alert and oriented to person, place, and time. Mental status is at baseline.  Psychiatric:        Mood and Affect: Mood normal.        Behavior: Behavior normal.        Thought Content: Thought content normal.        Judgment: Judgment normal.         Assessment & Plan    1. Cellulitis of right lower extremity Cover with abx.  2. Bronchiectasis without acute exacerbation ([place])   3. Aspiration pneumonia of both lungs, unspecified aspiration pneumonia type, unspecified part of lung ([place]) Repeat CXR in 1-2 months--clinically pt stable  today with normal exam and no hypoxia.      [name], MD  [facility]

## 2019-03-11 ENCOUNTER — Ambulatory Visit: Payer: Medicare Other

## 2019-03-11 ENCOUNTER — Ambulatory Visit: Payer: Medicare Other | Admitting: Family Medicine

## 2019-03-11 ENCOUNTER — Telehealth: Payer: Self-pay

## 2019-03-11 NOTE — Telephone Encounter (Signed)
Joyce [name]- Pharmacist called to report patient has not had MRSA coverage. She recommends Doxy + Bactrim for treatment of cellulitis. Patient has a telephone appointment today. CB# [phone]

## 2019-03-12 ENCOUNTER — Other Ambulatory Visit: Payer: Medicare Other

## 2019-03-12 ENCOUNTER — Other Ambulatory Visit: Payer: Self-pay

## 2019-03-12 DIAGNOSIS — Z515 Encounter for palliative care: Secondary | ICD-10-CM

## 2019-03-12 NOTE — Progress Notes (Signed)
COMMUNITY PALLIATIVE CARE SW NOTE  PATIENT NAME: Joyce [name] DOB: 03/02/1928 MRN: [id number]  PRIMARY CARE PROVIDER: Jerrol Banana., MD  RESPONSIBLE PARTY:  Acct ID - Guarantor Home Phone Work Phone Relationship Acct Type  [id number] Joyce [name], Joyce [name]  Self P/F     [address] [place], [place] [address]     PLAN OF CARE and INTERVENTIONS:             1. GOALS OF CARE/ ADVANCE CARE PLANNING:  Goal is to remain at home as long as possible. Patient's daughter is assisting with completing HCPOA/living will form. Patient is a DNR, RN to mail a DNR form to the home at family request. 2. SOCIAL/EMOTIONAL/SPIRITUAL ASSESSMENT/ INTERVENTIONS:  SW and RN met with patient, [name] (daughter) and Coralyn Mark (son-in-law) via [facility] visit. Patient has improved since team last talked with family. Patient does continue to have cellulitis of her right leg. Patient was started on oral antibiotic by PCP yesterday. [name] is worried patient will need IV antibiotics but is hopeful that oral will work. Patient seems to be in good spirits, spending time with family and eating sweets when she can.  3. PATIENT/CAREGIVER EDUCATION/ COPING:  Given patient's improvements, daughter reports that she is doing "better". Family said they practice self-care by getting take-out meals and eating in their camper outside. Patient was smiling, appeared happy during visit. No concerns at this time.  4. PERSONAL EMERGENCY PLAN:  Family will call 9-1-1 in emergencies. Patient also has a LifeAlert necklace. Due to COVID-19 crisis, family verbalized that she is remaining at home and avoiding close contact wit others. 5. COMMUNITY RESOURCES COORDINATION/ HEALTH CARE NAVIGATION:  Home health services discharged patient. [name] mentioned possibly moving patient to ALF but due to the restrictions with COVID-19, [name] is unsure at this time. SW to monitor for any needs/questions.  6. FINANCIAL/LEGAL CONCERNS/INTERVENTIONS:  None.     SOCIAL HX:  Social History   Tobacco Use  . Smoking status: Never Smoker  . Smokeless tobacco: Never Used  Substance Use Topics  . Alcohol use: Yes    Alcohol/week: 1.0 standard drinks    Types: 1 Cans of beer per week    Comment: occasional beer, once a year    CODE STATUS:   Code Status: Prior  ADVANCED DIRECTIVES: N MOST FORM COMPLETE:  No HOSPICE EDUCATION PROVIDED: None.  PPS: Patient needs assistance with tolieting and bathing. Patient is able to feed herself and prepare light meals. Patient ambulates with walker.  Due to the COVID-19 crisis, this visit was done via Zoom from my office and it was initiated and consent by this patient and/or family. This was a scheduled visit.   I spent 45 minutes with patient/family, from 10:00-10:45a providing education, support and consultation.     [name], LCSW

## 2019-03-12 NOTE — Progress Notes (Signed)
PATIENT NAME: Joyce [name] DOB: September 24, 1928 MRN: [id number]  PRIMARY CARE PROVIDER: Jerrol Banana., MD  RESPONSIBLE PARTY:  Acct ID - Guarantor Home Phone Work Phone Relationship Acct Type  [id number] GRISELDA, [phone]  Self P/F     [address] [place], [place] [address]    PLAN OF CARE and INTERVENTIONS:               1.  GOALS OF CARE/ ADVANCE CARE PLANNING:  Remain in home with daughter and son in law as long as patient's care can be managed.               2.  PATIENT/CAREGIVER EDUCATION:  Education on s/s of infection, fever, cough, chills, shortness of breath, fatigue, education on fall precautions.               4. PERSONAL EMERGENCY PLAN:  Patient has [facility].               5.  DISEASE STATUS:Patient is a 85 year old patient.  SW and RN met with patient,s [name] and son in [employer] officer, [employer] for [place] visit. Due to the COVID-19 crisis, this visit was done via telemedicine from my office and it was initiated and consent by this patient and or family. Patient has improved but continues to have chronic cellulitis of right lower extremity.  Patient is able to ambulate with her walker and prepare her self light meals.  Daughter reports Isabel has discharged patient.  Patient has MD appointment with Dr Rosanna Randy yesterday for cellulitis and was started on Doxycycline for cellulitis.  Daughter reports oral antibiotics typically do not work well for patient.  Patient denies having any pain but reports leg "feels numb." RN encouraged patient to keep lower extremity elevated as much as pssible.  Daughter reports skin breakdown on sacral area is healing and patient continues to apply lotion to area.  Patient has shortness pf breath on exertion and continues to use Advair bid and do nebulizer treatments.  Patient has an occasional cough of clear sputum per daughter.  Patient is not currently on O2.  Patient's appetite is good and daughter reports patient enjoys eating sweets.   Patient's right lower extremity is slightly discolored but has been worse per daughter and son in law.  Family would like DNR for patient.  RN will mail signed DNR form to daughter.  SW to assist with advanced care directiviies.  Family denies having any concerns at the present time.  Daughter reports patient continues to sleep in recliner.  Patient, daughter and son on law encouraged to call with concerns.                 HISTORY OF PRESENT ILLNESS:    CODE STATUS: No Code  ADVANCED DIRECTIVES: N MOST FORM: No PPS: 50%   PHYSICAL EXAM:   VITALS:TELEHEALTH visit, no vitals taken  LUNGS: shortness of breath on exertion/ occasional productive cough/clear sputum CARDIAC: EXTREMITIES: 1+ edema right lower extremity due to cellulitis  SKIN: healed stage II decub on sacral area  NEURO: alert and oriented. Forgetful       [name], RN

## 2019-03-12 NOTE — Telephone Encounter (Signed)
If improving no change--if not change to Doxycycline 100mg  BID for 1 week.

## 2019-03-15 NOTE — Telephone Encounter (Signed)
Spoke with the patient's daughter and she reports that her symptoms about the same. She has only taken the abx for 3 days. Advised to let us know if it worsens.

## 2019-04-05 ENCOUNTER — Encounter: Payer: Self-pay | Admitting: Family Medicine

## 2019-04-05 ENCOUNTER — Telehealth: Payer: Self-pay

## 2019-04-05 NOTE — Telephone Encounter (Signed)
Telephone call to patient to schedule TELEHEALTH visit with patient/family. Patient/family in agreement with TELEHEALTH visit on 04-08-19 at 2:00PM.

## 2019-04-06 MED ORDER — DOXYCYCLINE HYCLATE 100 MG PO TABS: 100.0000 mg | ORAL_TABLET | Freq: Two times a day (BID) | ORAL | 0 refills | Status: AC

## 2019-04-08 ENCOUNTER — Other Ambulatory Visit: Payer: Self-pay

## 2019-04-08 ENCOUNTER — Other Ambulatory Visit: Payer: Medicare Other

## 2019-04-12 ENCOUNTER — Telehealth: Payer: Self-pay

## 2019-04-12 NOTE — Telephone Encounter (Signed)
Telephone call to patient to reschedule TELEHEALTH visit with patient/family. Cancelled previously by family. Patient/family in agreement with TELEHEALTH visit on 04-14-19 at 2:00PM.

## 2019-04-13 ENCOUNTER — Ambulatory Visit
Admission: RE | Admit: 2019-04-13 | Discharge: 2019-04-13 | Disposition: A | Discharge status: Home / Self Care | Payer: Medicare Other | Attending: Family Medicine | Admitting: Family Medicine

## 2019-04-13 ENCOUNTER — Ambulatory Visit (INDEPENDENT_AMBULATORY_CARE_PROVIDER_SITE_OTHER): Payer: Medicare Other | Admitting: Family Medicine

## 2019-04-13 ENCOUNTER — Encounter: Payer: Self-pay | Admitting: Family Medicine

## 2019-04-13 ENCOUNTER — Other Ambulatory Visit: Payer: Self-pay

## 2019-04-13 ENCOUNTER — Ambulatory Visit
Admission: RE | Admit: 2019-04-13 | Discharge: 2019-04-13 | Disposition: A | Discharge status: Home / Self Care | Payer: Medicare Other | Source: Ambulatory Visit | Attending: Family Medicine | Admitting: Family Medicine

## 2019-04-13 VITALS — BP 100/63 | HR 83 | Temp 97.7°F | Resp 18 | Wt 132.2 lb

## 2019-04-13 DIAGNOSIS — I77810 Thoracic aortic ectasia: Secondary | ICD-10-CM | POA: Diagnosis not present

## 2019-04-13 DIAGNOSIS — J479 Bronchiectasis, uncomplicated: Secondary | ICD-10-CM | POA: Diagnosis not present

## 2019-04-13 DIAGNOSIS — L03115 Cellulitis of right lower limb: Secondary | ICD-10-CM

## 2019-04-13 DIAGNOSIS — J189 Pneumonia, unspecified organism: Secondary | ICD-10-CM

## 2019-04-13 DIAGNOSIS — Z66 Do not resuscitate: Secondary | ICD-10-CM

## 2019-04-13 DIAGNOSIS — E890 Postprocedural hypothyroidism: Secondary | ICD-10-CM

## 2019-04-13 DIAGNOSIS — I10 Essential (primary) hypertension: Secondary | ICD-10-CM | POA: Diagnosis not present

## 2019-04-13 DIAGNOSIS — R627 Adult failure to thrive: Secondary | ICD-10-CM

## 2019-04-13 DIAGNOSIS — R0602 Shortness of breath: Secondary | ICD-10-CM | POA: Diagnosis not present

## 2019-04-13 DIAGNOSIS — C73 Malignant neoplasm of thyroid gland: Secondary | ICD-10-CM

## 2019-04-13 MED ORDER — DOXYCYCLINE HYCLATE 100 MG PO TABS: 100.0000 mg | ORAL_TABLET | Freq: Two times a day (BID) | ORAL | 0 refills | Status: DC

## 2019-04-13 NOTE — Progress Notes (Signed)
Patient: Joyce [name] Female    DOB: [birth year]   85 y.o.   MRN: [id number] Visit Date: 04/13/2019  Today's Provider: [name], MD   Chief Complaint  Patient presents with  . Follow-up   Subjective:     HPI  1 Month follow up. Patient states pain in her left knee has been going on for a month. Medication refill on Collagenase Her breathing has been stable. Her leg erythema and swelling remain an issue even after 1 week of Doxycycline.Her lung issue has been stable but her leg /cellulitis has been ongoing. No fever or systemic symptoms. Daughter is now getting some help with care of her mother. No Known Allergies   Current Outpatient Medications:  .  acetaminophen (TYLENOL) 325 MG tablet, Take 650 mg by mouth every 6 (six) hours as needed. Taking two tablets every morning., Disp: , Rfl:  .  acidophilus (RISAQUAD) CAPS capsule, Take 1 capsule by mouth daily., Disp: , Rfl:  .  albuterol (PROVENTIL HFA;VENTOLIN HFA) 108 (90 Base) MCG/ACT inhaler, Inhale 1-2 puffs into the lungs every 4 (four) hours as needed for wheezing or shortness of breath., Disp: 1 Inhaler, Rfl: 0 .  albuterol (PROVENTIL) (2.5 MG/3ML) 0.083% nebulizer solution, Take 3 mLs (2.5 mg total) by nebulization every 6 (six) hours as needed for wheezing or shortness of breath., Disp: 75 mL, Rfl: 12 .  alendronate (FOSAMAX) 70 MG tablet, Take 1 tablet (70 mg total) by mouth once a week. Take with a full glass of water on an empty stomach., Disp: 4 tablet, Rfl: 12 .  atorvastatin (LIPITOR) 10 MG tablet, Take 1 tablet (10 mg total) by mouth daily., Disp: 90 tablet, Rfl: 3 .  calcium carbonate (TUMS - DOSED IN MG ELEMENTAL CALCIUM) 500 MG chewable tablet, Chew 2 tablets by mouth daily. , Disp: , Rfl:  .  cholecalciferol (VITAMIN D) 1000 units tablet, Take 2,000 Units by mouth daily., Disp: , Rfl:  .  collagenase (SANTYL) ointment, Apply topically daily., Disp: 15 g, Rfl: 0 .  diltiazem (DILACOR XR) 120 MG 24 hr  capsule, Take 1 capsule (120 mg total) by mouth daily. Take 2 capsules by mouth every morning, and 1 capsule every evening. (Patient taking differently: Take 120 mg by mouth as directed. Take 2 capsules by mouth every morning, and 1 capsule every evening.), Disp: 30 capsule, Rfl: 1 .  doxycycline (VIBRA-TABS) 100 MG tablet, Take 1 tablet (100 mg total) by mouth 2 (two) times daily for 7 days., Disp: 14 tablet, Rfl: 0 .  feeding supplement, ENSURE ENLIVE, (ENSURE ENLIVE) LIQD, Take 237 mLs by mouth 2 (two) times daily between meals., Disp: 237 mL, Rfl: 12 .  Fluticasone-Salmeterol (ADVAIR) 500-50 MCG/DOSE AEPB, Inhale 1 puff into the lungs 2 (two) times daily. , Disp: , Rfl:  .  levothyroxine (SYNTHROID, LEVOTHROID) 150 MCG tablet, Take 1 tablet (150 mcg total) by mouth daily before breakfast., Disp: 30 tablet, Rfl: 12 .  LORazepam (ATIVAN) 0.5 MG tablet, Take 0.5 tablets (0.25 mg total) by mouth 3 (three) times daily as needed for anxiety., Disp: 90 tablet, Rfl: 3 .  mirtazapine (REMERON) 15 MG tablet, Take 1 tablet (15 mg total) by mouth at bedtime., Disp: 30 tablet, Rfl: 12 .  Multiple Vitamin (MULTIVITAMIN WITH MINERALS) TABS tablet, Take 1 tablet by mouth daily., Disp: , Rfl:  .  clindamycin (CLEOCIN) 300 MG capsule, Take 1 capsule (300 mg total) by mouth 3 (three) times daily., Disp:  30 capsule, Rfl: 1 .  fesoterodine (TOVIAZ) 8 MG TB24 tablet, Take 1 tablet (8 mg total) by mouth daily. (Patient not taking: Reported on 03/04/2019), Disp: 90 tablet, Rfl: 3  Review of Systems  Constitutional: Positive for fatigue.       Chronic fatigue  HENT: Negative.   Eyes: Negative.   Respiratory: Negative.   Cardiovascular: Positive for leg swelling.  Endocrine: Negative.   Musculoskeletal: Positive for joint swelling.  Skin: Positive for rash.  Allergic/Immunologic: Negative.   Neurological: Negative.   Hematological: Negative.   Psychiatric/Behavioral: Negative.   All other systems reviewed and are  negative.   Social History   Tobacco Use  . Smoking status: Never Smoker  . Smokeless tobacco: Never Used  Substance Use Topics  . Alcohol use: Yes    Alcohol/week: 1.0 standard drinks    Types: 1 Cans of beer per week    Comment: occasional beer, once a year      Objective:   BP 100/63 (BP Location: Right Arm, Patient Position: Sitting, Cuff Size: Normal)   Pulse 83   Temp 97.7 F (36.5 C) (Oral)   Resp 18   Wt 132 lb 3.2 oz (60 kg)   SpO2 94%   BMI 25.82 kg/m  Vitals:   04/13/19 1022  BP: 100/63  Pulse: 83  Resp: 18  Temp: 97.7 F (36.5 C)  TempSrc: Oral  SpO2: 94%  Weight: 132 lb 3.2 oz (60 kg)     Physical Exam Vitals signs reviewed.  Constitutional:      Appearance: She is not toxic-appearing.  HENT:     Head: Normocephalic and atraumatic.     Right Ear: External ear normal.     Left Ear: External ear normal.  Eyes:     General: No scleral icterus.    Conjunctiva/sclera: Conjunctivae normal.  Cardiovascular:     Rate and Rhythm: Normal rate and regular rhythm.     Heart sounds: Normal heart sounds.  Pulmonary:     Effort: Pulmonary effort is normal.     Breath sounds: Normal breath sounds.  Abdominal:     Palpations: Abdomen is soft.  Musculoskeletal:     Right lower leg: Edema present.     Left lower leg: Edema present.  Skin:    General: Skin is warm and dry.     Findings: Erythema and rash present.     Comments: Mildly erythematous and indurated skin of right greater than left lower leg.The induration is more striking than erythema and there is no tenderness or drainage.  Neurological:     General: No focal deficit present.     Mental Status: She is alert and oriented to person, place, and time. Mental status is at baseline.  Psychiatric:        Mood and Affect: Mood normal.        Behavior: Behavior normal.        Thought Content: Thought content normal.        Judgment: Judgment normal.         Assessment & Plan    1. Pneumonia  due to infectious organism, unspecified laterality, unspecified part of lung Repeat CXR. - DG Chest 2 View; Future - doxycycline (VIBRA-TABS) 100 MG tablet; Take 1 tablet (100 mg total) by mouth 2 (two) times daily.  Dispense: 14 tablet; Refill: 0  2. Essential hypertension   3. Ascending aorta dilatation (HCC)   4. Multifocal pneumonia   5. Bronchiectasis without acute exacerbation ([place])  6. Postoperative hypothyroidism   7. Papillary carcinoma of thyroid ([place])   8. Cellulitis of right leg Refill Doxy for 1 week. Advised to elevate legs.   9. Cellulitis of right lower extremity   10. Failure to thrive in adult Pt starting to fail--palliative care.  11. Do not resuscitate status      [name], MD  [facility]

## 2019-04-14 ENCOUNTER — Other Ambulatory Visit: Payer: Medicare Other

## 2019-04-14 ENCOUNTER — Telehealth: Payer: Self-pay

## 2019-04-14 DIAGNOSIS — Z515 Encounter for palliative care: Secondary | ICD-10-CM

## 2019-04-14 NOTE — Telephone Encounter (Signed)
Patient advised as below.  

## 2019-04-14 NOTE — Telephone Encounter (Signed)
-----   Message from Jerrol Banana., MD sent at 04/14/2019 10:00 AM EDT ----- CR stable--improved.

## 2019-04-14 NOTE — Progress Notes (Signed)
COMMUNITY PALLIATIVE CARE SW NOTE  PATIENT NAME: Joyce [name] DOB: [birth year] MRN: [id number]  PRIMARY CARE PROVIDER: Jerrol Banana., MD  RESPONSIBLE PARTY:  Acct ID - Guarantor Home Phone Work Phone Relationship Acct Type  [id number] REET, [phone]  Self P/F     [address], [place], [place] [address]     PLAN OF CARE and INTERVENTIONS:             1. GOALS OF CARE/ ADVANCE CARE PLANNING:  Goal is to remain at home as long as possible. Patient's daughter is assisting with completing HCPOA/living will form. Patient is a DNR, form is in the home. 2. SOCIAL/EMOTIONAL/SPIRITUAL ASSESSMENT/ INTERVENTIONS:  SW and RN met with patient, [name] (daughter) and Coralyn Mark (son-in-law) via [facility] visit. Patient was resting during visit. Patient continues to have concerns for cellulitis in her legs. Patient has had increased discomfort and "aches" in her legs in the past two weeks. [name] is concerned for sores and is watching her legs closely. Patient is sleeping fine, sleeps in her recliner. Patient continues to get up and walk with her walker. No concerns for appetite, [name] noted that patient has gained weight. Per [name], patient is doing "fine" overall.  3. PATIENT/CAREGIVER EDUCATION/ COPING:  Patient and [name] are "frustrated" with patient's treatment course for her cellulitis. Patient does have some periods of anxiety but is able to voice this to [facility] and takes PRN meds. [name] also provides reassurance.  4. PERSONAL EMERGENCY PLAN:  Family will call 9-1-1 for emergencies. Patient also has a LifeAlert necklace. Due to COVID-19 crisis, family verbalized that she is remaining at home. 5. COMMUNITY RESOURCES COORDINATION/ HEALTH CARE NAVIGATION:  Patient had a PCP visit yesterday to discuss cellulitis, restarted antibiotics. RN and SW to request NP visit to assist with care plan recommendations.  6. FINANCIAL/LEGAL CONCERNS/INTERVENTIONS:  None.     SOCIAL HX:  Social History    Tobacco Use  . Smoking status: Never Smoker  . Smokeless tobacco: Never Used  Substance Use Topics  . Alcohol use: Yes    Alcohol/week: 1.0 standard drinks    Types: 1 Cans of beer per week    Comment: occasional beer, once a year    CODE STATUS:   Code Status: Prior  ADVANCED DIRECTIVES: N MOST FORM COMPLETE:  No HOSPICE EDUCATION PROVIDED: None at this time.  PPS: Patient needs some assistance with tolieting and bathing. Patient is able to feed herself and prepare small meals. Patient continues to ambulate with walker.  Due to the COVID-19 crisis, this visit was done viaZoomfrom my office and it was initiated and consent by this patient and/or family.This was a scheduled visit.  I spent71mnutes with patient/family, from 2:00-2:45pproviding education, support and consultation.   WMargaretmary Lombard LCSW

## 2019-04-14 NOTE — Progress Notes (Signed)
PATIENT NAME: Joyce [name] DOB: [birth year] MRN: [id number]  PRIMARY CARE PROVIDER: Jerrol Banana., MD  RESPONSIBLE PARTY:  Acct ID - Guarantor Home Phone Work Phone Relationship Acct Type  [id number] Joyce, [phone]  Self P/F     [address], [place], [place] [address]    PLAN OF CARE and INTERVENTIONS:               1.  GOALS OF CARE/ ADVANCE CARE PLANNING:  Remain at home with her [name] and son in law.  Patient would like to have cellulitis cleared.                2.  PATIENT/CAREGIVER EDUCATION:  Education to keep LE's elevated to control edema.  Reassurance and support                3.  DISEASE STATUS: Patient is a 85 year old patient who resides in home with her daughter and son in law. Due to the COVID-19 crisis, this visit was done via telemedicine from my office and it was initiated and consent by this patient and or family. Daughter reports patient saw MD yesterday.  Daughter states patient has cellulitis in her legs again and was restarted on Doxycycline again.  Daughter states oral antibiotic will help with infection in leg then after 2-3 days of stopping antibiotic cellulitis returns.  Daughter asking of patient would benefir from round of IV antibiotics "to completely get rid of cellulitis."  Daughter states she feels like MD feel patient is elderly and provide little education on what to do or expect from cellulitis.  Daughter open to telehealth visit from [place] NP.  RN emailed [name] Gusler NP of daughters request.  Daughter reports patient is ambulating well with her walker. Patient has not suffered any falls.  Patient's appetite is good and patient enjoys eating sweets.  Patient also drinks Boost per daughter.  Patient continues to have shortness of breath and cough.  Daughter reports MD ordered X-Ray to rule out pneumonia.  Patient continues to sleep in her chair and naps frequently during the day.  Education to daughter to monitor patient's  lower extremities for increased redness, swelling, warmth and fever.  Daughter reports patient gets anxious at times and has Lorazepam for anxiety.  Support provided to daughter in managing patient's care.  Daughter encouraged to call with questions or concerns.              HISTORY OF PRESENT ILLNESS:    CODE STATUS: DNR  ADVANCED DIRECTIVES: N MOST FORM: No PPS: 50%   PHYSICAL EXAM:   VITALS: Telehealth visit no vital signs taken  LUNGS: Shortness of breath on exertion/ cough takes neb treatments and Advair  CARDIAC: EXTREMITIES:CUrrently being trated for cellulitis of RLE  SKIN: Skin color, texture, turgor normal. No rashes or lesions  NEURO: Alert and oriented. Forgetful       [name], RN

## 2019-04-21 ENCOUNTER — Telehealth: Payer: Self-pay | Admitting: Primary Care

## 2019-04-21 NOTE — Telephone Encounter (Signed)
T/c today to family to set up palliative NP visit as per family request . Daughter declined my telemedicine for today and asks  to set up a time week of 04/26/2019. I asked her to obtain a video platform for her smart phone as she wanted to have a physical exam.  She will investigate and be ready for the call. Please schedule with them next week.

## 2019-04-22 ENCOUNTER — Telehealth: Payer: Self-pay | Admitting: Nurse Practitioner

## 2019-04-28 ENCOUNTER — Encounter: Payer: Self-pay | Admitting: Family Medicine

## 2019-04-28 NOTE — Telephone Encounter (Signed)
I spoke with Ms. Joyce [name].  She states she will not be able to get Ms. Joyce [name] here this afternoon.  I advised her of her appointment tomorrow at 2pm.    Thanks,   -Mickel Baas

## 2019-04-29 ENCOUNTER — Ambulatory Visit (INDEPENDENT_AMBULATORY_CARE_PROVIDER_SITE_OTHER): Payer: Medicare Other | Admitting: Physician Assistant

## 2019-04-29 ENCOUNTER — Encounter: Payer: Self-pay | Admitting: Physician Assistant

## 2019-04-29 ENCOUNTER — Telehealth: Payer: Self-pay | Admitting: Nurse Practitioner

## 2019-04-29 ENCOUNTER — Other Ambulatory Visit: Payer: Self-pay

## 2019-04-29 ENCOUNTER — Ambulatory Visit: Payer: Medicare Other | Admitting: Family Medicine

## 2019-04-29 VITALS — BP 136/78 | HR 102 | Temp 98.6°F | Wt 130.4 lb

## 2019-04-29 DIAGNOSIS — I890 Lymphedema, not elsewhere classified: Secondary | ICD-10-CM | POA: Diagnosis not present

## 2019-04-29 DIAGNOSIS — L03119 Cellulitis of unspecified part of limb: Secondary | ICD-10-CM

## 2019-04-29 NOTE — Progress Notes (Signed)
Patient: Joyce [name] Female    DOB: 1928-08-29   85 y.o.   MRN: [id number] Visit Date: 04/29/2019  Today's Provider: Mar Daring, PA-C   Chief Complaint  Patient presents with  . Cellulitis   Subjective:     HPI Cellulitis: Patient presents today for a follow up. Last OV on 04/13/2019 she started Doxycycline. Patient reports good compliance with treatment plan. She states symptoms are unchanged. She is still having swelling in the lower extremities bilaterally, redness and serous drainage.   No Known Allergies   Current Outpatient Medications:  .  acetaminophen (TYLENOL) 325 MG tablet, Take 650 mg by mouth every 6 (six) hours as needed. Taking two tablets every morning., Disp: , Rfl:  .  acidophilus (RISAQUAD) CAPS capsule, Take 1 capsule by mouth daily., Disp: , Rfl:  .  albuterol (PROVENTIL HFA;VENTOLIN HFA) 108 (90 Base) MCG/ACT inhaler, Inhale 1-2 puffs into the lungs every 4 (four) hours as needed for wheezing or shortness of breath., Disp: 1 Inhaler, Rfl: 0 .  alendronate (FOSAMAX) 70 MG tablet, Take 1 tablet (70 mg total) by mouth once a week. Take with a full glass of water on an empty stomach., Disp: 4 tablet, Rfl: 12 .  atorvastatin (LIPITOR) 10 MG tablet, Take 1 tablet (10 mg total) by mouth daily., Disp: 90 tablet, Rfl: 3 .  calcium carbonate (TUMS - DOSED IN MG ELEMENTAL CALCIUM) 500 MG chewable tablet, Chew 2 tablets by mouth daily. , Disp: , Rfl:  .  cholecalciferol (VITAMIN D) 1000 units tablet, Take 2,000 Units by mouth daily., Disp: , Rfl:  .  collagenase (SANTYL) ointment, Apply topically daily., Disp: 15 g, Rfl: 0 .  diltiazem (DILACOR XR) 120 MG 24 hr capsule, Take 1 capsule (120 mg total) by mouth daily. Take 2 capsules by mouth every morning, and 1 capsule every evening. (Patient taking differently: Take 120 mg by mouth as directed. Take 2 capsules by mouth every morning, and 1 capsule every evening.), Disp: 30 capsule, Rfl: 1 .  feeding  supplement, ENSURE ENLIVE, (ENSURE ENLIVE) LIQD, Take 237 mLs by mouth 2 (two) times daily between meals., Disp: 237 mL, Rfl: 12 .  Fluticasone-Salmeterol (ADVAIR) 500-50 MCG/DOSE AEPB, Inhale 1 puff into the lungs 2 (two) times daily. , Disp: , Rfl:  .  levothyroxine (SYNTHROID, LEVOTHROID) 150 MCG tablet, Take 1 tablet (150 mcg total) by mouth daily before breakfast., Disp: 30 tablet, Rfl: 12 .  LORazepam (ATIVAN) 0.5 MG tablet, Take 0.5 tablets (0.25 mg total) by mouth 3 (three) times daily as needed for anxiety., Disp: 90 tablet, Rfl: 3 .  mirtazapine (REMERON) 15 MG tablet, Take 1 tablet (15 mg total) by mouth at bedtime., Disp: 30 tablet, Rfl: 12 .  Multiple Vitamin (MULTIVITAMIN WITH MINERALS) TABS tablet, Take 1 tablet by mouth daily., Disp: , Rfl:  .  albuterol (PROVENTIL) (2.5 MG/3ML) 0.083% nebulizer solution, Take 3 mLs (2.5 mg total) by nebulization every 6 (six) hours as needed for wheezing or shortness of breath., Disp: 75 mL, Rfl: 12  Review of Systems  Constitutional: Negative.   Respiratory: Negative.   Cardiovascular: Positive for leg swelling.  Musculoskeletal: Negative.   Skin: Positive for color change.       Cellulitis     Social History   Tobacco Use  . Smoking status: Never Smoker  . Smokeless tobacco: Never Used  Substance Use Topics  . Alcohol use: Yes    Alcohol/week: 1.0 standard drinks  Types: 1 Cans of beer per week    Comment: occasional beer, once a year      Objective:   BP 136/78 (BP Location: Left Arm, Patient Position: Sitting, Cuff Size: Normal)   Pulse (!) 102   Temp 98.6 F (37 C) (Oral)   Wt 130 lb 6.4 oz (59.1 kg)   BMI 25.47 kg/m  Vitals:   04/29/19 1437  BP: 136/78  Pulse: (!) 102  Temp: 98.6 F (37 C)  TempSrc: Oral  Weight: 130 lb 6.4 oz (59.1 kg)     Physical Exam Vitals signs reviewed.  Constitutional:      General: She is not in acute distress.    Appearance: Normal appearance. She is well-developed. She is obese.  She is not ill-appearing or diaphoretic.  Neck:     Musculoskeletal: Normal range of motion and neck supple.     Thyroid: No thyromegaly.     Vascular: No JVD.     Trachea: No tracheal deviation.  Cardiovascular:     Rate and Rhythm: Normal rate and regular rhythm.     Pulses: Normal pulses.     Heart sounds: Normal heart sounds. No murmur. No friction rub. No gallop.   Pulmonary:     Effort: Pulmonary effort is normal. No respiratory distress.     Breath sounds: Normal breath sounds. No wheezing or rales.  Musculoskeletal:     Right lower leg: Edema present.     Left lower leg: Edema present.  Lymphadenopathy:     Cervical: No cervical adenopathy.  Skin:    Capillary Refill: Capillary refill takes less than 2 seconds.     Findings: Erythema present.  Neurological:     Mental Status: She is alert.     No results found for any visits on 04/29/19.     Assessment & Plan    1. Cellulitis of lower extremity, unspecified laterality Unna boots applied bilaterally. Continue doxycycline. Return in one week for removal.  - Unna boot  2. Lymphedema See above medical treatment plan. Louretta Parma boot     Mar Daring, PA-C  [facility]

## 2019-04-29 NOTE — Telephone Encounter (Signed)
Called patient's daughter to schedule f/u visit for Palliative, someone picked up the phone but never answered and hung up. Will try again later.

## 2019-04-29 NOTE — Telephone Encounter (Signed)
Called patient's [name] to attempt to schedule a f/u visit, no answer.  Left message with reason for call along with my name and contact number.

## 2019-05-04 ENCOUNTER — Telehealth: Payer: Self-pay

## 2019-05-04 NOTE — Chronic Care Management (AMB) (Signed)
  Chronic Care Management   Note  05/04/2019-LATE ENTRY Name: Joyce [name] MRN: [id number] DOB: 1928-07-05  Care Coordination only   Per pharmacist review of [name]'s EMR, [name] has had multiple rounds of antibiotics from February 2020, mostly monotherapy of clindamycin 300mg  TID x 10 days. Patient did have Vanc and Rocephin, as well as Keflex in March/April of 2020.   Based on my EMR review and review of local antibiogram for [facility] [place], she hasn't had good coverage for  MRSA. Recommends Doxy + Bactrim for treatment of recurrent cellulitis, 10 day duration   Follow up plan: No further follow up required: Care coordination only  Ruben Reason, PharmD Clinical Pharmacist [place] [phone]

## 2019-05-04 NOTE — Telephone Encounter (Signed)
Telephone call to patient's [name] to schedule home visit per daughters request.  Daughter in agreement with visit on Friday 05-07-19 at 10:00 AM.

## 2019-05-05 ENCOUNTER — Other Ambulatory Visit: Payer: Self-pay

## 2019-05-05 ENCOUNTER — Encounter: Payer: Self-pay | Admitting: Physician Assistant

## 2019-05-05 ENCOUNTER — Ambulatory Visit (INDEPENDENT_AMBULATORY_CARE_PROVIDER_SITE_OTHER): Payer: Medicare Other | Admitting: Physician Assistant

## 2019-05-05 VITALS — BP 113/63 | HR 93 | Temp 98.0°F | Resp 16

## 2019-05-05 DIAGNOSIS — L89150 Pressure ulcer of sacral region, unstageable: Secondary | ICD-10-CM | POA: Diagnosis not present

## 2019-05-05 DIAGNOSIS — L03119 Cellulitis of unspecified part of limb: Secondary | ICD-10-CM

## 2019-05-05 MED ORDER — DOXYCYCLINE HYCLATE 100 MG PO TABS: 100.0000 mg | ORAL_TABLET | Freq: Two times a day (BID) | ORAL | 0 refills | Status: DC

## 2019-05-05 NOTE — Progress Notes (Signed)
Patient: Joyce [name] Female    DOB: 04-Mar-1928   85 y.o.   MRN: [id number] Visit Date: 05/05/2019  Today's Provider: Mar Daring, PA-C   Chief Complaint  Patient presents with  . Follow-up    wound   Subjective:    I,Joseline E. Rosas,RMA am acting as a Education administrator for Mar Daring, PA-C.  HPI  Patient here today to recheck wound.Patient was seen for Cellulitis of lower extremity on 5 days ago. Unna boot applied last week and  she is here for removal.  Patient also c/o with a sore on her bottom. Daughter reports her mother mentioned her bottom was becoming more sore and she noticed an open wound. The daughter reports she does not feel comfortable changing this dressing.   No Known Allergies   Current Outpatient Medications:  .  acetaminophen (TYLENOL) 325 MG tablet, Take 650 mg by mouth every 6 (six) hours as needed. Taking two tablets every morning., Disp: , Rfl:  .  acidophilus (RISAQUAD) CAPS capsule, Take 1 capsule by mouth daily., Disp: , Rfl:  .  albuterol (PROVENTIL HFA;VENTOLIN HFA) 108 (90 Base) MCG/ACT inhaler, Inhale 1-2 puffs into the lungs every 4 (four) hours as needed for wheezing or shortness of breath., Disp: 1 Inhaler, Rfl: 0 .  alendronate (FOSAMAX) 70 MG tablet, Take 1 tablet (70 mg total) by mouth once a week. Take with a full glass of water on an empty stomach., Disp: 4 tablet, Rfl: 12 .  atorvastatin (LIPITOR) 10 MG tablet, Take 1 tablet (10 mg total) by mouth daily., Disp: 90 tablet, Rfl: 3 .  calcium carbonate (TUMS - DOSED IN MG ELEMENTAL CALCIUM) 500 MG chewable tablet, Chew 2 tablets by mouth daily. , Disp: , Rfl:  .  cholecalciferol (VITAMIN D) 1000 units tablet, Take 2,000 Units by mouth daily., Disp: , Rfl:  .  collagenase (SANTYL) ointment, Apply topically daily., Disp: 15 g, Rfl: 0 .  diltiazem (DILACOR XR) 120 MG 24 hr capsule, Take 1 capsule (120 mg total) by mouth daily. Take 2 capsules by mouth every morning, and 1 capsule every  evening. (Patient taking differently: Take 120 mg by mouth as directed. Take 2 capsules by mouth every morning, and 1 capsule every evening.), Disp: 30 capsule, Rfl: 1 .  Fluticasone-Salmeterol (ADVAIR) 500-50 MCG/DOSE AEPB, Inhale 1 puff into the lungs 2 (two) times daily. , Disp: , Rfl:  .  levothyroxine (SYNTHROID, LEVOTHROID) 150 MCG tablet, Take 1 tablet (150 mcg total) by mouth daily before breakfast., Disp: 30 tablet, Rfl: 12 .  LORazepam (ATIVAN) 0.5 MG tablet, Take 0.5 tablets (0.25 mg total) by mouth 3 (three) times daily as needed for anxiety., Disp: 90 tablet, Rfl: 3 .  mirtazapine (REMERON) 15 MG tablet, Take 1 tablet (15 mg total) by mouth at bedtime., Disp: 30 tablet, Rfl: 12 .  Multiple Vitamin (MULTIVITAMIN WITH MINERALS) TABS tablet, Take 1 tablet by mouth daily., Disp: , Rfl:  .  albuterol (PROVENTIL) (2.5 MG/3ML) 0.083% nebulizer solution, Take 3 mLs (2.5 mg total) by nebulization every 6 (six) hours as needed for wheezing or shortness of breath., Disp: 75 mL, Rfl: 12 .  feeding supplement, ENSURE ENLIVE, (ENSURE ENLIVE) LIQD, Take 237 mLs by mouth 2 (two) times daily between meals., Disp: 237 mL, Rfl: 12  Review of Systems  Constitutional: Negative.   Eyes: Negative for visual disturbance.  Respiratory: Negative for chest tightness.   Cardiovascular: Positive for leg swelling. Negative for chest  pain.  Skin: Positive for wound.  Neurological: Negative for dizziness and headaches.    Social History   Tobacco Use  . Smoking status: Never Smoker  . Smokeless tobacco: Never Used  Substance Use Topics  . Alcohol use: Yes    Alcohol/week: 1.0 standard drinks    Types: 1 Cans of beer per week    Comment: occasional beer, once a year      Objective:   BP 113/63 (BP Location: Left Arm, Patient Position: Sitting, Cuff Size: Normal)   Pulse 93   Temp 98 F (36.7 C) (Oral)   Resp 16  Vitals:   05/05/19 1327  BP: 113/63  Pulse: 93  Resp: 16  Temp: 98 F (36.7 C)   TempSrc: Oral     Physical Exam Vitals signs reviewed.  Constitutional:      General: She is not in acute distress.    Appearance: Normal appearance. She is well-developed. She is obese. She is not ill-appearing or diaphoretic.  HENT:     Head: Normocephalic and atraumatic.  Neck:     Musculoskeletal: Normal range of motion and neck supple.     Thyroid: No thyromegaly.     Vascular: No JVD.     Trachea: No tracheal deviation.  Cardiovascular:     Rate and Rhythm: Normal rate and regular rhythm.     Heart sounds: Normal heart sounds. No murmur. No friction rub. No gallop.   Pulmonary:     Effort: Pulmonary effort is normal. No respiratory distress.     Breath sounds: Normal breath sounds. No wheezing or rales.  Musculoskeletal:     Right lower leg: Edema present.     Left lower leg: Edema present.  Lymphadenopathy:     Cervical: No cervical adenopathy.  Skin:      Neurological:     Mental Status: She is alert.      No results found for any visits on 05/05/19.     Assessment & Plan    1. Cellulitis of lower extremity, unspecified laterality Worsening and did not respond to [facility] boot treatments. Will give doxycycline as below for continued cellulitis. She has palliative appointment at home on Friday and will most likely be referred to wound care pending what services could be offered and agreed to for her. Call if worsening.  - doxycycline (VIBRA-TABS) 100 MG tablet; Take 1 tablet (100 mg total) by mouth 2 (two) times daily.  Dispense: 20 tablet; Refill: 0  2. Pressure injury of sacral region, unstageable ([place]) Unstageable due to patient position at time of exam. Wound care consult will be appreciated. May require imaging to make sure no osteomyelitis of the spine depending on depth of wound.  - doxycycline (VIBRA-TABS) 100 MG tablet; Take 1 tablet (100 mg total) by mouth 2 (two) times daily.  Dispense: 20 tablet; Refill: 0     Mar Daring, PA-C  [facility]

## 2019-05-05 NOTE — Patient Instructions (Signed)
Pressure Injury  A pressure injury is damage to the skin and underlying tissue that results from pressure being applied to an area of the body. It often affects people who must spend a long time in a bed or chair because of a medical condition. Pressure injuries usually occur:  Over bony parts of the body, such as the tailbone, shoulders, elbows, hips, heels, spine, ankles, and back of the head.  Under medical devices that make contact with the body, such as respiratory equipment, stockings, tubes, and splints. Pressure injuries start as reddened areas on the skin and can lead to pain and an open wound. What are the causes? This condition is caused by frequent or constant pressure to an area of the body. Decreased blood flow to the skin can eventually cause the skin tissue to die and break down, causing a wound. What increases the risk? You are more likely to develop this condition if you:  Are in the hospital or an extended care facility.  Are bedridden or in a wheelchair.  Have an injury or disease that keeps you from: ? Moving normally. ? Feeling pain or pressure.  Have a condition that: ? Makes you sleepy or less alert. ? Causes poor blood flow.  Need to wear a medical device.  Have poor control of your bladder or bowel functions (incontinence).  Have poor nutrition (malnutrition). If you are at risk for pressure injuries, your health care provider may recommend certain types of mattresses, mattress covers, pillows, cushions, or boots to help prevent them. These may include products filled with air, foam, gel, or sand. What are the signs or symptoms? Symptoms of this condition depend on the severity of the injury. Symptoms may include:  Red or dark areas of the skin.  Pain, warmth, or a change of skin texture.  Blisters.  An open wound. How is this diagnosed? This condition is diagnosed with a medical history and physical exam. You may also have tests, such as:  Blood  tests.  Imaging tests.  Blood flow tests. Your pressure injury will be staged based on its severity. Staging is based on:  The depth of the tissue injury, including whether there is exposure of muscle, bone, or tendon.  The cause of the pressure injury. How is this treated? This condition may be treated by:  Relieving or redistributing pressure on your skin. This includes: ? Frequently changing your position. ? Avoiding positions that caused the wound or that can make the wound worse. ? Using specific bed mattresses, chair cushions, or protective boots. ? Moving medical devices from an area of pressure, or placing padding between the skin and the device. ? Using foams, creams, or powders to prevent rubbing (friction) on the skin.  Keeping your skin clean and dry. This may include using a skin cleanser or skin barrier as told by your health care provider.  Cleaning your injury and removing any dead tissue from the wound (debridement).  Placing a bandage (dressing) over your injury.  Using medicines for pain or to prevent or treat infection. Surgery may be needed if other treatments are not working or if your injury is very deep. Follow these instructions at home: Wound care  Follow instructions from your health care provider about how to take care of your wound. Make sure you: ? Wash your hands with soap and water before and after you change your bandage (dressing). If soap and water are not available, use hand sanitizer. ? Change your dressing as told   by your health care provider.  Check your wound every day for signs of infection. Have a caregiver do this for you if you are not able. Check for: ? Redness, swelling, or increased pain. ? More fluid or blood. ? Warmth. ? Pus or a bad smell. Skin care  Keep your skin clean and dry. Gently pat your skin dry.  Do not rub or massage your skin.  You or a caregiver should check your skin every day for any changes in color or  any new blisters or sores (ulcers). Medicines  Take over-the-counter and prescription medicines only as told by your health care provider.  If you were prescribed an antibiotic medicine, take or apply it as told by your health care provider. Do not stop using the antibiotic even if your condition improves. Reducing and redistributing pressure  Do not lie or sit in one position for a long time. Move or change position every 1-2 hours, or as told by your health care provider.  Use pillows or cushions to reduce pressure. Ask your health care provider to recommend cushions or pads for you. General instructions   Eat a healthy diet that includes lots of protein.  Drink enough fluid to keep your urine pale yellow.  Be as active as you can every day. Ask your health care provider to suggest safe exercises or activities.  Do not abuse drugs or alcohol.  Do not use any products that contain nicotine or tobacco, such as cigarettes, e-cigarettes, and chewing tobacco. If you need help quitting, ask your health care provider.  Keep all follow-up visits as told by your health care provider. This is important. Contact a health care provider if:  You have: ? A fever or chills. ? Pain that is not helped by medicine. ? Any changes in skin color. ? New blisters or sores. ? Pus or a bad smell coming from your wound. ? Redness, swelling, or pain around your wound. ? More fluid or blood coming from your wound.  Your wound does not improve after 1-2 weeks of treatment. Summary  A pressure injury is damage to the skin and underlying tissue that results from pressure being applied to an area of the body.  Do not lie or sit in one position for a long time. Your health care provider may advise you to move or change position every 1-2 hours.  Follow instructions from your health care provider about how to take care of your wound.  Keep all follow-up visits as told by your health care provider. This  is important. This information is not intended to replace advice given to you by your health care provider. Make sure you discuss any questions you have with your health care provider. Document Released: 10/21/2005 Document Revised: 05/20/2018 Document Reviewed: 05/20/2018 Elsevier Patient Education  [place]. Cellulitis, Adult  Cellulitis is a skin infection. The infected area is usually warm, red, swollen, and tender. This condition occurs most often in the arms and lower legs. The infection can travel to the muscles, blood, and underlying tissue and become serious. It is very important to get treated for this condition. What are the causes? Cellulitis is caused by bacteria. The bacteria enter through a break in the skin, such as a cut, burn, insect bite, open sore, or crack. What increases the risk? This condition is more likely to occur in people who:  Have a weak body defense system (immune system).  Have open wounds on the skin, such as cuts,  burns, bites, and scrapes. Bacteria can enter the body through these open wounds.  Are older than 83 years of age.  Have diabetes.  Have a type of long-lasting (chronic) liver disease (cirrhosis) or kidney disease.  Are obese.  Have a skin condition such as: ? Itchy rash (eczema). ? Slow movement of blood in the veins (venous stasis). ? Fluid buildup below the skin (edema).  Have had radiation therapy.  Use IV drugs. What are the signs or symptoms? Symptoms of this condition include:  Redness, streaking, or spotting on the skin.  Swollen area of the skin.  Tenderness or pain when an area of the skin is touched.  Warm skin.  A fever.  Chills.  Blisters. How is this diagnosed? This condition is diagnosed based on a medical history and physical exam. You may also have tests, including:  Blood tests.  Imaging tests. How is this treated? Treatment for this condition may include:  Medicines, such as antibiotic  medicines or medicines to treat allergies (antihistamines).  Supportive care, such as rest and application of cold or warm cloths (compresses) to the skin.  Hospital care, if the condition is severe. The infection usually starts to get better within 1-2 days of treatment. Follow these instructions at home:  Medicines  Take over-the-counter and prescription medicines only as told by your health care provider.  If you were prescribed an antibiotic medicine, take it as told by your health care provider. Do not stop taking the antibiotic even if you start to feel better. General instructions  Drink enough fluid to keep your urine pale yellow.  Do not touch or rub the infected area.  Raise (elevate) the infected area above the level of your heart while you are sitting or lying down.  Apply warm or cold compresses to the affected area as told by your health care provider.  Keep all follow-up visits as told by your health care provider. This is important. These visits let your health care provider make sure a more serious infection is not developing. Contact a health care provider if:  You have a fever.  Your symptoms do not begin to improve within 1-2 days of starting treatment.  Your bone or joint underneath the infected area becomes painful after the skin has healed.  Your infection returns in the same area or another area.  You notice a swollen bump in the infected area.  You develop new symptoms.  You have a general ill feeling (malaise) with muscle aches and pains. Get help right away if:  Your symptoms get worse.  You feel very sleepy.  You develop vomiting or diarrhea that persists.  You notice red streaks coming from the infected area.  Your red area gets larger or turns dark in color. These symptoms may represent a serious problem that is an emergency. Do not wait to see if the symptoms will go away. Get medical help right away. Call your local emergency services  (911 in the [place] Do not drive yourself to the hospital. Summary  Cellulitis is a skin infection. This condition occurs most often in the arms and lower legs.  Treatment for this condition may include medicines, such as antibiotic medicines or antihistamines.  Take over-the-counter and prescription medicines only as told by your health care provider. If you were prescribed an antibiotic medicine, do not stop taking the antibiotic even if you start to feel better.  Contact a health care provider if your symptoms do not begin to improve within  1-2 days of starting treatment or your symptoms get worse.  Keep all follow-up visits as told by your health care provider. This is important. These visits let your health care provider make sure that a more serious infection is not developing. This information is not intended to replace advice given to you by your health care provider. Make sure you discuss any questions you have with your health care provider. Document Released: 07/31/2005 Document Revised: 03/12/2018 Document Reviewed: 03/12/2018 Elsevier Patient Education  2020 [employer].

## 2019-05-07 ENCOUNTER — Other Ambulatory Visit: Payer: Medicare Other

## 2019-05-07 ENCOUNTER — Other Ambulatory Visit: Payer: Self-pay

## 2019-05-07 DIAGNOSIS — Z515 Encounter for palliative care: Secondary | ICD-10-CM

## 2019-05-07 NOTE — Progress Notes (Signed)
COMMUNITY PALLIATIVE CARE SW NOTE  PATIENT NAME: Joyce [name] DOB: 01/25/1928 MRN: [id number]  PRIMARY CARE PROVIDER: Jerrol Banana., MD  RESPONSIBLE PARTY:  Acct ID - Guarantor Home Phone Work Phone Relationship Acct Type  [id number] ELMO, [phone]  Self P/F     [address], [place], [place] [address]     PLAN OF CARE and INTERVENTIONS:             1. GOALS OF CARE/ ADVANCE CARE PLANNING:  Goal is for patient to remain at home with family as long as possible. Patient is a DNR, form is in the home. Daughter said she had forms for HCPOA/Living Will but needs notary, SW to follow-up.  2. SOCIAL/EMOTIONAL/SPIRITUAL ASSESSMENT/ INTERVENTIONS:  SW and RN met with patient, [name] (daughter) and Coralyn Mark (son-in-law) in the home. Patient was sitting up in rocking chair, reports doing "good". Patient was engaged, talkative. Patient denies pain, but continues to have concerns for cellulitis in her legs. [name] expressed concerns for ongoing antibiotics and lack of improvement. Patient started back on doxycycline again this week. Patient also has a wound in sacral region, RN assessed.  Patient and [name] gave team a tour of the patient's room and showed the recliner that she sleeps in. Team suggested a hospital bed to help with elevating feet and with pressure on her bottom, patient and [name] to discuss. 3. PATIENT/CAREGIVER EDUCATION/ COPING:  Patient seemed to be coping well. Patient expressed that although she is frustrated with chronic cellulitis, she is grateful to be doing as well as she is at her age. [name] seemed to be overwhelmed at times during conversation, and expressed concerns for providing the personal care that patient may need. [name] also noted that she is glad patient is with her and doing "good" overall but does have care concerns. Education about palliative care, home health, wound care, in-home services, etc. provided to patient and family.  4. PERSONAL EMERGENCY PLAN:   Family will call 9-1-1 for emergencies. Patient also has a LifeAlert bracelet.Due to COVID-19 crisis,familyverbalized thatshe is remaining at home other than appointments. 5. COMMUNITY RESOURCES COORDINATION/ HEALTH CARE NAVIGATION:  Daughter coordinates patient's care. Patient saw PCP on Wednesday and is scheduled to go back in mid-August. Team has discussion of possible referral to wound care clinic, team to consult NP and request visit. SW and RN also discussed the possible need to hire a caregiver to help with wound care. Coralyn Mark notes that he has local nurse friends that could possible help.  6. FINANCIAL/LEGAL CONCERNS/INTERVENTIONS:  None at this time.      SOCIAL HX:  Social History   Tobacco Use  . Smoking status: Never Smoker  . Smokeless tobacco: Never Used  Substance Use Topics  . Alcohol use: Yes    Alcohol/week: 1.0 standard drinks    Types: 1 Cans of beer per week    Comment: occasional beer, once a year    CODE STATUS:   Code Status: Prior  ADVANCED DIRECTIVES: N MOST FORM COMPLETE:  No. HOSPICE EDUCATION PROVIDED: None.  PPS: Patient is able to feed herself and prepare small meals. Patient needs assistance bathing. Patient continues to ambulate well with her walker.  I spent3mnutes with patient/family, from 10:00-10:45aproviding education, support and consultation.   WMargaretmary Lombard LCSW

## 2019-05-07 NOTE — Progress Notes (Signed)
PATIENT NAME: Joyce [name] DOB: 10/25/1928 MRN: [id number]  PRIMARY CARE PROVIDER: Gilbert, Richard L Jr., MD  RESPONSIBLE PARTY:  Acct ID - Guarantor Home Phone Work Phone Relationship Acct Type  [id number] - Tuohy,RILL* [phone]  Self P/F     [address], [place], [place] [address]    PLAN OF CARE and INTERVENTIONS:               1.  GOALS OF CARE/ ADVANCE CARE PLANNING:  Remain in home with daughter and son in law, patient hopes cellulitis will heal.               2.  PATIENT/CAREGIVER EDUCATION:  Education on fall precautions, education on keeping lower extremities elevated to reduce edema, education on need to resposition frequently and benefit of having a hospital bed rather than sleeping in recliner to aide with sacral decub.  Education to patient and daughter on need for wound care center referral for cellulitis.                   4. PERSONAL EMERGENCY PLAN:  Patient has life line in place.               5.  DISEASE STATUS:  Patient is a 85 year old patient of Dr Gilberts's.  Patient saw  PA Jennifer Burnett this week due to worsening cellulitis in lower extremities (right greater than the left.)  Patient had unna boots applied bilaterally for 1 week.  Unna boots were removed on Wednesday 05-05-19.  Patient was also started on Doxycycline 100 mg 1 tab bid.  Patient has been on Doxycycline multiple times for cellulitis.   SW and RN met with patient and daughter [name].  [name] not comfortable in performing dressing care and has not looked at sacral decub.  Patient is unable to place dressing between sacral folds to stage II decub.  RN assesses area and patient has a 2.54 cm in length by 0.635 cm in width sacral decub.  Area is clean and without redness. RN placed foam dressing with adhesive over area.  Patient denies having any pain at the present time.  Patient reports her lower legs "feel numb."  Patient sitting in rocking chair and does not have lower extremities elevated.  Patient sleeps  sitting up in recliner chair in her room.  Education to patient and daughter on need for patient to keep lower extremities elevated and that decub will not heal if patient does not keep pressure off area.  Patient is able to ambulate with her rollator walker.  Patient has not suffered any recent falls.  Patient's appetite is good and patient denies having any nausea or vomiting.  Patient has shortness of breath on exertion and has a productive cough of clear sputum.  Patient has a nebulizer in the home.  Daughter reports she assists patient with getting her shower. Patient and family would like additional in home help. SW and RN provided education on hiring help privately, home health (which is short term and home health would need to qualify patient with a skilled need.)  SW and RN provided education on hiring a non licensed person to assist patient with care and applying dressing to sacral decub.  SW offered list of agencies to daughter as well.  Daughter would like for NP to assess patient and feels like wound clinic would be the best option for patient before making ay other decisions regarding DME or caregivers.  RN called and did   CoV19 screening (negative) prior to visit.  Daughter appreciative of visit and is aware call will be made to schedule time for NP to visit.  Patient and daughter encouraged to call with questions or concerns.                           HISTORY OF PRESENT ILLNESS:    CODE STATUS: DNR  ADVANCED DIRECTIVES: Y MOST FORM: No PPS: 50%   PHYSICAL EXAM:   VITALS:See vital signs  LUNGS: clear to auscultation  CARDIAC: Cor RRR  EXTREMITIES: 2+ and pitting edema, peripheral pulses  L brachial 2+ R brachial 2+  L radial 2+ R radial 2+  L inguinal 2+ R inguinal 2+  L popliteal 2+ R popliteal 2+  L posterior tibial 2+ R posterior tibial 2+  L dorsalis pedis 2+ R dorsalis pedis 2+   SKIN: Skin color, texture, turgor normal. No rashes or lesions or erythema - lower leg(s)  bilateral  NEURO: positive for weakness       Virgie Jollay, RN 

## 2019-05-10 ENCOUNTER — Telehealth: Payer: Self-pay | Admitting: Nurse Practitioner

## 2019-05-10 ENCOUNTER — Encounter: Payer: Self-pay | Admitting: Family Medicine

## 2019-05-10 ENCOUNTER — Telehealth: Payer: Self-pay

## 2019-05-10 NOTE — Telephone Encounter (Signed)
I called Ms. Burnette PA at Dr Alben Spittle office, following Ms. Sabra Heck for further discussion about case as a NP PC request for f/u visit.

## 2019-05-10 NOTE — Telephone Encounter (Signed)
Corwin Levins Nurse Practioner with Palliative care would like to speak to Fenton Malling about patient Joyce [name]. Altha Harm was advised Tawanna Sat not in the office today and will be in until tomorrow afternoon. She said that was ok she just need to talk to the provider regarding patient.   CB# [phone] Work Number: [phone]

## 2019-05-11 ENCOUNTER — Encounter: Payer: Self-pay | Admitting: Nurse Practitioner

## 2019-05-11 ENCOUNTER — Other Ambulatory Visit: Payer: Self-pay

## 2019-05-11 DIAGNOSIS — L03119 Cellulitis of unspecified part of limb: Secondary | ICD-10-CM

## 2019-05-11 DIAGNOSIS — C73 Malignant neoplasm of thyroid gland: Secondary | ICD-10-CM | POA: Insufficient documentation

## 2019-05-11 NOTE — Telephone Encounter (Signed)
Mes. Gusler with Palliative care called back wanting to speak with [facility] regarding patient.  She has an appt tomorrow and has some questions  CB#  [phone]

## 2019-05-11 NOTE — Telephone Encounter (Signed)
Spoke with Altha Harm, Palliative NP. She is going to talk to patient tomorrow and see if Hospice may be available vs wound care HH. She will call back for [place] to help facilitate.   Dr. Darnell Level, Just FYI message for you.

## 2019-05-12 ENCOUNTER — Other Ambulatory Visit: Payer: Medicare Other | Admitting: Nurse Practitioner

## 2019-05-12 ENCOUNTER — Encounter: Payer: Self-pay | Admitting: Nurse Practitioner

## 2019-05-12 DIAGNOSIS — R413 Other amnesia: Secondary | ICD-10-CM | POA: Diagnosis not present

## 2019-05-12 DIAGNOSIS — R630 Anorexia: Secondary | ICD-10-CM | POA: Diagnosis not present

## 2019-05-12 DIAGNOSIS — Z515 Encounter for palliative care: Secondary | ICD-10-CM | POA: Diagnosis not present

## 2019-05-12 NOTE — Progress Notes (Signed)
[employer] Palliative [name] Consult [name] Telephone: [phone]  Fax: [phone]  PATIENT NAME: Joyce [name] DOB: Jan 16, 1928 MRN: [id number]  PRIMARY [name] PROVIDER:   Jerrol Banana., MD  REFERRING PROVIDER:  Jerrol Banana., MD [address] [place],  [place] [address]  RESPONSIBLE PARTY:  [name] daughter [phone]  Due to the COVID-19 crisis, this visit was done via telemedicine from my office and it was initiated and consent by this patient and or family.  RECOMMENDATIONS and PLAN:  1. Palliative [name] encounter Z51.5; Palliative medicine team will continue to support patient, patient's family, and medical team. Visit consisted of counseling and education dealing with the complex and emotionally intense issues of symptom management and palliative [name] in the setting of serious and potentially life-threatening illness  2. Memory loss R41.3 appears progressive. Medical goals to continue to focus on Comfort, redirecting with supportive measures.  3. Anorexia R63.0 appetite improving over this few weeks. Continue to encourage supplements and comfort feedings. Discuss nutrition  ASSESSMENT:     I called Joyce [name] for schedule palliative [name] telemedicine visit. We talked about the last time Joyce [name] was independent at home which was 9 months ago. She was living independently in [place]. Joyce [name] endorses she felt like as she has schedule palliative [name] telemedicine visit. We talked about the last time Joyce [name] was independent at home which was 9 months ago. She was living independently in [place]. Joyce [name] endorses she felt that with Joyce [name] getting older she needed to be closer to her. Joyce [name] endorses has she moved Joyce [name] to [place] to be close to her. We talked about her functional level at that time as she remained independent up until her multiple hospitalizations. We talked about past medical history in the city of  chronic disease and natural aging. We talked about the recurrent infections including pneumonia, UTIs and cellulitis. We talked about recent episodes of cellulitis with reoccurrence. We talked about antibiotic resistance. We talked about overall decline in health. We talked about her current functional level where she is now gotten back to the point where she can stand and ambulate some distances. Joyce [name], daughter endorses she feels like she is improved a little, maybe turn the corner. We talked about symptoms, appetite which is improving. We talked about sacral wound. Joyce [name] endorses they have an appointment at the wound [name] center in two days, Friday. We talked about medical goals of [name] including aggressive versus conservative versus Comfort [name]. Currently she is a full code. We talked about role of palliative [name]. We talked about Hospice Services being a Medicare program and what is offered. We talked about current wishes to see what the wound [name] center does say about the sacral wound and bilateral lower extremity cellulitis. We talked about her overall clinical condition, recurrent infections in a short amount of time in addition to multiple antibiotic usage, age, recent multiple Falls without noted injury. We talked about being 85 years old, realistic expectations with progression of disease. We revisited the functional changes in the last nine months. We talked about recurrent infections could be an indication that she continues to overall decline. We talked about home health, therapy. Joyce [name] endorses she wants to see what wound [name] center has to offer on Friday and scheduled a follow-up palliative [name] telemedicine visit for one week for further discussion of goals of [name] and will address code status at that time. Joyce [name] endorses she was appreciative  for palliative [name] discussion. Scheduled news palliative [name] visit for one way if needed or sooner should she declined. Joyce [name] in agreement. Therapeutic  listing and emotional support provided. Contact information provided. Questions answered to satisfaction.  2 / 23 / 2020 weight 54.9 kg/121 lbs 3 / 27 / 2020 weight 51 kg/112.4 lbs 04/29/2019 weight 59.1kg/130.6 lbs  I spent 60 minutes providing this consultation,  from 9:00am to 10:00am. More than 50% of the time in this consultation was spent coordinating communication.   HISTORY OF PRESENT ILLNESS:  Joyce [name] is a 85 y.o. year old female with multiple medical problems including 85 year old female with multiple medical problems including COPD, enlarged aorta, breast cancer s/p, thyroid cancer s/p thyroidectomy, peptic ulcer disease, hypertension, GERD, asthma, anxiety. Severe kyphosis  She lived in [place] independently nine months ago. Her daughter moved her to [place] to be closer to her. She stopped driving about a year ago. After she moved to [place] she started having multiple hospitalizations. 2 / 20 / 2020 to 2 / 23 / 2020 hospitalized for shortness of breath recently treated for UTI found a have multifocal pneumonia, COPD exacerbation. She did require IV antibiotics. Hyponatremia chronic in nature with sodium improve from 126 to 134 upon discharge with IV fluids. Hospitalized 3 / 15 / 2020 to 3 / 17 / 2020 hospitalized for coarsening right leg cellulitis for which primary provider have been treating out patient with Amoxicillin. Cellulitis reoccuring with right lower extremity edema. Ultrasound negative for diabetes. Recommendation from wound consult dressing with santyl and no compression therapy until cellulitis is resolved. She was discharged to her daughter's home. She was readmitted 3 / 27 / 2020 for lower extremity edema swelling recurrent Falls, worsening cough and congestion. Acute right sided recurrent pneumonia concern for aspiration treated with antibiotic therapy. Acute left lower extremity cellulitis resolving negative for DVT. Acute hypotension with a history of  hypertension resolved when discontinued lisinopril. Cardizem was continued. Severe protein calorie malnutrition continued meal supplement. She was discharged home with her daughter, Joyce [name] where she currently resides. Per Joyce [name] the [name] she has a recliner existence/sleeps in recliner.  Per Joyce [name] noted ambulates with a rolling walker but Joyce [name] [name] with me that she could hardly get into the office she was so weak, walking and required wheelchair. Joyce [name] appetite was good but Joyce [name] the [name] [name] her appetite was poor. Weight appears to have been relatively stable. Unna boots for applied and antibiotic therapy intermittent. She did not tolerate the unna boots. Sacral wound measured by Joyce [name] 2.54 cm x 0.635cm and Joyce [name] felt like it was tunneling with yellow fibrin exposure worsening. Bilateral lower extremity edema with deeper red color on her lower calfs with worsening. Palliative [name] was asked to help address goals of [name].   CODE STATUS: Full code  PPS: 40% HOSPICE ELIGIBILITY/DIAGNOSIS: TBD  PAST MEDICAL HISTORY:  Past Medical History:  Diagnosis Date   Anxiety    Arthritis    Asthma    Asthma    Breast cancer ([facility])    Cancer ([facility])    COPD (chronic obstructive pulmonary disease) ([facility])    Enlarged aorta ([facility])    GERD (gastroesophageal reflux disease)    High cholesterol    Hx of skin cancer, basal cell    Hyperlipemia    Hypertension    PUD (peptic ulcer disease)    Thyroid cancer ([facility])  SOCIAL HX:  Social History   Tobacco Use   Smoking status: Never Smoker   Smokeless tobacco: Never Used  Substance Use Topics   Alcohol use: Yes    Alcohol/week: 1.0 standard drinks    Types: 1 Cans of beer per week    Comment: occasional beer, once a year    ALLERGIES: No Known Allergies   PERTINENT MEDICATIONS:  Outpatient Encounter Medications as of 05/12/2019    Medication Sig   acetaminophen (TYLENOL) 325 MG tablet Take 650 mg by mouth every 6 (six) hours as needed. Taking two tablets every morning.   acidophilus (RISAQUAD) CAPS capsule Take 1 capsule by mouth daily.   albuterol (PROVENTIL HFA;VENTOLIN HFA) 108 (90 Base) MCG/ACT inhaler Inhale 1-2 puffs into the lungs every 4 (four) hours as needed for wheezing or shortness of breath.   albuterol (PROVENTIL) (2.5 MG/3ML) 0.083% nebulizer solution Take 3 mLs (2.5 mg total) by nebulization every 6 (six) hours as needed for wheezing or shortness of breath.   alendronate (FOSAMAX) 70 MG tablet Take 1 tablet (70 mg total) by mouth once a week. Take with a full glass of water on an empty stomach.   atorvastatin (LIPITOR) 10 MG tablet Take 1 tablet (10 mg total) by mouth daily.   calcium carbonate (TUMS - DOSED IN MG ELEMENTAL CALCIUM) 500 MG chewable tablet Chew 2 tablets by mouth daily.    cholecalciferol (VITAMIN D) 1000 units tablet Take 2,000 Units by mouth daily.   collagenase (SANTYL) ointment Apply topically daily.   diltiazem (DILACOR XR) 120 MG 24 hr capsule Take 1 capsule (120 mg total) by mouth daily. Take 2 capsules by mouth every morning, and 1 capsule every evening. (Patient taking differently: Take 120 mg by mouth as directed. Take 2 capsules by mouth every morning, and 1 capsule every evening.)   doxycycline (VIBRA-TABS) 100 MG tablet Take 1 tablet (100 mg total) by mouth 2 (two) times daily.   feeding supplement, ENSURE ENLIVE, (ENSURE ENLIVE) LIQD Take 237 mLs by mouth 2 (two) times daily between meals.   Fluticasone-Salmeterol (ADVAIR) 500-50 MCG/DOSE AEPB Inhale 1 puff into the lungs 2 (two) times daily.    levothyroxine (SYNTHROID, LEVOTHROID) 150 MCG tablet Take 1 tablet (150 mcg total) by mouth daily before breakfast.   LORazepam (ATIVAN) 0.5 MG tablet Take 0.5 tablets (0.25 mg total) by mouth 3 (three) times daily as needed for anxiety.   mirtazapine (REMERON) 15 MG  tablet Take 1 tablet (15 mg total) by mouth at bedtime.   Multiple Vitamin (MULTIVITAMIN WITH MINERALS) TABS tablet Take 1 tablet by mouth daily.   No facility-administered encounter medications on file as of 05/12/2019.     PHYSICAL EXAM:  Deferred  Joyce [name] Z Jackline Castilla, NP

## 2019-05-13 ENCOUNTER — Other Ambulatory Visit: Payer: Self-pay

## 2019-05-14 ENCOUNTER — Other Ambulatory Visit: Payer: Self-pay

## 2019-05-14 ENCOUNTER — Encounter: Payer: Medicare Other | Attending: Physician Assistant | Admitting: Physician Assistant

## 2019-05-14 DIAGNOSIS — L89153 Pressure ulcer of sacral region, stage 3: Secondary | ICD-10-CM | POA: Insufficient documentation

## 2019-05-14 DIAGNOSIS — I10 Essential (primary) hypertension: Secondary | ICD-10-CM | POA: Diagnosis not present

## 2019-05-14 DIAGNOSIS — J449 Chronic obstructive pulmonary disease, unspecified: Secondary | ICD-10-CM | POA: Diagnosis not present

## 2019-05-14 DIAGNOSIS — I890 Lymphedema, not elsewhere classified: Secondary | ICD-10-CM | POA: Diagnosis not present

## 2019-05-14 DIAGNOSIS — I872 Venous insufficiency (chronic) (peripheral): Secondary | ICD-10-CM | POA: Diagnosis not present

## 2019-05-14 NOTE — Progress Notes (Signed)
TAHARI, CLABAUGH ([id number]) Visit Report for 05/14/2019 Allergy List Details Patient Name: Joyce [name], [name] Date of Service: 05/14/2019 2:15 PM Medical Record Number: [id number] Patient Account Number: [id number] Date of Birth/Sex: 12-20-1927 (85 y.o. F) Treating RN: Cornell Barman Primary Care Anyelin Mogle: [name], [name] Other Clinician: Referring Kaien Pezzullo: [name] Treating Xadrian Craighead/Extender: Melburn Hake, HOYT Weeks in Treatment: 0 Allergies Active Allergies No Known Allergies Allergy Notes Electronic Signature(s) Signed: 05/14/2019 5:05:22 PM By: Gretta Cool, BSN, RN, CWS, Kim RN, BSN Entered By: Gretta Cool, BSN, RN, CWS, Kim on 05/14/2019 14:25:40 Gheen, Irven [place] ([id number]) -------------------------------------------------------------------------------- Arrival Information Details Patient Name: Reimers, Jhoselin L. Date of Service: 05/14/2019 2:15 PM Medical Record Number: [id number] Patient Account Number: [id number] Date of Birth/Sex: 1927-11-29 (85 y.o. F) Treating RN: Cornell Barman Primary Care Mandel Seiden: [name], [name] Other Clinician: Referring Donjuan Robison: [name] Treating Michale Weikel/Extender: Sharalyn Ink in Treatment: 0 Visit Information Patient Arrived: Walker Arrival Time: 14:23 Accompanied By: daughter Transfer Assistance: None Patient Identification Verified: Yes Secondary Verification Process Completed: Yes Patient Requires Transmission-Based No Precautions: Patient Has Alerts: Yes Patient Alerts: Not Diabetic Electronic Signature(s) Signed: 05/14/2019 5:05:22 PM By: Gretta Cool, BSN, RN, CWS, Kim RN, BSN Entered By: Gretta Cool, BSN, RN, CWS, Kim on 05/14/2019 14:24:13 Trachtenberg, Irven [place] ([id number]) -------------------------------------------------------------------------------- Clinic Level of Care Assessment Details Patient Name: Holtrop, Zoriah L. Date of Service: 05/14/2019 2:15 PM Medical Record Number: [id number] Patient Account Number: [id number] Date of  Birth/Sex: 1928-08-06 (85 y.o. F) Treating RN: Montey Hora Primary Care Amiel Mccaffrey: [name], [name] Other Clinician: Referring Shannyn Jankowiak: [name] Treating Cherrell Maybee/Extender: Melburn Hake, HOYT Weeks in Treatment: 0 Clinic Level of Care Assessment Items TOOL 2 Quantity Score []  - Use when only an EandM is performed on the INITIAL visit 0 ASSESSMENTS - Nursing Assessment / Reassessment X - General Physical Exam (combine w/ comprehensive assessment (listed just below) when 1 20 performed on new pt. evals) X- 1 25 Comprehensive Assessment (HX, ROS, Risk Assessments, Wounds Hx, etc.) ASSESSMENTS - Wound and Skin Assessment / Reassessment X - Simple Wound Assessment / Reassessment - one wound 1 5 []  - 0 Complex Wound Assessment / Reassessment - multiple wounds X- 1 10 Dermatologic / Skin Assessment (not related to wound area) ASSESSMENTS - Ostomy and/or Continence Assessment and Care []  - Incontinence Assessment and Management 0 []  - 0 Ostomy Care Assessment and Management (repouching, etc.) PROCESS - Coordination of Care X - Simple Patient / Family Education for ongoing care 1 15 []  - 0 Complex (extensive) Patient / Family Education for ongoing care X- 1 10 Staff obtains Programmer, systems, Records, Test Results / Process Orders []  - 0 Staff telephones HHA, Nursing Homes / Clarify orders / etc []  - 0 Routine Transfer to another Facility (non-emergent condition) []  - 0 Routine Hospital Admission (non-emergent condition) []  - 0 New Admissions / Biomedical engineer / Ordering NPWT, Apligraf, etc. []  - 0 Emergency Hospital Admission (emergent condition) X- 1 10 Simple Discharge Coordination []  - 0 Complex (extensive) Discharge Coordination PROCESS - Special Needs []  - Pediatric / Minor Patient Management 0 []  - 0 Isolation Patient Management Fatula, Giavonna L. ([id number]) []  - 0 Hearing / Language / Visual special needs []  - 0 Assessment of Community assistance  (transportation, D/C planning, etc.) []  - 0 Additional assistance / Altered mentation []  - 0 Support Surface(s) Assessment (bed, cushion, seat, etc.) INTERVENTIONS - Wound Cleansing / Measurement X - Wound Imaging (photographs - any number of wounds) 1 5 []  - 0 Wound Tracing (  instead of photographs) X- 1 5 Simple Wound Measurement - one wound []  - 0 Complex Wound Measurement - multiple wounds X- 1 5 Simple Wound Cleansing - one wound []  - 0 Complex Wound Cleansing - multiple wounds INTERVENTIONS - Wound Dressings X - Small Wound Dressing one or multiple wounds 1 10 []  - 0 Medium Wound Dressing one or multiple wounds []  - 0 Large Wound Dressing one or multiple wounds []  - 0 Application of Medications - injection INTERVENTIONS - Miscellaneous []  - External ear exam 0 []  - 0 Specimen Collection (cultures, biopsies, blood, body fluids, etc.) []  - 0 Specimen(s) / Culture(s) sent or taken to Lab for analysis []  - 0 Patient Transfer (multiple staff / Civil Service fast streamer / Similar devices) []  - 0 Simple Staple / Suture removal (25 or less) []  - 0 Complex Staple / Suture removal (26 or more) []  - 0 Hypo / Hyperglycemic Management (close monitor of Blood Glucose) X- 1 15 Ankle / Brachial Index (ABI) - do not check if billed separately Has the patient been seen at the hospital within the last three years: Yes Total Score: 135 Level Of Care: New/Established - Level 4 Electronic Signature(s) Signed: 05/14/2019 4:39:46 PM By: Montey Hora Entered By: Montey Hora on 05/14/2019 15:17:58 Giacomo, Arnie L. ([id number]) -------------------------------------------------------------------------------- Encounter Discharge Information Details Patient Name: Coccia, Brisha L. Date of Service: 05/14/2019 2:15 PM Medical Record Number: [id number] Patient Account Number: [id number] Date of Birth/Sex: 1928/11/04 (85 y.o. F) Treating RN: Montey Hora Primary Care Terrace Chiem: [name], [name] Other  Clinician: Referring Christella App: [name] Treating Karlina Suares/Extender: Melburn Hake, HOYT Weeks in Treatment: 0 Encounter Discharge Information Items Post Procedure Vitals Discharge Condition: Stable Temperature (F): 98.7 Ambulatory Status: Walker Pulse (bpm): 96 Discharge Destination: Home Respiratory Rate (breaths/min): 16 Transportation: Private Auto Blood Pressure (mmHg): 108/67 Accompanied By: daughter Schedule Follow-up Appointment: Yes Clinical Summary of Care: Electronic Signature(s) Signed: 05/14/2019 4:39:46 PM By: Montey Hora Entered By: Montey Hora on 05/14/2019 15:24:04 Kohan, Iza L. ([id number]) -------------------------------------------------------------------------------- Lower Extremity Assessment Details Patient Name: Wiest, Alyria L. Date of Service: 05/14/2019 2:15 PM Medical Record Number: [id number] Patient Account Number: [id number] Date of Birth/Sex: 20-May-1928 (85 y.o. F) Treating RN: Cornell Barman Primary Care Pieter Fooks: [name], [name] Other Clinician: Referring Kryslyn Helbig: [name], RICHARD Treating Shyane Fossum/Extender: Melburn Hake, HOYT Weeks in Treatment: 0 Edema Assessment Assessed: [Left: No] [Right: No] [Left: Edema] [Right: :] Calf Left: Right: Point of Measurement: 32 cm From Medial Instep 41 cm 41 cm Ankle Left: Right: Point of Measurement: 11 cm From Medial Instep 26.2 cm 26 cm Vascular Assessment Pulses: Dorsalis Pedis Palpable: [Left:Yes] [Right:Yes] Doppler Audible: [Left:Yes] [Right:Yes] Posterior Tibial Palpable: [Left:Yes Yes] [Right:Yes Yes] Notes Patient was unable to tolerate the BP cuff above 120. Asked RN to stop. Electronic Signature(s) Signed: 05/14/2019 5:05:22 PM By: Gretta Cool, BSN, RN, CWS, Kim RN, BSN Entered By: Gretta Cool, BSN, RN, CWS, Kim on 05/14/2019 14:51:52 [place], Irven [place] ([id number]) -------------------------------------------------------------------------------- Multi Wound Chart Details Patient  Name: Husk, Vikkie L. Date of Service: 05/14/2019 2:15 PM Medical Record Number: [id number] Patient Account Number: [id number] Date of Birth/Sex: [birth year] (85 y.o. F) Treating RN: Montey Hora Primary Care Seiji Wiswell: [name], [name] Other Clinician: Referring Ned Kakar: [name], [name] Treating Lyncoln Ledgerwood/Extender: Melburn Hake, HOYT Weeks in Treatment: 0 Vital Signs Height(in): 60 Pulse(bpm): 96 Weight(lbs): 130 Blood Pressure(mmHg): 108/67 Body Mass Index(BMI): 25 Temperature(F): 98.7 Respiratory Rate 16 (breaths/min): Photos: [N/A:N/A] Wound Location: Sacrum - Midline N/A N/A Wounding Event: Gradually Appeared N/A N/A Primary Etiology: Pressure  Ulcer N/A N/A Comorbid History: Chronic Obstructive N/A N/A Pulmonary Disease (COPD), Osteoarthritis Date Acquired: 12/28/2018 N/A N/A Weeks of Treatment: 0 N/A N/A Wound Status: Open N/A N/A Measurements L x W x D 2.2x0.7x0.2 N/A N/A (cm) Area (cm) : 1.21 N/A N/A Volume (cm) : 0.242 N/A N/A % Reduction in Area: 0.00% N/A N/A % Reduction in Volume: 0.00% N/A N/A Classification: Category/Stage II N/A N/A Exudate Amount: Small N/A N/A Exudate Type: Serous N/A N/A Exudate Color: amber N/A N/A Wound Margin: Epibole N/A N/A Granulation Amount: Large (67-100%) N/A N/A Granulation Quality: Pink N/A N/A Exposed Structures: Fascia: No N/A N/A Fat Layer (Subcutaneous Tissue) Exposed: No Tendon: No Muscle: No Joint: No Bone: No Barcellos, Jeda L. ([id number]) Treatment Notes Electronic Signature(s) Signed: 05/14/2019 4:39:46 PM By: Montey Hora Entered By: Montey Hora on 05/14/2019 15:07:57 Prisk, Jo-Ann L. ([id number]) -------------------------------------------------------------------------------- [place] Details Patient Name: Bouvier, Ziggy L. Date of Service: 05/14/2019 2:15 PM Medical Record Number: [id number] Patient Account Number: [id number] Date of Birth/Sex: 1928-10-10 (85 y.o.  F) Treating RN: Montey Hora Primary Care Lakota Markgraf: [name], [name] Other Clinician: Referring Brandelyn Henne: [name] Treating Rajeev Escue/Extender: Melburn Hake, HOYT Weeks in Treatment: 0 Active Inactive Abuse / Safety / Falls / Self Care Management Nursing Diagnoses: Potential for falls Goals: Patient will not experience any injury related to falls Date Initiated: 05/14/2019 Target Resolution Date: 08/14/2019 Goal Status: Active Interventions: Assess fall risk on admission and as needed Notes: Orientation to the Wound Care Program Nursing Diagnoses: Knowledge deficit related to the wound healing center program Goals: Patient/caregiver will verbalize understanding of the [facility] Program Date Initiated: 05/14/2019 Target Resolution Date: 08/14/2019 Goal Status: Active Interventions: Provide education on orientation to the wound center Notes: Pressure Nursing Diagnoses: Potential for impaired tissue integrity related to pressure, friction, moisture, and shear Goals: Patient will remain free from development of additional pressure ulcers Date Initiated: 05/14/2019 Target Resolution Date: 08/14/2019 Goal Status: Active Interventions: Provide education on pressure ulcers Coppens, Evagelia L. ([id number]) Notes: Wound/Skin Impairment Nursing Diagnoses: Impaired tissue integrity Goals: Ulcer/skin breakdown will heal within 14 weeks Date Initiated: 05/14/2019 Target Resolution Date: 08/14/2019 Goal Status: Active Interventions: Assess patient/caregiver ability to obtain necessary supplies Assess patient/caregiver ability to perform ulcer/skin care regimen upon admission and as needed Assess ulceration(s) every visit Notes: Electronic Signature(s) Signed: 05/14/2019 4:39:46 PM By: Montey Hora Entered By: Montey Hora on 05/14/2019 15:07:32 Rabinovich, Rion L.  ([id number]) -------------------------------------------------------------------------------- Pain Assessment Details Patient Name: Fennel, Meenakshi L. Date of Service: 05/14/2019 2:15 PM Medical Record Number: [id number] Patient Account Number: [id number] Date of Birth/Sex: [birth year] (85 y.o. F) Treating RN: Cornell Barman Primary Care Kenith Trickel: [name], [name] Other Clinician: Referring Ole Lafon: [name], [name] Treating Roxy Filler/Extender: Melburn Hake, HOYT Weeks in Treatment: 0 Active Problems Location of Pain Severity and Description of Pain Patient Has Paino No Site Locations Pain Management and Medication Current Pain Management: Notes Patient denies pain at this time. Electronic Signature(s) Signed: 05/14/2019 5:05:22 PM By: Gretta Cool, BSN, RN, CWS, Kim RN, BSN Entered By: Gretta Cool, BSN, RN, CWS, Kim on 05/14/2019 14:24:37 Quinonez, Irven [place] ([id number]) -------------------------------------------------------------------------------- Patient/Caregiver Education Details Patient Name: Alderfer, Ohana L. Date of Service: 05/14/2019 2:15 PM Medical Record Number: [id number] Patient Account Number: [id number] Date of Birth/Gender: 07-09-1928 (85 y.o. F) Treating RN: Montey Hora Primary Care Physician: [name], [name] Other Clinician: Referring Physician: [name] Treating Physician/Extender: Sharalyn Ink in Treatment: 0 Education Assessment Education Provided To: Patient and Caregiver Education Topics Provided Pressure:  Handouts: Preventing Pressure Ulcers, Other: offloading bottom Methods: Explain/Verbal Responses: State content correctly Venous: Handouts: Other: need for compression and leg elevation Methods: Explain/Verbal Responses: State content correctly Electronic Signature(s) Signed: 05/14/2019 4:39:46 PM By: Montey Hora Entered By: Montey Hora on 05/14/2019 15:18:37 Tadesse, Monea L.  ([id number]) -------------------------------------------------------------------------------- Wound Assessment Details Patient Name: Schimek, Sunday L. Date of Service: 05/14/2019 2:15 PM Medical Record Number: [id number] Patient Account Number: [id number] Date of Birth/Sex: Mar 28, 1928 (85 y.o. F) Treating RN: Cornell Barman Primary Care Micky Overturf: [name], [name] Other Clinician: Referring Madolin Twaddle: [name], [name] Treating Spiros Greenfeld/Extender: Melburn Hake, HOYT Weeks in Treatment: 0 Wound Status Wound Number: 1 Primary Pressure Ulcer Etiology: Wound Location: Sacrum - Midline Wound Status: Open Wounding Event: Gradually Appeared Comorbid Chronic Obstructive Pulmonary Disease Date Acquired: 12/28/2018 History: (COPD), Osteoarthritis Weeks Of Treatment: 0 Clustered Wound: No Photos Wound Measurements Length: (cm) 2.2 % Reduction i Width: (cm) 0.7 % Reduction i Depth: (cm) 0.2 Epithelializa Area: (cm) 1.21 Tunneling: Volume: (cm) 0.242 Undermining: n Area: 0% n Volume: 0% tion: None No No Wound Description Classification: Category/Stage III Foul Odor Aft Wound Margin: Epibole Slough/Fibrin Exudate Amount: Small Exudate Type: Serous Exudate Color: amber er Cleansing: No o No Wound Bed Granulation Amount: Large (67-100%) Exposed Structure Granulation Quality: Pink Fascia Exposed: No Fat Layer (Subcutaneous Tissue) Exposed: Yes Tendon Exposed: No Muscle Exposed: No Joint Exposed: No Bone Exposed: No Treatment Notes Funnell, Skyleen L. ([id number]) Wound #1 (Midline Sacrum) Notes silvercel, BFD Electronic Signature(s) Signed: 05/14/2019 4:39:46 PM By: Montey Hora Signed: 05/14/2019 5:05:22 PM By: Gretta Cool, BSN, RN, CWS, Kim RN, BSN Entered By: Montey Hora on 05/14/2019 15:15:09 Teton, Kershaw ([id number]) -------------------------------------------------------------------------------- Vitals Details Patient Name: Canizalez, Shaliah L. Date of Service: 05/14/2019 2:15  PM Medical Record Number: [id number] Patient Account Number: [id number] Date of Birth/Sex: 1928/01/12 (85 y.o. F) Treating RN: Cornell Barman Primary Care Marijo Quizon: [name], [name] Other Clinician: Referring Porfiria Heinrich: [name], [name] Treating Elion Hocker/Extender: Melburn Hake, HOYT Weeks in Treatment: 0 Vital Signs Time Taken: 14:24 Temperature (F): 98.7 Height (in): 60 Pulse (bpm): 96 Weight (lbs): 130 Respiratory Rate (breaths/min): 16 Body Mass Index (BMI): 25.4 Blood Pressure (mmHg): 108/67 Reference Range: 80 - 120 mg / dl Electronic Signature(s) Signed: 05/14/2019 5:05:22 PM By: Gretta Cool, BSN, RN, CWS, Kim RN, BSN Entered By: Gretta Cool, BSN, RN, CWS, Kim on 05/14/2019 14:25:16

## 2019-05-14 NOTE — Progress Notes (Signed)
KINZA, GOUVEIA ([id number]) Visit Report for 05/14/2019 Abuse/Suicide Risk Screen Details Patient Name: [name], [name] Date of Service: 05/14/2019 2:15 PM Medical Record Number: [id number] Patient Account Number: [id number] Date of Birth/Sex: 12-08-1927 (85 y.o. F) Treating RN: Cornell Barman Primary Care Marvella Jenning: [name], [name] Other Clinician: Referring Heru Montz: [name], [name] Treating Ransome Helwig/Extender: Melburn Hake, HOYT Weeks in Treatment: 0 Abuse/Suicide Risk Screen Items Answer ABUSE RISK SCREEN: Has anyone close to you tried to hurt or harm you recentlyo No Do you feel uncomfortable with anyone in your familyo No Has anyone forced you do things that you didnot want to doo No Electronic Signature(s) Signed: 05/14/2019 5:05:22 PM By: Gretta Cool, BSN, RN, CWS, Kim RN, BSN Entered By: Gretta Cool, BSN, RN, CWS, Kim on 05/14/2019 [place], [place]. ([id number]) -------------------------------------------------------------------------------- Activities of Daily Living Details Patient Name: [name], [name] Date of Service: 05/14/2019 2:15 PM Medical Record Number: [id number] Patient Account Number: [id number] Date of Birth/Sex: July 17, 1928 (85 y.o. F) Treating RN: Cornell Barman Primary Care Kailana Benninger: [name], [name] Other Clinician: Referring Dagen Beevers: [name], [name] Treating Sylus Stgermain/Extender: Melburn Hake, HOYT Weeks in Treatment: 0 Activities of Daily Living Items Answer Activities of Daily Living (Please select one for each item) Drive Automobile Not Able Take Medications Not Able Use Telephone Need Assistance Care for Appearance Need Assistance Use Toilet Need Assistance Bath / Shower Need Assistance Dress Self Need Assistance Feed Self Need Assistance Walk Need Assistance Get In / Out Bed Need Assistance Housework Need Assistance Prepare Meals Need Assistance Handle Money Need Assistance Shop for Self Need Assistance Electronic Signature(s) Signed: 05/14/2019  5:05:22 PM By: Gretta Cool, BSN, RN, CWS, Kim RN, BSN Entered By: Gretta Cool, BSN, RN, CWS, Kim on 05/14/2019 14:31:41 [place], Irven [place] ([id number]) -------------------------------------------------------------------------------- Education Screening Details Patient Name: [name], [name] Date of Service: 05/14/2019 2:15 PM Medical Record Number: [id number] Patient Account Number: [id number] Date of Birth/Sex: 05/14/1928 (85 y.o. F) Treating RN: Cornell Barman Primary Care Dniyah Grant: [name], [name] Other Clinician: Referring Brixton Schnapp: [name] Treating Maresa Morash/Extender: Sharalyn Ink in Treatment: 0 Primary Learner Assessed: Patient Learning Preferences/Education Level/Primary Language Learning Preference: Explanation, Demonstration Highest Education Level: High School Preferred Language: English Cognitive Barrier Language Barrier: No Translator Needed: No Memory Deficit: No Emotional Barrier: No Cultural/Religious Beliefs Affecting Medical Care: No Physical Barrier Impaired Vision: No Impaired Hearing: No Decreased Hand dexterity: No Knowledge/Comprehension Knowledge Level: High Comprehension Level: High Ability to understand written High instructions: Ability to understand verbal High instructions: Motivation Anxiety Level: Calm Cooperation: Cooperative Education Importance: Acknowledges Need Interest in Health Problems: Asks Questions Perception: Coherent Willingness to Engage in Self- High Management Activities: Readiness to Engage in Self- High Management Activities: Electronic Signature(s) Signed: 05/14/2019 5:05:22 PM By: Gretta Cool, BSN, RN, CWS, Kim RN, BSN Entered By: Gretta Cool, BSN, RN, CWS, Kim on 05/14/2019 14:32:11 Debell, Irven [place] ([id number]) -------------------------------------------------------------------------------- Fall Risk Assessment Details Patient Name: [name], [name] Date of Service: 05/14/2019 2:15 PM Medical Record Number:  [id number] Patient Account Number: [id number] Date of Birth/Sex: 1928-04-04 (85 y.o. F) Treating RN: Cornell Barman Primary Care Nasean Zapf: [name], [name] Other Clinician: Referring Cobain Morici: [name], [name] Treating Tymeer Vaquera/Extender: Melburn Hake, HOYT Weeks in Treatment: 0 Fall Risk Assessment Items Have you had 2 or more falls in the last 12 monthso 0 Yes Have you had any fall that resulted in injury in the last 12 monthso 0 No FALLS RISK SCREEN History of falling - immediate or within 3 months 0 No Secondary diagnosis (Do you have 2  or more medical diagnoseso) 0 No Ambulatory aid None/bed rest/wheelchair/nurse 0 No Crutches/cane/walker 15 Yes Furniture 0 No Intravenous therapy Access/Saline/Heparin Lock 0 No Gait/Transferring Normal/ bed rest/ wheelchair 0 No Weak (short steps with or without shuffle, stooped but able to lift head while 10 Yes walking, may seek support from furniture) Impaired (short steps with shuffle, may have difficulty arising from chair, head 0 No down, impaired balance) Mental Status Oriented to own ability 0 Yes Electronic Signature(s) Signed: 05/14/2019 5:05:22 PM By: Gretta Cool, BSN, RN, CWS, Kim RN, BSN Entered By: Gretta Cool, BSN, RN, CWS, Kim on 05/14/2019 14:32:54 Sporrer, Irven [place] ([id number]) -------------------------------------------------------------------------------- Foot Assessment Details Patient Name: [name], [name] Date of Service: 05/14/2019 2:15 PM Medical Record Number: [id number] Patient Account Number: [id number] Date of Birth/Sex: 1928/10/08 (85 y.o. F) Treating RN: Cornell Barman Primary Care Tarrell Debes: [name], [name] Other Clinician: Referring Jessica Checketts: [name], [name] Treating Pheonix Clinkscale/Extender: Melburn Hake, HOYT Weeks in Treatment: 0 Foot Assessment Items Site Locations + = Sensation present, - = Sensation absent, C = Callus, U = Ulcer R = Redness, W = Warmth, M = Maceration, PU = Pre-ulcerative lesion F = Fissure, S =  Swelling, D = Dryness Assessment Right: Left: Other Deformity: No No Prior Foot Ulcer: No No Prior Amputation: No No Charcot Joint: No No Ambulatory Status: Ambulatory With Help Assistance Device: Walker GaitEnergy manager) Signed: 05/14/2019 5:05:22 PM By: Gretta Cool, BSN, RN, CWS, Kim RN, BSN Entered By: Gretta Cool, BSN, RN, CWS, Kim on 05/14/2019 14:33:39 Zimbelman, Irven [place] ([id number]) -------------------------------------------------------------------------------- Nutrition Risk Screening Details Patient Name: [name], [name] Date of Service: 05/14/2019 2:15 PM Medical Record Number: [id number] Patient Account Number: [id number] Date of Birth/Sex: 09/10/1928 (85 y.o. F) Treating RN: Cornell Barman Primary Care Mariellen Blaney: [name], [name] Other Clinician: Referring Regene Mccarthy: [name], [name] Treating Dalicia Kisner/Extender: Melburn Hake, HOYT Weeks in Treatment: 0 Height (in): 60 Weight (lbs): 130 Body Mass Index (BMI): 25.4 Nutrition Risk Screening Items Score Screening NUTRITION RISK SCREEN: I have an illness or condition that made me change the kind and/or amount of 0 No food I eat I eat fewer than two meals per day 0 No I eat few fruits and vegetables, or milk products 0 No I have three or more drinks of beer, liquor or wine almost every day 0 No I have tooth or mouth problems that make it hard for me to eat 0 No I don't always have enough money to buy the food I need 0 No I eat alone most of the time 0 No I take three or more different prescribed or over-the-counter drugs a day 0 No Without wanting to, I have lost or gained 10 pounds in the last six months 0 No I am not always physically able to shop, cook and/or feed myself 0 No Nutrition Protocols Good Risk Protocol 0 No interventions needed Moderate Risk Protocol High Risk Proctocol Risk Level: Good Risk Score: 0 Electronic Signature(s) Signed: 05/14/2019 5:05:22 PM By: Gretta Cool, BSN, RN, CWS, Kim RN, BSN Entered  By: Gretta Cool, BSN, RN, CWS, Kim on 05/14/2019 14:33:20

## 2019-05-14 NOTE — Progress Notes (Addendum)
JESSELLE, LAFLAMME ([id number]) Visit Report for 05/14/2019 Chief Complaint Document Details Patient Name: Joyce [name], [name] Date of Service: 05/14/2019 2:15 PM Medical Record Number: [id number] Patient Account Number: [id number] Date of Birth/Sex: [birth year] (85 y.o. F) Treating RN: Montey Hora Primary Care Provider: [name], [name] Other Clinician: Referring Provider: [name] Treating Provider/Extender: Melburn Hake, Anderson Middlebrooks Weeks in Treatment: 0 Information Obtained from: Patient Chief Complaint Sacral pressure ulcer and bilateral LE lymphedema Electronic Signature(s) Signed: 05/14/2019 4:11:23 PM By: [name] PA-C Entered By: [name] on 05/14/2019 15:00:46 Glandon, Laree L. ([id number]) -------------------------------------------------------------------------------- Debridement Details Patient Name: Hopkins, [name] Date of Service: 05/14/2019 2:15 PM Medical Record Number: [id number] Patient Account Number: [id number] Date of Birth/Sex: 11/22/1927 (85 y.o. F) Treating RN: Montey Hora Primary Care Provider: [name], [name] Other Clinician: Referring Provider: [name] Treating Provider/Extender: Melburn Hake, Marilene Vath Weeks in Treatment: 0 Debridement Performed for Wound #1 Midline Sacrum Assessment: Performed By: Physician STONE III, Novaleigh Kohlman E., PA-C Debridement Type: Chemical/Enzymatic/Mechanical Agent Used: saline Level of Consciousness (Pre- Awake and Alert procedure): Pre-procedure Verification/Time Yes - 15:16 Out Taken: Start Time: 15:16 Pain Control: Lidocaine 4% Topical Solution Instrument: Other : gauze and saline Bleeding: Minimum Hemostasis Achieved: Pressure End Time: 15:17 Procedural Pain: 0 Post Procedural Pain: 0 Response to Treatment: Procedure was tolerated well Level of Consciousness Awake and Alert (Post-procedure): Post Debridement Measurements of Total Wound Length: (cm) 2.2 Stage: Category/Stage III Width: (cm)  0.7 Depth: (cm) 0.2 Volume: (cm) 0.242 Character of Wound/Ulcer Post Improved Debridement: Post Procedure Diagnosis Same as Pre-procedure Electronic Signature(s) Signed: 05/17/2019 11:23:33 AM By: Sharon Mt Signed: 05/17/2019 4:55:00 PM By: Montey Hora Signed: 05/20/2019 11:33:27 PM By: [name] PA-C Previous Signature: 05/14/2019 4:11:23 PM Version By: [name] PA-C Previous Signature: 05/14/2019 4:39:46 PM Version By: Montey Hora Entered By: Sharon Mt on 05/17/2019 11:23:32 Scally, Preslei L. ([id number]) -------------------------------------------------------------------------------- HPI Details Patient Name: Hiscox, [name] Date of Service: 05/14/2019 2:15 PM Medical Record Number: [id number] Patient Account Number: [id number] Date of Birth/Sex: 10/05/1928 (85 y.o. F) Treating RN: Montey Hora Primary Care Provider: [name], [name] Other Clinician: Referring Provider: [name] Treating Provider/Extender: [name] Weeks in Treatment: 0 History of Present Illness HPI Description: 05/14/19 on evaluation today patient appears to be doing rather well in regard to her lower extremities compared to what it sounds like she was doing not that far back. Fortunately there's no signs of active infection at this time although it does sound like she had significant cellulitis earlier in the year. She does also have a issue with a sacral ulcer upon inspection today which has been present since around February 2020 this is about as long as she's been dealing with her legs as well. She is under palliative care but not hospice. The patient does have a history of scoliosis and kyphosis which makes it difficult for her to lay flat she actually sleep sitting up in a recliner but night and reclined at this cause her trouble breathing. She also has COPD, hypertension, and bilateral lower should be lymphedema and venous stasis. Currently there's no signs of active  cellulitis of the bilateral lower extremities which is good news. The patient has not had any vascular intervention nor evaluation that we have on file. She does spend the majority per day apparently sitting in her recliner upright. This I think is an issue both for her legs as well as for the sacral region unfortunately. She could not  tolerate the ABI's today due to the fact that it calls too much pain when we were performing the blood pressure reading on her lower extremities who were not even able to get a proper reading. No fevers, chills, nausea, or vomiting noted at this time. Electronic Signature(s) Signed: 05/14/2019 4:11:23 PM By: [name] PA-C Entered By: [name] on 05/14/2019 16:05:16 Goughnour, Elyria LMarland Kitchen ([id number]) -------------------------------------------------------------------------------- Physical Exam Details Patient Name: Joyce [name], [name] Date of Service: 05/14/2019 2:15 PM Medical Record Number: [id number] Patient Account Number: [id number] Date of Birth/Sex: 19-Sep-1928 (85 y.o. F) Treating RN: Montey Hora Primary Care Provider: [name], [name] Other Clinician: Referring Provider: [name], [name] Treating Provider/Extender: Melburn Hake, Uzziah Rigg Weeks in Treatment: 0 Constitutional sitting or standing blood pressure is within target range for patient.. pulse regular and within target range for patient.Marland Kitchen respirations regular, non-labored and within target range for patient.Marland Kitchen temperature within target range for patient.. Well- nourished and well-hydrated in no acute distress. Eyes conjunctiva clear no eyelid edema noted. pupils equal round and reactive to light and accommodation. Ears, Nose, Mouth, and Throat no gross abnormality of ear auricles or external auditory canals. normal hearing noted during conversation. mucus membranes moist. Respiratory normal breathing without difficulty. clear to auscultation bilaterally. Cardiovascular regular rate and  rhythm with normal S1, S2. Nonpitting lymphedema of the bilateral LEs. Gastrointestinal (GI) soft, non-tender, non-distended, +BS. no ventral hernia noted. Musculoskeletal Patient unable to walk without assistance. Patient has significant scoliosis and kyphosis noted on examination.Marland Kitchen Psychiatric this patient is able to make decisions and demonstrates good insight into disease process. Alert and Oriented x 3. pleasant and cooperative. Notes Upon evaluation today patient actually appears to have no wounds of the bilateral lower extremities although she does have evidence of stage II lymphedema and venous stasis. Fortunately there's no signs of active infection at this time. With regard to the sacral wound she actually has a fairly clean appearing wound bed. There are some drainage noted although I think that this is not too significant with appropriate dressings I believe it would do quite well. With regard to her lower extremities I think the main thing she's back now is compression therapy bilaterally to prevent further ulcerations and injury. Electronic Signature(s) Signed: 05/14/2019 4:11:23 PM By: [name] PA-C Entered By: [name] on 05/14/2019 16:08:03 Cornelio, Irven [place] ([id number]) -------------------------------------------------------------------------------- Physician Orders Details Patient Name: Cumbie, Lourdes L. Date of Service: 05/14/2019 2:15 PM Medical Record Number: [id number] Patient Account Number: [id number] Date of Birth/Sex: 05-27-1928 (85 y.o. F) Treating RN: Montey Hora Primary Care Provider: [name], [name] Other Clinician: Referring Provider: [name] Treating Provider/Extender: Melburn Hake, Nickolaus Bordelon Weeks in Treatment: 0 Verbal / Phone Orders: No Diagnosis Coding ICD-10 Coding Code Description L89.152 Pressure ulcer of sacral region, stage 2 I89.0 Lymphedema, not elsewhere classified I87.2 Venous insufficiency (chronic) (peripheral) I10  Essential (primary) hypertension J44.9 Chronic obstructive pulmonary disease, unspecified Wound Cleansing Wound #1 Midline Sacrum o Dial antibacterial soap, wash wounds, rinse and pat dry prior to dressing wounds o May Shower, gently pat wound dry prior to applying new dressing. Primary Wound Dressing Wound #1 Midline Sacrum o Silver Alginate Secondary Dressing Wound #1 Midline Sacrum o Boardered Foam Dressing Dressing Change Frequency Wound #1 Midline Sacrum o Change Dressing Monday, Wednesday, Friday Follow-up Appointments Wound #1 Midline Sacrum o Return Appointment in 1 week. Edema Control o Elevate legs to the level of the heart and pump ankles as often as possible Home Health Wound #1  [place] for [place] Nurse may visit PRN to address patientos wound care needs. o FACE TO FACE ENCOUNTER: MEDICARE and MEDICAID PATIENTS: I certify that this patient is under my care and that I had a face-to-face encounter that meets the physician face-to-face encounter requirements with this patient on this date. The encounter with the patient was in whole or in part for the following MEDICAL CONDITION: (primary reason for [place]) MEDICAL NECESSITY: I certify, that based on my findings, NURSING services are a medically necessary home health service. HOME BOUND STATUS: I certify that my clinical findings support that this patient is homebound (i.e., Due to illness or injury, pt requires aid of Forrey, Chaundra L. ([id number]) supportive devices such as crutches, cane, wheelchairs, walkers, the use of special transportation or the assistance of another person to leave their place of residence. There is a normal inability to leave the home and doing so requires considerable and taxing effort. Other absences are for medical reasons / religious services and are infrequent or of short duration when for other reasons). o If  current dressing causes regression in wound condition, may D/C ordered dressing product/s and apply Normal Saline Moist Dressing daily until next [place] / Other MD appointment. [place] of regression in wound condition at [phone]. o Please direct any NON-WOUND related issues/requests for orders to patient's Primary Care Physician Electronic Signature(s) Signed: 05/14/2019 4:11:23 PM By: [name] PA-C Signed: 05/14/2019 4:39:46 PM By: Montey Hora Entered By: Montey Hora on 05/14/2019 15:21:03 Avera, Lucyann L. ([id number]) -------------------------------------------------------------------------------- Problem List Details Patient Name: Joyce [name], [name] Date of Service: 05/14/2019 2:15 PM Medical Record Number: [id number] Patient Account Number: [id number] Date of Birth/Sex: June 22, 1928 (85 y.o. F) Treating RN: Montey Hora Primary Care Provider: [name], [name] Other Clinician: Referring Provider: [name], [name] Treating Provider/Extender: Melburn Hake, Itzamara Casas Weeks in Treatment: 0 Active Problems ICD-10 Evaluated Encounter Code Description Active Date Today Diagnosis L89.153 Pressure ulcer of sacral region, stage 3 05/14/2019 No Yes I89.0 Lymphedema, not elsewhere classified 05/14/2019 No Yes I87.2 Venous insufficiency (chronic) (peripheral) 05/14/2019 No Yes I10 Essential (primary) hypertension 05/14/2019 No Yes J44.9 Chronic obstructive pulmonary disease, unspecified 05/14/2019 No Yes Inactive Problems Resolved Problems Electronic Signature(s) Signed: 05/14/2019 4:11:23 PM By: [name] PA-C Entered By: [name] on 05/14/2019 16:02:10 Meharg, Valisa L. ([id number]) -------------------------------------------------------------------------------- Progress Note Details Patient Name: Casavant, Terrell L. Date of Service: 05/14/2019 2:15 PM Medical Record Number: [id number] Patient Account Number: [id number] Date of Birth/Sex:  08-19-1928 (85 y.o. F) Treating RN: Montey Hora Primary Care Provider: [name], [name] Other Clinician: Referring Provider: [name] Treating Provider/Extender: Melburn Hake, Leonora Gores Weeks in Treatment: 0 Subjective Chief Complaint Information obtained from Patient Sacral pressure ulcer and bilateral LE lymphedema History of Present Illness (HPI) 05/14/19 on evaluation today patient appears to be doing rather well in regard to her lower extremities compared to what it sounds like she was doing not that far back. Fortunately there's no signs of active infection at this time although it does sound like she had significant cellulitis earlier in the year. She does also have a issue with a sacral ulcer upon inspection today which has been present since around February 2020 this is about as long as she's been dealing with her legs as well. She is under palliative care but not hospice. The patient does have a history of scoliosis and kyphosis which makes it difficult for her  to lay flat she actually sleep sitting up in a recliner but night and reclined at this cause her trouble breathing. She also has COPD, hypertension, and bilateral lower should be lymphedema and venous stasis. Currently there's no signs of active cellulitis of the bilateral lower extremities which is good news. The patient has not had any vascular intervention nor evaluation that we have on file. She does spend the majority per day apparently sitting in her recliner upright. This I think is an issue both for her legs as well as for the sacral region unfortunately. She could not tolerate the ABI's today due to the fact that it calls too much pain when we were performing the blood pressure reading on her lower extremities who were not even able to get a proper reading. No fevers, chills, nausea, or vomiting noted at this time. Patient History Information obtained from Patient. Allergies No Known Allergies Family  History Cancer - Child, Diabetes - Siblings, Hypertension - Siblings, Stroke - Mother, No family history of Heart Disease, Kidney Disease, Lung Disease, Seizures, Thyroid Problems, Tuberculosis. Social History Never smoker, Marital Status - Widowed, Alcohol Use - Rarely, Caffeine Use - Daily. Medical History Respiratory Patient has history of Chronic Obstructive Pulmonary Disease (COPD) Integumentary (Skin) Denies history of History of pressure wounds Musculoskeletal Patient has history of Osteoarthritis - hands, knees, sholders Oncologic Denies history of Received Chemotherapy, Received Radiation Review of Systems (ROS) Eyes Denies complaints or symptoms of Dry Eyes, Vision Changes, Glasses / Contacts. Yowell, Oluwanifemi L. ([id number]) Ear/Nose/Mouth/Throat Denies complaints or symptoms of Difficult clearing ears, Sinusitis. Hematologic/Lymphatic Denies complaints or symptoms of Bleeding / Clotting Disorders, Human Immunodeficiency Virus. Respiratory Complains or has symptoms of Shortness of Breath. Denies complaints or symptoms of Chronic or frequent coughs. Cardiovascular Denies complaints or symptoms of Chest pain, LE edema. Gastrointestinal Denies complaints or symptoms of Frequent diarrhea, Nausea, Vomiting. Endocrine Denies complaints or symptoms of Hepatitis, Thyroid disease, Polydypsia (Excessive Thirst). Genitourinary Denies complaints or symptoms of Kidney failure/ Dialysis, Incontinence/dribbling. Immunological Denies complaints or symptoms of Hives, Itching. Integumentary (Skin) Complains or has symptoms of Wounds, Bleeding or bruising tendency. Neurologic Denies complaints or symptoms of Numbness/parasthesias, Focal/Weakness. Oncologic Breast and Thyroid Psychiatric Denies complaints or symptoms of Anxiety, Claustrophobia. Objective Constitutional sitting or standing blood pressure is within target range for patient.. pulse regular and within target range for  patient.Marland Kitchen respirations regular, non-labored and within target range for patient.Marland Kitchen temperature within target range for patient.. Well- nourished and well-hydrated in no acute distress. Vitals Time Taken: 2:24 PM, Height: 60 in, Weight: 130 lbs, BMI: 25.4, Temperature: 98.7 F, Pulse: 96 bpm, Respiratory Rate: 16 breaths/min, Blood Pressure: 108/67 mmHg. Eyes conjunctiva clear no eyelid edema noted. pupils equal round and reactive to light and accommodation. Ears, Nose, Mouth, and Throat no gross abnormality of ear auricles or external auditory canals. normal hearing noted during conversation. mucus membranes moist. Respiratory normal breathing without difficulty. clear to auscultation bilaterally. Cardiovascular regular rate and rhythm with normal S1, S2. Nonpitting lymphedema of the bilateral LEs. Gastrointestinal (GI) soft, non-tender, non-distended, +BS. no ventral hernia noted. Chura, Ara L. ([id number]) Musculoskeletal Patient unable to walk without assistance. Patient has significant scoliosis and kyphosis noted on examination.Marland Kitchen Psychiatric this patient is able to make decisions and demonstrates good insight into disease process. Alert and Oriented x 3. pleasant and cooperative. General Notes: Upon evaluation today patient actually appears to have no wounds of the bilateral lower extremities although she does have evidence of stage II lymphedema and venous  stasis. Fortunately there's no signs of active infection at this time. With regard to the sacral wound she actually has a fairly clean appearing wound bed. There are some drainage noted although I think that this is not too significant with appropriate dressings I believe it would do quite well. With regard to her lower extremities I think the main thing she's back now is compression therapy bilaterally to prevent further ulcerations and injury. Integumentary (Hair, Skin) Wound #1 status is Open. Original cause of wound was  Gradually Appeared. The wound is located on the Midline Sacrum. The wound measures 2.2cm length x 0.7cm width x 0.2cm depth; 1.21cm^2 area and 0.242cm^3 volume. There is Fat Layer (Subcutaneous Tissue) Exposed exposed. There is no tunneling or undermining noted. There is a small amount of serous drainage noted. The wound margin is epibole. There is large (67-100%) pink granulation within the wound bed. Assessment Active Problems ICD-10 Pressure ulcer of sacral region, stage 3 Lymphedema, not elsewhere classified Venous insufficiency (chronic) (peripheral) Essential (primary) hypertension Chronic obstructive pulmonary disease, unspecified Procedures Wound #1 Pre-procedure diagnosis of Wound #1 is a Pressure Ulcer located on the Midline Sacrum . There was a Chemical/Enzymatic/Mechanical debridement performed by STONE III, Tamecka Milham E., PA-C. With the following instrument(s): gauze and saline after achieving pain control using Lidocaine 4% Topical Solution. Other agent used was saline. A time out was conducted at 15:16, prior to the start of the procedure. A Minimum amount of bleeding was controlled with Pressure. The procedure was tolerated well with a pain level of 0 throughout and a pain level of 0 following the procedure. Post Debridement Measurements: 2.2cm length x 0.7cm width x 0.2cm depth; 0.242cm^3 volume. Post debridement Stage noted as Category/Stage III. Character of Wound/Ulcer Post Debridement is improved. Post procedure Diagnosis Wound #1: Same as Pre-Procedure Sellin, Kyndall L. ([id number]) Plan Wound Cleansing: Wound #1 Midline Sacrum: Dial antibacterial soap, wash wounds, rinse and pat dry prior to dressing wounds May Shower, gently pat wound dry prior to applying new dressing. Primary Wound Dressing: Wound #1 Midline Sacrum: Silver Alginate Secondary Dressing: Wound #1 Midline Sacrum: Boardered Foam Dressing Dressing Change Frequency: Wound #1 Midline Sacrum: Change  Dressing Monday, Wednesday, Friday Follow-up Appointments: Wound #1 Midline Sacrum: Return Appointment in 1 week. Edema Control: Elevate legs to the level of the heart and pump ankles as often as possible Home Health: Wound #1 Midline Sacrum: [place] for [place] Nurse may visit PRN to address patient s wound care needs. FACE TO FACE ENCOUNTER: MEDICARE and MEDICAID PATIENTS: I certify that this patient is under my care and that I had a face-to-face encounter that meets the physician face-to-face encounter requirements with this patient on this date. The encounter with the patient was in whole or in part for the following MEDICAL CONDITION: (primary reason for [place]) MEDICAL NECESSITY: I certify, that based on my findings, NURSING services are a medically necessary home health service. HOME BOUND STATUS: I certify that my clinical findings support that this patient is homebound (i.e., Due to illness or injury, pt requires aid of supportive devices such as crutches, cane, wheelchairs, walkers, the use of special transportation or the assistance of another person to leave their place of residence. There is a normal inability to leave the home and doing so requires considerable and taxing effort. Other absences are for medical reasons / religious services and are infrequent or of short duration when for other reasons). If current dressing causes regression in wound condition,  may D/C ordered dressing product/s and apply Normal Saline Moist Dressing daily until next [place] / Other MD appointment. [place] of regression in wound condition at [phone]. Please direct any NON-WOUND related issues/requests for orders to patient's Primary Care Physician I'm gonna recommend that we go ahead and initiate the above wound care measures for the next week which includes is surrounded dressing for the sacral region as well as  recommending compression stockings for the bilateral lower extremities. We will subsequently see were things stand at follow-up. I do not see any signs of infection for the bilateral lower extremities at this point she is wrapping up her doxycycline treatment tomorrow after that I don't think she needs anything else at this time. That can always change in the future if symptoms worsen or the erythema deepens and becomes hot to touch that is not the case right now. I did also have a lengthy discussion about offloading understand that she does have difficulty breathing and therefore somewhat difficult for her to appropriately offload with regard to her sacral region. Nonetheless I think this is gonna be very important even if she's able to get a pillow under one side or the other to relieve some pressure subsequently she may also benefit from it every phone pad in order to help offload as well. With regard to her legs it does sound like they have a slight ability to elevate her legs although she cannot actually sit back in the recliner. Unless I recommended that they try to elevate as much as possible and have her new room to walk as much as possible. We're gonna see about getting home health that amounted to help with dressing changes in wound care. Please see above for specific wound care orders. We will see patient for re-evaluation in 1 week(s) here in the clinic. If anything worsens or changes patient will contact our office for additional recommendations. SANAIYA, WELLIVER ([id number]) Electronic Signature(s) Signed: 05/21/2019 5:31:10 PM By: Gretta Cool, BSN, RN, CWS, Kim RN, BSN Signed: 05/30/2019 11:42:18 PM By: [name] PA-C Previous Signature: 05/14/2019 4:11:23 PM Version By: [name] PA-C Entered By: Gretta Cool BSN, RN, CWS, Kim on 05/21/2019 17:31:10 Pursley, Irven Baltimore ([id number]) -------------------------------------------------------------------------------- ROS/PFSH Details Patient  Name: Joyce [name], [name] Date of Service: 05/14/2019 2:15 PM Medical Record Number: [id number] Patient Account Number: [id number] Date of Birth/Sex: 1928-10-15 (85 y.o. F) Treating RN: Cornell Barman Primary Care Provider: [name], [name] Other Clinician: Referring Provider: [name], [name] Treating Provider/Extender: Melburn Hake, Granvil Djordjevic Weeks in Treatment: 0 Information Obtained From Patient Eyes Complaints and Symptoms: Negative for: Dry Eyes; Vision Changes; Glasses / Contacts Ear/Nose/Mouth/Throat Complaints and Symptoms: Negative for: Difficult clearing ears; Sinusitis Hematologic/Lymphatic Complaints and Symptoms: Negative for: Bleeding / Clotting Disorders; Human Immunodeficiency Virus Respiratory Complaints and Symptoms: Positive for: Shortness of Breath Negative for: Chronic or frequent coughs Medical History: Positive for: Chronic Obstructive Pulmonary Disease (COPD) Cardiovascular Complaints and Symptoms: Negative for: Chest pain; LE edema Gastrointestinal Complaints and Symptoms: Negative for: Frequent diarrhea; Nausea; Vomiting Endocrine Complaints and Symptoms: Negative for: Hepatitis; Thyroid disease; Polydypsia (Excessive Thirst) Genitourinary Complaints and Symptoms: Negative for: Kidney failure/ Dialysis; Incontinence/dribbling Immunological Horrell, Teya L. ([id number]) Complaints and Symptoms: Negative for: Hives; Itching Integumentary (Skin) Complaints and Symptoms: Positive for: Wounds; Bleeding or bruising tendency Medical History: Negative for: History of pressure wounds Neurologic Complaints and Symptoms: Negative for: Numbness/parasthesias; Focal/Weakness Psychiatric Complaints and Symptoms: Negative for: Anxiety; Claustrophobia Musculoskeletal Medical History: Positive for: Osteoarthritis -  hands, knees, sholders Oncologic Complaints and Symptoms: Review of System Notes: Breast and Thyroid Medical History: Negative for: Received  Chemotherapy; Received Radiation Immunizations Pneumococcal Vaccine: Received Pneumococcal Vaccination: Yes Implantable Devices None Family and Social History Cancer: Yes - Child; Diabetes: Yes - Siblings; Heart Disease: No; Hypertension: Yes - Siblings; Kidney Disease: No; Lung Disease: No; Seizures: No; Stroke: Yes - Mother; Thyroid Problems: No; Tuberculosis: No; Never smoker; Marital Status - Widowed; Alcohol Use: Rarely; Caffeine Use: Daily Electronic Signature(s) Signed: 05/14/2019 4:11:23 PM By: [name] PA-C Signed: 05/14/2019 5:05:22 PM By: Gretta Cool, BSN, RN, CWS, Kim RN, BSN Entered By: Gretta Cool, BSN, RN, CWS, Kim on 05/14/2019 14:31:03 Nettie, Irven [place] ([id number]) -------------------------------------------------------------------------------- SuperBill Details Patient Name: Joyce [name], [name] Date of Service: 05/14/2019 Medical Record Number: [id number] Patient Account Number: [id number] Date of Birth/Sex: 04-03-1928 (85 y.o. F) Treating RN: Montey Hora Primary Care Provider: [name], [name] Other Clinician: Referring Provider: [name] Treating Provider/Extender: Melburn Hake, Kimbery Harwood Weeks in Treatment: 0 Diagnosis Coding ICD-10 Codes Code Description L89.152 Pressure ulcer of sacral region, stage 2 I89.0 Lymphedema, not elsewhere classified I87.2 Venous insufficiency (chronic) (peripheral) I10 Essential (primary) hypertension J44.9 Chronic obstructive pulmonary disease, unspecified Facility Procedures CPT4 Code: [id number] Description: [phone] - [place] W/O ANES NON SELECT Modifier: Quantity: 1 Physician Procedures CPT4 Code: 3817711 Description: 65790 - WC PHYS LEVEL 4 - NEW PT ICD-10 Diagnosis Description L89.152 Pressure ulcer of sacral region, stage 2 I89.0 Lymphedema, not elsewhere classified I87.2 Venous insufficiency (chronic) (peripheral) I10 Essential (primary) hypertension Modifier: Quantity: 1 Electronic Signature(s) Signed: 05/17/2019 11:23:47  AM By: Sharon Mt Signed: 05/20/2019 11:33:27 PM By: [name] PA-C Previous Signature: 05/14/2019 4:11:23 PM Version By: [name] PA-C Entered By: Sharon Mt on 05/17/2019 11:23:47

## 2019-05-18 ENCOUNTER — Other Ambulatory Visit: Payer: Medicare Other | Admitting: Nurse Practitioner

## 2019-05-18 ENCOUNTER — Other Ambulatory Visit: Payer: Self-pay

## 2019-05-18 ENCOUNTER — Encounter: Payer: Self-pay | Admitting: Nurse Practitioner

## 2019-05-18 DIAGNOSIS — Z515 Encounter for palliative care: Secondary | ICD-10-CM | POA: Diagnosis not present

## 2019-05-18 DIAGNOSIS — R630 Anorexia: Secondary | ICD-10-CM | POA: Diagnosis not present

## 2019-05-18 DIAGNOSIS — R413 Other amnesia: Secondary | ICD-10-CM

## 2019-05-18 NOTE — Progress Notes (Signed)
[facility] Palliative Care Consult Note Telephone: [phone]  Fax: [phone]  PATIENT NAME: Joyce [name] DOB: January 06, 1928 MRN: [id number]  PRIMARY CARE PROVIDER:   Jerrol Banana., MD  REFERRING PROVIDER:  Jerrol Banana., MD [address] [place],  [place] [address]  RESPONSIBLE PARTY:   [name] daughter [phone]  Due to the COVID-19 crisis, this visit was done via telemedicine from my office and it was initiated and consent by this patient and or family.  RECOMMENDATIONS and PLAN:  1. Palliative care encounter Z51.5; Palliative medicine team will continue to support patient, patient's family, and medical team. Visit consisted of counseling and education dealing with the complex and emotionally intense issues of symptom management and palliative care in the setting of serious and potentially life-threatening illness  2. Memory loss R41.3 appears progressive. Medical goals to continue to focus on Comfort, redirecting with supportive measures.  3. Anorexia R63.0 appetite improving over this few weeks. Continue to encourage supplements and comfort feedings. Discuss nutrition  ASSESSMENT:     I called Ms Knotts's daughter, [name] for schedule palliative care follow-up visit. We talked about purpose for palliative care visit. We talked about Ms Lampley's visit to the wound care center. [name] endorses she was told that it was not cellulitis it was more of an inflammation process and recommended compression hose for edema. We talked about her wound on her sacrum. [name] endorses she feels like there's no need to continue to return to wound care center at this time. We talked about previous palliative care discussion. We talked about role of palliative care and plan of care. We talked about Miss Schnyder's chronic disease progression in the setting of natural aging. We talked about edema and dependent edema. We talked about  option of Hospice Services to perform wound care based on wound care center recommendations. We talked about medical goals of care including aggressive versus conservative versus Comfort Care. We talked about code status in [place] endorses that Ms Storts is a DNR. Discuss that DNR form is not in [place] [facility] [place] and asked if it would be okay to complete a new DNR form, put an epic and think up, mail to [place]. [name] sure that she will email the current DNR form she has rather than redoing one. [name] in agreement to put an [place]. [name] endorses she would like to think about hospice. [name] was open to rescheduling a follow-up palliative care visit to continue to monitor and follow with focus on comfort. Appointment scheduled. Therapeutic listening and emotional support provided. Contact information. Questions answered to satisfaction  2 / 23 / 2020 weight 54.9 kg/121 lbs 3 / 27 / 2020 weight 51 kg/112.4 lbs 04/29/2019 weight 59.1kg/130.6 lbs  I spent 45 minutes providing this consultation,  from 11:00am to 11:45am. More than 50% of the time in this consultation was spent coordinating communication.   HISTORY OF PRESENT ILLNESS:  Joyce [name] is a 85 y.o. year old female with multiple medical problems including COPD, enlarged aorta, breast cancer s/p, thyroid cancer s/p thyroidectomy, peptic ulcer disease, hypertension, GERD, asthma, anxiety. Severe kyphosis She lived in [place] independently nine months ago. Her daughter moved her to [place] to be closer to her. She stopped driving about a year ago. After she moved to [place] she started having multiple hospitalizations. 2 / 20 / 2020 to 2 / 23 / 2020 hospitalized for shortness of breath recently treated for  UTI found a have multifocal pneumonia, COPD exacerbation. She did require IV antibiotics. Hyponatremia chronic in nature with sodium improve from 126 to 134 upon discharge with IV fluids. Hospitalized 3 / 15 / 2020 to 3 / 17 / 2020  hospitalized for coarsening right leg cellulitis for which primary provider have been treating out patient with Amoxicillin. Cellulitis reoccuring with right lower extremity edema. Ultrasound negative for diabetes. Recommendation from wound consult dressing with santyl and no compression therapy until cellulitis is resolved. She was discharged to her daughter's home. She was readmitted 3 / 27 / 2020 for lower extremity edema swelling recurrent Falls, worsening cough and congestion. Acute right sided recurrent pneumonia concern for aspiration treated with antibiotic therapy. Acute left lower extremity cellulitis resolving negative for DVT. Acute hypotension with a history of hypertension resolved when discontinued lisinopril. Cardizem was continued. Severe protein calorie malnutrition continued meal supplement. She was discharged home with her daughter. Palliative Care was asked to help address goals of care.   CODE STATUS: DNR  PPS: 40% HOSPICE ELIGIBILITY/DIAGNOSIS: TBD  PAST MEDICAL HISTORY:  Past Medical History:  Diagnosis Date  . Anxiety   . Arthritis   . Asthma   . Asthma   . Breast cancer ([facility])   . Cancer ([facility])   . COPD (chronic obstructive pulmonary disease) ([facility])   . Enlarged aorta ([facility])   . GERD (gastroesophageal reflux disease)   . High cholesterol   . Hx of skin cancer, basal cell   . Hyperlipemia   . Hypertension   . PUD (peptic ulcer disease)   . Thyroid cancer ([facility])     SOCIAL HX:  Social History   Tobacco Use  . Smoking status: Never Smoker  . Smokeless tobacco: Never Used  Substance Use Topics  . Alcohol use: Yes    Alcohol/week: 1.0 standard drinks    Types: 1 Cans of beer per week    Comment: occasional beer, once a year    ALLERGIES: No Known Allergies   PERTINENT MEDICATIONS:  Outpatient Encounter Medications as of 05/18/2019  Medication Sig  . acetaminophen (TYLENOL) 325 MG tablet Take 650 mg by mouth every 6 (six) hours as needed. Taking two tablets  every morning.  Marland Kitchen acidophilus (RISAQUAD) CAPS capsule Take 1 capsule by mouth daily.  Marland Kitchen albuterol (PROVENTIL HFA;VENTOLIN HFA) 108 (90 Base) MCG/ACT inhaler Inhale 1-2 puffs into the lungs every 4 (four) hours as needed for wheezing or shortness of breath.  Marland Kitchen albuterol (PROVENTIL) (2.5 MG/3ML) 0.083% nebulizer solution Take 3 mLs (2.5 mg total) by nebulization every 6 (six) hours as needed for wheezing or shortness of breath.  Marland Kitchen alendronate (FOSAMAX) 70 MG tablet Take 1 tablet (70 mg total) by mouth once a week. Take with a full glass of water on an empty stomach.  Marland Kitchen atorvastatin (LIPITOR) 10 MG tablet Take 1 tablet (10 mg total) by mouth daily.  . calcium carbonate (TUMS - DOSED IN MG ELEMENTAL CALCIUM) 500 MG chewable tablet Chew 2 tablets by mouth daily.   . cholecalciferol (VITAMIN D) 1000 units tablet Take 2,000 Units by mouth daily.  . collagenase (SANTYL) ointment Apply topically daily.  Marland Kitchen diltiazem (DILACOR XR) 120 MG 24 hr capsule Take 1 capsule (120 mg total) by mouth daily. Take 2 capsules by mouth every morning, and 1 capsule every evening. (Patient taking differently: Take 120 mg by mouth as directed. Take 2 capsules by mouth every morning, and 1 capsule every evening.)  . doxycycline (VIBRA-TABS) 100 MG tablet Take  1 tablet (100 mg total) by mouth 2 (two) times daily.  . feeding supplement, ENSURE ENLIVE, (ENSURE ENLIVE) LIQD Take 237 mLs by mouth 2 (two) times daily between meals.  . Fluticasone-Salmeterol (ADVAIR) 500-50 MCG/DOSE AEPB Inhale 1 puff into the lungs 2 (two) times daily.   Marland Kitchen levothyroxine (SYNTHROID, LEVOTHROID) 150 MCG tablet Take 1 tablet (150 mcg total) by mouth daily before breakfast.  . LORazepam (ATIVAN) 0.5 MG tablet Take 0.5 tablets (0.25 mg total) by mouth 3 (three) times daily as needed for anxiety.  . mirtazapine (REMERON) 15 MG tablet Take 1 tablet (15 mg total) by mouth at bedtime.  . Multiple Vitamin (MULTIVITAMIN WITH MINERALS) TABS tablet Take 1 tablet by  mouth daily.   No facility-administered encounter medications on file as of 05/18/2019.     PHYSICAL EXAM:  Deferred   Z , NP

## 2019-05-25 ENCOUNTER — Encounter: Payer: Medicare Other | Admitting: Physician Assistant

## 2019-05-25 ENCOUNTER — Other Ambulatory Visit: Payer: Self-pay

## 2019-05-25 DIAGNOSIS — I872 Venous insufficiency (chronic) (peripheral): Secondary | ICD-10-CM | POA: Diagnosis not present

## 2019-05-25 DIAGNOSIS — L89153 Pressure ulcer of sacral region, stage 3: Secondary | ICD-10-CM | POA: Diagnosis not present

## 2019-05-25 DIAGNOSIS — I10 Essential (primary) hypertension: Secondary | ICD-10-CM | POA: Diagnosis not present

## 2019-05-25 DIAGNOSIS — L89159 Pressure ulcer of sacral region, unspecified stage: Secondary | ICD-10-CM | POA: Diagnosis not present

## 2019-05-25 DIAGNOSIS — J449 Chronic obstructive pulmonary disease, unspecified: Secondary | ICD-10-CM | POA: Diagnosis not present

## 2019-05-25 DIAGNOSIS — I890 Lymphedema, not elsewhere classified: Secondary | ICD-10-CM | POA: Diagnosis not present

## 2019-05-26 NOTE — Progress Notes (Signed)
BARBRA, MINER ([id number]) Visit Report for 05/25/2019 Arrival Information Details Patient Name: Joyce [name], [name] Date of Service: 05/25/2019 12:30 PM Medical Record Number: [id number] Patient Account Number: [id number] Date of Birth/Sex: 08/13/1928 (85 y.o. F) Treating RN: Montey Hora Primary Care Kathy Wahid: [name], [name] Other Clinician: Referring Laiklynn Raczynski: [name] Treating Dillan Candela/Extender: Melburn Hake, HOYT Weeks in Treatment: 1 Visit Information History Since Last Visit Added or deleted any medications: No Patient Arrived: Walker Any new allergies or adverse reactions: No Arrival Time: 12:38 Had a fall or experienced change in No Accompanied By: daughter activities of daily living that may affect Transfer Assistance: None risk of falls: Patient Identification Verified: Yes Signs or symptoms of abuse/neglect since last visito No Secondary Verification Process Completed: Yes Hospitalized since last visit: No Patient Requires Transmission-Based No Implantable device outside of the clinic excluding No Precautions: cellular tissue based products placed in the center Patient Has Alerts: Yes since last visit: Patient Alerts: Not Has Dressing in Place as Prescribed: No Diabetic Pain Present Now: No Electronic Signature(s) Signed: 05/25/2019 2:24:05 PM By: [name] RCP, RRT, CHT Entered By: [name] on 05/25/2019 12:39:15 Hawks, Gaia L. ([id number]) -------------------------------------------------------------------------------- Clinic Level of Care Assessment Details Patient Name: Bartolomei, Adyson L. Date of Service: 05/25/2019 12:30 PM Medical Record Number: [id number] Patient Account Number: [id number] Date of Birth/Sex: [birth year] (85 y.o. F) Treating RN: Montey Hora Primary Care Sameen Leas: [name], [name] Other Clinician: Referring Reinette Cuneo: [name] Treating Cierra Rothgeb/Extender: Melburn Hake, HOYT Weeks  in Treatment: 1 Clinic Level of Care Assessment Items TOOL 4 Quantity Score []  - Use when only an EandM is performed on FOLLOW-UP visit 0 ASSESSMENTS - Nursing Assessment / Reassessment X - Reassessment of Co-morbidities (includes updates in patient status) 1 10 X- 1 5 Reassessment of Adherence to Treatment Plan ASSESSMENTS - Wound and Skin Assessment / Reassessment X - Simple Wound Assessment / Reassessment - one wound 1 5 []  - 0 Complex Wound Assessment / Reassessment - multiple wounds X- 1 10 Dermatologic / Skin Assessment (not related to wound area) ASSESSMENTS - Focused Assessment []  - Circumferential Edema Measurements - multi extremities 0 []  - 0 Nutritional Assessment / Counseling / Intervention []  - 0 Lower Extremity Assessment (monofilament, tuning fork, pulses) []  - 0 Peripheral Arterial Disease Assessment (using hand held doppler) ASSESSMENTS - Ostomy and/or Continence Assessment and Care []  - Incontinence Assessment and Management 0 []  - 0 Ostomy Care Assessment and Management (repouching, etc.) PROCESS - Coordination of Care X - Simple Patient / Family Education for ongoing care 1 15 []  - 0 Complex (extensive) Patient / Family Education for ongoing care X- 1 10 Staff obtains Programmer, systems, Records, Test Results / Process Orders []  - 0 Staff telephones HHA, Nursing Homes / Clarify orders / etc []  - 0 Routine Transfer to another Facility (non-emergent condition) []  - 0 Routine Hospital Admission (non-emergent condition) []  - 0 New Admissions / Biomedical engineer / Ordering NPWT, Apligraf, etc. []  - 0 Emergency Hospital Admission (emergent condition) X- 1 10 Simple Discharge Coordination Panzer, Astou L. ([id number]) []  - 0 Complex (extensive) Discharge Coordination PROCESS - Special Needs []  - Pediatric / Minor Patient Management 0 []  - 0 Isolation Patient Management []  - 0 Hearing / Language / Visual special needs []  - 0 Assessment of Community  assistance (transportation, D/C planning, etc.) []  - 0 Additional assistance / Altered mentation []  - 0 Support Surface(s) Assessment (bed, cushion, seat, etc.) INTERVENTIONS - Wound Cleansing / Measurement X -  Simple Wound Cleansing - one wound 1 5 []  - 0 Complex Wound Cleansing - multiple wounds X- 1 5 Wound Imaging (photographs - any number of wounds) []  - 0 Wound Tracing (instead of photographs) X- 1 5 Simple Wound Measurement - one wound []  - 0 Complex Wound Measurement - multiple wounds INTERVENTIONS - Wound Dressings X - Small Wound Dressing one or multiple wounds 1 10 []  - 0 Medium Wound Dressing one or multiple wounds []  - 0 Large Wound Dressing one or multiple wounds []  - 0 Application of Medications - topical []  - 0 Application of Medications - injection INTERVENTIONS - Miscellaneous []  - External ear exam 0 []  - 0 Specimen Collection (cultures, biopsies, blood, body fluids, etc.) []  - 0 Specimen(s) / Culture(s) sent or taken to Lab for analysis []  - 0 Patient Transfer (multiple staff / Civil Service fast streamer / Similar devices) []  - 0 Simple Staple / Suture removal (25 or less) []  - 0 Complex Staple / Suture removal (26 or more) []  - 0 Hypo / Hyperglycemic Management (close monitor of Blood Glucose) []  - 0 Ankle / Brachial Index (ABI) - do not check if billed separately X- 1 5 Vital Signs Mccampbell, Jammy L. ([id number]) Has the patient been seen at the hospital within the last three years: Yes Total Score: 95 Level Of Care: New/Established - Level 3 Electronic Signature(s) Signed: 05/25/2019 4:59:24 PM By: Montey Hora Entered By: Montey Hora on 05/25/2019 13:10:30 Certain, Antonella L. ([id number]) -------------------------------------------------------------------------------- Encounter Discharge Information Details Patient Name: Mcghee, Kerria L. Date of Service: 05/25/2019 12:30 PM Medical Record Number: [id number] Patient Account Number: [id number] Date of  Birth/Sex: Mar 20, 1928 (85 y.o. F) Treating RN: Montey Hora Primary Care Kohana Amble: [name], [name] Other Clinician: Referring Johana Hopkinson: [name] Treating Mel Langan/Extender: Sharalyn Ink in Treatment: 1 Encounter Discharge Information Items Discharge Condition: Stable Ambulatory Status: Walker Discharge Destination: Home Transportation: Private Auto Accompanied By: daughter Schedule Follow-up Appointment: Yes Clinical Summary of Care: Electronic Signature(s) Signed: 05/25/2019 4:59:24 PM By: Montey Hora Entered By: Montey Hora on 05/25/2019 13:12:09 Santoli, Delisha L. ([id number]) -------------------------------------------------------------------------------- Lower Extremity Assessment Details Patient Name: Sobek, Yalexa L. Date of Service: 05/25/2019 12:30 PM Medical Record Number: [id number] Patient Account Number: [id number] Date of Birth/Sex: Mar 11, 1928 (85 y.o. F) Treating RN: Army Melia Primary Care Heavenly Christine: [name] Other Clinician: Referring Myha Arizpe: [name], RICHARD Treating Lam Bjorklund/Extender: Melburn Hake, HOYT Weeks in Treatment: 1 Electronic Signature(s) Signed: 05/25/2019 3:27:22 PM By: Army Melia Entered By: Army Melia on 05/25/2019 12:49:20 Twedt, Jacqulin L. ([id number]) -------------------------------------------------------------------------------- Multi Wound Chart Details Patient Name: Reeg, Angenette L. Date of Service: 05/25/2019 12:30 PM Medical Record Number: [id number] Patient Account Number: [id number] Date of Birth/Sex: 04/23/1928 (85 y.o. F) Treating RN: Montey Hora Primary Care Darleen Moffitt: [name], [name] Other Clinician: Referring Johnny Latu: [name], [name] Treating Eurika Sandy/Extender: Melburn Hake, HOYT Weeks in Treatment: 1 Vital Signs Height(in): 60 Pulse(bpm): 89 Weight(lbs): 130 Blood Pressure(mmHg): 136/55 Body Mass Index(BMI): 25 Temperature(F): 98.2 Respiratory  Rate 16 (breaths/min): Photos: [N/A:N/A] Wound Location: Sacrum - Midline N/A N/A Wounding Event: Gradually Appeared N/A N/A Primary Etiology: Pressure Ulcer N/A N/A Comorbid History: Chronic Obstructive N/A N/A Pulmonary Disease (COPD), Osteoarthritis Date Acquired: 12/28/2018 N/A N/A Weeks of Treatment: 1 N/A N/A Wound Status: Open N/A N/A Measurements L x W x D 3x1x0.3 N/A N/A (cm) Area (cm) : 2.356 N/A N/A Volume (cm) : 0.707 N/A N/A % Reduction in Area: -94.70% N/A N/A % Reduction in Volume: -192.10% N/A N/A Starting Position 1  12 (o'clock): Ending Position 1 1 (o'clock): Maximum Distance 1 (cm): 0.3 Undermining: Yes N/A N/A Classification: Category/Stage III N/A N/A Exudate Amount: Small N/A N/A Exudate Type: Serous N/A N/A Exudate Color: amber N/A N/A Wound Margin: Epibole N/A N/A Granulation Amount: Large (67-100%) N/A N/A Granulation Quality: Pink N/A N/A Necrotic Amount: Small (1-33%) N/A N/A Marmolejos, Gianni L. ([id number]) Exposed Structures: Fat Layer (Subcutaneous N/A N/A Tissue) Exposed: Yes Fascia: No Tendon: No Muscle: No Joint: No Bone: No Epithelialization: None N/A N/A Treatment Notes Electronic Signature(s) Signed: 05/25/2019 4:59:24 PM By: Montey Hora Entered By: Montey Hora on 05/25/2019 12:59:47 Kaucher, Caytlyn L. ([id number]) -------------------------------------------------------------------------------- [facility] Details Patient Name: Montanye, Davis L. Date of Service: 05/25/2019 12:30 PM Medical Record Number: [id number] Patient Account Number: [id number] Date of Birth/Sex: 1928-04-26 (85 y.o. F) Treating RN: Montey Hora Primary Care Mikiya Nebergall: [name], [name] Other Clinician: Referring Virlan Kempker: [name] Treating Brie Eppard/Extender: Melburn Hake, HOYT Weeks in Treatment: 1 Active Inactive Abuse / Safety / Falls / Self Care Management Nursing Diagnoses: Potential for falls Goals: Patient will not  experience any injury related to falls Date Initiated: 05/14/2019 Target Resolution Date: 08/14/2019 Goal Status: Active Interventions: Assess fall risk on admission and as needed Notes: Orientation to the Wound Care Program Nursing Diagnoses: Knowledge deficit related to the wound healing center program Goals: Patient/caregiver will verbalize understanding of the [facility] Program Date Initiated: 05/14/2019 Target Resolution Date: 08/14/2019 Goal Status: Active Interventions: Provide education on orientation to the wound center Notes: Pressure Nursing Diagnoses: Potential for impaired tissue integrity related to pressure, friction, moisture, and shear Goals: Patient will remain free from development of additional pressure ulcers Date Initiated: 05/14/2019 Target Resolution Date: 08/14/2019 Goal Status: Active Interventions: Provide education on pressure ulcers Dehaan, Rozella L. ([id number]) Notes: Wound/Skin Impairment Nursing Diagnoses: Impaired tissue integrity Goals: Ulcer/skin breakdown will heal within 14 weeks Date Initiated: 05/14/2019 Target Resolution Date: 08/14/2019 Goal Status: Active Interventions: Assess patient/caregiver ability to obtain necessary supplies Assess patient/caregiver ability to perform ulcer/skin care regimen upon admission and as needed Assess ulceration(s) every visit Notes: Electronic Signature(s) Signed: 05/25/2019 4:59:24 PM By: Montey Hora Entered By: Montey Hora on 05/25/2019 12:59:38 Dessert, Tyffani L. ([id number]) -------------------------------------------------------------------------------- Pain Assessment Details Patient Name: Severs, Stephaie L. Date of Service: 05/25/2019 12:30 PM Medical Record Number: [id number] Patient Account Number: [id number] Date of Birth/Sex: [birth year] (85 y.o. F) Treating RN: Montey Hora Primary Care Cimberly Stoffel: [name], [name] Other Clinician: Referring Chanee Henrickson: [name] Treating Ainara Eldridge/Extender: Melburn Hake, HOYT Weeks in Treatment: 1 Active Problems Location of Pain Severity and Description of Pain Patient Has Paino No Site Locations Pain Management and Medication Current Pain Management: Electronic Signature(s) Signed: 05/25/2019 2:24:05 PM By: Paulla Fore, RRT, CHT Signed: 05/25/2019 4:59:24 PM By: Montey Hora Entered By: [name] on 05/25/2019 12:39:24 Miles, Kaleigh L. ([id number]) -------------------------------------------------------------------------------- Patient/Caregiver Education Details Patient Name: Turnage, Charlyne L. Date of Service: 05/25/2019 12:30 PM Medical Record Number: [id number] Patient Account Number: [id number] Date of Birth/Gender: 1928/10/09 (85 y.o. F) Treating RN: Montey Hora Primary Care Physician: [name], [name] Other Clinician: Referring Physician: [name] Treating Physician/Extender: Sharalyn Ink in Treatment: 1 Education Assessment Education Provided To: Patient and Caregiver Education Topics Provided Pressure: Handouts: Other: pressure relief measures Methods: Explain/Verbal Responses: State content correctly Wound/Skin Impairment: Handouts: Other: wound care as ordered Methods: Demonstration, Explain/Verbal Responses: State content correctly Electronic Signature(s) Signed: 05/25/2019 4:59:24 PM By: Montey Hora Entered By: Montey Hora on 05/25/2019 13:11:21  Dechert, London L. ([id number]) -------------------------------------------------------------------------------- Wound Assessment Details Patient Name: Westenberger, Rileyann L. Date of Service: 05/25/2019 12:30 PM Medical Record Number: [id number] Patient Account Number: [id number] Date of Birth/Sex: Feb 02, 1928 (85 y.o. F) Treating RN: Army Melia Primary Care Marcus Groll: [name], [name] Other Clinician: Referring Bulah Lurie: [name] Treating Garrette Caine/Extender: Melburn Hake, HOYT Weeks in Treatment: 1 Wound Status Wound Number: 1 Primary Pressure Ulcer Etiology: Wound Location: Sacrum - Midline Wound Status: Open Wounding Event: Gradually Appeared Comorbid Chronic Obstructive Pulmonary Disease Date Acquired: 12/28/2018 History: (COPD), Osteoarthritis Weeks Of Treatment: 1 Clustered Wound: No Photos Wound Measurements Length: (cm) 3 % Reduction i Width: (cm) 1 % Reduction i Depth: (cm) 0.3 Epithelializa Area: (cm) 2.356 Tunneling: Volume: (cm) 0.707 Undermining: Starting P Ending Pos Maximum Di n Area: -94.7% n Volume: -192.1% tion: None No Yes osition (o'clock): 12 ition (o'clock): 1 stance: (cm) 0.3 Wound Description Classification: Category/Stage III Foul Odor Aft Wound Margin: Epibole Slough/Fibrin Exudate Amount: Small Exudate Type: Serous Exudate Color: amber er Cleansing: No o No Wound Bed Granulation Amount: Large (67-100%) Exposed Structure Granulation Quality: Pink Fascia Exposed: No Necrotic Amount: Small (1-33%) Fat Layer (Subcutaneous Tissue) Exposed: Yes Necrotic Quality: Adherent Slough Tendon Exposed: No Muscle Exposed: No Joint Exposed: No Salsbury, Maisy L. ([id number]) Bone Exposed: No Treatment Notes Wound #1 (Midline Sacrum) Notes silvercel, BFD Electronic Signature(s) Signed: 05/25/2019 3:27:22 PM By: Army Melia Entered By: Army Melia on 05/25/2019 12:48:27 Senna, Eldene L. ([id number]) -------------------------------------------------------------------------------- Vitals Details Patient Name: Stegman, Lindley L. Date of Service: 05/25/2019 12:30 PM Medical Record Number: [id number] Patient Account Number: [id number] Date of Birth/Sex: 19-Dec-1927 (85 y.o. F) Treating RN: Montey Hora Primary Care Esmeralda Blanford: [name], [name] Other Clinician: Referring Ayisha Pol: [name] Treating Taiwana Willison/Extender: Melburn Hake, HOYT Weeks in Treatment: 1 Vital Signs Time Taken: 12:39 Temperature  (F): 98.2 Height (in): 60 Pulse (bpm): 89 Weight (lbs): 130 Respiratory Rate (breaths/min): 16 Body Mass Index (BMI): 25.4 Blood Pressure (mmHg): 136/55 Reference Range: 80 - 120 mg / dl Electronic Signature(s) Signed: 05/25/2019 2:24:05 PM By: [name] RCP, RRT, CHT Entered By: [name] on 05/25/2019 12:43:01

## 2019-05-27 ENCOUNTER — Telehealth: Payer: Self-pay

## 2019-05-27 DIAGNOSIS — L89153 Pressure ulcer of sacral region, stage 3: Secondary | ICD-10-CM | POA: Diagnosis not present

## 2019-05-27 DIAGNOSIS — M19012 Primary osteoarthritis, left shoulder: Secondary | ICD-10-CM | POA: Diagnosis not present

## 2019-05-27 DIAGNOSIS — I890 Lymphedema, not elsewhere classified: Secondary | ICD-10-CM | POA: Diagnosis not present

## 2019-05-27 DIAGNOSIS — M19041 Primary osteoarthritis, right hand: Secondary | ICD-10-CM | POA: Diagnosis not present

## 2019-05-27 DIAGNOSIS — I10 Essential (primary) hypertension: Secondary | ICD-10-CM | POA: Diagnosis not present

## 2019-05-27 DIAGNOSIS — M170 Bilateral primary osteoarthritis of knee: Secondary | ICD-10-CM | POA: Diagnosis not present

## 2019-05-27 DIAGNOSIS — I872 Venous insufficiency (chronic) (peripheral): Secondary | ICD-10-CM | POA: Diagnosis not present

## 2019-05-27 DIAGNOSIS — M19011 Primary osteoarthritis, right shoulder: Secondary | ICD-10-CM | POA: Diagnosis not present

## 2019-05-27 DIAGNOSIS — J449 Chronic obstructive pulmonary disease, unspecified: Secondary | ICD-10-CM | POA: Diagnosis not present

## 2019-05-27 DIAGNOSIS — M19042 Primary osteoarthritis, left hand: Secondary | ICD-10-CM | POA: Diagnosis not present

## 2019-05-27 NOTE — Telephone Encounter (Signed)
SW contacted [name] (patient's daughter) to inquire about patient's status. [name] said patient is doing "well". Patient is going to wound clinic weekly to check her wound. Patient also has a home health nurse coming twice a week. [name] said she has ordered stockings for patient's legs and hope this will help with swelling. [name] desires to complete HCPOA/Living Will form. SW to coordinate with notary and follow-up next week to schedule.

## 2019-05-28 NOTE — Progress Notes (Addendum)
RONDELL, FRICK ([id number]) Visit Report for 05/25/2019 Chief Complaint Document Details Patient Name: Dupree, Keegan L. Date of Service: 05/25/2019 12:30 PM Medical Record Number: [id number] Patient Account Number: [id number] Date of Birth/Sex: [birth year] (85 y.o. F) Treating RN: Montey Hora Primary Care Provider: [name], [name] Other Clinician: Referring Provider: [name] Treating Provider/Extender: Melburn Hake, HOYT Weeks in Treatment: 1 Information Obtained from: Patient Chief Complaint Sacral pressure ulcer and bilateral LE lymphedema Electronic Signature(s) Signed: 05/25/2019 6:14:16 PM By: [name] PA-C Entered By: [name] on 05/25/2019 12:54:50 Bram, Danah L. ([id number]) -------------------------------------------------------------------------------- HPI Details Patient Name: Dunson, Kalandra L. Date of Service: 05/25/2019 12:30 PM Medical Record Number: [id number] Patient Account Number: [id number] Date of Birth/Sex: [birth year] (85 y.o. F) Treating RN: Montey Hora Primary Care Provider: [name], [name] Other Clinician: Referring Provider: [name] Treating Provider/Extender: [name] Weeks in Treatment: 1 History of Present Illness HPI Description: 05/14/19 on evaluation today patient appears to be doing rather well in regard to her lower extremities compared to what it sounds like she was doing not that far back. Fortunately there's no signs of active infection at this time although it does sound like she had significant cellulitis earlier in the year. She does also have a issue with a sacral ulcer upon inspection today which has been present since around February 2020 this is about as long as she's been dealing with her legs as well. She is under palliative care but not hospice. The patient does have a history of scoliosis and kyphosis which makes it difficult for her to lay flat she actually sleep sitting up in a recliner  but night and reclined at this cause her trouble breathing. She also has COPD, hypertension, and bilateral lower should be lymphedema and venous stasis. Currently there's no signs of active cellulitis of the bilateral lower extremities which is good news. The patient has not had any vascular intervention nor evaluation that we have on file. She does spend the majority per day apparently sitting in her recliner upright. This I think is an issue both for her legs as well as for the sacral region unfortunately. She could not tolerate the ABI's today due to the fact that it calls too much pain when we were performing the blood pressure reading on her lower extremities who were not even able to get a proper reading. No fevers, chills, nausea, or vomiting noted at this time. 05/25/19 on evaluation today patient appears to be doing about the same in regard to her sacral wound maybe slightly better compared to previous. Fortunately there's no signs of active infection at this time which is good news in regard to her lower extremities. She has previously had cellulitis therefore were trying to keep an eye on this as far as that's concerned. Nonetheless since I last saw her which was actually roughly 2 weeks ago since due to work her daughter was unable to bring her last week she is not had any care through home health and has not gotten compression stockings for her bilateral lower extremities. Subsequently the issue with home health seems to be multifactorial when they contacted the patient and her daughter in order to get things scheduled they were told according to the daughter that she didn't know why they needed to come out three times a week and wasn't aware that that was what the order was gonna be. The daughter tells me that that somewhat poor timing for her as she works  and doesn't have a lot of time to hang around at the house waiting for home health. With that being said I explained that the only  thing we can really do is either have home health come out and perform the dressing changes or else potentially have a family member do this. There apparently is no family member including herself who is willing to do this she tells me "that she doesn't feel qualified and comfortable". Nonetheless either way the patient has not had a dressing on the sacral area since the last time she was here she uses a pad hasn't really noted much drainage onto the pad although it does appear that the wound itself trapped a lot of the fluid within skin folds. In the end I do think that since she doesn't really have anyone to perform the dressing changes at home we are going to need to utilize the services of home health. They are in agreement with that plan. Electronic Signature(s) Signed: 05/30/2019 11:42:18 PM By: [name] PA-C Previous Signature: 05/25/2019 6:14:16 PM Version By: [name] PA-C Entered By: [name] on 05/30/2019 23:23:56 Bluestein, Desare LMarland Kitchen ([id number]) -------------------------------------------------------------------------------- Physical Exam Details Patient Name: Rede, Shamariah L. Date of Service: 05/25/2019 12:30 PM Medical Record Number: [id number] Patient Account Number: [id number] Date of Birth/Sex: [birth year] (85 y.o. F) Treating RN: Montey Hora Primary Care Provider: [name], [name] Other Clinician: Referring Provider: [name], RICHARD Treating Provider/Extender: Melburn Hake, HOYT Weeks in Treatment: 1 Constitutional Well-nourished and well-hydrated in no acute distress. Respiratory normal breathing without difficulty. clear to auscultation bilaterally. Cardiovascular regular rate and rhythm with normal S1, S2. Psychiatric this patient is able to make decisions and demonstrates good insight into disease process. Alert and Oriented x 3. pleasant and cooperative. Notes On inspection today patient's wound bed actually did appear to be doing okay there  is still some Slough noted on the surface of the wound it is still open she does have some new granulation tissue as well as epithelial tissue but again she is still dealing with an open wound that I'm concerned about getting infected if we don't keep this covered with a good dressing. Something preferably that will keep it from becoming too hyper granular such as something that would keep it more dry. Electronic Signature(s) Signed: 05/25/2019 6:14:16 PM By: [name] PA-C Entered By: [name] on 05/25/2019 17:56:31 Guizar, Irven [place] ([id number]) -------------------------------------------------------------------------------- Physician Orders Details Patient Name: Albers, Negar L. Date of Service: 05/25/2019 12:30 PM Medical Record Number: [id number] Patient Account Number: [id number] Date of Birth/Sex: Apr 30, 1928 (85 y.o. F) Treating RN: Montey Hora Primary Care Provider: [name], [name] Other Clinician: Referring Provider: [name] Treating Provider/Extender: Melburn Hake, HOYT Weeks in Treatment: 1 Verbal / Phone Orders: No Diagnosis Coding ICD-10 Coding Code Description L89.153 Pressure ulcer of sacral region, stage 3 I89.0 Lymphedema, not elsewhere classified I87.2 Venous insufficiency (chronic) (peripheral) I10 Essential (primary) hypertension J44.9 Chronic obstructive pulmonary disease, unspecified Wound Cleansing Wound #1 Midline Sacrum o Dial antibacterial soap, wash wounds, rinse and pat dry prior to dressing wounds o May Shower, gently pat wound dry prior to applying new dressing. Primary Wound Dressing Wound #1 Midline Sacrum o Silver Alginate Secondary Dressing Wound #1 Midline Sacrum o Boardered Foam Dressing Dressing Change Frequency Wound #1 Midline Sacrum o Change Dressing Monday, Wednesday, Friday Follow-up Appointments Wound #1 Midline Sacrum o Return Appointment in 1 week. Edema Control o Elevate legs to the level  of the  heart and pump ankles as often as possible o Other: - [place] #1 Midline [place] Nurse may visit PRN to address patientos wound care needs. o FACE TO FACE ENCOUNTER: MEDICARE and MEDICAID PATIENTS: I certify that this patient is under my care and that I had a face-to-face encounter that meets the physician face-to-face encounter requirements with this patient on this date. The encounter with the patient was in whole or in part for the following MEDICAL CONDITION: (primary reason for [place]) MEDICAL NECESSITY: I certify, that based on my findings, VEANNA, DOWER ([id number]) NURSING services are a medically necessary home health service. HOME BOUND STATUS: I certify that my clinical findings support that this patient is homebound (i.e., Due to illness or injury, pt requires aid of supportive devices such as crutches, cane, wheelchairs, walkers, the use of special transportation or the assistance of another person to leave their place of residence. There is a normal inability to leave the home and doing so requires considerable and taxing effort. Other absences are for medical reasons / religious services and are infrequent or of short duration when for other reasons). o If current dressing causes regression in wound condition, may D/C ordered dressing product/s and apply Normal Saline Moist Dressing daily until next [place] / Other MD appointment. [place] of regression in wound condition at [phone]. o Please direct any NON-WOUND related issues/requests for orders to patient's Primary Care Physician Electronic Signature(s) Signed: 05/25/2019 4:59:24 PM By: Montey Hora Signed: 05/25/2019 6:14:16 PM By: [name] PA-C Entered By: Montey Hora on 05/25/2019 13:13:26 Dom, Raelyn L.  ([id number]) -------------------------------------------------------------------------------- Problem List Details Patient Name: Bonito, Lynnann L. Date of Service: 05/25/2019 12:30 PM Medical Record Number: [id number] Patient Account Number: [id number] Date of Birth/Sex: 1928/09/03 (85 y.o. F) Treating RN: Montey Hora Primary Care Provider: [name], [name] Other Clinician: Referring Provider: [name], [name] Treating Provider/Extender: Sharalyn Ink in Treatment: 1 Active Problems ICD-10 Evaluated Encounter Code Description Active Date Today Diagnosis L89.153 Pressure ulcer of sacral region, stage 3 05/14/2019 No Yes I89.0 Lymphedema, not elsewhere classified 05/14/2019 No Yes I87.2 Venous insufficiency (chronic) (peripheral) 05/14/2019 No Yes I10 Essential (primary) hypertension 05/14/2019 No Yes J44.9 Chronic obstructive pulmonary disease, unspecified 05/14/2019 No Yes Inactive Problems Resolved Problems Electronic Signature(s) Signed: 05/25/2019 6:14:16 PM By: [name] PA-C Entered By: [name] on 05/25/2019 12:54:29 Bartok, Brelyn L. ([id number]) -------------------------------------------------------------------------------- Progress Note Details Patient Name: Thayer, Suzannah L. Date of Service: 05/25/2019 12:30 PM Medical Record Number: [id number] Patient Account Number: [id number] Date of Birth/Sex: January 18, 1928 (85 y.o. F) Treating RN: Montey Hora Primary Care Provider: [name], [name] Other Clinician: Referring Provider: [name] Treating Provider/Extender: Sharalyn Ink in Treatment: 1 Subjective Chief Complaint Information obtained from Patient Sacral pressure ulcer and bilateral LE lymphedema History of Present Illness (HPI) 05/14/19 on evaluation today patient appears to be doing rather well in regard to her lower extremities compared to what it sounds like she was doing not that far back. Fortunately there's no signs of  active infection at this time although it does sound like she had significant cellulitis earlier in the year. She does also have a issue with a sacral ulcer upon inspection today which has been present since around February 2020 this is about as long as she's been dealing with her legs as well. She is under palliative care but not hospice. The  patient does have a history of scoliosis and kyphosis which makes it difficult for her to lay flat she actually sleep sitting up in a recliner but night and reclined at this cause her trouble breathing. She also has COPD, hypertension, and bilateral lower should be lymphedema and venous stasis. Currently there's no signs of active cellulitis of the bilateral lower extremities which is good news. The patient has not had any vascular intervention nor evaluation that we have on file. She does spend the majority per day apparently sitting in her recliner upright. This I think is an issue both for her legs as well as for the sacral region unfortunately. She could not tolerate the ABI's today due to the fact that it calls too much pain when we were performing the blood pressure reading on her lower extremities who were not even able to get a proper reading. No fevers, chills, nausea, or vomiting noted at this time. 05/25/19 on evaluation today patient appears to be doing about the same in regard to her sacral wound maybe slightly better compared to previous. Fortunately there's no signs of active infection at this time which is good news in regard to her lower extremities. She has previously had cellulitis therefore were trying to keep an eye on this as far as that's concerned. Nonetheless since I last saw her which was actually roughly 2 weeks ago since due to work her daughter was unable to bring her last week she is not had any care through home health and has not gotten compression stockings for her bilateral lower extremities. Subsequently the issue with home  health seems to be multifactorial when they contacted the patient and her daughter in order to get things scheduled they were told according to the daughter that she didn't know why they needed to come out three times a week and wasn't aware that that was what the order was gonna be. The daughter tells me that that somewhat poor timing for her as she works and doesn't have a lot of time to hang around at the house waiting for home health. With that being said I explained that the only thing we can really do is either have home health come out and perform the dressing changes or else potentially have a family member do this. There apparently is no family member including herself who is willing to do this she tells me "that she doesn't feel qualified and comfortable". Nonetheless either way the patient has not had a dressing on the sacral area since the last time she was here she uses a pad hasn't really noted much drainage onto the pad although it does appear that the wound itself trapped a lot of the fluid within skin folds. In the end I do think that since she doesn't really have anyone to perform the dressing changes at home we are going to need to utilize the services of home health. They are in agreement with that plan. Patient History Information obtained from Patient. Family History Cancer - Child, Diabetes - Siblings, Hypertension - Siblings, Stroke - Mother, No family history of Heart Disease, Kidney Disease, Lung Disease, Seizures, Thyroid Problems, Tuberculosis. Social History Never smoker, Marital Status - Widowed, Alcohol Use - Rarely, Caffeine Use - Daily. META, KROENKE ([id number]) Medical History Respiratory Patient has history of Chronic Obstructive Pulmonary Disease (COPD) Integumentary (Skin) Denies history of History of pressure wounds Musculoskeletal Patient has history of Osteoarthritis - hands, knees, sholders Oncologic Denies history of Received Chemotherapy,  Received  Radiation Review of Systems (ROS) Constitutional Symptoms (General Health) Denies complaints or symptoms of Fatigue, Fever, Chills, Marked Weight Change. Respiratory Denies complaints or symptoms of Chronic or frequent coughs, Shortness of Breath. Cardiovascular Denies complaints or symptoms of Chest pain, LE edema. Psychiatric Denies complaints or symptoms of Anxiety, Claustrophobia. Objective Constitutional Well-nourished and well-hydrated in no acute distress. Vitals Time Taken: 12:39 PM, Height: 60 in, Weight: 130 lbs, BMI: 25.4, Temperature: 98.2 F, Pulse: 89 bpm, Respiratory Rate: 16 breaths/min, Blood Pressure: 136/55 mmHg. Respiratory normal breathing without difficulty. clear to auscultation bilaterally. Cardiovascular regular rate and rhythm with normal S1, S2. Psychiatric this patient is able to make decisions and demonstrates good insight into disease process. Alert and Oriented x 3. pleasant and cooperative. General Notes: On inspection today patient's wound bed actually did appear to be doing okay there is still some Slough noted on the surface of the wound it is still open she does have some new granulation tissue as well as epithelial tissue but again she is still dealing with an open wound that I'm concerned about getting infected if we don't keep this covered with a good dressing. Something preferably that will keep it from becoming too hyper granular such as something that would keep it more dry. Integumentary (Hair, Skin) Wound #1 status is Open. Original cause of wound was Gradually Appeared. The wound is located on the Midline Sacrum. The wound measures 3cm length x 1cm width x 0.3cm depth; 2.356cm^2 area and 0.707cm^3 volume. There is Fat Layer (Subcutaneous Tissue) Exposed exposed. There is no tunneling noted, however, there is undermining starting at 12:00 and ending at 1:00 with a maximum distance of 0.3cm. There is a small amount of serous drainage  noted. The wound margin is Hildebrant, Novella L. ([id number]) epibole. There is large (67-100%) pink granulation within the wound bed. There is a small (1-33%) amount of necrotic tissue within the wound bed including Adherent Slough. Assessment Active Problems ICD-10 Pressure ulcer of sacral region, stage 3 Lymphedema, not elsewhere classified Venous insufficiency (chronic) (peripheral) Essential (primary) hypertension Chronic obstructive pulmonary disease, unspecified Plan Wound Cleansing: Wound #1 Midline Sacrum: Dial antibacterial soap, wash wounds, rinse and pat dry prior to dressing wounds May Shower, gently pat wound dry prior to applying new dressing. Primary Wound Dressing: Wound #1 Midline Sacrum: Silver Alginate Secondary Dressing: Wound #1 Midline Sacrum: Boardered Foam Dressing Dressing Change Frequency: Wound #1 Midline Sacrum: Change Dressing Monday, Wednesday, Friday Follow-up Appointments: Wound #1 Midline Sacrum: Return Appointment in 1 week. Edema Control: Elevate legs to the level of the heart and pump ankles as often as possible Other: - [place]: Wound #1 Midline Sacrum: [place] Nurse may visit PRN to address patient s wound care needs. FACE TO FACE ENCOUNTER: MEDICARE and MEDICAID PATIENTS: I certify that this patient is under my care and that I had a face-to-face encounter that meets the physician face-to-face encounter requirements with this patient on this date. The encounter with the patient was in whole or in part for the following MEDICAL CONDITION: (primary reason for [place]) MEDICAL NECESSITY: I certify, that based on my findings, NURSING services are a medically necessary home health service. HOME BOUND STATUS: I certify that my clinical findings support that this patient is homebound (i.e., Due to illness or injury, pt requires aid of supportive devices such as crutches, cane, wheelchairs,  walkers, the use of special transportation or the assistance of another person to leave their place of  residence. There is a normal inability to leave the home and doing so requires considerable and taxing effort. Other absences are for medical reasons / religious services and are infrequent or of short duration when for other reasons). If current dressing causes regression in wound condition, may D/C ordered dressing product/s and apply Normal Saline Moist Dressing daily until next [place] / Other MD appointment. [place] of regression in [place], [place] ([id number]) wound condition at [phone]. Please direct any NON-WOUND related issues/requests for orders to patient's Primary Care Physician Cephus Slater go ahead and reinitiate the orders and send this to home health for them to hopefully initiate therapy. If anything changes worsens meantime to contact the office let me know. Otherwise will see were things stand at follow-up. Please see above for specific wound care orders. We will see patient for re-evaluation in 1 week(s) here in the clinic. If anything worsens or changes patient will contact our office for additional recommendations. Electronic Signature(s) Signed: 05/30/2019 11:42:18 PM By: [name] PA-C Previous Signature: 05/25/2019 6:14:16 PM Version By: [name] PA-C Entered By: [name] on 05/30/2019 23:24:09 Dubow, Jamieka LMarland Kitchen ([id number]) -------------------------------------------------------------------------------- ROS/PFSH Details Patient Name: Kroening, Narjis L. Date of Service: 05/25/2019 12:30 PM Medical Record Number: [id number] Patient Account Number: [id number] Date of Birth/Sex: [birth year] (85 y.o. F) Treating RN: Montey Hora Primary Care Provider: [name], [name] Other Clinician: Referring Provider: [name] Treating Provider/Extender: Melburn Hake, HOYT Weeks in Treatment: 1 Information Obtained  From Patient Constitutional Symptoms (General Health) Complaints and Symptoms: Negative for: Fatigue; Fever; Chills; Marked Weight Change Respiratory Complaints and Symptoms: Negative for: Chronic or frequent coughs; Shortness of Breath Medical History: Positive for: Chronic Obstructive Pulmonary Disease (COPD) Cardiovascular Complaints and Symptoms: Negative for: Chest pain; LE edema Psychiatric Complaints and Symptoms: Negative for: Anxiety; Claustrophobia Integumentary (Skin) Medical History: Negative for: History of pressure wounds Musculoskeletal Medical History: Positive for: Osteoarthritis - hands, knees, sholders Oncologic Medical History: Negative for: Received Chemotherapy; Received Radiation Immunizations Pneumococcal Vaccine: Received Pneumococcal Vaccination: Yes Implantable Devices None Family and Social History MADYSIN, CRISP ([id number]) Cancer: Yes - Child; Diabetes: Yes - Siblings; Heart Disease: No; Hypertension: Yes - Siblings; Kidney Disease: No; Lung Disease: No; Seizures: No; Stroke: Yes - Mother; Thyroid Problems: No; Tuberculosis: No; Never smoker; Marital Status - Widowed; Alcohol Use: Rarely; Caffeine Use: Daily Physician Affirmation I have reviewed and agree with the above information. Electronic Signature(s) Signed: 05/25/2019 6:14:16 PM By: [name] PA-C Signed: 05/27/2019 4:10:49 PM By: Montey Hora Entered By: [name] on 05/25/2019 17:56:16 Cunningham, Misao L. ([id number]) -------------------------------------------------------------------------------- SuperBill Details Patient Name: Muniz, Kimberlie L. Date of Service: 05/25/2019 Medical Record Number: [id number] Patient Account Number: [id number] Date of Birth/Sex: [birth year] (85 y.o. F) Treating RN: Montey Hora Primary Care Provider: [name], [name] Other Clinician: Referring Provider: [name] Treating Provider/Extender: Melburn Hake, HOYT Weeks in Treatment:  1 Diagnosis Coding ICD-10 Codes Code Description L89.153 Pressure ulcer of sacral region, stage 3 I89.0 Lymphedema, not elsewhere classified I87.2 Venous insufficiency (chronic) (peripheral) I10 Essential (primary) hypertension J44.9 Chronic obstructive pulmonary disease, unspecified Facility Procedures CPT4 Code: [id number] Description: 99213 - WOUND CARE VISIT-LEV 3 EST PT Modifier: Quantity: 1 Physician Procedures CPT4 Code: 7408144 Description: 81856 - WC PHYS LEVEL 4 - EST PT ICD-10 Diagnosis Description L89.153 Pressure ulcer of sacral region, stage 3 I89.0 Lymphedema, not elsewhere classified I87.2 Venous insufficiency (chronic) (peripheral) I10 Essential (primary) hypertension Modifier: Quantity: 1 Electronic Signature(s)  Signed: 05/25/2019 6:14:16 PM By: [name] PA-C Entered By: [name] on 05/25/2019 17:57:19

## 2019-05-31 DIAGNOSIS — I890 Lymphedema, not elsewhere classified: Secondary | ICD-10-CM | POA: Diagnosis not present

## 2019-05-31 DIAGNOSIS — M170 Bilateral primary osteoarthritis of knee: Secondary | ICD-10-CM | POA: Diagnosis not present

## 2019-05-31 DIAGNOSIS — I872 Venous insufficiency (chronic) (peripheral): Secondary | ICD-10-CM | POA: Diagnosis not present

## 2019-05-31 DIAGNOSIS — I10 Essential (primary) hypertension: Secondary | ICD-10-CM | POA: Diagnosis not present

## 2019-05-31 DIAGNOSIS — L89153 Pressure ulcer of sacral region, stage 3: Secondary | ICD-10-CM | POA: Diagnosis not present

## 2019-05-31 DIAGNOSIS — J449 Chronic obstructive pulmonary disease, unspecified: Secondary | ICD-10-CM | POA: Diagnosis not present

## 2019-06-01 ENCOUNTER — Encounter: Payer: Medicare Other | Admitting: Physician Assistant

## 2019-06-01 ENCOUNTER — Other Ambulatory Visit: Payer: Self-pay

## 2019-06-01 DIAGNOSIS — L89153 Pressure ulcer of sacral region, stage 3: Secondary | ICD-10-CM | POA: Diagnosis not present

## 2019-06-01 DIAGNOSIS — I10 Essential (primary) hypertension: Secondary | ICD-10-CM | POA: Diagnosis not present

## 2019-06-01 DIAGNOSIS — L89159 Pressure ulcer of sacral region, unspecified stage: Secondary | ICD-10-CM | POA: Diagnosis not present

## 2019-06-01 DIAGNOSIS — J449 Chronic obstructive pulmonary disease, unspecified: Secondary | ICD-10-CM | POA: Diagnosis not present

## 2019-06-01 DIAGNOSIS — I872 Venous insufficiency (chronic) (peripheral): Secondary | ICD-10-CM | POA: Diagnosis not present

## 2019-06-01 DIAGNOSIS — I890 Lymphedema, not elsewhere classified: Secondary | ICD-10-CM | POA: Diagnosis not present

## 2019-06-01 NOTE — Progress Notes (Addendum)
Joyce [name] ([id number]) Visit Report for 06/01/2019 Chief Complaint Document Details Patient Name: [name], [name] Date of Service: 06/01/2019 1:00 PM Medical Record Number: [id number] Patient Account Number: [id number] Date of Birth/Sex: 22-Jan-1928 (85 y.o. F) Treating RN: Montey Hora Primary Care Provider: [name], [name] Other Clinician: Referring Provider: [name] Treating Provider/Extender: Melburn Hake, HOYT Weeks in Treatment: 2 Information Obtained from: Patient Chief Complaint Sacral pressure ulcer and bilateral LE lymphedema Electronic Signature(s) Signed: 06/01/2019 1:01:50 PM By: [name] PA-C Entered By: [name] on 06/01/2019 13:01:50 Ferrick, Vedha L. ([id number]) -------------------------------------------------------------------------------- HPI Details Patient Name: [name], [name] Date of Service: 06/01/2019 1:00 PM Medical Record Number: [id number] Patient Account Number: [id number] Date of Birth/Sex: [birth year] (85 y.o. F) Treating RN: Montey Hora Primary Care Provider: [name], [name] Other Clinician: Referring Provider: [name] Treating Provider/Extender: Sharalyn Ink in Treatment: 2 History of Present Illness HPI Description: 05/14/19 on evaluation today patient appears to be doing rather well in regard to her lower extremities compared to what it sounds like she was doing not that far back. Fortunately there's no signs of active infection at this time although it does sound like she had significant cellulitis earlier in the year. She does also have a issue with a sacral ulcer upon inspection today which has been present since around February 2020 this is about as long as she's been dealing with her legs as well. She is under palliative care but not hospice. The patient does have a history of scoliosis and kyphosis which makes it difficult for her to lay flat she actually sleep sitting up in a recliner  but night and reclined at this cause her trouble breathing. She also has COPD, hypertension, and bilateral lower should be lymphedema and venous stasis. Currently there's no signs of active cellulitis of the bilateral lower extremities which is good news. The patient has not had any vascular intervention nor evaluation that we have on file. She does spend the majority per day apparently sitting in her recliner upright. This I think is an issue both for her legs as well as for the sacral region unfortunately. She could not tolerate the ABI's today due to the fact that it calls too much pain when we were performing the blood pressure reading on her lower extremities who were not even able to get a proper reading. No fevers, chills, nausea, or vomiting noted at this time. 05/25/19 on evaluation today patient appears to be doing about the same in regard to her sacral wound maybe slightly better compared to previous. Fortunately there's no signs of active infection at this time which is good news in regard to her lower extremities. She has previously had cellulitis therefore were trying to keep an eye on this as far as that's concerned. Nonetheless since I last saw her which was actually roughly 2 weeks ago since due to work her daughter was unable to bring her last week she is not had any care through home health and has not gotten compression stockings for her bilateral lower extremities. Subsequently the issue with home health seems to be multifactorial when they contacted the patient and her daughter in order to get things scheduled they were told according to the daughter that she didn't know why they needed to come out three times a week and wasn't aware that that was what the order was gonna be. The daughter tells me that that somewhat poor timing for her as she works  and doesn't have a lot of time to hang around at the house waiting for home health. With that being said I explained that the only  thing we can really do is either have home health come out and perform the dressing changes or else potentially have a family member do this. There apparently is no family member including herself who is willing to do this she tells me "that she doesn't feel qualified and comfortable". Nonetheless either way the patient has not had a dressing on the sacral area since the last time she was here she uses a pad hasn't really noted much drainage onto the pad although it does appear that the wound itself trapped a lot of the fluid within skin folds. In the end I do think that since she doesn't really have anyone to perform the dressing changes at home we are going to need to utilize the services of home health. They are in agreement with that plan. 06/01/19 on evaluation today patient presents for follow-up concerning her wound in the midline sacral area. This seems to be doing well there is some evidence of new skin growth there still an open sore however it is gonna require dressing. Home health is coming out to see her I think we do need to see about getting her a better schedule where potentially she comes entire clinic on Monday and then on Wednesday and Friday his home health change in her dressing as it stands right now I feel like this is happening to a regular for her. Fortunately there's no signs of any infection at this time her legs are doing well although she does have the compression stockings finally she doesn't actually have those on as of today. They want [place] to show them how to put them on. Electronic Signature(s) Signed: 06/02/2019 9:47:59 AM By: [name] PA-C Entered By: [name] on 06/02/2019 02:39:14 Hellberg, Irven [place] ([id number]) -------------------------------------------------------------------------------- Physical Exam Details Patient Name: [name], [name] Date of Service: 06/01/2019 1:00 PM Medical Record Number: [id number] Patient Account Number: [id number] Date  of Birth/Sex: 13-Feb-1928 (85 y.o. F) Treating RN: Montey Hora Primary Care Provider: [name], [name] Other Clinician: Referring Provider: [name], RICHARD Treating Provider/Extender: Melburn Hake, HOYT Weeks in Treatment: 2 Constitutional Well-nourished and well-hydrated in no acute distress. Respiratory normal breathing without difficulty. clear to auscultation bilaterally. Cardiovascular regular rate and rhythm with normal S1, S2. Psychiatric this patient is able to make decisions and demonstrates good insight into disease process. Alert and Oriented x 3. pleasant and cooperative. Notes On evaluation today patient actually appears to be doing well with regard to her sacral wound. I do believe that she would benefit from continued have a dressing on regularly unfortunately this was put on last on Friday and did not make it through the weekend it had to be changed again on Monday and she states that it fell off around Saturday. I feel like we get on a better schedule this would do much better for her. No sharp debridement was required today in regard to the patient's wound. I did mechanically clean this with saline and gauze. Electronic Signature(s) Signed: 06/02/2019 9:47:59 AM By: [name] PA-C Entered By: [name] on 06/02/2019 02:40:29 Osberg, Irven [place] ([id number]) -------------------------------------------------------------------------------- Physician Orders Details Patient Name: Batalla, Luccia L. Date of Service: 06/01/2019 1:00 PM Medical Record Number: [id number] Patient Account Number: [id number] Date of Birth/Sex: 07-Apr-1928 (85 y.o. F) Treating RN: Montey Hora Primary Care Provider: [name],  RICHARD Other Clinician: Referring Provider: [name], RICHARD Treating Provider/Extender: Melburn Hake, HOYT Weeks in Treatment: 2 Verbal / Phone Orders: No Diagnosis Coding ICD-10 Coding Code Description L89.153 Pressure ulcer of sacral region, stage  3 I89.0 Lymphedema, not elsewhere classified I87.2 Venous insufficiency (chronic) (peripheral) I10 Essential (primary) hypertension J44.9 Chronic obstructive pulmonary disease, unspecified Wound Cleansing Wound #1 Midline Sacrum o Dial antibacterial soap, wash wounds, rinse and pat dry prior to dressing wounds o May Shower, gently pat wound dry prior to applying new dressing. Primary Wound Dressing Wound #1 Midline Sacrum o Silver Alginate Secondary Dressing Wound #1 Midline Sacrum o Boardered Foam Dressing Dressing Change Frequency Wound #1 Midline Sacrum o Change Dressing Monday, Wednesday, Friday Follow-up Appointments Wound #1 Midline Sacrum o Return Appointment in 1 week. - Monday 06/07/19 Edema Control o Patient to wear own compression stockings o Elevate legs to the level of the heart and pump ankles as often as possible Home Health Wound #1 Midline [place] Nurse may visit PRN to address patientos wound care needs. o FACE TO FACE ENCOUNTER: MEDICARE and MEDICAID PATIENTS: I certify that this patient is under my care and that I had a face-to-face encounter that meets the physician face-to-face encounter requirements with this patient on this date. The encounter with the patient was in whole or in part for the following MEDICAL CONDITION: (primary reason for [place]) MEDICAL NECESSITY: I certify, that based on my findings, VIRGILENE, STRYKER ([id number]) NURSING services are a medically necessary home health service. HOME BOUND STATUS: I certify that my clinical findings support that this patient is homebound (i.e., Due to illness or injury, pt requires aid of supportive devices such as crutches, cane, wheelchairs, walkers, the use of special transportation or the assistance of another person to leave their place of residence. There is a normal inability to leave the home and doing so requires considerable  and taxing effort. Other absences are for medical reasons / religious services and are infrequent or of short duration when for other reasons). o If current dressing causes regression in wound condition, may D/C ordered dressing product/s and apply Normal Saline Moist Dressing daily until next [place] / Other MD appointment. [place] of regression in wound condition at [phone]. o Please direct any NON-WOUND related issues/requests for orders to patient's Primary Care Physician Electronic Signature(s) Signed: 06/01/2019 4:49:07 PM By: Montey Hora Signed: 06/02/2019 9:47:59 AM By: [name] PA-C Entered By: Montey Hora on 06/01/2019 13:45:27 Mccard, Laramie L. ([id number]) -------------------------------------------------------------------------------- Problem List Details Patient Name: Bowland, [name] Date of Service: 06/01/2019 1:00 PM Medical Record Number: [id number] Patient Account Number: [id number] Date of Birth/Sex: [birth year] (85 y.o. F) Treating RN: Montey Hora Primary Care Provider: [name], [name] Other Clinician: Referring Provider: [name], [name] Treating Provider/Extender: Sharalyn Ink in Treatment: 2 Active Problems ICD-10 Evaluated Encounter Code Description Active Date Today Diagnosis L89.153 Pressure ulcer of sacral region, stage 3 05/14/2019 No Yes I89.0 Lymphedema, not elsewhere classified 05/14/2019 No Yes I87.2 Venous insufficiency (chronic) (peripheral) 05/14/2019 No Yes I10 Essential (primary) hypertension 05/14/2019 No Yes J44.9 Chronic obstructive pulmonary disease, unspecified 05/14/2019 No Yes Inactive Problems Resolved Problems Electronic Signature(s) Signed: 06/01/2019 1:01:41 PM By: [name] PA-C Entered By: [name] on 06/01/2019 13:01:41 Demauro, [place]. ([id number]) -------------------------------------------------------------------------------- Progress Note  Details Patient Name: Ahles, [name] Date of Service: 06/01/2019 1:00 PM Medical Record Number: [id number] Patient Account Number: [id number]  Date of Birth/Sex: 08/24/1928 (85 y.o. F) Treating RN: Montey Hora Primary Care Provider: [name], [name] Other Clinician: Referring Provider: [name] Treating Provider/Extender: Sharalyn Ink in Treatment: 2 Subjective Chief Complaint Information obtained from Patient Sacral pressure ulcer and bilateral LE lymphedema History of Present Illness (HPI) 05/14/19 on evaluation today patient appears to be doing rather well in regard to her lower extremities compared to what it sounds like she was doing not that far back. Fortunately there's no signs of active infection at this time although it does sound like she had significant cellulitis earlier in the year. She does also have a issue with a sacral ulcer upon inspection today which has been present since around February 2020 this is about as long as she's been dealing with her legs as well. She is under palliative care but not hospice. The patient does have a history of scoliosis and kyphosis which makes it difficult for her to lay flat she actually sleep sitting up in a recliner but night and reclined at this cause her trouble breathing. She also has COPD, hypertension, and bilateral lower should be lymphedema and venous stasis. Currently there's no signs of active cellulitis of the bilateral lower extremities which is good news. The patient has not had any vascular intervention nor evaluation that we have on file. She does spend the majority per day apparently sitting in her recliner upright. This I think is an issue both for her legs as well as for the sacral region unfortunately. She could not tolerate the ABI's today due to the fact that it calls too much pain when we were performing the blood pressure reading on her lower extremities who were not even able to get a proper  reading. No fevers, chills, nausea, or vomiting noted at this time. 05/25/19 on evaluation today patient appears to be doing about the same in regard to her sacral wound maybe slightly better compared to previous. Fortunately there's no signs of active infection at this time which is good news in regard to her lower extremities. She has previously had cellulitis therefore were trying to keep an eye on this as far as that's concerned. Nonetheless since I last saw her which was actually roughly 2 weeks ago since due to work her daughter was unable to bring her last week she is not had any care through home health and has not gotten compression stockings for her bilateral lower extremities. Subsequently the issue with home health seems to be multifactorial when they contacted the patient and her daughter in order to get things scheduled they were told according to the daughter that she didn't know why they needed to come out three times a week and wasn't aware that that was what the order was gonna be. The daughter tells me that that somewhat poor timing for her as she works and doesn't have a lot of time to hang around at the house waiting for home health. With that being said I explained that the only thing we can really do is either have home health come out and perform the dressing changes or else potentially have a family member do this. There apparently is no family member including herself who is willing to do this she tells me "that she doesn't feel qualified and comfortable". Nonetheless either way the patient has not had a dressing on the sacral area since the last time she was here she uses a pad hasn't really noted much drainage onto the pad  although it does appear that the wound itself trapped a lot of the fluid within skin folds. In the end I do think that since she doesn't really have anyone to perform the dressing changes at home we are going to need to utilize the services of home  health. They are in agreement with that plan. 06/01/19 on evaluation today patient presents for follow-up concerning her wound in the midline sacral area. This seems to be doing well there is some evidence of new skin growth there still an open sore however it is gonna require dressing. Home health is coming out to see her I think we do need to see about getting her a better schedule where potentially she comes entire clinic on Monday and then on Wednesday and Friday his home health change in her dressing as it stands right now I feel like this is happening to a regular for her. Fortunately there's no signs of any infection at this time her legs are doing well although she does have the compression stockings finally she doesn't actually have those on as of today. They want [place] to show them how to put them on. Patient History Information obtained from Patient. MARISOL, GIAMBRA ([id number]) Family History Cancer - Child, Diabetes - Siblings, Hypertension - Siblings, Stroke - Mother, No family history of Heart Disease, Kidney Disease, Lung Disease, Seizures, Thyroid Problems, Tuberculosis. Social History Never smoker, Marital Status - Widowed, Alcohol Use - Rarely, Caffeine Use - Daily. Medical History Respiratory Patient has history of Chronic Obstructive Pulmonary Disease (COPD) Integumentary (Skin) Denies history of History of pressure wounds Musculoskeletal Patient has history of Osteoarthritis - hands, knees, sholders Oncologic Denies history of Received Chemotherapy, Received Radiation Review of Systems (ROS) Constitutional Symptoms (General Health) Denies complaints or symptoms of Fatigue, Fever, Chills, Marked Weight Change. Respiratory Denies complaints or symptoms of Chronic or frequent coughs, Shortness of Breath. Cardiovascular Denies complaints or symptoms of Chest pain, LE edema. Psychiatric Denies complaints or symptoms of Anxiety,  Claustrophobia. Objective Constitutional Well-nourished and well-hydrated in no acute distress. Vitals Time Taken: 1:05 PM, Height: 60 in, Weight: 130 lbs, BMI: 25.4, Temperature: 98.9 F, Pulse: 97 bpm, Respiratory Rate: 16 breaths/min, Blood Pressure: 130/55 mmHg. Respiratory normal breathing without difficulty. clear to auscultation bilaterally. Cardiovascular regular rate and rhythm with normal S1, S2. Psychiatric this patient is able to make decisions and demonstrates good insight into disease process. Alert and Oriented x 3. pleasant and cooperative. General Notes: On evaluation today patient actually appears to be doing well with regard to her sacral wound. I do believe that she would benefit from continued have a dressing on regularly unfortunately this was put on last on Friday and did not make it through the weekend it had to be changed again on Monday and she states that it fell off around Saturday. I feel like Leidy, Sunya L. ([id number]) we get on a better schedule this would do much better for her. No sharp debridement was required today in regard to the patient's wound. I did mechanically clean this with saline and gauze. Integumentary (Hair, Skin) Wound #1 status is Open. Original cause of wound was Gradually Appeared. The wound is located on the Midline Sacrum. The wound measures 3cm length x 1cm width x 0.8cm depth; 2.356cm^2 area and 1.885cm^3 volume. There is Fat Layer (Subcutaneous Tissue) Exposed exposed. There is no tunneling or undermining noted. There is a small amount of serous drainage noted. The wound margin is epibole. There is large (67-100%) pink granulation  within the wound bed. There is a small (1-33%) amount of necrotic tissue within the wound bed including Adherent Slough. Assessment Active Problems ICD-10 Pressure ulcer of sacral region, stage 3 Lymphedema, not elsewhere classified Venous insufficiency (chronic) (peripheral) Essential (primary)  hypertension Chronic obstructive pulmonary disease, unspecified Plan Wound Cleansing: Wound #1 Midline Sacrum: Dial antibacterial soap, wash wounds, rinse and pat dry prior to dressing wounds May Shower, gently pat wound dry prior to applying new dressing. Primary Wound Dressing: Wound #1 Midline Sacrum: Silver Alginate Secondary Dressing: Wound #1 Midline Sacrum: Boardered Foam Dressing Dressing Change Frequency: Wound #1 Midline Sacrum: Change Dressing Monday, Wednesday, Friday Follow-up Appointments: Wound #1 Midline Sacrum: Return Appointment in 1 week. - Monday 06/07/19 Edema Control: Patient to wear own compression stockings Elevate legs to the level of the heart and pump ankles as often as possible Home Health: Wound #1 Midline Sacrum: [place] Nurse may visit PRN to address patient s wound care needs. FACE TO FACE ENCOUNTER: MEDICARE and MEDICAID PATIENTS: I certify that this patient is under my care and that I had a face-to-face encounter that meets the physician face-to-face encounter requirements with this patient on this date. The encounter with the patient was in whole or in part for the following MEDICAL CONDITION: (primary reason for [place]) MEDICAL NECESSITY: I certify, that based on my findings, NURSING services are a medically necessary home Mcghee, Lakeyia L. ([id number]) health service. HOME BOUND STATUS: I certify that my clinical findings support that this patient is homebound (i.e., Due to illness or injury, pt requires aid of supportive devices such as crutches, cane, wheelchairs, walkers, the use of special transportation or the assistance of another person to leave their place of residence. There is a normal inability to leave the home and doing so requires considerable and taxing effort. Other absences are for medical reasons / religious services and are infrequent or of short duration when for other reasons). If  current dressing causes regression in wound condition, may D/C ordered dressing product/s and apply Normal Saline Moist Dressing daily until next [place] / Other MD appointment. [place] of regression in wound condition at [phone]. Please direct any NON-WOUND related issues/requests for orders to patient's Primary Care Physician My suggestion at this point is gonna be that she where the compression stockings that they did get I think this is absolutely appropriate. Subsequently I think that she should really be wearing these during the day at all times. With regard to her sacral wound she needs to try to keep as much pressure off this is possible and I do think having home health perform the dressing changes on Wednesday and Friday and that if we go to it every two week schedule of seeing her the Mondays that we don't see her as well. The patient and her daughter are in agreement with this plan. Please see above for specific wound care orders. We will see patient for re-evaluation in 1 week(s) here in the clinic. If anything worsens or changes patient will contact our office for additional recommendations. Electronic Signature(s) Signed: 06/02/2019 9:47:59 AM By: [name] PA-C Entered By: [name] on 06/02/2019 02:41:25 [place], Koya LMarland Kitchen ([id number]) -------------------------------------------------------------------------------- ROS/PFSH Details Patient Name: Haberkorn, [name] Date of Service: 06/01/2019 1:00 PM Medical Record Number: [id number] Patient Account Number: [id number] Date of Birth/Sex: [birth year] (85 y.o. F) Treating RN: Montey Hora Primary Care Provider: [name], [name] Other Clinician: Referring Provider: [name],  RICHARD Treating Provider/Extender: STONE III, HOYT Weeks in Treatment: 2 Information Obtained From Patient Constitutional Symptoms (General Health) Complaints and Symptoms: Negative for: Fatigue; Fever;  Chills; Marked Weight Change Respiratory Complaints and Symptoms: Negative for: Chronic or frequent coughs; Shortness of Breath Medical History: Positive for: Chronic Obstructive Pulmonary Disease (COPD) Cardiovascular Complaints and Symptoms: Negative for: Chest pain; LE edema Psychiatric Complaints and Symptoms: Negative for: Anxiety; Claustrophobia Integumentary (Skin) Medical History: Negative for: History of pressure wounds Musculoskeletal Medical History: Positive for: Osteoarthritis - hands, knees, sholders Oncologic Medical History: Negative for: Received Chemotherapy; Received Radiation Immunizations Pneumococcal Vaccine: Received Pneumococcal Vaccination: Yes Implantable Devices None Family and Social History Joyce [name], Joyce [name] ([id number]) Cancer: Yes - Child; Diabetes: Yes - Siblings; Heart Disease: No; Hypertension: Yes - Siblings; Kidney Disease: No; Lung Disease: No; Seizures: No; Stroke: Yes - Mother; Thyroid Problems: No; Tuberculosis: No; Never smoker; Marital Status - Widowed; Alcohol Use: Rarely; Caffeine Use: Daily Physician Affirmation I have reviewed and agree with the above information. Electronic Signature(s) Signed: 06/02/2019 9:47:59 AM By: [name] PA-C Signed: 06/02/2019 4:41:53 PM By: Montey Hora Entered By: [name] on 06/02/2019 02:39:40 [place], Guliana L. ([id number]) -------------------------------------------------------------------------------- SuperBill Details Patient Name: Dizon, [name] Date of Service: 06/01/2019 Medical Record Number: [id number] Patient Account Number: [id number] Date of Birth/Sex: 12-Jan-1928 (85 y.o. F) Treating RN: Montey Hora Primary Care Provider: [name], [name] Other Clinician: Referring Provider: [name] Treating Provider/Extender: Melburn Hake, HOYT Weeks in Treatment: 2 Diagnosis Coding ICD-10 Codes Code Description L89.153 Pressure ulcer of sacral region, stage 3 I89.0  Lymphedema, not elsewhere classified I87.2 Venous insufficiency (chronic) (peripheral) I10 Essential (primary) hypertension J44.9 Chronic obstructive pulmonary disease, unspecified Facility Procedures CPT4 Code: 25003704 Description: 99213 - WOUND CARE VISIT-LEV 3 EST PT Modifier: Quantity: 1 Physician Procedures CPT4 Code: 8889169 Description: 45038 - WC PHYS LEVEL 4 - EST PT ICD-10 Diagnosis Description L89.153 Pressure ulcer of sacral region, stage 3 I89.0 Lymphedema, not elsewhere classified I87.2 Venous insufficiency (chronic) (peripheral) I10 Essential (primary) hypertension Modifier: Quantity: 1 Electronic Signature(s) Signed: 06/02/2019 9:47:59 AM By: [name] PA-C Entered By: [name] on 06/02/2019 02:41:44

## 2019-06-01 NOTE — Progress Notes (Addendum)
Joyce, [name] ([id number]) Visit Report for 06/01/2019 Arrival Information Details Patient Name: Joyce [name], Joyce [name]. Date of Service: 06/01/2019 1:00 PM Medical Record Number: [id number] Patient Account Number: [id number] Date of Birth/Sex: 03-24-1928 (85 y.o. F) Treating RN: Montey Hora Primary Care [name]: [name], [name] Other Clinician: Referring [name]: [name] Treating Miki Blank/Extender: Melburn Hake, HOYT Weeks in Treatment: 2 Visit Information History Since Last Visit Added or deleted any medications: No Patient Arrived: [name] Any new allergies or adverse reactions: No Arrival Time: 13:02 Had a fall or experienced change in No Accompanied By: daughter activities of daily living that may affect Transfer Assistance: None risk of falls: Patient Identification Verified: Yes Signs or symptoms of abuse/neglect since last visito No Secondary Verification Process Completed: Yes Hospitalized since last visit: No Patient Requires Transmission-Based No Implantable device outside of the clinic excluding No Precautions: cellular tissue based products placed in the center Patient Has Alerts: Yes since last visit: Patient Alerts: Not Has Dressing in Place as Prescribed: Yes Diabetic Pain Present Now: No Electronic Signature(s) Signed: 06/01/2019 4:24:52 PM By: [name] RCP, RRT, CHT Entered By: [name] on 06/01/2019 13:05:00 [name], Joyce [name]. ([id number]) -------------------------------------------------------------------------------- Clinic Level of Care Assessment Details Patient Name: Joyce [name], Joyce [name]. Date of Service: 06/01/2019 1:00 PM Medical Record Number: [id number] Patient Account Number: [id number] Date of Birth/Sex: [birth year] (85 y.o. F) Treating RN: Montey Hora Primary Care Taytem Ghattas: [name], [name] Other Clinician: Referring Greenleigh [name]: [name] Treating Keayra Graham/Extender: Melburn Hake, HOYT Weeks  in Treatment: 2 Clinic Level of Care Assessment Items TOOL 4 Quantity Score []  - Use when only an EandM is performed on FOLLOW-UP visit 0 ASSESSMENTS - Nursing Assessment / Reassessment X - Reassessment of Co-morbidities (includes updates in patient status) 1 10 X- 1 5 Reassessment of Adherence to Treatment Plan ASSESSMENTS - Wound and Skin Assessment / Reassessment X - Simple Wound Assessment / Reassessment - one wound 1 5 []  - 0 Complex Wound Assessment / Reassessment - multiple wounds []  - 0 Dermatologic / Skin Assessment (not related to wound area) ASSESSMENTS - Focused Assessment []  - Circumferential Edema Measurements - multi extremities 0 []  - 0 Nutritional Assessment / Counseling / Intervention []  - 0 Lower Extremity Assessment (monofilament, tuning fork, pulses) []  - 0 Peripheral Arterial Disease Assessment (using hand held doppler) ASSESSMENTS - Ostomy and/or Continence Assessment and Care []  - Incontinence Assessment and Management 0 []  - 0 Ostomy Care Assessment and Management (repouching, etc.) PROCESS - Coordination of Care X - Simple Patient / Family Education for ongoing care 1 15 []  - 0 Complex (extensive) Patient / Family Education for ongoing care X- 1 10 Staff obtains Programmer, systems, Records, Test Results / Process Orders []  - 0 Staff telephones HHA, Nursing Homes / Clarify orders / etc []  - 0 Routine Transfer to another Facility (non-emergent condition) []  - 0 Routine Hospital Admission (non-emergent condition) []  - 0 New Admissions / Biomedical engineer / Ordering NPWT, Apligraf, etc. []  - 0 Emergency Hospital Admission (emergent condition) X- 1 10 Simple Discharge Coordination Joyce [name], Joyce [name]. ([id number]) []  - 0 Complex (extensive) Discharge Coordination PROCESS - Special Needs []  - Pediatric / Minor Patient Management 0 []  - 0 Isolation Patient Management []  - 0 Hearing / Language / Visual special needs []  - 0 Assessment of Community  assistance (transportation, D/C planning, etc.) []  - 0 Additional assistance / Altered mentation []  - 0 Support Surface(s) Assessment (bed, cushion, seat, etc.) INTERVENTIONS - Wound Cleansing / Measurement X -  Simple Wound Cleansing - one wound 1 5 []  - 0 Complex Wound Cleansing - multiple wounds X- 1 5 Wound Imaging (photographs - any number of wounds) []  - 0 Wound Tracing (instead of photographs) X- 1 5 Simple Wound Measurement - one wound []  - 0 Complex Wound Measurement - multiple wounds INTERVENTIONS - Wound Dressings X - Small Wound Dressing one or multiple wounds 1 10 []  - 0 Medium Wound Dressing one or multiple wounds []  - 0 Large Wound Dressing one or multiple wounds []  - 0 Application of Medications - topical []  - 0 Application of Medications - injection INTERVENTIONS - Miscellaneous []  - External ear exam 0 []  - 0 Specimen Collection (cultures, biopsies, blood, body fluids, etc.) []  - 0 Specimen(s) / Culture(s) sent or taken to Lab for analysis []  - 0 Patient Transfer (multiple staff / Civil Service fast streamer / Similar devices) []  - 0 Simple Staple / Suture removal (25 or less) []  - 0 Complex Staple / Suture removal (26 or more) []  - 0 Hypo / Hyperglycemic Management (close monitor of Blood Glucose) []  - 0 Ankle / Brachial Index (ABI) - do not check if billed separately X- 1 5 Vital Signs Joyce [name], Joyce [name]. ([id number]) Has the patient been seen at the hospital within the last three years: Yes Total Score: 85 Level Of Care: New/Established - Level 3 Electronic Signature(s) Signed: 06/01/2019 4:49:07 PM By: Montey Hora Entered By: Montey Hora on 06/01/2019 13:45:53 Joyce [name], Joyce [name]. ([id number]) -------------------------------------------------------------------------------- Encounter Discharge Information Details Patient Name: Joyce [name], Joyce [name]. Date of Service: 06/01/2019 1:00 PM Medical Record Number: [id number] Patient Account Number: [id number] Date of  Birth/Sex: April 24, 1928 (85 y.o. F) Treating RN: Montey Hora Primary Care Derin Matthes: [name], [name] Other Clinician: Referring Heitor Steinhoff: [name] Treating Presly Steinruck/Extender: Sharalyn Ink in Treatment: 2 Encounter Discharge Information Items Discharge Condition: Stable Ambulatory Status: [name] Discharge Destination: Home Transportation: Private Auto Accompanied By: daughter Schedule Follow-up Appointment: Yes Clinical Summary of Care: Electronic Signature(s) Signed: 06/01/2019 4:49:07 PM By: Montey Hora Entered By: Montey Hora on 06/01/2019 13:47:05 Diosdado, Alea [name]. ([id number]) -------------------------------------------------------------------------------- Lower Extremity Assessment Details Patient Name: Joyce [name], Joyce [name]. Date of Service: 06/01/2019 1:00 PM Medical Record Number: [id number] Patient Account Number: [id number] Date of Birth/Sex: 1928/05/07 (85 y.o. F) Treating RN: Army Melia Primary Care Meighan Treto: [name] Other Clinician: Referring Shemar Plemmons: [name], RICHARD Treating Sheril Hammond/Extender: Melburn Hake, HOYT Weeks in Treatment: 2 Electronic Signature(s) Signed: 06/01/2019 3:25:15 PM By: Army Melia Entered By: Army Melia on 06/01/2019 13:09:27 Camero, Jeannette [name]. ([id number]) -------------------------------------------------------------------------------- Multi Wound Chart Details Patient Name: Joyce [name], Joyce [name]. Date of Service: 06/01/2019 1:00 PM Medical Record Number: [id number] Patient Account Number: [id number] Date of Birth/Sex: 03-04-1928 (85 y.o. F) Treating RN: Montey Hora Primary Care Zellie Jenning: [name], [name] Other Clinician: Referring Tajae Rybicki: [name], [name] Treating Yohannes Waibel/Extender: Melburn Hake, HOYT Weeks in Treatment: 2 Vital Signs Height(in): 60 Pulse(bpm): 97 Weight(lbs): 130 Blood Pressure(mmHg): 130/55 Body Mass Index(BMI): 25 Temperature(F): 98.9 Respiratory  Rate 16 (breaths/min): Photos: [N/A:N/A] Wound Location: Sacrum - Midline N/A N/A Wounding Event: Gradually Appeared N/A N/A Primary Etiology: Pressure Ulcer N/A N/A Comorbid History: Chronic Obstructive N/A N/A Pulmonary Disease (COPD), Osteoarthritis Date Acquired: 12/28/2018 N/A N/A Weeks of Treatment: 2 N/A N/A Wound Status: Open N/A N/A Measurements [name] x W x D 3x1x0.8 N/A N/A (cm) Area (cm) : 2.356 N/A N/A Volume (cm) : 1.885 N/A N/A % Reduction in Area: -94.70% N/A N/A % Reduction in Volume: -678.90% N/A N/A Classification: Category/Stage III  N/A N/A Exudate Amount: Small N/A N/A Exudate Type: Serous N/A N/A Exudate Color: amber N/A N/A Wound Margin: Epibole N/A N/A Granulation Amount: Large (67-100%) N/A N/A Granulation Quality: Pink N/A N/A Necrotic Amount: Small (1-33%) N/A N/A Exposed Structures: Fat Layer (Subcutaneous N/A N/A Tissue) Exposed: Yes Fascia: No Tendon: No Muscle: No Wilber, Ikhlas [name]. ([id number]) Joint: No Bone: No Epithelialization: None N/A N/A Treatment Notes Electronic Signature(s) Signed: 06/01/2019 4:49:07 PM By: Montey Hora Entered By: Montey Hora on 06/01/2019 13:42:01 Joyce [name], [place]. ([id number]) -------------------------------------------------------------------------------- [place] Details Patient Name: Joyce [name], Joyce [name]. Date of Service: 06/01/2019 1:00 PM Medical Record Number: [id number] Patient Account Number: [id number] Date of Birth/Sex: [birth year] (85 y.o. F) Treating RN: Montey Hora Primary Care Delight Bickle: [name], [name] Other Clinician: Referring Nonie Lochner: [name] Treating Haylea Schlichting/Extender: Melburn Hake, HOYT Weeks in Treatment: 2 Active Inactive Abuse / Safety / Falls / Self Care Management Nursing Diagnoses: Potential for falls Goals: Patient will not experience any injury related to falls Date Initiated: 05/14/2019 Target Resolution Date: 08/14/2019 Goal Status:  Active Interventions: Assess fall risk on admission and as needed Notes: Orientation to the Wound Care Program Nursing Diagnoses: Knowledge deficit related to the wound healing center program Goals: Patient/caregiver will verbalize understanding of the [facility] Program Date Initiated: 05/14/2019 Target Resolution Date: 08/14/2019 Goal Status: Active Interventions: Provide education on orientation to the wound center Notes: Pressure Nursing Diagnoses: Potential for impaired tissue integrity related to pressure, friction, moisture, and shear Goals: Patient will remain free from development of additional pressure ulcers Date Initiated: 05/14/2019 Target Resolution Date: 08/14/2019 Goal Status: Active Interventions: Provide education on pressure ulcers Joyce [name], Joyce [name]. ([id number]) Notes: Wound/Skin Impairment Nursing Diagnoses: Impaired tissue integrity Goals: Ulcer/skin breakdown will heal within 14 weeks Date Initiated: 05/14/2019 Target Resolution Date: 08/14/2019 Goal Status: Active Interventions: Assess patient/caregiver ability to obtain necessary supplies Assess patient/caregiver ability to perform ulcer/skin care regimen upon admission and as needed Assess ulceration(s) every visit Notes: Electronic Signature(s) Signed: 06/01/2019 4:49:07 PM By: Montey Hora Entered By: Montey Hora on 06/01/2019 13:41:52 [place], [place]. ([id number]) -------------------------------------------------------------------------------- Pain Assessment Details Patient Name: Joyce [name], Joyce [name]. Date of Service: 06/01/2019 1:00 PM Medical Record Number: [id number] Patient Account Number: [id number] Date of Birth/Sex: 09-27-1928 (85 y.o. F) Treating RN: Montey Hora Primary Care Esaul Dorwart: [name], [name] Other Clinician: Referring Federick Levene: [name] Treating Chellsie Gomer/Extender: Melburn Hake, HOYT Weeks in Treatment: 2 Active Problems Location of Pain Severity and  Description of Pain Patient Has Paino No Site Locations Pain Management and Medication Current Pain Management: Electronic Signature(s) Signed: 06/01/2019 4:24:52 PM By: Paulla Fore, RRT, CHT Signed: 06/01/2019 4:49:07 PM By: Montey Hora Entered By: [name] on 06/01/2019 13:05:19 Sobolewski, Boomer. ([id number]) -------------------------------------------------------------------------------- Patient/Caregiver Education Details Patient Name: Joyce [name], Joyce [name]. Date of Service: 06/01/2019 1:00 PM Medical Record Number: [id number] Patient Account Number: [id number] Date of Birth/Gender: January 17, 1928 (85 y.o. F) Treating RN: Montey Hora Primary Care Physician: [name], [name] Other Clinician: Referring Physician: [name] Treating Physician/Extender: Sharalyn Ink in Treatment: 2 Education Assessment Education Provided To: Patient and Caregiver Education Topics Provided Wound/Skin Impairment: Handouts: Other: wound care as ordered Methods: Demonstration, Explain/Verbal Responses: State content correctly Electronic Signature(s) Signed: 06/01/2019 4:49:07 PM By: Montey Hora Entered By: Montey Hora on 06/01/2019 13:46:25 Lata, Grady [name]. ([id number]) -------------------------------------------------------------------------------- Wound Assessment Details Patient Name: Husser, Pamella [name]. Date of Service: 06/01/2019 1:00 PM Medical Record Number: [id number] Patient Account Number: [id number] Date of Birth/Sex: 13-Sep-1928 (  85 y.o. F) Treating RN: Army Melia Primary Care Payden Bonus: [name], [name] Other Clinician: Referring Carmel Waddington: [name] Treating Seleny Allbright/Extender: Melburn Hake, HOYT Weeks in Treatment: 2 Wound Status Wound Number: 1 Primary Pressure Ulcer Etiology: Wound Location: Sacrum - Midline Wound Status: Open Wounding Event: Gradually Appeared Comorbid Chronic Obstructive Pulmonary Disease Date  Acquired: 12/28/2018 History: (COPD), Osteoarthritis Weeks Of Treatment: 2 Clustered Wound: No Photos Wound Measurements Length: (cm) 3 % Reduction i Width: (cm) 1 % Reduction i Depth: (cm) 0.8 Epithelializa Area: (cm) 2.356 Tunneling: Volume: (cm) 1.885 Undermining: n Area: -94.7% n Volume: -678.9% tion: None No No Wound Description Classification: Category/Stage III Foul Odor Aft Wound Margin: Epibole Slough/Fibrin Exudate Amount: Small Exudate Type: Serous Exudate Color: amber er Cleansing: No o No Wound Bed Granulation Amount: Large (67-100%) Exposed Structure Granulation Quality: Pink Fascia Exposed: No Necrotic Amount: Small (1-33%) Fat Layer (Subcutaneous Tissue) Exposed: Yes Necrotic Quality: Adherent Slough Tendon Exposed: No Muscle Exposed: No Joint Exposed: No Bone Exposed: No Treatment Notes Haubner, Jashayla [name]. ([id number]) Wound #1 (Midline Sacrum) Notes silvercel, BFD Electronic Signature(s) Signed: 06/01/2019 3:25:15 PM By: Army Melia Entered By: Army Melia on 06/01/2019 13:09:10 Prows, Lockridge ([id number]) -------------------------------------------------------------------------------- Vitals Details Patient Name: Austill, Tenise [name]. Date of Service: 06/01/2019 1:00 PM Medical Record Number: [id number] Patient Account Number: [id number] Date of Birth/Sex: [birth year] (85 y.o. F) Treating RN: Montey Hora Primary Care Beckem Tomberlin: [name], [name] Other Clinician: Referring Columbia Pandey: [name] Treating [place]/Extender: Melburn Hake, HOYT Weeks in Treatment: 2 Vital Signs Time Taken: 13:05 Temperature (F): 98.9 Height (in): 60 Pulse (bpm): 97 Weight (lbs): 130 Respiratory Rate (breaths/min): 16 Body Mass Index (BMI): 25.4 Blood Pressure (mmHg): 130/55 Reference Range: 80 - 120 mg / dl Electronic Signature(s) Signed: 06/01/2019 4:24:52 PM By: [name] RCP, RRT, CHT Entered By: [name] on  06/01/2019 13:05:42

## 2019-06-02 ENCOUNTER — Ambulatory Visit (INDEPENDENT_AMBULATORY_CARE_PROVIDER_SITE_OTHER): Payer: Medicare Other

## 2019-06-02 DIAGNOSIS — J449 Chronic obstructive pulmonary disease, unspecified: Secondary | ICD-10-CM | POA: Diagnosis not present

## 2019-06-02 DIAGNOSIS — L89153 Pressure ulcer of sacral region, stage 3: Secondary | ICD-10-CM | POA: Diagnosis not present

## 2019-06-02 DIAGNOSIS — M170 Bilateral primary osteoarthritis of knee: Secondary | ICD-10-CM | POA: Diagnosis not present

## 2019-06-02 DIAGNOSIS — Z0000 Encounter for general adult medical examination without abnormal findings: Secondary | ICD-10-CM

## 2019-06-02 DIAGNOSIS — I10 Essential (primary) hypertension: Secondary | ICD-10-CM | POA: Diagnosis not present

## 2019-06-02 DIAGNOSIS — I872 Venous insufficiency (chronic) (peripheral): Secondary | ICD-10-CM | POA: Diagnosis not present

## 2019-06-02 DIAGNOSIS — I890 Lymphedema, not elsewhere classified: Secondary | ICD-10-CM | POA: Diagnosis not present

## 2019-06-02 NOTE — Patient Instructions (Signed)
Joyce [name] , Thank you for taking time to come for your Medicare Wellness Visit. I appreciate your ongoing commitment to your health goals. Please review the following plan we discussed and let me know if I can assist you in the future.   Screening recommendations/referrals: Colonoscopy: No longer required.  Mammogram: No longer required.  Bone Density: Declined order today.  Recommended yearly ophthalmology/optometry visit for glaucoma screening and checkup Recommended yearly dental visit for hygiene and checkup  Vaccinations: Influenza vaccine: Up to date Pneumococcal vaccine: Completed series Tdap vaccine: Up to date, due 12/2025 Shingles vaccine: Pt declines today.     Advanced directives: Please bring a copy of your POA (Power of Attorney) and/or Living Will to your next appointment.   Conditions/risks identified: Fall risk prevention discussed with daughter today.   Next appointment: 06/15/19 with Dr Rosanna Randy.    Preventive Care 83 Years and Older, Female Preventive care refers to lifestyle choices and visits with your health care provider that can promote health and wellness. What does preventive care include?  A yearly physical exam. This is also called an annual well check.  Dental exams once or twice a year.  Routine eye exams. Ask your health care provider how often you should have your eyes checked.  Personal lifestyle choices, including:  Daily care of your teeth and gums.  Regular physical activity.  Eating a healthy diet.  Avoiding tobacco and drug use.  Limiting alcohol use.  Practicing safe sex.  Taking low-dose aspirin every day.  Taking vitamin and mineral supplements as recommended by your health care provider. What happens during an annual well check? The services and screenings done by your health care provider during your annual well check will depend on your age, overall health, lifestyle risk factors, and family history of disease. Counseling   Your health care provider may ask you questions about your:  Alcohol use.  Tobacco use.  Drug use.  Emotional well-being.  Home and relationship well-being.  Sexual activity.  Eating habits.  History of falls.  Memory and ability to understand (cognition).  Work and work Statistician.  Reproductive health. Screening  You may have the following tests or measurements:  Height, weight, and BMI.  Blood pressure.  Lipid and cholesterol levels. These may be checked every 5 years, or more frequently if you are over 83 years old.  Skin check.  Lung cancer screening. You may have this screening every year starting at age 24 if you have a 30-pack-year history of smoking and currently smoke or have quit within the past 15 years.  Fecal occult blood test (FOBT) of the stool. You may have this test every year starting at age 83.  Flexible sigmoidoscopy or colonoscopy. You may have a sigmoidoscopy every 5 years or a colonoscopy every 10 years starting at age 83.  Hepatitis C blood test.  Hepatitis B blood test.  Sexually transmitted disease (STD) testing.  Diabetes screening. This is done by checking your blood sugar (glucose) after you have not eaten for a while (fasting). You may have this done every 1-3 years.  Bone density scan. This is done to screen for osteoporosis. You may have this done starting at age 83.  Mammogram. This may be done every 1-2 years. Talk to your health care provider about how often you should have regular mammograms. Talk with your health care provider about your test results, treatment options, and if necessary, the need for more tests. Vaccines  Your health care provider may recommend  certain vaccines, such as:  Influenza vaccine. This is recommended every year.  Tetanus, diphtheria, and acellular pertussis (Tdap, Td) vaccine. You may need a Td booster every 10 years.  Zoster vaccine. You may need this after age 83.  Pneumococcal 13-valent  conjugate (PCV13) vaccine. One dose is recommended after age 83.  Pneumococcal polysaccharide (PPSV23) vaccine. One dose is recommended after age 83. Talk to your health care provider about which screenings and vaccines you need and how often you need them. This information is not intended to replace advice given to you by your health care provider. Make sure you discuss any questions you have with your health care provider. Document Released: 11/17/2015 Document Revised: 07/10/2016 Document Reviewed: 08/22/2015 Elsevier Interactive Patient Education  2017 [employer] Prevention in the Home Falls can cause injuries. They can happen to people of all ages. There are many things you can do to make your home safe and to help prevent falls. What can I do on the outside of my home?  Regularly fix the edges of walkways and driveways and fix any cracks.  Remove anything that might make you trip as you walk through a door, such as a raised step or threshold.  Trim any bushes or trees on the path to your home.  Use bright outdoor lighting.  Clear any walking paths of anything that might make someone trip, such as rocks or tools.  Regularly check to see if handrails are loose or broken. Make sure that both sides of any steps have handrails.  Any raised decks and porches should have guardrails on the edges.  Have any leaves, snow, or ice cleared regularly.  Use sand or salt on walking paths during winter.  Clean up any spills in your garage right away. This includes oil or grease spills. What can I do in the bathroom?  Use night lights.  Install grab bars by the toilet and in the tub and shower. Do not use towel bars as grab bars.  Use non-skid mats or decals in the tub or shower.  If you need to sit down in the shower, use a plastic, non-slip stool.  Keep the floor dry. Clean up any water that spills on the floor as soon as it happens.  Remove soap buildup in the tub or  shower regularly.  Attach bath mats securely with double-sided non-slip rug tape.  Do not have throw rugs and other things on the floor that can make you trip. What can I do in the bedroom?  Use night lights.  Make sure that you have a light by your bed that is easy to reach.  Do not use any sheets or blankets that are too big for your bed. They should not hang down onto the floor.  Have a firm chair that has side arms. You can use this for support while you get dressed.  Do not have throw rugs and other things on the floor that can make you trip. What can I do in the kitchen?  Clean up any spills right away.  Avoid walking on wet floors.  Keep items that you use a lot in easy-to-reach places.  If you need to reach something above you, use a strong step stool that has a grab bar.  Keep electrical cords out of the way.  Do not use floor polish or wax that makes floors slippery. If you must use wax, use non-skid floor wax.  Do not have throw rugs and other  things on the floor that can make you trip. What can I do with my stairs?  Do not leave any items on the stairs.  Make sure that there are handrails on both sides of the stairs and use them. Fix handrails that are broken or loose. Make sure that handrails are as long as the stairways.  Check any carpeting to make sure that it is firmly attached to the stairs. Fix any carpet that is loose or worn.  Avoid having throw rugs at the top or bottom of the stairs. If you do have throw rugs, attach them to the floor with carpet tape.  Make sure that you have a light switch at the top of the stairs and the bottom of the stairs. If you do not have them, ask someone to add them for you. What else can I do to help prevent falls?  Wear shoes that:  Do not have high heels.  Have rubber bottoms.  Are comfortable and fit you well.  Are closed at the toe. Do not wear sandals.  If you use a stepladder:  Make sure that it is fully  opened. Do not climb a closed stepladder.  Make sure that both sides of the stepladder are locked into place.  Ask someone to hold it for you, if possible.  Clearly mark and make sure that you can see:  Any grab bars or handrails.  First and last steps.  Where the edge of each step is.  Use tools that help you move around (mobility aids) if they are needed. These include:  Canes.  Walkers.  Scooters.  Crutches.  Turn on the lights when you go into a dark area. Replace any light bulbs as soon as they burn out.  Set up your furniture so you have a clear path. Avoid moving your furniture around.  If any of your floors are uneven, fix them.  If there are any pets around you, be aware of where they are.  Review your medicines with your doctor. Some medicines can make you feel dizzy. This can increase your chance of falling. Ask your doctor what other things that you can do to help prevent falls. This information is not intended to replace advice given to you by your health care provider. Make sure you discuss any questions you have with your health care provider. Document Released: 08/17/2009 Document Revised: 03/28/2016 Document Reviewed: 11/25/2014 Elsevier Interactive Patient Education  2017 [employer].

## 2019-06-02 NOTE — Progress Notes (Signed)
Subjective:   Joyce [name] is a 85 y.o. female who presents for an Initial Medicare Annual Wellness Visit.    This visit is being conducted through telemedicine due to the COVID-19 pandemic. This patient has given me verbal consent via doximity to conduct this visit, patient states they are participating from their home address. Some vital signs may be absent or patient reported.    Patient identification: identified by name, DOB, and current address  Review of Systems    N/A   Cardiac Risk Factors include: advanced age (>81mn, >>57women);dyslipidemia     Objective:    Today's Vitals   06/02/19 0831  PainSc: 0-No pain   There is no height or weight on file to calculate BMI. Unable to obtain vitals due to visit being conducted via telephonically.   Advanced Directives 06/02/2019 02/15/2019 01/29/2019 01/29/2019 01/17/2019 01/17/2019 12/24/2018  Does Patient Have a Medical Advance Directive? Yes No Yes Yes Yes No No  Type of [place];Out of facility DNR (pink MOST or yellow form) Healthcare Power of [place]mit- -  Does patient want to make changes to medical advance directive? - - No - Patient declined - No - Patient declined - -  Copy of [place] Chart? No - copy requested - No - copy requested - No - copy requested - -  Would patient like information on creating a medical advance directive? - - No - Patient declined - No - Patient declined - No - Patient declined    Current Medications (verified) Outpatient Encounter Medications as of 06/02/2019  Medication Sig  . acetaminophen (TYLENOL) 325 MG tablet Take 650 mg by mouth 2 (two) times daily. Taking two tablets every morning.  .Marland Kitchenacidophilus (RISAQUAD) CAPS capsule Take 1 capsule by mouth daily.  .Marland Kitchenalbuterol (PROVENTIL HFA;VENTOLIN HFA) 108 (90 Base) MCG/ACT inhaler Inhale 1-2 puffs into the lungs  every 4 (four) hours as needed for wheezing or shortness of breath.  .Marland Kitchenalbuterol (PROVENTIL) (2.5 MG/3ML) 0.083% nebulizer solution Take 3 mLs (2.5 mg total) by nebulization every 6 (six) hours as needed for wheezing or shortness of breath.  .Marland Kitchenalendronate (FOSAMAX) 70 MG tablet Take 1 tablet (70 mg total) by mouth once a week. Take with a full glass of water on an empty stomach.  .Marland Kitchenatorvastatin (LIPITOR) 10 MG tablet Take 1 tablet (10 mg total) by mouth daily.  . calcium carbonate (TUMS - DOSED IN MG ELEMENTAL CALCIUM) 500 MG chewable tablet Chew 2 tablets by mouth daily.   . cholecalciferol (VITAMIN D) 1000 units tablet Take 2,000 Units by mouth daily.  .Marland Kitchendiltiazem (DILACOR XR) 120 MG 24 hr capsule Take 1 capsule (120 mg total) by mouth daily. Take 2 capsules by mouth every morning, and 1 capsule every evening. (Patient taking differently: Take 120 mg by mouth as directed. Take 2 capsules by mouth every morning, and 1 capsule every evening.)  . feeding supplement, ENSURE ENLIVE, (ENSURE ENLIVE) LIQD Take 237 mLs by mouth 2 (two) times daily between meals.  . Fluticasone-Salmeterol (ADVAIR) 500-50 MCG/DOSE AEPB Inhale 1 puff into the lungs 2 (two) times daily.   .Marland Kitchenlevothyroxine (SYNTHROID, LEVOTHROID) 150 MCG tablet Take 1 tablet (150 mcg total) by mouth daily before breakfast.  . LORazepam (ATIVAN) 0.5 MG tablet Take 0.5 tablets (0.25 mg total) by mouth 3 (three) times daily as needed for anxiety.  . mirtazapine (REMERON)  15 MG tablet Take 1 tablet (15 mg total) by mouth at bedtime.  . Multiple Vitamin (MULTIVITAMIN WITH MINERALS) TABS tablet Take 1 tablet by mouth daily.  . collagenase (SANTYL) ointment Apply topically daily. (Patient not taking: Reported on 06/02/2019)  . doxycycline (VIBRA-TABS) 100 MG tablet Take 1 tablet (100 mg total) by mouth 2 (two) times daily. (Patient not taking: Reported on 06/02/2019)   No facility-administered encounter medications on file as of 06/02/2019.      Allergies (verified) Patient has no known allergies.   History: Past Medical History:  Diagnosis Date  . Anxiety   . Arthritis   . Asthma   . Asthma   . Breast cancer ([place])   . Cancer ([place])   . COPD (chronic obstructive pulmonary disease) ([place])   . Enlarged aorta ([place])   . GERD (gastroesophageal reflux disease)   . High cholesterol   . Hx of skin cancer, basal cell   . Hyperlipemia   . Hypertension   . PUD (peptic ulcer disease)   . Thyroid cancer [facility])    Past Surgical History:  Procedure Laterality Date  . APPENDECTOMY    . BREAST SURGERY     left mastectomy  . CHOLECYSTECTOMY    . FRACTURE SURGERY    . JOINT REPLACEMENT    . MASTECTOMY    . REPAIR OF PERFORATED ULCER    . REPLACEMENT TOTAL KNEE BILATERAL    . STOMACH SURGERY    . THYROIDECTOMY     Family History  Problem Relation Age of Onset  . Hypertension Father   . Colon cancer Father   . Stroke Mother   . Bone cancer Son   . Throat cancer Child   . Hyperlipidemia Daughter   . Esophageal cancer Son   . Cancer Son        bladder and myeloma  . Hypertension Son   . Multiple myeloma Daughter   . Hypertension Daughter   . Hyperlipidemia Daughter    Social History   Socioeconomic History  . Marital status: Widowed    Spouse name: Not on file  . Number of children: 7  . Years of education: Not on file  . Highest education level: High school graduate  Occupational History  . Occupation: retired  Scientific laboratory technician  . Financial resource strain: Not hard at all  . Food insecurity    Worry: Never true    Inability: Never true  . Transportation needs    Medical: No    Non-medical: No  Tobacco Use  . Smoking status: Never Smoker  . Smokeless tobacco: Never Used  Substance and Sexual Activity  . Alcohol use: Not Currently  . Drug use: No  . Sexual activity: Never  Lifestyle  . Physical activity    Days per week: 0 days    Minutes per session: 0 min  . Stress: Not at all  Relationships  . Social  Herbalist on phone: Patient refused    Gets together: Patient refused    Attends religious service: Patient refused    Active member of club or organization: Patient refused    Attends meetings of clubs or organizations: Patient refused    Relationship status: Patient refused  Other Topics Concern  . Not on file  Social History Narrative   Living with daughter- ambulates with a walker    Tobacco Counseling Counseling given: Not Answered   Clinical Intake:  Pre-visit preparation completed: Yes  Pain Score: 0-No pain  Nutritional Risks: Non-healing wound(Has an open wound on her bottom. Pt sees wound care for this.) Diabetes: No  How often do you need to have someone help you when you read instructions, pamphlets, or other written materials from your doctor or pharmacy?: 1 - Never  Interpreter Needed?: No  Information entered by :: [facility], LPN   Activities of Daily Living In your present state of health, do you have any difficulty performing the following activities: 06/02/2019 03/04/2019  Hearing? Y N  Comment Does not wear hearing aids. -  Vision? N N  Difficulty concentrating or making decisions? N N  Walking or climbing stairs? Y Y  Comment Due to SOB. uses a walker with all ambuation  Dressing or bathing? Y Y  Comment Daughter helps pt in and out of the shower. needs assistance with bathing to prevent falls  Doing errands, shopping? Y Y  Comment Does not drive. -  Preparing Food and eating ? N Y  Using the Toilet? N N  In the past six months, have you accidently leaked urine? Y Y  Comment Due to overactive bladder. Wears protection daily. -  Do you have problems with loss of bowel control? N N  Managing your Medications? Tempie Donning  Comment Daughter manages medications for pt. -  Managing your Finances? N N  Housekeeping or managing your Housekeeping? Tempie Donning  Comment Daughter manages all housework. -  Some recent data might be hidden      Immunizations and Health Maintenance Immunization History  Administered Date(s) Administered  . Influenza, High Dose Seasonal PF 07/30/2017, 08/06/2018  . Pneumococcal Conjugate-13 11/30/2013  . Pneumococcal Polysaccharide-23 08/12/2011  . Tdap 11/30/2013   Health Maintenance Due  Topic Date Due  . DEXA SCAN  03/10/2015    Patient Care Team: Jerrol Banana., MD as PCP - General (Family Medicine) Benedetto Goad, RN as Case Manager Woodroe Chen III, PA-C as Physician Assistant (Physician Assistant)  Indicate any recent Medical Services you may have received from other than [facility] providers in the past year (date may be approximate).     Assessment:   This is a routine wellness examination for Tasmine.  Hearing/Vision screen No exam data present  Dietary issues and exercise activities discussed: Current Exercise Habits: Home exercise routine, Type of exercise: walking;stretching, Intensity: Mild, Exercise limited by: orthopedic condition(s);respiratory conditions(s)  Goals      Patient Stated   . per daughter "Her cellulitis keeps returning and I am not sure how to keep managing it" (pt-stated)     Current Barriers:  Marland Kitchen Knowledge Deficits related to understanding plan of care for Cellulitis treatment  Nurse Case Manager Clinical Goal(s):  Marland Kitchen Over the next 30 days, patient will not experience hospital admission. Hospital Admissions in last 6 months = 3 . Over the next 14 days, patient will attend all scheduled medical appointments: Daughter to request acute appointment to evaluate right leg cellulitis . Over the next 14 days, patient will verbalize basic understanding of cellulitis/complications of disease process and self health management plan as evidenced by keeping leg elevated as often as possible, taking medications as prescribed, reporting new or worsening symptoms . Over the next 30 days, patient will work with [place] (community agency) to additional needs  as necessary  Interventions:  . Advised patient to call provider for acute appointment to evaluate cellulitis and to establish a plan of care . Provided education to patient re: cellulitis complications and treatment . Discussed plans with  patient for ongoing care management follow up and provided patient with direct contact information for care management team . Provided patient with written educational materials related to cellulitis management . Referral to [facility] Pharmacist for medication review  Reviewed scheduled/upcoming provider appointments including: 03/11/2019 AWV and PCP yearly physical  Patient Self Care Activities:  . Attends all scheduled provider appointments . Calls pharmacy for medication refills . Calls provider office for new concerns or questions  Initial goal documentation     . per daughter "I did not know she should be taking Advir every day. Its very expensive" (pt-stated)     Current Barriers:   Knowledge deficit related to basic COPD self care/management  Knowledge deficit related to basic understanding of how to use inhalers and how inhaled medications work  Nurse Case Manager Clinical Goal(s):  Over the next 14 days patient will report using inhalers as prescribed including rinsing mouth after use  Over the next 14 days patient will report utilizing pursed lip breathing for shortness of breath  Over the next 14 days, patient will be able to verbalize understanding of COPD action plan and when to seek appropriate levels of medical care  Over the next 14 days, patient will engage with [facility] pharmacist for medication management (Advair)   COPD ACTION PLAN Actions to Take if My Symptoms Get Worse   WHAT ZONE ARE YOU IN TODAY? Green, Yellow, or Red?    GREEN ZONE: I am doing well today.   Symptoms Actions .  Usual activity and exercise level . Usual amounts of cough or phlegm/musuc . Sleep well at night . Appetite is good . Continue to  take daily "maintenance" medicines (the ones you take "no matter what" until your doctor adjusts them for you) . Use oxygen as prescribed . Continue regular exercise/diet plan . At all times avoid cigarette smoke, inhaled iritants     YELLOW ZONE: I am having a bad day or a COPD flare.  Symptoms Actions .  More breathless than usual . I have less energy for my daily activities . Increased or thicker phlegm/mucus . Using quick relief inhaler/nebulizer more often . More coughing than usual . I feel like I have a "chest cold" . Poor sleep and my symptoms woke me up . My appetite is not good . My medicine is not helping . Swelling of ankles more than usual . Continue daily medications . Use quick relief inhaler or nebulizer every 4 hours . Use oxygen as prescribed . Get plenty of rest . Use pursed lip breathing . At all times avoid cigarette smoke, inhaled irritants.  Marland Kitchen CALL PROVIDER for same day or next day appointment for the following:  o If symptoms are not improving within 48 hours of onset or o If symptoms are not improving after quick relief inhaler or nebulizer o Start an oral corticosteroid (specify name, dose, and duration) . Start an antibiotic (specify name, dose, duration)    RED ZONE: I NEED URGENT MEDICAL CARE  Symptoms Actions .  Severe shortness of breath even at rest . Severe shortness of breath even after quick relief inhaler or nebulizer . Not able to lay flat because of breathing . Fever or shaking chills . Feeling confused or very drowsy . Chest pains . Coughin up blood . Quick relief inhaler or nebulizer not effective . Call 911 or have someone take you to the nearest emergency room . Call provider for now or same day appointment  3-2-1 PLAN: If any of the 3 main COPD symptoms (Cough, Shortness of Breath, or Mucus Production) change for 2 days or more, your number 1 priority if to call your doctor.     Interventions:   Provided patient with basic  written and verbal COPD education on self care/management/and exacerbation prevention   Provided patient with COPD action plan and reinforced importance of daily self assessment  Referral to [facility] Pharmacist   Patient Self Care Activities:   Take medications as prescribed including inhalers  Practice and use pursed lip breathing for shortness of breath recovery and prevention  Self assess COPD action plan zone and make appointment with provider if you have been in the yellow zone for 48 hours without improvement.  Utilize infection prevention strategies to reduce risk of respiratory infection (handwashing, pulmonary toileting, medication compliance)     . per daughter "Mom keeps getting pneumonia" (pt-stated)     Current Barriers:  Marland Kitchen Knowledge Deficits related to understanding how to prevent aspitation pneumonia  . Covid-19 restriction preventing daughter/caregiver from being present during last hospitalization for pneumonia . Patients cognition and ability to recall details of hospitalization  Nurse Case Manager Clinical Goal(s):  Marland Kitchen Over the next 14 days, patient will verbalize understanding of plan for aspiration pneumonia prevention . Over the next 30 days, patient will not experience hospital admission. Hospital Admissions in last 6 months = 3 . Over the next 14 days, patient will demonstrate improved adherence to prescribed treatment plan for aspiration pneumonia prevention as evidenced bysitting up at 90 degrees during meals, eating a Dysphagia 2 diet, taking pills whole in applesauce, and drinking thin liquids without a straw  Interventions:  . Evaluation of current treatment plan related to pneumonia prevention and patient's adherence to plan as established by provider. . Provided education to patient re: aspitation pneumonia and how to prevent reoccurance . Discussed plans with patient for ongoing care management follow up and provided patient with direct contact  information for care management team . Provided patient with written educational materials related to pneumonia prevention/signs and symptoms of infection . Reviewed scheduled/upcoming provider appointments including:  . Pharmacy referral for AWV and PCP physical 03/11/2019  Patient Self Care Activities:  . Self administers medications as prescribed . Attends all scheduled provider appointments . Calls pharmacy for medication refills . Calls provider office for new concerns or questions  . Adhere to Dysphagia 2 diet   Initial goal documentation       Other   . LIFESTYLE - DECREASE FALLS RISK     Recommend to remove any items from the home that may cause slips or trips.      Depression Screen PHQ 2/9 Scores 06/02/2019 04/13/2019 12/31/2018 12/17/2018 08/06/2018  PHQ - 2 Score 0 0 0 0 0    Fall Risk Fall Risk  06/02/2019 03/04/2019 12/31/2018 12/17/2018 08/06/2018  Falls in the past year? 1 1 0 0 No  Number falls in past yr: 1 1 - - -  Injury with Fall? 0 0 - - -  Risk for fall due to : - History of fall(s) - - -  Follow up Falls prevention discussed Falls evaluation completed;Falls prevention discussed;Education provided - - -   FALL RISK PREVENTION PERTAINING TO THE HOME:  Any stairs in or around the home? Yes  If so, are there any without handrails? No  Home free of loose throw rugs in walkways, pet beds, electrical cords, etc? Yes  Adequate lighting in your home to  reduce risk of falls? Yes   ASSISTIVE DEVICES UTILIZED TO PREVENT FALLS:  Life alert? Yes  Use of a cane, walker or w/c? Yes  Grab bars in the bathroom? No  Shower chair or bench in shower? Yes  Elevated toilet seat or a handicapped toilet? Yes    TIMED UP AND GO:  Was the test performed? No .     Cognitive Function:        Screening Tests Health Maintenance  Topic Date Due  . DEXA SCAN  03/10/2015  . INFLUENZA VACCINE  06/05/2019  . TETANUS/TDAP  12/01/2023  . PNA vac Low Risk Adult  Completed     Qualifies for Shingles Vaccine? Yes . Due for Shingrix. Education has been provided regarding the importance of this vaccine. Pt has been advised to call insurance company to determine out of pocket expense. Advised may also receive vaccine at local pharmacy or Health Dept. Verbalized acceptance and understanding.  Tdap: Up to date  Flu Vaccine: Up to date  Pneumococcal Vaccine: Completed series  Cancer Screenings:  Colorectal Screening: No longer required.   Mammogram: No longer required.   Bone Density: Completed 03/09/13. Results reflect OSTEOPOROSIS. Repeat every 2 years. Daughter declined order today.   Lung Cancer Screening: (Low Dose CT Chest recommended if Age 69-80 years, 30 pack-year currently smoking OR have quit w/in 15years.) does not qualify.   Additional Screening:  Vision Screening: Recommended annual ophthalmology exams for early detection of glaucoma and other disorders of the eye.  Dental Screening: Recommended annual dental exams for proper oral hygiene  Community Resource Referral:  CRR required this visit?  No       Plan:  I have personally reviewed and addressed the Medicare Annual Wellness questionnaire and have noted the following in the patient's chart:  A. Medical and social history B. Use of alcohol, tobacco or illicit drugs  C. Current medications and supplements D. Functional ability and status E.  Nutritional status F.  Physical activity G. Advance directives H. List of other physicians I.  Hospitalizations, surgeries, and ER visits in previous 12 months J.  [place] such as hearing and vision if needed, cognitive and depression L. Referrals and appointments   In addition, I have reviewed and discussed with patient certain preventive protocols, quality metrics, and best practice recommendations. A written personalized care plan for preventive services as well as general preventive health recommendations were provided to patient.    Glendora Score, [place]   [id number]  Nurse Health Advisor   Nurse Notes: Declined DEXA order today.

## 2019-06-04 ENCOUNTER — Other Ambulatory Visit: Payer: Self-pay

## 2019-06-04 ENCOUNTER — Telehealth: Payer: Self-pay

## 2019-06-04 ENCOUNTER — Other Ambulatory Visit: Payer: Medicare Other

## 2019-06-04 DIAGNOSIS — Z515 Encounter for palliative care: Secondary | ICD-10-CM

## 2019-06-04 NOTE — Telephone Encounter (Signed)
Telephone call to patient's [name] to schedule palliative care visit.  Daughte in agreement with nurse making home visit on Tuesday 06-08-19 at 11:00 AM.

## 2019-06-04 NOTE — Progress Notes (Signed)
COMMUNITY PALLIATIVE CARE SW NOTE  PATIENT NAME: Joyce [name] DOB: Sep 17, 1928 MRN: [id number]  PRIMARY CARE PROVIDER: Jerrol Banana., MD  RESPONSIBLE PARTY:  Acct ID - Guarantor Home Phone Work Phone Relationship Acct Type  [id number] Joyce [name], Joyce [name]  Self P/F     [address] [place], [place] [address]     PLAN OF CARE and INTERVENTIONS:             1. GOALS OF CARE/ ADVANCE CARE PLANNING:  Goal is for patient to remain at home with family as long as possible. Patient is a DNR,form is in the home. Completed HCPOA/Living Will form today.  2. SOCIAL/EMOTIONAL/SPIRITUAL ASSESSMENT/ INTERVENTIONS:  SW and Emily, SW met with patient and [name] (patient's daughter) to complete HCPOA/Living Will at patient's request. Patient is doing "well". Patient is going to wound clinic once a week and having a home health nurse visit twice a week. Patient said her wound is healing and feels "better". [name] said the wound clinic is wrapping patient's legs and this seems to be helping. Patient agreed. Patient was sitting up in her chair, smiling and engaged with SW. Patient said she got a haircut this week and likes her new style. 3. PATIENT/CAREGIVER EDUCATION/ COPING:  Patient and [name] seemed to be calmer, and coping well. [name] is grateful for wound care and feels that patient is improving. No concerns at this time. 4. PERSONAL EMERGENCY PLAN:  Family will call [phone]. Patient has a LCabin crewDue to COVID-19,familyverbalized thatshe is remaining at home other than appointments. 5. COMMUNITY RESOURCES COORDINATION/ HEALTH CARE NAVIGATION:  [name] coordinates patient's care. [facility] is providing home health nursing. Virgie, RN to visit 8/4 and PCP follow-up on 8/11. No needs at this time. 6. FINANCIAL/LEGAL CONCERNS/INTERVENTIONS:  None.     SOCIAL HX:  Social History   Tobacco Use  . Smoking status: Never Smoker  . Smokeless tobacco: Never Used  Substance  Use Topics  . Alcohol use: Not Currently    CODE STATUS:   Code Status: Prior (DNR) ADVANCED DIRECTIVES: Y MOST FORM COMPLETE:  No. HOSPICE EDUCATION PROVIDED: None.   PPS: Patient is able to feed herself and preparesmallmeals. Patient needsassistance bathing. Patientambulates with her walker.  I spent360mutes with patient/family, [id number], support and consultation.   WhMargaretmary LombardLCSW

## 2019-06-07 ENCOUNTER — Ambulatory Visit: Payer: Medicare Other | Admitting: Physician Assistant

## 2019-06-07 ENCOUNTER — Other Ambulatory Visit: Payer: Self-pay

## 2019-06-07 ENCOUNTER — Other Ambulatory Visit: Payer: Medicare Other | Admitting: Nurse Practitioner

## 2019-06-07 ENCOUNTER — Encounter: Payer: Self-pay | Admitting: Nurse Practitioner

## 2019-06-07 DIAGNOSIS — R413 Other amnesia: Secondary | ICD-10-CM

## 2019-06-07 DIAGNOSIS — Z515 Encounter for palliative care: Secondary | ICD-10-CM

## 2019-06-07 DIAGNOSIS — R630 Anorexia: Secondary | ICD-10-CM

## 2019-06-07 NOTE — Progress Notes (Signed)
[facility] Palliative Care Consult Note Telephone: [phone]  Fax: [phone]  PATIENT NAME: Joyce [name] DOB: Nov 04, 1928 MRN: [id number]  PRIMARY CARE PROVIDER:   Jerrol [name]., MD  REFERRING PROVIDER:  Jerrol [name]., MD [address],  [place] [address]  RESPONSIBLE PARTY:   Joyce [name] daughter [phone]  Due to the COVID-19 crisis, this visit was done via telemedicine from my office and it was initiated and consent by this patient and or family.  RECOMMENDATIONS and PLAN: 1.Palliative care encounter Z51.5; Palliative medicine team will continue to support patient, patient's family, and medical team. Visit consisted of counseling and education dealing with the complex and emotionally intense issues of symptom management and palliative care in the setting of serious and potentially life-threatening illness  2.Memory loss R41.3 appears progressive. Medical goals to continue to focus on Comfort, redirecting with supportive measures.  3.Anorexia R63.0appetite continues to improve. Continue to encourage supplements, frequent meals  ASSESSMENT:     I called Joyce [name]'s daughter, Joyce [name] for schedule palliative care follow-up visit. We talked about purpose for palliative care visit and Joyce [name] in agreement. Joyce [name] endorses Joyce [name]'s doing much better. She continues to go to the wound care center ambulance have been healing. Edema has been improved in her lower extremities. She is ambulating with a walker. Appetite continues to improve. Joyce [name] endorses a schedule face-to-face visit by palliative care pm/pm nurse tomorrow. She is also receiving Kayenta. She is not sure how long that will last. Medical goals continue to focus on treat what is treatable, comfort and DNR in place. Discussed will schedule a follow-up palliative care nurse practitioner visit in 2 months if needed or sooner should she declined.  Joyce [name] in agreement. Therapeutic listening and emotional support provided. At this time will continue with Home Health but should she start to decline, decrease appetite will revisit hospice. Questions answered to satisfaction. Contact information  I spent 40 minutes providing this consultation,  from 11:00am to 11:40am. More than 50% of the time in this consultation was spent coordinating communication.   HISTORY OF PRESENT ILLNESS:  Joyce [name] is a 85 y.o. year old female with multiple medical problems including COPD, enlarged aorta, breast cancer s/p, thyroid cancer s/p thyroidectomy, peptic ulcer disease, hypertension, GERD, asthma, anxiety.Severe kyphosis. She continues to live at home with her daughter, Joyce [name]. Palliative Care was asked to help to continue to address goals of care.   CODE STATUS: DNR  PPS: 40% HOSPICE ELIGIBILITY/DIAGNOSIS: TBD  PAST MEDICAL HISTORY:  Past Medical History:  Diagnosis Date  . Anxiety   . Arthritis   . Asthma   . Asthma   . Breast cancer ([facility])   . Cancer ([facility])   . COPD (chronic obstructive pulmonary disease) ([facility])   . Enlarged aorta ([facility])   . GERD (gastroesophageal reflux disease)   . High cholesterol   . Hx of skin cancer, basal cell   . Hyperlipemia   . Hypertension   . PUD (peptic ulcer disease)   . Thyroid cancer ([facility])     SOCIAL HX:  Social History   Tobacco Use  . Smoking status: Never Smoker  . Smokeless tobacco: Never Used  Substance Use Topics  . Alcohol use: Not Currently    ALLERGIES: No Known Allergies   PERTINENT MEDICATIONS:  Outpatient Encounter Medications as of 06/07/2019  Medication Sig  . acetaminophen (TYLENOL) 325 MG tablet Take 650 mg by mouth 2 (  two) times daily. Taking two tablets every morning.  Marland Kitchen acidophilus (RISAQUAD) CAPS capsule Take 1 capsule by mouth daily.  Marland Kitchen albuterol (PROVENTIL HFA;VENTOLIN HFA) 108 (90 Base) MCG/ACT inhaler Inhale 1-2 puffs into the lungs every 4 (four) hours as needed for wheezing  or shortness of breath.  Marland Kitchen albuterol (PROVENTIL) (2.5 MG/3ML) 0.083% nebulizer solution Take 3 mLs (2.5 mg total) by nebulization every 6 (six) hours as needed for wheezing or shortness of breath.  Marland Kitchen alendronate (FOSAMAX) 70 MG tablet Take 1 tablet (70 mg total) by mouth once a week. Take with a full glass of water on an empty stomach.  Marland Kitchen atorvastatin (LIPITOR) 10 MG tablet Take 1 tablet (10 mg total) by mouth daily.  . calcium carbonate (TUMS - DOSED IN MG ELEMENTAL CALCIUM) 500 MG chewable tablet Chew 2 tablets by mouth daily.   . cholecalciferol (VITAMIN D) 1000 units tablet Take 2,000 Units by mouth daily.  . collagenase (SANTYL) ointment Apply topically daily. (Patient not taking: Reported on 06/02/2019)  . diltiazem (DILACOR XR) 120 MG 24 hr capsule Take 1 capsule (120 mg total) by mouth daily. Take 2 capsules by mouth every morning, and 1 capsule every evening. (Patient taking differently: Take 120 mg by mouth as directed. Take 2 capsules by mouth every morning, and 1 capsule every evening.)  . doxycycline (VIBRA-TABS) 100 MG tablet Take 1 tablet (100 mg total) by mouth 2 (two) times daily. (Patient not taking: Reported on 06/02/2019)  . feeding supplement, ENSURE ENLIVE, (ENSURE ENLIVE) LIQD Take 237 mLs by mouth 2 (two) times daily between meals.  . Fluticasone-Salmeterol (ADVAIR) 500-50 MCG/DOSE AEPB Inhale 1 puff into the lungs 2 (two) times daily.   Marland Kitchen levothyroxine (SYNTHROID, LEVOTHROID) 150 MCG tablet Take 1 tablet (150 mcg total) by mouth daily before breakfast.  . LORazepam (ATIVAN) 0.5 MG tablet Take 0.5 tablets (0.25 mg total) by mouth 3 (three) times daily as needed for anxiety.  . mirtazapine (REMERON) 15 MG tablet Take 1 tablet (15 mg total) by mouth at bedtime.  . Multiple Vitamin (MULTIVITAMIN WITH MINERALS) TABS tablet Take 1 tablet by mouth daily.   No facility-administered encounter medications on file as of 06/07/2019.     PHYSICAL EXAM:   Deferred  Joyce Z Gusler, NP

## 2019-06-08 ENCOUNTER — Encounter: Payer: Medicare Other | Attending: Physician Assistant | Admitting: Physician Assistant

## 2019-06-08 ENCOUNTER — Other Ambulatory Visit: Payer: Self-pay

## 2019-06-08 ENCOUNTER — Ambulatory Visit: Payer: Medicare Other | Admitting: Physician Assistant

## 2019-06-08 ENCOUNTER — Other Ambulatory Visit: Payer: Medicare Other

## 2019-06-08 DIAGNOSIS — Z515 Encounter for palliative care: Secondary | ICD-10-CM

## 2019-06-08 DIAGNOSIS — I890 Lymphedema, not elsewhere classified: Secondary | ICD-10-CM | POA: Insufficient documentation

## 2019-06-08 DIAGNOSIS — L89153 Pressure ulcer of sacral region, stage 3: Secondary | ICD-10-CM | POA: Diagnosis not present

## 2019-06-08 DIAGNOSIS — I872 Venous insufficiency (chronic) (peripheral): Secondary | ICD-10-CM | POA: Diagnosis not present

## 2019-06-08 DIAGNOSIS — J449 Chronic obstructive pulmonary disease, unspecified: Secondary | ICD-10-CM | POA: Diagnosis not present

## 2019-06-08 DIAGNOSIS — I10 Essential (primary) hypertension: Secondary | ICD-10-CM | POA: Insufficient documentation

## 2019-06-08 DIAGNOSIS — L89159 Pressure ulcer of sacral region, unspecified stage: Secondary | ICD-10-CM | POA: Diagnosis not present

## 2019-06-08 NOTE — Progress Notes (Signed)
PATIENT NAME: Joyce [name] DOB: April 11, 1928 MRN: [id number]  PRIMARY CARE PROVIDER: Jerrol Banana., MD  RESPONSIBLE PARTY:  Acct ID - Guarantor Home Phone Work Phone Relationship Acct Type  [id number] [name], [name]970 [phone]  Self P/F     [address] [place], [place] [address]    PLAN OF CARE and INTERVENTIONS:               1.  GOALS OF CARE/ ADVANCE CARE PLANNING: Remain at home with her daughter and son in law                2.  PATIENT/CAREGIVER EDUCATION:  Education on fall precautions, education on s/s of infection, education to patient on importance of keeping LE's elevated to reduce edema.  Education on importance of repositioning to prevent skin breakdown.                  3.  DISEASE STATUS: Patient is a 85 year old patient who resides in home with her daughter and son in law.  RN made home visit to see patient.  Patient had appointment at the wound center this AM.  Patient has a stage II sacral decub that is healing.  Patient receiving Home Health services for wound care.  Patient and daughter report sacral wound is 1/2 the size it was when patient begin going to the wound center. Daughter has obtained a memory foam cushion for patient to sit on.  Patient continues to have edema in her lower extremities with a small amount of redness to area.  Daughter reports patient has compression stockings to wear.  Patient denies suffering any falls.  Patient having increased congestion with wheezing. Patient has a productive cough of clear sputum.  Daughter states she will administer nebulizer treatment to patient after RN leaves home.  Patient and daughter report patient's appetite is good. Patient has gained 15 pounds since last hospitalization.  Daughter reports patient has been sleeping well since Mirtazapine was increased. Patient and daughter deny patient suffering any falls.  Daughter remains in agreement with palliative care services for patient.  Patient and daughter informed to  call with questions or concerns.                    HISTORY OF PRESENT ILLNESS:    CODE STATUS: DNR  ADVANCED DIRECTIVES: Y MOST FORM: No PPS: 50%   PHYSICAL EXAM:   VITALS: See vital signs  LUNGS: expiratory wheezes bilaterally, scattered rhonchi bilaterally CARDIAC: Cor RRR  EXTREMITIES: 3+ edema SKIN: Skin color, texture, turgor normal. No rashes or lesions or erythema - lower leg(s) bilateral and varicosities - lower leg(s) bilateral  NEURO: positive for gait problems and weakness       [name], RN

## 2019-06-08 NOTE — Progress Notes (Addendum)
YAJAYRA, FELDT ([id number]) Visit Report for 06/08/2019 Chief Complaint Document Details Patient Name: Bogus, [name] Date of Service: 06/08/2019 9:30 AM Medical Record Number: [id number] Patient Account Number: [id number] Date of Birth/Sex: 1928-03-22 (85 y.o. F) Treating RN: Montey Hora Primary Care Provider: [name], [name] Other Clinician: Referring Provider: [name] Treating Provider/Extender: Melburn Hake, Kylena Mole Weeks in Treatment: 3 Information Obtained from: Patient Chief Complaint Sacral pressure ulcer and bilateral LE lymphedema Electronic Signature(s) Signed: 06/08/2019 9:40:55 AM By: [name] PA-C Entered By: [name] on 06/08/2019 09:40:55 Paladino, Bettey L. ([id number]) -------------------------------------------------------------------------------- HPI Details Patient Name: [name], [name] Date of Service: 06/08/2019 9:30 AM Medical Record Number: [id number] Patient Account Number: [id number] Date of Birth/Sex: [birth year] (85 y.o. F) Treating RN: Montey Hora Primary Care Provider: [name], [name] Other Clinician: Referring Provider: [name] Treating Provider/Extender: Sharalyn Ink in Treatment: 3 History of Present Illness HPI Description: 05/14/19 on evaluation today patient appears to be doing rather well in regard to her lower extremities compared to what it sounds like she was doing not that far back. Fortunately there's no signs of active infection at this time although it does sound like she had significant cellulitis earlier in the year. She does also have a issue with a sacral ulcer upon inspection today which has been present since around February 2020 this is about as long as she's been dealing with her legs as well. She is under palliative care but not hospice. The patient does have a history of scoliosis and kyphosis which makes it difficult for her to lay flat she actually sleep sitting up in a recliner but  night and reclined at this cause her trouble breathing. She also has COPD, hypertension, and bilateral lower should be lymphedema and venous stasis. Currently there's no signs of active cellulitis of the bilateral lower extremities which is good news. The patient has not had any vascular intervention nor evaluation that we have on file. She does spend the majority per day apparently sitting in her recliner upright. This I think is an issue both for her legs as well as for the sacral region unfortunately. She could not tolerate the ABI's today due to the fact that it calls too much pain when we were performing the blood pressure reading on her lower extremities who were not even able to get a proper reading. No fevers, chills, nausea, or vomiting noted at this time. 05/25/19 on evaluation today patient appears to be doing about the same in regard to her sacral wound maybe slightly better compared to previous. Fortunately there's no signs of active infection at this time which is good news in regard to her lower extremities. She has previously had cellulitis therefore were trying to keep an eye on this as far as that's concerned. Nonetheless since I last saw her which was actually roughly 2 weeks ago since due to work her daughter was unable to bring her last week she is not had any care through home health and has not gotten compression stockings for her bilateral lower extremities. Subsequently the issue with home health seems to be multifactorial when they contacted the patient and her daughter in order to get things scheduled they were told according to the daughter that she didn't know why they needed to come out three times a week and wasn't aware that that was what the order was gonna be. The daughter tells me that that somewhat poor timing for her as she works  and doesn't have a lot of time to hang around at the house waiting for home health. With that being said I explained that the only thing  we can really do is either have home health come out and perform the dressing changes or else potentially have a family member do this. There apparently is no family member including herself who is willing to do this she tells me "that she doesn't feel qualified and comfortable". Nonetheless either way the patient has not had a dressing on the sacral area since the last time she was here she uses a pad hasn't really noted much drainage onto the pad although it does appear that the wound itself trapped a lot of the fluid within skin folds. In the end I do think that since she doesn't really have anyone to perform the dressing changes at home we are going to need to utilize the services of home health. They are in agreement with that plan. 06/01/19 on evaluation today patient presents for follow-up concerning her wound in the midline sacral area. This seems to be doing well there is some evidence of new skin growth there still an open sore however it is gonna require dressing. Home health is coming out to see her I think we do need to see about getting her a better schedule where potentially she comes entire clinic on Monday and then on Wednesday and Friday his home health change in her dressing as it stands right now I feel like this is happening to a regular for her. Fortunately there's no signs of any infection at this time her legs are doing well although she does have the compression stockings finally she doesn't actually have those on as of today. They want [place] to show them how to put them on. 06/08/19 on evaluation today patient came in today without any dressing on her sacral region. Apparently this fell off around Friday and unfortunately it was supposed to be reapplied Friday but the patient had an appointment scheduled for 11 o'clock and her home health nurse was supposed to arrive at 30. She was running late and therefore they called and canceled her coming out to perform the dressing change  and subsequently even though they had dressing material at home her daughter refuses to apply the dressing to the wound. For that reason she went all weekend without any dressing until today. She also has gotten the compression stockings but has just use the Tubigrip on occasion although she doesn't even have that on today. Apparently her daughter stating that the compression stockings are too hard to get on and therefore they're not able to do that. Nonetheless as I stated patient came in with nothing on her lower extremities as far as compression is concerned. They do want to see about extending her visits to every two weeks. GLENDOLA, FRIEDHOFF ([id number]) Electronic Signature(s) Signed: 06/08/2019 9:00:41 PM By: [name] PA-C Entered By: [name] on 06/08/2019 20:24:30 Spayd, Irven [place] ([id number]) -------------------------------------------------------------------------------- Physical Exam Details Patient Name: Delbridge, [name] Date of Service: 06/08/2019 9:30 AM Medical Record Number: [id number] Patient Account Number: [id number] Date of Birth/Sex: [birth year] (85 y.o. F) Treating RN: Montey Hora Primary Care Provider: [name], [name] Other Clinician: Referring Provider: [name], RICHARD Treating Provider/Extender: Melburn Hake, Kilie Rund Weeks in Treatment: 3 Constitutional Well-nourished and well-hydrated in no acute distress. Respiratory normal breathing without difficulty. clear to auscultation bilaterally. Cardiovascular regular rate and rhythm with normal S1, S2. Psychiatric this patient is  able to make decisions and demonstrates good insight into disease process. Alert and Oriented x 3. pleasant and cooperative. Notes Upon inspection today patient's wound bed actually had some Slough noted on the surface of the wound there did not appear to be too much maceration which is good news considering she hasn't had a dressing on for several days. With that being said  there's no signs of active infection at this time which is also good news. No sharp debridement was required at this point. Electronic Signature(s) Signed: 06/08/2019 9:00:41 PM By: [name] PA-C Entered By: [name] on 06/08/2019 20:25:45 Scheffler, Irven [place] ([id number]) -------------------------------------------------------------------------------- Physician Orders Details Patient Name: Alicea, Oneal L. Date of Service: 06/08/2019 9:30 AM Medical Record Number: [id number] Patient Account Number: [id number] Date of Birth/Sex: Apr 12, 1928 (85 y.o. F) Treating RN: Montey Hora Primary Care Provider: [name], [name] Other Clinician: Referring Provider: [name] Treating Provider/Extender: Melburn Hake, Nuria Phebus Weeks in Treatment: 3 Verbal / Phone Orders: No Diagnosis Coding ICD-10 Coding Code Description L89.153 Pressure ulcer of sacral region, stage 3 I89.0 Lymphedema, not elsewhere classified I87.2 Venous insufficiency (chronic) (peripheral) I10 Essential (primary) hypertension J44.9 Chronic obstructive pulmonary disease, unspecified Wound Cleansing Wound #1 Midline Sacrum o Dial antibacterial soap, wash wounds, rinse and pat dry prior to dressing wounds o May Shower, gently pat wound dry prior to applying new dressing. Primary Wound Dressing Wound #1 Midline Sacrum o Silver Alginate Secondary Dressing Wound #1 Midline Sacrum o Boardered Foam Dressing Dressing Change Frequency Wound #1 Midline Sacrum o Change Dressing Monday, Wednesday, Friday Follow-up Appointments Wound #1 Midline Sacrum o Return Appointment in 2 weeks. - [facility] to change bandage 3 times weekly when patient does not come to clinic Edema Control o Patient to wear own compression stockings o Elevate legs to the level of the heart and pump ankles as often as possible Home Health Wound #1 Midline [place] Nurse may visit PRN to  address patientos wound care needs. o FACE TO FACE ENCOUNTER: MEDICARE and MEDICAID PATIENTS: I certify that this patient is under my care and that I had a face-to-face encounter that meets the physician face-to-face encounter requirements with this patient on this date. The encounter with the patient was in whole or in part for the following MEDICAL CONDITION: (primary reason for [place]) MEDICAL NECESSITY: I certify, that based on my findings, RAVENNE, WAYMENT ([id number]) NURSING services are a medically necessary home health service. HOME BOUND STATUS: I certify that my clinical findings support that this patient is homebound (i.e., Due to illness or injury, pt requires aid of supportive devices such as crutches, cane, wheelchairs, walkers, the use of special transportation or the assistance of another person to leave their place of residence. There is a normal inability to leave the home and doing so requires considerable and taxing effort. Other absences are for medical reasons / religious services and are infrequent or of short duration when for other reasons). o If current dressing causes regression in wound condition, may D/C ordered dressing product/s and apply Normal Saline Moist Dressing daily until next [place] / Other MD appointment. [place] of regression in wound condition at [phone]. o Please direct any NON-WOUND related issues/requests for orders to patient's Primary Care Physician Electronic Signature(s) Signed: 06/08/2019 4:46:21 PM By: Montey Hora Signed: 06/08/2019 9:00:41 PM By: [name] PA-C Entered By: Montey Hora on 06/08/2019 09:58:58 Litzau, Joycelynn L. ([id number]) --------------------------------------------------------------------------------  Problem List Details Patient Name: Marrufo, Ohana L. Date of Service: 06/08/2019 9:30 AM Medical Record Number: [id number] Patient Account Number: [id number] Date of  Birth/Sex: 11/14/1927 (85 y.o. F) Treating RN: Montey Hora Primary Care Provider: [name], [name] Other Clinician: Referring Provider: [name], [name] Treating Provider/Extender: Sharalyn Ink in Treatment: 3 Active Problems ICD-10 Evaluated Encounter Code Description Active Date Today Diagnosis L89.153 Pressure ulcer of sacral region, stage 3 05/14/2019 No Yes I89.0 Lymphedema, not elsewhere classified 05/14/2019 No Yes I87.2 Venous insufficiency (chronic) (peripheral) 05/14/2019 No Yes I10 Essential (primary) hypertension 05/14/2019 No Yes J44.9 Chronic obstructive pulmonary disease, unspecified 05/14/2019 No Yes Inactive Problems Resolved Problems Electronic Signature(s) Signed: 06/08/2019 9:40:45 AM By: [name] PA-C Entered By: [name] on 06/08/2019 09:40:45 Ghan, Laraya L. ([id number]) -------------------------------------------------------------------------------- Progress Note Details Patient Name: Kotara, [name] Date of Service: 06/08/2019 9:30 AM Medical Record Number: [id number] Patient Account Number: [id number] Date of Birth/Sex: [birth year] (85 y.o. F) Treating RN: Montey Hora Primary Care Provider: [name], [name] Other Clinician: Referring Provider: [name] Treating Provider/Extender: Sharalyn Ink in Treatment: 3 Subjective Chief Complaint Information obtained from Patient Sacral pressure ulcer and bilateral LE lymphedema History of Present Illness (HPI) 05/14/19 on evaluation today patient appears to be doing rather well in regard to her lower extremities compared to what it sounds like she was doing not that far back. Fortunately there's no signs of active infection at this time although it does sound like she had significant cellulitis earlier in the year. She does also have a issue with a sacral ulcer upon inspection today which has been present since around February 2020 this is about as long as she's been  dealing with her legs as well. She is under palliative care but not hospice. The patient does have a history of scoliosis and kyphosis which makes it difficult for her to lay flat she actually sleep sitting up in a recliner but night and reclined at this cause her trouble breathing. She also has COPD, hypertension, and bilateral lower should be lymphedema and venous stasis. Currently there's no signs of active cellulitis of the bilateral lower extremities which is good news. The patient has not had any vascular intervention nor evaluation that we have on file. She does spend the majority per day apparently sitting in her recliner upright. This I think is an issue both for her legs as well as for the sacral region unfortunately. She could not tolerate the ABI's today due to the fact that it calls too much pain when we were performing the blood pressure reading on her lower extremities who were not even able to get a proper reading. No fevers, chills, nausea, or vomiting noted at this time. 05/25/19 on evaluation today patient appears to be doing about the same in regard to her sacral wound maybe slightly better compared to previous. Fortunately there's no signs of active infection at this time which is good news in regard to her lower extremities. She has previously had cellulitis therefore were trying to keep an eye on this as far as that's concerned. Nonetheless since I last saw her which was actually roughly 2 weeks ago since due to work her daughter was unable to bring her last week she is not had any care through home health and has not gotten compression stockings for her bilateral lower extremities. Subsequently the issue with home health seems to be multifactorial when they contacted the patient and her daughter in order  to get things scheduled they were told according to the daughter that she didn't know why they needed to come out three times a week and wasn't aware that that was what the  order was gonna be. The daughter tells me that that somewhat poor timing for her as she works and doesn't have a lot of time to hang around at the house waiting for home health. With that being said I explained that the only thing we can really do is either have home health come out and perform the dressing changes or else potentially have a family member do this. There apparently is no family member including herself who is willing to do this she tells me "that she doesn't feel qualified and comfortable". Nonetheless either way the patient has not had a dressing on the sacral area since the last time she was here she uses a pad hasn't really noted much drainage onto the pad although it does appear that the wound itself trapped a lot of the fluid within skin folds. In the end I do think that since she doesn't really have anyone to perform the dressing changes at home we are going to need to utilize the services of home health. They are in agreement with that plan. 06/01/19 on evaluation today patient presents for follow-up concerning her wound in the midline sacral area. This seems to be doing well there is some evidence of new skin growth there still an open sore however it is gonna require dressing. Home health is coming out to see her I think we do need to see about getting her a better schedule where potentially she comes entire clinic on Monday and then on Wednesday and Friday his home health change in her dressing as it stands right now I feel like this is happening to a regular for her. Fortunately there's no signs of any infection at this time her legs are doing well although she does have the compression stockings finally she doesn't actually have those on as of today. They want [place] to show them how to put them on. 06/08/19 on evaluation today patient came in today without any dressing on her sacral region. Apparently this fell off around Friday and unfortunately it was supposed to be reapplied  Friday but the patient had an appointment scheduled for 11 o'clock and her home health nurse was supposed to arrive at 17. She was running late and therefore they called and canceled her JANAVIA, ROTTMAN. ([id number]) coming out to perform the dressing change and subsequently even though they had dressing material at home her daughter refuses to apply the dressing to the wound. For that reason she went all weekend without any dressing until today. She also has gotten the compression stockings but has just use the Tubigrip on occasion although she doesn't even have that on today. Apparently her daughter stating that the compression stockings are too hard to get on and therefore they're not able to do that. Nonetheless as I stated patient came in with nothing on her lower extremities as far as compression is concerned. They do want to see about extending her visits to every two weeks. Patient History Information obtained from Patient. Family History Cancer - Child, Diabetes - Siblings, Hypertension - Siblings, Stroke - Mother, No family history of Heart Disease, Kidney Disease, Lung Disease, Seizures, Thyroid Problems, Tuberculosis. Social History Never smoker, Marital Status - Widowed, Alcohol Use - Rarely, Caffeine Use - Daily. Medical History Respiratory Patient has history of Chronic  Obstructive Pulmonary Disease (COPD) Integumentary (Skin) Denies history of History of pressure wounds Musculoskeletal Patient has history of Osteoarthritis - hands, knees, sholders Oncologic Denies history of Received Chemotherapy, Received Radiation Review of Systems (ROS) Constitutional Symptoms (General Health) Denies complaints or symptoms of Fatigue, Fever, Chills, Marked Weight Change. Respiratory Denies complaints or symptoms of Chronic or frequent coughs, Shortness of Breath. Cardiovascular Denies complaints or symptoms of Chest pain, LE edema. Psychiatric Denies complaints or symptoms of  Anxiety, Claustrophobia. Objective Constitutional Well-nourished and well-hydrated in no acute distress. Vitals Time Taken: 9:42 AM, Height: 60 in, Weight: 130 lbs, BMI: 25.4, Temperature: 98.5 F, Pulse: 110 bpm, Respiratory Rate: 18 breaths/min, Blood Pressure: 137/80 mmHg. Respiratory normal breathing without difficulty. clear to auscultation bilaterally. Cardiovascular Wint, [place] ([id number]) regular rate and rhythm with normal S1, S2. Psychiatric this patient is able to make decisions and demonstrates good insight into disease process. Alert and Oriented x 3. pleasant and cooperative. General Notes: Upon inspection today patient's wound bed actually had some Slough noted on the surface of the wound there did not appear to be too much maceration which is good news considering she hasn't had a dressing on for several days. With that being said there's no signs of active infection at this time which is also good news. No sharp debridement was required at this point. Integumentary (Hair, Skin) Wound #1 status is Open. Original cause of wound was Gradually Appeared. The wound is located on the Midline Sacrum. The wound measures 2cm length x 0.8cm width x 0.6cm depth; 1.257cm^2 area and 0.754cm^3 volume. There is Fat Layer (Subcutaneous Tissue) Exposed exposed. There is no tunneling or undermining noted. There is a small amount of serous drainage noted. The wound margin is epibole. There is large (67-100%) pink granulation within the wound bed. There is a small (1-33%) amount of necrotic tissue within the wound bed including Adherent Slough. Assessment Active Problems ICD-10 Pressure ulcer of sacral region, stage 3 Lymphedema, not elsewhere classified Venous insufficiency (chronic) (peripheral) Essential (primary) hypertension Chronic obstructive pulmonary disease, unspecified Plan Wound Cleansing: Wound #1 Midline Sacrum: Dial antibacterial soap, wash wounds, rinse and pat  dry prior to dressing wounds May Shower, gently pat wound dry prior to applying new dressing. Primary Wound Dressing: Wound #1 Midline Sacrum: Silver Alginate Secondary Dressing: Wound #1 Midline Sacrum: Boardered Foam Dressing Dressing Change Frequency: Wound #1 Midline Sacrum: Change Dressing Monday, Wednesday, Friday Follow-up Appointments: Wound #1 Midline Sacrum: Return Appointment in 2 weeks. - [facility] to change bandage 3 times weekly when patient does not come to clinic Edema Control: Patient to wear own compression stockings Tyler, Sanaz L. ([id number]) Elevate legs to the level of the heart and pump ankles as often as possible Home Health: Wound #1 Midline Sacrum: [place] Nurse may visit PRN to address patient s wound care needs. FACE TO FACE ENCOUNTER: MEDICARE and MEDICAID PATIENTS: I certify that this patient is under my care and that I had a face-to-face encounter that meets the physician face-to-face encounter requirements with this patient on this date. The encounter with the patient was in whole or in part for the following MEDICAL CONDITION: (primary reason for [place]) MEDICAL NECESSITY: I certify, that based on my findings, NURSING services are a medically necessary home health service. HOME BOUND STATUS: I certify that my clinical findings support that this patient is homebound (i.e., Due to illness or injury, pt requires aid of supportive devices such as crutches, cane, wheelchairs, walkers, the  use of special transportation or the assistance of another person to leave their place of residence. There is a normal inability to leave the home and doing so requires considerable and taxing effort. Other absences are for medical reasons / religious services and are infrequent or of short duration when for other reasons). If current dressing causes regression in wound condition, may D/C ordered dressing product/s and apply Normal  Saline Moist Dressing daily until next [place] / Other MD appointment. [place] of regression in wound condition at [phone]. Please direct any NON-WOUND related issues/requests for orders to patient's Primary Care Physician 1. At this point I do believe the surviving the dressing is the best thing for the patient. With that being said the patient has to keep the stress and on and again we can have home health come out to do this but they have to make sure to be home when home health is able to ride as well. 2. Patient's lower extremities really needs to have compression although it appears that her daughter is not able to get these on her duty to help tight they are occasionally she seems to be wearing the Tubigrip according to what her daughter tells me but again she did not even have that upon coming into the office today. 3. Lastly I do believe the patient needs to continued offload and that means sitting less and taking pressure off of the area. With that being said that is also an issue in this case that she states that she's really not able to get into many other positions other than just sitting in her chair. Obviously that is not good for this region as far as healing is concerned with all that being said she is at least making some progress. Please see above for specific wound care orders. We will see patient for re-evaluation in 1 week(s) here in the clinic. If anything worsens or changes patient will contact our office for additional recommendations. Electronic Signature(s) Signed: 06/08/2019 9:00:41 PM By: [name] PA-C Entered By: [name] on 06/08/2019 20:28:18 Gick, Othel Carlean Jews ([id number]) -------------------------------------------------------------------------------- ROS/PFSH Details Patient Name: Nevares, [name] Date of Service: 06/08/2019 9:30 AM Medical Record Number: [id number] Patient Account Number: [id number] Date of  Birth/Sex: 1928/07/05 (85 y.o. F) Treating RN: Montey Hora Primary Care Provider: [name], [name] Other Clinician: Referring Provider: [name] Treating Provider/Extender: Melburn Hake, Lilinoe Acklin Weeks in Treatment: 3 Information Obtained From Patient Constitutional Symptoms (General Health) Complaints and Symptoms: Negative for: Fatigue; Fever; Chills; Marked Weight Change Respiratory Complaints and Symptoms: Negative for: Chronic or frequent coughs; Shortness of Breath Medical History: Positive for: Chronic Obstructive Pulmonary Disease (COPD) Cardiovascular Complaints and Symptoms: Negative for: Chest pain; LE edema Psychiatric Complaints and Symptoms: Negative for: Anxiety; Claustrophobia Integumentary (Skin) Medical History: Negative for: History of pressure wounds Musculoskeletal Medical History: Positive for: Osteoarthritis - hands, knees, sholders Oncologic Medical History: Negative for: Received Chemotherapy; Received Radiation Immunizations Pneumococcal Vaccine: Received Pneumococcal Vaccination: Yes Implantable Devices None Family and Social History DOLLIE, BRESSI ([id number]) Cancer: Yes - Child; Diabetes: Yes - Siblings; Heart Disease: No; Hypertension: Yes - Siblings; Kidney Disease: No; Lung Disease: No; Seizures: No; Stroke: Yes - Mother; Thyroid Problems: No; Tuberculosis: No; Never smoker; Marital Status - Widowed; Alcohol Use: Rarely; Caffeine Use: Daily Physician Affirmation I have reviewed and agree with the above information. Electronic Signature(s) Signed: 06/08/2019 9:00:41 PM By: [name] PA-C Signed: 06/10/2019 4:34:48 PM By: Montey Hora  Entered By: [name] on 06/08/2019 20:25:26 Sealy, Johnie Carlean Jews ([id number]) -------------------------------------------------------------------------------- SuperBill Details Patient Name: Cazarez, [name] Date of Service: 06/08/2019 Medical Record Number: [id number] Patient Account  Number: [id number] Date of Birth/Sex: 1928/03/02 (85 y.o. F) Treating RN: Montey Hora Primary Care Provider: [name], [name] Other Clinician: Referring Provider: [name] Treating Provider/Extender: Melburn Hake, Dilana Mcphie Weeks in Treatment: 3 Diagnosis Coding ICD-10 Codes Code Description L89.153 Pressure ulcer of sacral region, stage 3 I89.0 Lymphedema, not elsewhere classified I87.2 Venous insufficiency (chronic) (peripheral) I10 Essential (primary) hypertension J44.9 Chronic obstructive pulmonary disease, unspecified Facility Procedures CPT4 Code: 84166063 Description: 01601 - WOUND CARE VISIT-LEV 3 EST PT Modifier: Quantity: 1 Physician Procedures CPT4 Code: 0932355 Description: 73220 - WC PHYS LEVEL 4 - EST PT ICD-10 Diagnosis Description L89.153 Pressure ulcer of sacral region, stage 3 I89.0 Lymphedema, not elsewhere classified I87.2 Venous insufficiency (chronic) (peripheral) I10 Essential (primary) hypertension Modifier: Quantity: 1 Electronic Signature(s) Signed: 06/08/2019 9:00:41 PM By: [name] PA-C Entered By: [name] on 06/08/2019 20:28:31

## 2019-06-08 NOTE — Progress Notes (Addendum)
Joyce, [name] ([id number]) Visit Report for 06/08/2019 Arrival Information Details Patient Name: Joyce [name], [name] Date of Service: 06/08/2019 9:30 AM Medical Record Number: [id number] Patient Account Number: [id number] Date of Birth/Sex: December 31, 1927 (85 y.o. F) Treating RN: Army Melia Primary Care Deago Burruss: [name], [name] Other Clinician: Referring Lamine Laton: [name] Treating Milas Schappell/Extender: Melburn Hake, HOYT Weeks in Treatment: 3 Visit Information History Since Last Visit Added or deleted any medications: No Patient Arrived: Ambulatory Any new allergies or adverse reactions: No Arrival Time: 09:42 Had a fall or experienced change in No Accompanied By: daughter activities of daily living that may affect Transfer Assistance: None risk of falls: Patient Requires Transmission-Based No Signs or symptoms of abuse/neglect since last visito No Precautions: Hospitalized since last visit: No Patient Has Alerts: Yes Has Dressing in Place as Prescribed: Yes Patient Alerts: Not Pain Present Now: No Diabetic Electronic Signature(s) Signed: 06/08/2019 1:06:05 PM By: Army Melia Entered By: Army Melia on 06/08/2019 09:42:24 Sharrow, Katelyn L. ([id number]) -------------------------------------------------------------------------------- Clinic Level of Care Assessment Details Patient Name: Joyce, [name] Date of Service: 06/08/2019 9:30 AM Medical Record Number: [id number] Patient Account Number: [id number] Date of Birth/Sex: 28-Oct-1928 (85 y.o. F) Treating RN: Montey Hora Primary Care Haron Beilke: [name], [name] Other Clinician: Referring Irania Durell: [name] Treating Xander Jutras/Extender: Melburn Hake, HOYT Weeks in Treatment: 3 Clinic Level of Care Assessment Items TOOL 4 Quantity Score []  - Use when only an EandM is performed on FOLLOW-UP visit 0 ASSESSMENTS - Nursing Assessment / Reassessment X - Reassessment of Co-morbidities (includes updates in patient  status) 1 10 X- 1 5 Reassessment of Adherence to Treatment Plan ASSESSMENTS - Wound and Skin Assessment / Reassessment X - Simple Wound Assessment / Reassessment - one wound 1 5 []  - 0 Complex Wound Assessment / Reassessment - multiple wounds []  - 0 Dermatologic / Skin Assessment (not related to wound area) ASSESSMENTS - Focused Assessment []  - Circumferential Edema Measurements - multi extremities 0 []  - 0 Nutritional Assessment / Counseling / Intervention []  - 0 Lower Extremity Assessment (monofilament, tuning fork, pulses) []  - 0 Peripheral Arterial Disease Assessment (using hand held doppler) ASSESSMENTS - Ostomy and/or Continence Assessment and Care []  - Incontinence Assessment and Management 0 []  - 0 Ostomy Care Assessment and Management (repouching, etc.) PROCESS - Coordination of Care X - Simple Patient / Family Education for ongoing care 1 15 []  - 0 Complex (extensive) Patient / Family Education for ongoing care X- 1 10 Staff obtains Programmer, systems, Records, Test Results / Process Orders []  - 0 Staff telephones HHA, Nursing Homes / Clarify orders / etc []  - 0 Routine Transfer to another Facility (non-emergent condition) []  - 0 Routine Hospital Admission (non-emergent condition) []  - 0 New Admissions / Biomedical engineer / Ordering NPWT, Apligraf, etc. []  - 0 Emergency Hospital Admission (emergent condition) X- 1 10 Simple Discharge Coordination [name], [name] ([id number]) []  - 0 Complex (extensive) Discharge Coordination PROCESS - Special Needs []  - Pediatric / Minor Patient Management 0 []  - 0 Isolation Patient Management []  - 0 Hearing / Language / Visual special needs []  - 0 Assessment of Community assistance (transportation, D/C planning, etc.) []  - 0 Additional assistance / Altered mentation []  - 0 Support Surface(s) Assessment (bed, cushion, seat, etc.) INTERVENTIONS - Wound Cleansing / Measurement X - Simple Wound Cleansing - one wound 1 5 []  -  0 Complex Wound Cleansing - multiple wounds X- 1 5 Wound Imaging (photographs - any number of wounds) []  - 0 Wound Tracing (  instead of photographs) X- 1 5 Simple Wound Measurement - one wound []  - 0 Complex Wound Measurement - multiple wounds INTERVENTIONS - Wound Dressings X - Small Wound Dressing one or multiple wounds 1 10 []  - 0 Medium Wound Dressing one or multiple wounds []  - 0 Large Wound Dressing one or multiple wounds []  - 0 Application of Medications - topical []  - 0 Application of Medications - injection INTERVENTIONS - Miscellaneous []  - External ear exam 0 []  - 0 Specimen Collection (cultures, biopsies, blood, body fluids, etc.) []  - 0 Specimen(s) / Culture(s) sent or taken to Lab for analysis []  - 0 Patient Transfer (multiple staff / Civil Service fast streamer / Similar devices) []  - 0 Simple Staple / Suture removal (25 or less) []  - 0 Complex Staple / Suture removal (26 or more) []  - 0 Hypo / Hyperglycemic Management (close monitor of Blood Glucose) []  - 0 Ankle / Brachial Index (ABI) - do not check if billed separately X- 1 5 Vital Signs [name], [name] ([id number]) Has the patient been seen at the hospital within the last three years: Yes Total Score: 85 Level Of Care: New/Established - Level 3 Electronic Signature(s) Signed: 06/08/2019 4:46:21 PM By: Montey Hora Entered By: Montey Hora on 06/08/2019 09:59:36 Sandra, Lativia L. ([id number]) -------------------------------------------------------------------------------- Lower Extremity Assessment Details Patient Name: Joyce, [name] Date of Service: 06/08/2019 9:30 AM Medical Record Number: [id number] Patient Account Number: [id number] Date of Birth/Sex: 1928-08-10 (85 y.o. F) Treating RN: Army Melia Primary Care Fread Kottke: [name] Other Clinician: Referring Lan Mcneill: [name], RICHARD Treating Gabbie Marzo/Extender: Melburn Hake, HOYT Weeks in Treatment: 3 Electronic Signature(s) Signed: 06/08/2019  1:06:05 PM By: Army Melia Entered By: Army Melia on 06/08/2019 [place], [place] ([id number]) -------------------------------------------------------------------------------- Multi Wound Chart Details Patient Name: Rodenbeck, Cadi L. Date of Service: 06/08/2019 9:30 AM Medical Record Number: [id number] Patient Account Number: [id number] Date of Birth/Sex: 10/24/1928 (85 y.o. F) Treating RN: Montey Hora Primary Care Lucas Winograd: [name], [name] Other Clinician: Referring Jasiah Buntin: [name], [name] Treating Wiliam Cauthorn/Extender: Melburn Hake, HOYT Weeks in Treatment: 3 Vital Signs Height(in): 60 Pulse(bpm): 110 Weight(lbs): 130 Blood Pressure(mmHg): 137/80 Body Mass Index(BMI): 25 Temperature(F): 98.5 Respiratory Rate 18 (breaths/min): Photos: [N/A:N/A] Wound Location: Sacrum - Midline N/A N/A Wounding Event: Gradually Appeared N/A N/A Primary Etiology: Pressure Ulcer N/A N/A Comorbid History: Chronic Obstructive N/A N/A Pulmonary Disease (COPD), Osteoarthritis Date Acquired: 12/28/2018 N/A N/A Weeks of Treatment: 3 N/A N/A Wound Status: Open N/A N/A Measurements L x W x D 2x0.8x0.6 N/A N/A (cm) Area (cm) : 1.257 N/A N/A Volume (cm) : 0.754 N/A N/A % Reduction in Area: -3.90% N/A N/A % Reduction in Volume: -211.60% N/A N/A Classification: Category/Stage III N/A N/A Exudate Amount: Small N/A N/A Exudate Type: Serous N/A N/A Exudate Color: amber N/A N/A Wound Margin: Epibole N/A N/A Granulation Amount: Large (67-100%) N/A N/A Granulation Quality: Pink N/A N/A Necrotic Amount: Small (1-33%) N/A N/A Exposed Structures: Fat Layer (Subcutaneous N/A N/A Tissue) Exposed: Yes Fascia: No Tendon: No Muscle: No Eagleton, Majorie L. ([id number]) Joint: No Bone: No Epithelialization: None N/A N/A Treatment Notes Electronic Signature(s) Signed: 06/08/2019 4:46:21 PM By: Montey Hora Entered By: Montey Hora on 06/08/2019 09:56:18 Dass, Leanette L.  ([id number]) -------------------------------------------------------------------------------- [place] Details Patient Name: Vandervoort, Shaneca L. Date of Service: 06/08/2019 9:30 AM Medical Record Number: [id number] Patient Account Number: [id number] Date of Birth/Sex: Nov 01, 1928 (85 y.o. F) Treating RN: Montey Hora Primary Care Rogena Deupree: [name], [name] Other Clinician: Referring Yuki Brunsman: [name], [name]  Treating Arneshia Ade/Extender: STONE III, HOYT Weeks in Treatment: 3 Active Inactive Abuse / Safety / Falls / Self Care Management Nursing Diagnoses: Potential for falls Goals: Patient will not experience any injury related to falls Date Initiated: 05/14/2019 Target Resolution Date: 08/14/2019 Goal Status: Active Interventions: Assess fall risk on admission and as needed Notes: Orientation to the Wound Care Program Nursing Diagnoses: Knowledge deficit related to the wound healing center program Goals: Patient/caregiver will verbalize understanding of the [facility] Program Date Initiated: 05/14/2019 Target Resolution Date: 08/14/2019 Goal Status: Active Interventions: Provide education on orientation to the wound center Notes: Pressure Nursing Diagnoses: Potential for impaired tissue integrity related to pressure, friction, moisture, and shear Goals: Patient will remain free from development of additional pressure ulcers Date Initiated: 05/14/2019 Target Resolution Date: 08/14/2019 Goal Status: Active Interventions: Provide education on pressure ulcers Lemaire, Mattingly L. ([id number]) Notes: Wound/Skin Impairment Nursing Diagnoses: Impaired tissue integrity Goals: Ulcer/skin breakdown will heal within 14 weeks Date Initiated: 05/14/2019 Target Resolution Date: 08/14/2019 Goal Status: Active Interventions: Assess patient/caregiver ability to obtain necessary supplies Assess patient/caregiver ability to perform ulcer/skin care regimen upon  admission and as needed Assess ulceration(s) every visit Notes: Electronic Signature(s) Signed: 06/08/2019 4:46:21 PM By: Montey Hora Entered By: Montey Hora on 06/08/2019 09:56:11 Vanderlinde, Margorie L. ([id number]) -------------------------------------------------------------------------------- Pain Assessment Details Patient Name: Acuna, Bettyann L. Date of Service: 06/08/2019 9:30 AM Medical Record Number: [id number] Patient Account Number: [id number] Date of Birth/Sex: 1928-01-31 (85 y.o. F) Treating RN: Army Melia Primary Care Yves Fodor: [name] Other Clinician: Referring Leontae Bostock: [name] Treating Masai Kidd/Extender: Melburn Hake, HOYT Weeks in Treatment: 3 Active Problems Location of Pain Severity and Description of Pain Patient Has Paino No Site Locations Pain Management and Medication Current Pain Management: Electronic Signature(s) Signed: 06/08/2019 1:06:05 PM By: Army Melia Entered By: Army Melia on 06/08/2019 09:42:29 Monrreal, Alisse L. ([id number]) -------------------------------------------------------------------------------- Patient/Caregiver Education Details Patient Name: Bonneville, Madilynn L. Date of Service: 06/08/2019 9:30 AM Medical Record Number: [id number] Patient Account Number: [id number] Date of Birth/Gender: 1928/05/01 (85 y.o. F) Treating RN: Montey Hora Primary Care Physician: [name], [name] Other Clinician: Referring Physician: [name] Treating Physician/Extender: Sharalyn Ink in Treatment: 3 Education Assessment Education Provided To: Patient and Caregiver Education Topics Provided Wound/Skin Impairment: Handouts: Other: wound care as ordered Methods: Demonstration, Explain/Verbal Responses: State content correctly Electronic Signature(s) Signed: 06/08/2019 4:46:21 PM By: Montey Hora Entered By: Montey Hora on 06/08/2019 09:59:55 Mohamud, Lequita L.  ([id number]) -------------------------------------------------------------------------------- Wound Assessment Details Patient Name: Rauda, Tomeeka L. Date of Service: 06/08/2019 9:30 AM Medical Record Number: [id number] Patient Account Number: [id number] Date of Birth/Sex: 21-Jun-1928 (85 y.o. F) Treating RN: Army Melia Primary Care Fredie Majano: [name], [name] Other Clinician: Referring Eilidh Marcano: [name] Treating Ibn Stief/Extender: Melburn Hake, HOYT Weeks in Treatment: 3 Wound Status Wound Number: 1 Primary Pressure Ulcer Etiology: Wound Location: Sacrum - Midline Wound Status: Open Wounding Event: Gradually Appeared Comorbid Chronic Obstructive Pulmonary Disease Date Acquired: 12/28/2018 History: (COPD), Osteoarthritis Weeks Of Treatment: 3 Clustered Wound: No Photos Wound Measurements Length: (cm) 2 % Reduction i Width: (cm) 0.8 % Reduction i Depth: (cm) 0.6 Epithelializa Area: (cm) 1.257 Tunneling: Volume: (cm) 0.754 Undermining: n Area: -3.9% n Volume: -211.6% tion: None No No Wound Description Classification: Category/Stage III Foul Odor Aft Wound Margin: Epibole Slough/Fibrin Exudate Amount: Small Exudate Type: Serous Exudate Color: amber er Cleansing: No o No Wound Bed Granulation Amount: Large (67-100%) Exposed Structure Granulation Quality: Pink Fascia Exposed: No Necrotic Amount: Small (1-33%)  Fat Layer (Subcutaneous Tissue) Exposed: Yes Necrotic Quality: Adherent Slough Tendon Exposed: No Muscle Exposed: No Joint Exposed: No Bone Exposed: No Electronic Signature(s) Bevis, Citlalli L. ([id number]) Signed: 06/08/2019 1:06:05 PM By: Army Melia Entered By: Army Melia on 06/08/2019 09:45:44 [place], [place] ([id number]) -------------------------------------------------------------------------------- [place] Details Patient Name: Galindo, Cionna L. Date of Service: 06/08/2019 9:30 AM Medical Record Number: [id number] Patient Account Number:  [id number] Date of Birth/Sex: [birth year] (85 y.o. F) Treating RN: Army Melia Primary Care Stephen Baruch: [name], [name] Other Clinician: Referring Franziska Podgurski: [name] Treating Nasira Janusz/Extender: Melburn Hake, HOYT Weeks in Treatment: 3 Vital Signs Time Taken: 09:42 Temperature (F): 98.5 Height (in): 60 Pulse (bpm): 110 Weight (lbs): 130 Respiratory Rate (breaths/min): 18 Body Mass Index (BMI): 25.4 Blood Pressure (mmHg): 137/80 Reference Range: 80 - 120 mg / dl Electronic Signature(s) Signed: 06/08/2019 1:06:05 PM By: Army Melia Entered By: Army Melia on 06/08/2019 09:42:47

## 2019-06-10 DIAGNOSIS — I890 Lymphedema, not elsewhere classified: Secondary | ICD-10-CM | POA: Diagnosis not present

## 2019-06-10 DIAGNOSIS — J449 Chronic obstructive pulmonary disease, unspecified: Secondary | ICD-10-CM | POA: Diagnosis not present

## 2019-06-10 DIAGNOSIS — L89153 Pressure ulcer of sacral region, stage 3: Secondary | ICD-10-CM | POA: Diagnosis not present

## 2019-06-10 DIAGNOSIS — I872 Venous insufficiency (chronic) (peripheral): Secondary | ICD-10-CM | POA: Diagnosis not present

## 2019-06-10 DIAGNOSIS — M170 Bilateral primary osteoarthritis of knee: Secondary | ICD-10-CM | POA: Diagnosis not present

## 2019-06-10 DIAGNOSIS — I10 Essential (primary) hypertension: Secondary | ICD-10-CM | POA: Diagnosis not present

## 2019-06-15 ENCOUNTER — Ambulatory Visit: Payer: Medicare Other | Admitting: Family Medicine

## 2019-06-18 ENCOUNTER — Telehealth: Payer: Self-pay | Admitting: Family Medicine

## 2019-06-18 DIAGNOSIS — Z20828 Contact with and (suspected) exposure to other viral communicable diseases: Secondary | ICD-10-CM

## 2019-06-18 DIAGNOSIS — Z20822 Contact with and (suspected) exposure to covid-19: Secondary | ICD-10-CM

## 2019-06-18 NOTE — Telephone Encounter (Signed)
Patient daughter advised

## 2019-06-18 NOTE — Telephone Encounter (Signed)
Pt's caregiver has tested positive for Covid in the last 4 days.  Needing to know if she needs to be tested or advise what she needs to do.  Please call [name] back at [phone]  Thanks, [facility]

## 2019-06-18 NOTE — Telephone Encounter (Signed)
Per Dr. Rosanna Randy, test due to exposure.

## 2019-06-19 ENCOUNTER — Other Ambulatory Visit: Payer: Self-pay

## 2019-06-19 DIAGNOSIS — Z20822 Contact with and (suspected) exposure to covid-19: Secondary | ICD-10-CM

## 2019-06-20 LAB — NOVEL CORONAVIRUS, NAA: SARS-CoV-2, NAA: NOT DETECTED

## 2019-06-21 ENCOUNTER — Ambulatory Visit: Payer: Medicare Other | Admitting: Physician Assistant

## 2019-06-24 ENCOUNTER — Other Ambulatory Visit: Payer: Self-pay

## 2019-06-24 DIAGNOSIS — R6889 Other general symptoms and signs: Secondary | ICD-10-CM | POA: Diagnosis not present

## 2019-06-24 DIAGNOSIS — Z20822 Contact with and (suspected) exposure to covid-19: Secondary | ICD-10-CM

## 2019-06-25 LAB — NOVEL CORONAVIRUS, NAA: SARS-CoV-2, NAA: NOT DETECTED

## 2019-07-02 ENCOUNTER — Encounter: Payer: Medicare Other | Admitting: Physician Assistant

## 2019-07-02 ENCOUNTER — Other Ambulatory Visit: Payer: Self-pay

## 2019-07-02 DIAGNOSIS — L89153 Pressure ulcer of sacral region, stage 3: Secondary | ICD-10-CM | POA: Diagnosis not present

## 2019-07-02 DIAGNOSIS — L89159 Pressure ulcer of sacral region, unspecified stage: Secondary | ICD-10-CM | POA: Diagnosis not present

## 2019-07-02 DIAGNOSIS — I10 Essential (primary) hypertension: Secondary | ICD-10-CM | POA: Diagnosis not present

## 2019-07-02 DIAGNOSIS — J449 Chronic obstructive pulmonary disease, unspecified: Secondary | ICD-10-CM | POA: Diagnosis not present

## 2019-07-02 DIAGNOSIS — I890 Lymphedema, not elsewhere classified: Secondary | ICD-10-CM | POA: Diagnosis not present

## 2019-07-02 DIAGNOSIS — I872 Venous insufficiency (chronic) (peripheral): Secondary | ICD-10-CM | POA: Diagnosis not present

## 2019-07-02 NOTE — Progress Notes (Addendum)
Joyce [name] ([id number]) Visit Report for 07/02/2019 Arrival Information Details Patient Name: Joyce [name], [name] Date of Service: 07/02/2019 2:45 PM Medical Record Number: [id number] Patient Account Number: [id number] Date of Birth/Sex: [birth year] (85 y.o. F) Treating RN: Army Melia Primary Care Keyen Marban: [name] Other Clinician: Referring Travis Purk: [name] Treating Deuntae Kocsis/Extender: Melburn Hake, HOYT Weeks in Treatment: 7 Visit Information History Since Last Visit Added or deleted any medications: No Patient Arrived: Walker Any new allergies or adverse reactions: No Arrival Time: 15:05 Had a fall or experienced change in No Accompanied By: daughter activities of daily living that may affect Transfer Assistance: None risk of falls: Patient Identification Verified: Yes Signs or symptoms of abuse/neglect since last visito No Secondary Verification Process Completed: Yes Hospitalized since last visit: No Patient Requires Transmission-Based No Has Dressing in Place as Prescribed: Yes Precautions: Pain Present Now: No Patient Has Alerts: Yes Patient Alerts: Not Diabetic Electronic Signature(s) Signed: 07/02/2019 3:12:16 PM By: Army Melia Entered By: Army Melia on 07/02/2019 15:06:13 Modesitt, Geovana L. ([id number]) -------------------------------------------------------------------------------- Clinic Level of Care Assessment Details Patient Name: [name], [name] Date of Service: 07/02/2019 2:45 PM Medical Record Number: [id number] Patient Account Number: [id number] Date of Birth/Sex: 06-May-1928 (85 y.o. F) Treating RN: Montey Hora Primary Care Reynald Woods: [name], [name] Other Clinician: Referring Mouhamed Glassco: [name] Treating Gurshan Settlemire/Extender: Melburn Hake, HOYT Weeks in Treatment: 7 Clinic Level of Care Assessment Items TOOL 4 Quantity Score []  - Use when only an EandM is performed on FOLLOW-UP visit 0 ASSESSMENTS - Nursing Assessment  / Reassessment X - Reassessment of Co-morbidities (includes updates in patient status) 1 10 X- 1 5 Reassessment of Adherence to Treatment Plan ASSESSMENTS - Wound and Skin Assessment / Reassessment X - Simple Wound Assessment / Reassessment - one wound 1 5 []  - 0 Complex Wound Assessment / Reassessment - multiple wounds []  - 0 Dermatologic / Skin Assessment (not related to wound area) ASSESSMENTS - Focused Assessment []  - Circumferential Edema Measurements - multi extremities 0 []  - 0 Nutritional Assessment / Counseling / Intervention []  - 0 Lower Extremity Assessment (monofilament, tuning fork, pulses) []  - 0 Peripheral Arterial Disease Assessment (using hand held doppler) ASSESSMENTS - Ostomy and/or Continence Assessment and Care []  - Incontinence Assessment and Management 0 []  - 0 Ostomy Care Assessment and Management (repouching, etc.) PROCESS - Coordination of Care X - Simple Patient / Family Education for ongoing care 1 15 []  - 0 Complex (extensive) Patient / Family Education for ongoing care X- 1 10 Staff obtains Programmer, systems, Records, Test Results / Process Orders []  - 0 Staff telephones HHA, Nursing Homes / Clarify orders / etc []  - 0 Routine Transfer to another Facility (non-emergent condition) []  - 0 Routine Hospital Admission (non-emergent condition) []  - 0 New Admissions / Biomedical engineer / Ordering NPWT, Apligraf, etc. []  - 0 Emergency Hospital Admission (emergent condition) X- 1 10 Simple Discharge Coordination Meyers, Ashle L. ([id number]) []  - 0 Complex (extensive) Discharge Coordination PROCESS - Special Needs []  - Pediatric / Minor Patient Management 0 []  - 0 Isolation Patient Management []  - 0 Hearing / Language / Visual special needs []  - 0 Assessment of Community assistance (transportation, D/C planning, etc.) []  - 0 Additional assistance / Altered mentation []  - 0 Support Surface(s) Assessment (bed, cushion, seat, etc.) INTERVENTIONS -  Wound Cleansing / Measurement X - Simple Wound Cleansing - one wound 1 5 []  - 0 Complex Wound Cleansing - multiple wounds X- 1 5 Wound Imaging (photographs -  any number of wounds) []  - 0 Wound Tracing (instead of photographs) X- 1 5 Simple Wound Measurement - one wound []  - 0 Complex Wound Measurement - multiple wounds INTERVENTIONS - Wound Dressings X - Small Wound Dressing one or multiple wounds 1 10 []  - 0 Medium Wound Dressing one or multiple wounds []  - 0 Large Wound Dressing one or multiple wounds []  - 0 Application of Medications - topical []  - 0 Application of Medications - injection INTERVENTIONS - Miscellaneous []  - External ear exam 0 []  - 0 Specimen Collection (cultures, biopsies, blood, body fluids, etc.) []  - 0 Specimen(s) / Culture(s) sent or taken to Lab for analysis []  - 0 Patient Transfer (multiple staff / Civil Service fast streamer / Similar devices) []  - 0 Simple Staple / Suture removal (25 or less) []  - 0 Complex Staple / Suture removal (26 or more) []  - 0 Hypo / Hyperglycemic Management (close monitor of Blood Glucose) []  - 0 Ankle / Brachial Index (ABI) - do not check if billed separately X- 1 5 Vital Signs Sneed, Carma L. ([id number]) Has the patient been seen at the hospital within the last three years: Yes Total Score: 85 Level Of Care: New/Established - Level 3 Electronic Signature(s) Signed: 07/02/2019 4:02:04 PM By: Montey Hora Entered By: Montey Hora on 07/02/2019 15:41:30 Campise, Raelee L. ([id number]) -------------------------------------------------------------------------------- Encounter Discharge Information Details Patient Name: [name], [name] Date of Service: 07/02/2019 2:45 PM Medical Record Number: [id number] Patient Account Number: [id number] Date of Birth/Sex: 23-Jun-1928 (85 y.o. F) Treating RN: Montey Hora Primary Care Raidon Swanner: [name], [name] Other Clinician: Referring Suha Schoenbeck: [name] Treating  Bradrick Kamau/Extender: Sharalyn Ink in Treatment: 7 Encounter Discharge Information Items Discharge Condition: Stable Ambulatory Status: Walker Discharge Destination: Home Transportation: Private Auto Accompanied By: daughter Schedule Follow-up Appointment: Yes Clinical Summary of Care: Electronic Signature(s) Signed: 07/02/2019 4:02:04 PM By: Montey Hora Entered By: Montey Hora on 07/02/2019 15:44:16 Salls, Dede L. ([id number]) -------------------------------------------------------------------------------- Lower Extremity Assessment Details Patient Name: Sachs, Tessie L. Date of Service: 07/02/2019 2:45 PM Medical Record Number: [id number] Patient Account Number: [id number] Date of Birth/Sex: 10-02-1928 (85 y.o. F) Treating RN: Army Melia Primary Care Wilferd Ritson: [name] Other Clinician: Referring Berlynn Warsame: [name], RICHARD Treating Tashala Cumbo/Extender: Melburn Hake, HOYT Weeks in Treatment: 7 Electronic Signature(s) Signed: 07/02/2019 3:12:16 PM By: Army Melia Entered By: Army Melia on 07/02/2019 15:10:29 Wysong, Aariyah L. ([id number]) -------------------------------------------------------------------------------- Multi Wound Chart Details Patient Name: Mccarver, Jaileigh L. Date of Service: 07/02/2019 2:45 PM Medical Record Number: [id number] Patient Account Number: [id number] Date of Birth/Sex: 06-21-1928 (85 y.o. F) Treating RN: Montey Hora Primary Care Granger Chui: [name], [name] Other Clinician: Referring Courvoisier Hamblen: [name], [name] Treating Eudell Julian/Extender: Melburn Hake, HOYT Weeks in Treatment: 7 Vital Signs Height(in): 60 Pulse(bpm): 108 Weight(lbs): 130 Blood Pressure(mmHg): 136/69 Body Mass Index(BMI): 25 Temperature(F): 99.0 Respiratory Rate 16 (breaths/min): Photos: [N/A:N/A] Wound Location: Sacrum - Midline N/A N/A Wounding Event: Gradually Appeared N/A N/A Primary Etiology: Pressure Ulcer N/A N/A Comorbid History: Chronic  Obstructive N/A N/A Pulmonary Disease (COPD), Osteoarthritis Date Acquired: 12/28/2018 N/A N/A Weeks of Treatment: 7 N/A N/A Wound Status: Open N/A N/A Measurements L x W x D 1.2x1.4x0.3 N/A N/A (cm) Area (cm) : 1.319 N/A N/A Volume (cm) : 0.396 N/A N/A % Reduction in Area: -9.00% N/A N/A % Reduction in Volume: -63.60% N/A N/A Classification: Category/Stage III N/A N/A Exudate Amount: Small N/A N/A Exudate Type: Serous N/A N/A Exudate Color: amber N/A N/A Wound Margin: Epibole N/A N/A Granulation Amount:  Medium (34-66%) N/A N/A Granulation Quality: Pink N/A N/A Necrotic Amount: Medium (34-66%) N/A N/A Exposed Structures: Fat Layer (Subcutaneous N/A N/A Tissue) Exposed: Yes Fascia: No Tendon: No Muscle: No Chait, Ghalia L. ([id number]) Joint: No Bone: No Epithelialization: None N/A N/A Treatment Notes Electronic Signature(s) Signed: 07/02/2019 4:02:04 PM By: Montey Hora Entered By: Montey Hora on 07/02/2019 15:35:16 Gladu, Geraldyne L. ([id number]) -------------------------------------------------------------------------------- [place] Details Patient Name: Bessey, Rucha L. Date of Service: 07/02/2019 2:45 PM Medical Record Number: [id number] Patient Account Number: [id number] Date of Birth/Sex: 06-18-1928 (85 y.o. F) Treating RN: Montey Hora Primary Care Tomas Schamp: [name], [name] Other Clinician: Referring Kassandra Meriweather: [name] Treating Phat Dalton/Extender: Melburn Hake, HOYT Weeks in Treatment: 7 Active Inactive Abuse / Safety / Falls / Self Care Management Nursing Diagnoses: Potential for falls Goals: Patient will not experience any injury related to falls Date Initiated: 05/14/2019 Target Resolution Date: 08/14/2019 Goal Status: Active Interventions: Assess fall risk on admission and as needed Notes: Orientation to the Wound Care Program Nursing Diagnoses: Knowledge deficit related to the wound healing center  program Goals: Patient/caregiver will verbalize understanding of the [facility] Date Initiated: 05/14/2019 Target Resolution Date: 08/14/2019 Goal Status: Active Interventions: Provide education on orientation to the wound center Notes: Pressure Nursing Diagnoses: Potential for impaired tissue integrity related to pressure, friction, moisture, and shear Goals: Patient will remain free from development of additional pressure ulcers Date Initiated: 05/14/2019 Target Resolution Date: 08/14/2019 Goal Status: Active Interventions: Provide education on pressure ulcers Shinn, Corabelle L. ([id number]) Notes: Wound/Skin Impairment Nursing Diagnoses: Impaired tissue integrity Goals: Ulcer/skin breakdown will heal within 14 weeks Date Initiated: 05/14/2019 Target Resolution Date: 08/14/2019 Goal Status: Active Interventions: Assess patient/caregiver ability to obtain necessary supplies Assess patient/caregiver ability to perform ulcer/skin care regimen upon admission and as needed Assess ulceration(s) every visit Notes: Electronic Signature(s) Signed: 07/02/2019 4:02:04 PM By: Montey Hora Entered By: Montey Hora on 07/02/2019 15:35:04 Nedved, Brylei L. ([id number]) -------------------------------------------------------------------------------- Pain Assessment Details Patient Name: Danser, Tera L. Date of Service: 07/02/2019 2:45 PM Medical Record Number: [id number] Patient Account Number: [id number] Date of Birth/Sex: July 22, 1928 (85 y.o. F) Treating RN: Army Melia Primary Care Lynnex Fulp: [name] Other Clinician: Referring Josselyn Harkins: [name] Treating Lathan Gieselman/Extender: Melburn Hake, HOYT Weeks in Treatment: 7 Active Problems Location of Pain Severity and Description of Pain Patient Has Paino No Site Locations Pain Management and Medication Current Pain Management: Electronic Signature(s) Signed: 07/02/2019 3:12:16 PM By: Army Melia Entered By: Army Melia on 07/02/2019 15:06:21 [place], Dawson ([id number]) -------------------------------------------------------------------------------- Patient/Caregiver Education Details Patient Name: Fotopoulos, Delphine L. Date of Service: 07/02/2019 2:45 PM Medical Record Number: [id number] Patient Account Number: [id number] Date of Birth/Gender: 1928/01/20 (85 y.o. F) Treating RN: Montey Hora Primary Care Physician: [name], [name] Other Clinician: Referring Physician: [name] Treating Physician/Extender: Sharalyn Ink in Treatment: 7 Education Assessment Education Provided To: Patient and Caregiver Education Topics Provided Wound/Skin Impairment: Handouts: Other: wound care as ordered Methods: Demonstration, Explain/Verbal Responses: State content correctly Electronic Signature(s) Signed: 07/02/2019 4:02:04 PM By: Montey Hora Entered By: Montey Hora on 07/02/2019 15:41:50 Lapenna, Porscha L. ([id number]) -------------------------------------------------------------------------------- Wound Assessment Details Patient Name: Olmeda, Aliyanna L. Date of Service: 07/02/2019 2:45 PM Medical Record Number: [id number] Patient Account Number: [id number] Date of Birth/Sex: 04-03-1928 (85 y.o. F) Treating RN: Army Melia Primary Care Shaaron Golliday: [name] Other Clinician: Referring Cassell Voorhies: [name], [name] Treating Tacora Athanas/Extender: Melburn Hake, HOYT Weeks in Treatment: 7 Wound Status Wound Number: 1 Primary Pressure  Ulcer Etiology: Wound Location: Sacrum - Midline Wound Status: Open Wounding Event: Gradually Appeared Comorbid Chronic Obstructive Pulmonary Disease Date Acquired: 12/28/2018 History: (COPD), Osteoarthritis Weeks Of Treatment: 7 Clustered Wound: No Photos Wound Measurements Length: (cm) 1.2 % Reduction i Width: (cm) 1.4 % Reduction i Depth: (cm) 0.3 Epithelializa Area: (cm) 1.319 Tunneling: Volume: (cm) 0.396  Undermining: n Area: -9% n Volume: -63.6% tion: None No No Wound Description Classification: Category/Stage III Foul Odor Aft Wound Margin: Epibole Slough/Fibrin Exudate Amount: Small Exudate Type: Serous Exudate Color: amber er Cleansing: No o No Wound Bed Granulation Amount: Medium (34-66%) Exposed Structure Granulation Quality: Pink Fascia Exposed: No Necrotic Amount: Medium (34-66%) Fat Layer (Subcutaneous Tissue) Exposed: Yes Necrotic Quality: Adherent Slough Tendon Exposed: No Muscle Exposed: No Joint Exposed: No Bone Exposed: No Treatment Notes Frees, Mykaylah L. ([id number]) Wound #1 (Midline Sacrum) Notes silvercel, BFD Electronic Signature(s) Signed: 07/02/2019 3:12:16 PM By: Army Melia Entered By: Army Melia on 07/02/2019 15:10:14 Peltz, Kriss L. ([id number]) -------------------------------------------------------------------------------- Vitals Details Patient Name: Sava, Annison L. Date of Service: 07/02/2019 2:45 PM Medical Record Number: [id number] Patient Account Number: [id number] Date of Birth/Sex: 20-Nov-1927 (85 y.o. F) Treating RN: Army Melia Primary Care Nichele Slawson: [name], [name] Other Clinician: Referring Haygen Zebrowski: [name] Treating Tamella Tuccillo/Extender: Melburn Hake, HOYT Weeks in Treatment: 7 Vital Signs Time Taken: 15:06 Temperature (F): 99.0 Height (in): 60 Pulse (bpm): 108 Weight (lbs): 130 Respiratory Rate (breaths/min): 16 Body Mass Index (BMI): 25.4 Blood Pressure (mmHg): 136/69 Reference Range: 80 - 120 mg / dl Electronic Signature(s) Signed: 07/02/2019 3:12:16 PM By: Army Melia Entered By: Army Melia on 07/02/2019 15:06:59

## 2019-07-05 ENCOUNTER — Telehealth: Payer: Self-pay

## 2019-07-05 NOTE — Telephone Encounter (Signed)
SW contacted [name] (patient's daughter) to schedule palliative care visit. [name] reports that they have a busy week this week and will be out of town next week. [name] request that SW call back the week of September 14th. SW notified RN.

## 2019-07-05 NOTE — Progress Notes (Signed)
LAVAE, ARCIDIACONO ([id number]) Visit Report for 07/02/2019 Chief Complaint Document Details Patient Name: Joyce [name], [name] Date of Service: 07/02/2019 2:45 PM Medical Record Number: [id number] Patient Account Number: [id number] Date of Birth/Sex: [birth year] (85 y.o. F) Treating RN: Montey Hora Primary Care Provider: [name], [name] Other Clinician: Referring Provider: [name] Treating Provider/Extender: Melburn Hake, HOYT Weeks in Treatment: 7 Information Obtained from: Patient Chief Complaint Sacral pressure ulcer and bilateral LE lymphedema Electronic Signature(s) Signed: 07/04/2019 6:59:38 PM By: [name] PA-C Entered By: [name] on 07/04/2019 17:36:43 Joyce [name], [name] ([id number]) -------------------------------------------------------------------------------- HPI Details Patient Name: Joyce [name], [name] Date of Service: 07/02/2019 2:45 PM Medical Record Number: [id number] Patient Account Number: [id number] Date of Birth/Sex: 03-08-1928 (85 y.o. F) Treating RN: Montey Hora Primary Care Provider: [name], [name] Other Clinician: Referring Provider: [name] Treating Provider/Extender: Sharalyn Ink in Treatment: 7 History of Present Illness HPI Description: 05/14/19 on evaluation today patient appears to be doing rather well in regard to her lower extremities compared to what it sounds like she was doing not that far back. Fortunately there's no signs of active infection at this time although it does sound like she had significant cellulitis earlier in the year. She does also have a issue with a sacral ulcer upon inspection today which has been present since around February 2020 this is about as long as she's been dealing with her legs as well. She is under palliative care but not hospice. The patient does have a history of scoliosis and kyphosis which makes it difficult for her to lay flat she actually sleep sitting up in a recliner  but night and reclined at this cause her trouble breathing. She also has COPD, hypertension, and bilateral lower should be lymphedema and venous stasis. Currently there's no signs of active cellulitis of the bilateral lower extremities which is good news. The patient has not had any vascular intervention nor evaluation that we have on file. She does spend the majority per day apparently sitting in her recliner upright. This I think is an issue both for her legs as well as for the sacral region unfortunately. She could not tolerate the ABI's today due to the fact that it calls too much pain when we were performing the blood pressure reading on her lower extremities who were not even able to get a proper reading. No fevers, chills, nausea, or vomiting noted at this time. 05/25/19 on evaluation today patient appears to be doing about the same in regard to her sacral wound maybe slightly better compared to previous. Fortunately there's no signs of active infection at this time which is good news in regard to her lower extremities. She has previously had cellulitis therefore were trying to keep an eye on this as far as that's concerned. Nonetheless since I last saw her which was actually roughly 2 weeks ago since due to work her daughter was unable to bring her last week she is not had any care through home health and has not gotten compression stockings for her bilateral lower extremities. Subsequently the issue with home health seems to be multifactorial when they contacted the patient and her daughter in order to get things scheduled they were told according to the daughter that she didn't know why they needed to come out three times a week and wasn't aware that that was what the order was gonna be. The daughter tells me that that somewhat poor timing for her as she works  and doesn't have a lot of time to hang around at the house waiting for home health. With that being said I explained that the only  thing we can really do is either have home health come out and perform the dressing changes or else potentially have a family member do this. There apparently is no family member including herself who is willing to do this she tells me "that she doesn't feel qualified and comfortable". Nonetheless either way the patient has not had a dressing on the sacral area since the last time she was here she uses a pad hasn't really noted much drainage onto the pad although it does appear that the wound itself trapped a lot of the fluid within skin folds. In the end I do think that since she doesn't really have anyone to perform the dressing changes at home we are going to need to utilize the services of home health. They are in agreement with that plan. 06/01/19 on evaluation today patient presents for follow-up concerning her wound in the midline sacral area. This seems to be doing well there is some evidence of new skin growth there still an open sore however it is gonna require dressing. Home health is coming out to see her I think we do need to see about getting her a better schedule where potentially she comes entire clinic on Monday and then on Wednesday and Friday his home health change in her dressing as it stands right now I feel like this is happening to a regular for her. Fortunately there's no signs of any infection at this time her legs are doing well although she does have the compression stockings finally she doesn't actually have those on as of today. They want [place] to show them how to put them on. 06/08/19 on evaluation today patient came in today without any dressing on her sacral region. Apparently this fell off around Friday and unfortunately it was supposed to be reapplied Friday but the patient had an appointment scheduled for 11 o'clock and her home health nurse was supposed to arrive at 64. She was running late and therefore they called and canceled her coming out to perform the dressing  change and subsequently even though they had dressing material at home her daughter refuses to apply the dressing to the wound. For that reason she went all weekend without any dressing until today. She also has gotten the compression stockings but has just use the Tubigrip on occasion although she doesn't even have that on today. Apparently her daughter stating that the compression stockings are too hard to get on and therefore they're not able to do that. Nonetheless as I stated patient came in with nothing on her lower extremities as far as compression is concerned. They do want to see about extending her visits to every two weeks. BAUDELIA, PIERCEFIELD ([id number]) 07/02/19 on evaluation today patient presents after apparently having been quarantined for 14 days due to having been exposed to someone with Covid-19 Virus during the time that she is been quarantined she has not actually had her dressing changed it all apparently nobody would come around her including her daughter. Nonetheless her daughter hasn't been changing the dressing even prior they did have a home health nurse coming out but it Gillermina Phy may have been about a week's worth of time that the patient actually had home health change in the dressing and even then there was some issues with timing and them actually coming out an appropriate  time that the patient and her daughter felt was within reason. Nonetheless they feel like they had a much better experience with advanced homecare in the past. Electronic Signature(s) Signed: 07/04/2019 6:59:38 PM By: [name] PA-C Entered By: [name] on 07/04/2019 17:37:10 Joyce [name], [name] ([id number]) -------------------------------------------------------------------------------- Physical Exam Details Patient Name: Joyce [name], [name] Date of Service: 07/02/2019 2:45 PM Medical Record Number: [id number] Patient Account Number: [id number] Date of Birth/Sex: September 27, 1928 (85 y.o. F) Treating  RN: Montey Hora Primary Care Provider: [name], [name] Other Clinician: Referring Provider: [name], RICHARD Treating Provider/Extender: Melburn Hake, HOYT Weeks in Treatment: 7 Constitutional Well-nourished and well-hydrated in no acute distress. Respiratory normal breathing without difficulty. clear to auscultation bilaterally. Cardiovascular regular rate and rhythm with normal S1, S2. Psychiatric this patient is able to make decisions and demonstrates good insight into disease process. Alert and Oriented x 3. Joyce and cooperative. Notes Patient's wound bed actually appears to be doing about the same today it really is not significantly better also not significantly worse which is good news. Overall I am pleased in this regard. Electronic Signature(s) Signed: 07/04/2019 6:59:38 PM By: [name] PA-C Entered By: [name] on 07/04/2019 17:40:27 Joyce [name], Joyce [name] ([id number]) -------------------------------------------------------------------------------- Physician Orders Details Patient Name: Joyce [name], [name] Date of Service: 07/02/2019 2:45 PM Medical Record Number: [id number] Patient Account Number: [id number] Date of Birth/Sex: Oct 02, 1928 (85 y.o. F) Treating RN: Montey Hora Primary Care Provider: [name], [name] Other Clinician: Referring Provider: [name] Treating Provider/Extender: Melburn Hake, HOYT Weeks in Treatment: 7 Verbal / Phone Orders: No Diagnosis Coding Wound Cleansing Wound #1 Midline Sacrum o Dial antibacterial soap, wash wounds, rinse and pat dry prior to dressing wounds o May Shower, gently pat wound dry prior to applying new dressing. Primary Wound Dressing Wound #1 Midline Sacrum o Silver Alginate Secondary Dressing Wound #1 Midline Sacrum o Boardered Foam Dressing Dressing Change Frequency Wound #1 Midline Sacrum o Change Dressing Monday, Wednesday, Friday Follow-up Appointments Wound #1 Midline  Sacrum o Return Appointment in 1 week. Edema Control o Patient to wear own compression stockings o Elevate legs to the level of the heart and pump ankles as often as possible Home Health Wound #1 Midline [place] for Skilled Nursing - Patient requests [place] Nurse may visit PRN to address patientos wound care needs. o FACE TO FACE ENCOUNTER: MEDICARE and MEDICAID PATIENTS: I certify that this patient is under my care and that I had a face-to-face encounter that meets the physician face-to-face encounter requirements with this patient on this date. The encounter with the patient was in whole or in part for the following MEDICAL CONDITION: (primary reason for [place]) MEDICAL NECESSITY: I certify, that based on my findings, NURSING services are a medically necessary home health service. HOME BOUND STATUS: I certify that my clinical findings support that this patient is homebound (i.e., Due to illness or injury, pt requires aid of supportive devices such as crutches, cane, wheelchairs, walkers, the use of special transportation or the assistance of another person to leave their place of residence. There is a normal inability to leave the home and doing so requires considerable and taxing effort. Other absences are for medical reasons / religious services and are infrequent or of short duration when for other reasons). o If current dressing causes regression in wound condition, may D/C ordered dressing product/s and apply Normal Saline Moist Dressing daily until next [place] /  Other MD appointment. [place] of regression in wound condition at [phone]. o Please direct any NON-WOUND related issues/requests for orders to patient's Primary Care Physician Joyce [name], Joyce [name] ([id number]) Electronic Signature(s) Signed: 07/02/2019 4:02:04 PM By: Montey Hora Signed: 07/04/2019 6:59:38 PM By: [name]  PA-C Entered By: Montey Hora on 07/02/2019 15:45:28 Joyce [name], [name] ([id number]) -------------------------------------------------------------------------------- Problem List Details Patient Name: Vasconez, Kei L. Date of Service: 07/02/2019 2:45 PM Medical Record Number: [id number] Patient Account Number: [id number] Date of Birth/Sex: 09-Jul-1928 (85 y.o. F) Treating RN: Montey Hora Primary Care Provider: [name], [name] Other Clinician: Referring Provider: [name], [name] Treating Provider/Extender: Sharalyn Ink in Treatment: 7 Active Problems ICD-10 Evaluated Encounter Code Description Active Date Today Diagnosis L89.153 Pressure ulcer of sacral region, stage 3 05/14/2019 No Yes I89.0 Lymphedema, not elsewhere classified 05/14/2019 No Yes I87.2 Venous insufficiency (chronic) (peripheral) 05/14/2019 No Yes I10 Essential (primary) hypertension 05/14/2019 No Yes J44.9 Chronic obstructive pulmonary disease, unspecified 05/14/2019 No Yes Inactive Problems Resolved Problems Electronic Signature(s) Signed: 07/04/2019 6:59:38 PM By: [name] PA-C Entered By: [name] on 07/04/2019 17:36:34 Gittins, Delona L. ([id number]) -------------------------------------------------------------------------------- Progress Note Details Patient Name: Joyce [name], [name] Date of Service: 07/02/2019 2:45 PM Medical Record Number: [id number] Patient Account Number: [id number] Date of Birth/Sex: Nov 27, 1927 (85 y.o. F) Treating RN: Montey Hora Primary Care Provider: [name], [name] Other Clinician: Referring Provider: [name] Treating Provider/Extender: Sharalyn Ink in Treatment: 7 Subjective Chief Complaint Information obtained from Patient Sacral pressure ulcer and bilateral LE lymphedema History of Present Illness (HPI) 05/14/19 on evaluation today patient appears to be doing rather well in regard to her lower extremities compared to what  it sounds like she was doing not that far back. Fortunately there's no signs of active infection at this time although it does sound like she had significant cellulitis earlier in the year. She does also have a issue with a sacral ulcer upon inspection today which has been present since around February 2020 this is about as long as she's been dealing with her legs as well. She is under palliative care but not hospice. The patient does have a history of scoliosis and kyphosis which makes it difficult for her to lay flat she actually sleep sitting up in a recliner but night and reclined at this cause her trouble breathing. She also has COPD, hypertension, and bilateral lower should be lymphedema and venous stasis. Currently there's no signs of active cellulitis of the bilateral lower extremities which is good news. The patient has not had any vascular intervention nor evaluation that we have on file. She does spend the majority per day apparently sitting in her recliner upright. This I think is an issue both for her legs as well as for the sacral region unfortunately. She could not tolerate the ABI's today due to the fact that it calls too much pain when we were performing the blood pressure reading on her lower extremities who were not even able to get a proper reading. No fevers, chills, nausea, or vomiting noted at this time. 05/25/19 on evaluation today patient appears to be doing about the same in regard to her sacral wound maybe slightly better compared to previous. Fortunately there's no signs of active infection at this time which is good news in regard to her lower extremities. She has previously had cellulitis therefore were trying to keep an eye on this as far as that's concerned. Nonetheless since I last  saw her which was actually roughly 2 weeks ago since due to work her daughter was unable to bring her last week she is not had any care through home health and has not gotten compression  stockings for her bilateral lower extremities. Subsequently the issue with home health seems to be multifactorial when they contacted the patient and her daughter in order to get things scheduled they were told according to the daughter that she didn't know why they needed to come out three times a week and wasn't aware that that was what the order was gonna be. The daughter tells me that that somewhat poor timing for her as she works and doesn't have a lot of time to hang around at the house waiting for home health. With that being said I explained that the only thing we can really do is either have home health come out and perform the dressing changes or else potentially have a family member do this. There apparently is no family member including herself who is willing to do this she tells me "that she doesn't feel qualified and comfortable". Nonetheless either way the patient has not had a dressing on the sacral area since the last time she was here she uses a pad hasn't really noted much drainage onto the pad although it does appear that the wound itself trapped a lot of the fluid within skin folds. In the end I do think that since she doesn't really have anyone to perform the dressing changes at home we are going to need to utilize the services of home health. They are in agreement with that plan. 06/01/19 on evaluation today patient presents for follow-up concerning her wound in the midline sacral area. This seems to be doing well there is some evidence of new skin growth there still an open sore however it is gonna require dressing. Home health is coming out to see her I think we do need to see about getting her a better schedule where potentially she comes entire clinic on Monday and then on Wednesday and Friday his home health change in her dressing as it stands right now I feel like this is happening to a regular for her. Fortunately there's no signs of any infection at this time her legs  are doing well although she does have the compression stockings finally she doesn't actually have those on as of today. They want [place] to show them how to put them on. 06/08/19 on evaluation today patient came in today without any dressing on her sacral region. Apparently this fell off around Friday and unfortunately it was supposed to be reapplied Friday but the patient had an appointment scheduled for 11 o'clock and her home health nurse was supposed to arrive at 5. She was running late and therefore they called and canceled her Joyce [name], BOLZ. ([id number]) coming out to perform the dressing change and subsequently even though they had dressing material at home her daughter refuses to apply the dressing to the wound. For that reason she went all weekend without any dressing until today. She also has gotten the compression stockings but has just use the Tubigrip on occasion although she doesn't even have that on today. Apparently her daughter stating that the compression stockings are too hard to get on and therefore they're not able to do that. Nonetheless as I stated patient came in with nothing on her lower extremities as far as compression is concerned. They do want to see about extending her visits  to every two weeks. 07/02/19 on evaluation today patient presents after apparently having been quarantined for 14 days due to having been exposed to someone with Covid-19 Virus during the time that she is been quarantined she has not actually had her dressing changed it all apparently nobody would come around her including her daughter. Nonetheless her daughter hasn't been changing the dressing even prior they did have a home health nurse coming out but it Gillermina Phy may have been about a week's worth of time that the patient actually had home health change in the dressing and even then there was some issues with timing and them actually coming out an appropriate time that the patient and her daughter felt  was within reason. Nonetheless they feel like they had a much better experience with advanced home[facility] in the past. Patient History Information obtained from Patient. Family History Cancer - Child, Diabetes - Siblings, Hypertension - Siblings, Stroke - Mother, No family history of Heart Disease, Kidney Disease, Lung Disease, Seizures, Thyroid Problems, Tuberculosis. Social History Never smoker, Marital Status - Widowed, Alcohol Use - Rarely, Caffeine Use - Daily. Medical History Respiratory Patient has history of Chronic Obstructive Pulmonary Disease (COPD) Integumentary (Skin) Denies history of History of pressure wounds Musculoskeletal Patient has history of Osteoarthritis - hands, knees, sholders Oncologic Denies history of Received Chemotherapy, Received Radiation Review of Systems (ROS) Constitutional Symptoms (General Health) Denies complaints or symptoms of Fatigue, Fever, Chills, Marked Weight Change. Respiratory Denies complaints or symptoms of Chronic or frequent coughs, Shortness of Breath. Cardiovascular Denies complaints or symptoms of Chest pain, LE edema. Psychiatric Denies complaints or symptoms of Anxiety, Claustrophobia. Objective Constitutional Well-nourished and well-hydrated in no acute distress. Sidell, Naiara L. ([id number]) Vitals Time Taken: 3:06 PM, Height: 60 in, Weight: 130 lbs, BMI: 25.4, Temperature: 99.0 F, Pulse: 108 bpm, Respiratory Rate: 16 breaths/min, Blood Pressure: 136/69 mmHg. Respiratory normal breathing without difficulty. clear to auscultation bilaterally. Cardiovascular regular rate and rhythm with normal S1, S2. Psychiatric this patient is able to make decisions and demonstrates good insight into disease process. Alert and Oriented x 3. Joyce and cooperative. General Notes: Patient's wound bed actually appears to be doing about the same today it really is not significantly better also not significantly worse which is good news.  Overall I am pleased in this regard. Integumentary (Hair, Skin) Wound #1 status is Open. Original cause of wound was Gradually Appeared. The wound is located on the Midline Sacrum. The wound measures 1.2cm length x 1.4cm width x 0.3cm depth; 1.319cm^2 area and 0.396cm^3 volume. There is Fat Layer (Subcutaneous Tissue) Exposed exposed. There is no tunneling or undermining noted. There is a small amount of serous drainage noted. The wound margin is epibole. There is medium (34-66%) pink granulation within the wound bed. There is a medium (34-66%) amount of necrotic tissue within the wound bed including Adherent Slough. Assessment Active Problems ICD-10 Pressure ulcer of sacral region, stage 3 Lymphedema, not elsewhere classified Venous insufficiency (chronic) (peripheral) Essential (primary) hypertension Chronic obstructive pulmonary disease, unspecified Plan Wound Cleansing: Wound #1 Midline Sacrum: Dial antibacterial soap, wash wounds, rinse and pat dry prior to dressing wounds May Shower, gently pat wound dry prior to applying new dressing. Primary Wound Dressing: Wound #1 Midline Sacrum: Silver Alginate Secondary Dressing: Wound #1 Midline Sacrum: Boardered Foam Dressing Dressing Change Frequency: Wound #1 Midline SacrumCHERAE, KIMAK. ([id number]) Change Dressing Monday, Wednesday, Friday Follow-up Appointments: Wound #1 Midline Sacrum: Return Appointment in 1 week. Edema Control: Patient to wear own  compression stockings Elevate legs to the level of the heart and pump ankles as often as possible Home Health: Wound #1 Midline Sacrum: [place] for Skilled Nursing - Patient requests [place] Nurse may visit PRN to address patient s wound care needs. FACE TO FACE ENCOUNTER: MEDICARE and MEDICAID PATIENTS: I certify that this patient is under my care and that I had a face-to-face encounter that meets the physician face-to-face encounter requirements  with this patient on this date. The encounter with the patient was in whole or in part for the following MEDICAL CONDITION: (primary reason for [place]) MEDICAL NECESSITY: I certify, that based on my findings, NURSING services are a medically necessary home health service. HOME BOUND STATUS: I certify that my clinical findings support that this patient is homebound (i.e., Due to illness or injury, pt requires aid of supportive devices such as crutches, cane, wheelchairs, walkers, the use of special transportation or the assistance of another person to leave their place of residence. There is a normal inability to leave the home and doing so requires considerable and taxing effort. Other absences are for medical reasons / religious services and are infrequent or of short duration when for other reasons). If current dressing causes regression in wound condition, may D/C ordered dressing product/s and apply Normal Saline Moist Dressing daily until next [place] / Other MD appointment. [place] of regression in wound condition at [phone]. Please direct any NON-WOUND related issues/requests for orders to patient's Primary Care Physician 1. I'm in a recommend currently that we continue with the silver alginate and dressing this on a regular basis that hopefully it will keep the moisture down and allow the area to heal appropriately. 2. Patient's wound dressing needs to be changed at least every other day she really needs to have this in place at all times it should not be without it at any time before gonna see this improve. 3. I also recommend that she not sit for long periods of time as this would definitely make this worse as well. Please see above for specific wound care orders. We will see patient for re-evaluation in 1 week(s) here in the clinic. If anything worsens or changes patient will contact our office for additional recommendations. Electronic  Signature(s) Signed: 07/04/2019 6:59:38 PM By: [name] PA-C Entered By: [name] on 07/04/2019 17:43:03 Joyce [name], [name] ([id number]) -------------------------------------------------------------------------------- ROS/PFSH Details Patient Name: Thau, Kelaiah L. Date of Service: 07/02/2019 2:45 PM Medical Record Number: [id number] Patient Account Number: [id number] Date of Birth/Sex: 05-11-1928 (85 y.o. F) Treating RN: Montey Hora Primary Care Provider: [name], [name] Other Clinician: Referring Provider: [name] Treating Provider/Extender: Melburn Hake, HOYT Weeks in Treatment: 7 Information Obtained From Patient Constitutional Symptoms (General Health) Complaints and Symptoms: Negative for: Fatigue; Fever; Chills; Marked Weight Change Respiratory Complaints and Symptoms: Negative for: Chronic or frequent coughs; Shortness of Breath Medical History: Positive for: Chronic Obstructive Pulmonary Disease (COPD) Cardiovascular Complaints and Symptoms: Negative for: Chest pain; LE edema Psychiatric Complaints and Symptoms: Negative for: Anxiety; Claustrophobia Integumentary (Skin) Medical History: Negative for: History of pressure wounds Musculoskeletal Medical History: Positive for: Osteoarthritis - hands, knees, sholders Oncologic Medical History: Negative for: Received Chemotherapy; Received Radiation Immunizations Pneumococcal Vaccine: Received Pneumococcal Vaccination: Yes Implantable Devices None Family and Social History TEJAH, FORTINI ([id number]) Cancer: Yes - Child; Diabetes: Yes - Siblings; Heart Disease: No; Hypertension: Yes - Siblings; Kidney Disease: No; Lung Disease: No; Seizures: No;  Stroke: Yes - Mother; Thyroid Problems: No; Tuberculosis: No; Never smoker; Marital Status - Widowed; Alcohol Use: Rarely; Caffeine Use: Daily Physician Affirmation I have reviewed and agree with the above information. Electronic  Signature(s) Signed: 07/04/2019 6:59:38 PM By: [name] PA-C Signed: 07/05/2019 3:47:55 PM By: Montey Hora Entered By: [name] on 07/04/2019 17:37:25 Schneck, Kebrina L. ([id number]) -------------------------------------------------------------------------------- SuperBill Details Patient Name: Bucklew, Sherby L. Date of Service: 07/02/2019 Medical Record Number: [id number] Patient Account Number: [id number] Date of Birth/Sex: 1928/01/04 (85 y.o. F) Treating RN: Montey Hora Primary Care Provider: [name], [name] Other Clinician: Referring Provider: [name] Treating Provider/Extender: Melburn Hake, HOYT Weeks in Treatment: 7 Diagnosis Coding ICD-10 Codes Code Description L89.153 Pressure ulcer of sacral region, stage 3 I89.0 Lymphedema, not elsewhere classified I87.2 Venous insufficiency (chronic) (peripheral) I10 Essential (primary) hypertension J44.9 Chronic obstructive pulmonary disease, unspecified Facility Procedures CPT4 Code: [id number] Description: [id number] - WOUND CARE VISIT-LEV 3 EST PT Modifier: Quantity: 1 Physician Procedures CPT4 Code: [id number] Description: [id number] - WC PHYS LEVEL 4 - EST PT ICD-10 Diagnosis Description L89.153 Pressure ulcer of sacral region, stage 3 I89.0 Lymphedema, not elsewhere classified I87.2 Venous insufficiency (chronic) (peripheral) I10 Essential (primary) hypertension Modifier: Quantity: 1 Electronic Signature(s) Signed: 07/04/2019 6:59:38 PM By: [name] PA-C Entered By: [name] on 07/04/2019 17:43:23

## 2019-07-09 ENCOUNTER — Encounter: Payer: Medicare Other | Attending: Physician Assistant | Admitting: Physician Assistant

## 2019-07-09 ENCOUNTER — Other Ambulatory Visit: Payer: Self-pay | Admitting: Family Medicine

## 2019-07-09 ENCOUNTER — Other Ambulatory Visit: Payer: Self-pay | Admitting: *Deleted

## 2019-07-09 ENCOUNTER — Other Ambulatory Visit: Payer: Self-pay

## 2019-07-09 DIAGNOSIS — M1990 Unspecified osteoarthritis, unspecified site: Secondary | ICD-10-CM | POA: Insufficient documentation

## 2019-07-09 DIAGNOSIS — L89159 Pressure ulcer of sacral region, unspecified stage: Secondary | ICD-10-CM | POA: Diagnosis not present

## 2019-07-09 DIAGNOSIS — Z809 Family history of malignant neoplasm, unspecified: Secondary | ICD-10-CM | POA: Diagnosis not present

## 2019-07-09 DIAGNOSIS — I872 Venous insufficiency (chronic) (peripheral): Secondary | ICD-10-CM | POA: Insufficient documentation

## 2019-07-09 DIAGNOSIS — Z8249 Family history of ischemic heart disease and other diseases of the circulatory system: Secondary | ICD-10-CM | POA: Diagnosis not present

## 2019-07-09 DIAGNOSIS — J449 Chronic obstructive pulmonary disease, unspecified: Secondary | ICD-10-CM | POA: Diagnosis not present

## 2019-07-09 DIAGNOSIS — L89153 Pressure ulcer of sacral region, stage 3: Secondary | ICD-10-CM | POA: Insufficient documentation

## 2019-07-09 DIAGNOSIS — I10 Essential (primary) hypertension: Secondary | ICD-10-CM | POA: Diagnosis not present

## 2019-07-09 DIAGNOSIS — I890 Lymphedema, not elsewhere classified: Secondary | ICD-10-CM | POA: Insufficient documentation

## 2019-07-09 DIAGNOSIS — Z20828 Contact with and (suspected) exposure to other viral communicable diseases: Secondary | ICD-10-CM | POA: Diagnosis not present

## 2019-07-09 DIAGNOSIS — M419 Scoliosis, unspecified: Secondary | ICD-10-CM | POA: Diagnosis not present

## 2019-07-09 MED ORDER — DILTIAZEM HCL ER 120 MG PO CP24: 120.0000 mg | ORAL_CAPSULE | Freq: Every day | ORAL | 0 refills | Status: DC

## 2019-07-09 NOTE — Progress Notes (Addendum)
SHAWNESSY, MAXHAM ([id number]) Visit Report for 07/09/2019 Arrival Information Details Patient Name: Joyce [name], [name] Date of Service: 07/09/2019 3:15 PM Medical Record Number: [id number] Patient Account Number: [id number] Date of Birth/Sex: September 11, 1928 (85 y.o. F) Treating RN: Montey Hora Primary Care Jahmya Onofrio: [name], [name] Other Clinician: Referring Zayleigh Stroh: [name] Treating Alvin Rubano/Extender: Melburn Hake, HOYT Weeks in Treatment: 8 Visit Information History Since Last Visit Added or deleted any medications: No Patient Arrived: Walker Any new allergies or adverse reactions: No Arrival Time: 15:13 Had a fall or experienced change in No Accompanied By: self activities of daily living that may affect Transfer Assistance: Manual risk of falls: Patient Requires Transmission-Based No Signs or symptoms of abuse/neglect since last visito No Precautions: Hospitalized since last visit: No Patient Has Alerts: Yes Implantable device outside of the clinic excluding No Patient Alerts: Not cellular tissue based products placed in the center Diabetic since last visit: Has Dressing in Place as Prescribed: Yes Pain Present Now: No Electronic Signature(s) Signed: 07/09/2019 3:54:12 PM By: [name] RCP, RRT, CHT Entered By: [name] on 07/09/2019 15:16:11 Joyce [name], [name] ([id number]) -------------------------------------------------------------------------------- Clinic Level of Care Assessment Details Patient Name: Joyce [name], [name] Date of Service: 07/09/2019 3:15 PM Medical Record Number: [id number] Patient Account Number: [id number] Date of Birth/Sex: May 19, 1928 (85 y.o. F) Treating RN: Montey Hora Primary Care Devantae Babe: [name], [name] Other Clinician: Referring Nekhi Liwanag: [name] Treating Jhoanna Heyde/Extender: Melburn Hake, HOYT Weeks in Treatment: 8 Clinic Level of Care Assessment Items TOOL 4 Quantity Score []  - Use  when only an EandM is performed on FOLLOW-UP visit 0 ASSESSMENTS - Nursing Assessment / Reassessment X - Reassessment of Co-morbidities (includes updates in patient status) 1 10 X- 1 5 Reassessment of Adherence to Treatment Plan ASSESSMENTS - Wound and Skin Assessment / Reassessment X - Simple Wound Assessment / Reassessment - one wound 1 5 []  - 0 Complex Wound Assessment / Reassessment - multiple wounds []  - 0 Dermatologic / Skin Assessment (not related to wound area) ASSESSMENTS - Focused Assessment []  - Circumferential Edema Measurements - multi extremities 0 []  - 0 Nutritional Assessment / Counseling / Intervention []  - 0 Lower Extremity Assessment (monofilament, tuning fork, pulses) []  - 0 Peripheral Arterial Disease Assessment (using hand held doppler) ASSESSMENTS - Ostomy and/or Continence Assessment and Care []  - Incontinence Assessment and Management 0 []  - 0 Ostomy Care Assessment and Management (repouching, etc.) PROCESS - Coordination of Care X - Simple Patient / Family Education for ongoing care 1 15 []  - 0 Complex (extensive) Patient / Family Education for ongoing care X- 1 10 Staff obtains Programmer, systems, Records, Test Results / Process Orders []  - 0 Staff telephones HHA, Nursing Homes / Clarify orders / etc []  - 0 Routine Transfer to another Facility (non-emergent condition) []  - 0 Routine Hospital Admission (non-emergent condition) []  - 0 New Admissions / Biomedical engineer / Ordering NPWT, Apligraf, etc. []  - 0 Emergency Hospital Admission (emergent condition) X- 1 10 Simple Discharge Coordination Joyce [name], [name] ([id number]) []  - 0 Complex (extensive) Discharge Coordination PROCESS - Special Needs []  - Pediatric / Minor Patient Management 0 []  - 0 Isolation Patient Management []  - 0 Hearing / Language / Visual special needs []  - 0 Assessment of Community assistance (transportation, D/C planning, etc.) []  - 0 Additional assistance / Altered  mentation []  - 0 Support Surface(s) Assessment (bed, cushion, seat, etc.) INTERVENTIONS - Wound Cleansing / Measurement X - Simple Wound Cleansing - one wound 1 5 []  -  0 Complex Wound Cleansing - multiple wounds X- 1 5 Wound Imaging (photographs - any number of wounds) []  - 0 Wound Tracing (instead of photographs) X- 1 5 Simple Wound Measurement - one wound []  - 0 Complex Wound Measurement - multiple wounds INTERVENTIONS - Wound Dressings X - Small Wound Dressing one or multiple wounds 1 10 []  - 0 Medium Wound Dressing one or multiple wounds []  - 0 Large Wound Dressing one or multiple wounds []  - 0 Application of Medications - topical []  - 0 Application of Medications - injection INTERVENTIONS - Miscellaneous []  - External ear exam 0 []  - 0 Specimen Collection (cultures, biopsies, blood, body fluids, etc.) []  - 0 Specimen(s) / Culture(s) sent or taken to Lab for analysis []  - 0 Patient Transfer (multiple staff / Civil Service fast streamer / Similar devices) []  - 0 Simple Staple / Suture removal (25 or less) []  - 0 Complex Staple / Suture removal (26 or more) []  - 0 Hypo / Hyperglycemic Management (close monitor of Blood Glucose) []  - 0 Ankle / Brachial Index (ABI) - do not check if billed separately X- 1 5 Vital Signs Joyce [name], [name] ([id number]) Has the patient been seen at the hospital within the last three years: Yes Total Score: 85 Level Of Care: New/Established - Level 3 Electronic Signature(s) Signed: 07/09/2019 4:03:55 PM By: Montey Hora Entered By: Montey Hora on 07/09/2019 15:39:00 Joyce [name], [name] ([id number]) -------------------------------------------------------------------------------- Encounter Discharge Information Details Patient Name: Joyce [name], [name] Date of Service: 07/09/2019 3:15 PM Medical Record Number: [id number] Patient Account Number: [id number] Date of Birth/Sex: 10-10-1928 (85 y.o. F) Treating RN: Montey Hora Primary Care Tennis Mckinnon: [name],  [name] Other Clinician: Referring Jaasiel [place]: [name] Treating Nalleli Largent/Extender: Sharalyn Ink in Treatment: 8 Encounter Discharge Information Items Discharge Condition: Stable Ambulatory Status: Walker Discharge Destination: Home Transportation: Private Auto Accompanied By: self Schedule Follow-up Appointment: Yes Clinical Summary of Care: Electronic Signature(s) Signed: 07/09/2019 4:03:55 PM By: Montey Hora Entered By: Montey Hora on 07/09/2019 15:40:55 Siedschlag, Raelie L. ([id number]) -------------------------------------------------------------------------------- Lower Extremity Assessment Details Patient Name: Joyce [name], [name] Date of Service: 07/09/2019 3:15 PM Medical Record Number: [id number] Patient Account Number: [id number] Date of Birth/Sex: Jun 13, 1928 (85 y.o. F) Treating RN: Army Melia Primary Care Shronda Boeh: [name] Other Clinician: Referring Darreon Lutes: [name], RICHARD Treating Montoya Watkin/Extender: Melburn Hake, HOYT Weeks in Treatment: 8 Electronic Signature(s) Signed: 07/09/2019 3:25:39 PM By: Army Melia Entered By: Army Melia on 07/09/2019 15:20:53 Joyce [name], [name] ([id number]) -------------------------------------------------------------------------------- Multi Wound Chart Details Patient Name: Joyce [name], [name] Date of Service: 07/09/2019 3:15 PM Medical Record Number: [id number] Patient Account Number: [id number] Date of Birth/Sex: 24-Jun-1928 (85 y.o. F) Treating RN: Montey Hora Primary Care Sharan Mcenaney: [name], [name] Other Clinician: Referring Yaminah Clayborn: [name], [name] Treating Valinda Fedie/Extender: Melburn Hake, HOYT Weeks in Treatment: 8 Vital Signs Height(in): 60 Pulse(bpm): 109 Weight(lbs): 130 Blood Pressure(mmHg): 138/50 Body Mass Index(BMI): 25 Temperature(F): 98.7 Respiratory Rate 16 (breaths/min): Photos: [N/A:N/A] Wound Location: Sacrum - Midline N/A N/A Wounding Event: Gradually Appeared N/A  N/A Primary Etiology: Pressure Ulcer N/A N/A Comorbid History: Chronic Obstructive N/A N/A Pulmonary Disease (COPD), Osteoarthritis Date Acquired: 12/28/2018 N/A N/A Weeks of Treatment: 8 N/A N/A Wound Status: Open N/A N/A Measurements L x W x D 1x0.8x0.5 N/A N/A (cm) Area (cm) : 0.628 N/A N/A Volume (cm) : 0.314 N/A N/A % Reduction in Area: 48.10% N/A N/A % Reduction in Volume: -29.80% N/A N/A Starting Position 1 11 (o'clock): Ending Position 1 1 (o'clock): Maximum Distance 1 (  cm): 0.3 Undermining: Yes N/A N/A Classification: Category/Stage III N/A N/A Exudate Amount: Small N/A N/A Exudate Type: Serous N/A N/A Exudate Color: amber N/A N/A Wound Margin: Epibole N/A N/A Granulation Amount: Medium (34-66%) N/A N/A Granulation Quality: Pink N/A N/A Necrotic Amount: Medium (34-66%) N/A N/A Riva, Oriana L. ([id number]) Exposed Structures: Fat Layer (Subcutaneous N/A N/A Tissue) Exposed: Yes Fascia: No Tendon: No Muscle: No Joint: No Bone: No Epithelialization: None N/A N/A Treatment Notes Electronic Signature(s) Signed: 07/09/2019 4:03:55 PM By: Montey Hora Entered By: Montey Hora on 07/09/2019 15:36:25 Joyce [name], [name] ([id number]) -------------------------------------------------------------------------------- [place] Details Patient Name: Joyce [name], [name] Date of Service: 07/09/2019 3:15 PM Medical Record Number: [id number] Patient Account Number: [id number] Date of Birth/Sex: [birth year] (85 y.o. F) Treating RN: Montey Hora Primary Care Lene Mckay: [name], [name] Other Clinician: Referring Persephone Schriever: [name] Treating Rani Idler/Extender: Melburn Hake, HOYT Weeks in Treatment: 8 Active Inactive Abuse / Safety / Falls / Self Care Management Nursing Diagnoses: Potential for falls Goals: Patient will not experience any injury related to falls Date Initiated: 05/14/2019 Target Resolution Date: 08/14/2019 Goal Status:  Active Interventions: Assess fall risk on admission and as needed Notes: Orientation to the Wound Care Program Nursing Diagnoses: Knowledge deficit related to the wound healing center program Goals: Patient/caregiver will verbalize understanding of the [place] Program Date Initiated: 05/14/2019 Target Resolution Date: 08/14/2019 Goal Status: Active Interventions: Provide education on orientation to the wound center Notes: Pressure Nursing Diagnoses: Potential for impaired tissue integrity related to pressure, friction, moisture, and shear Goals: Patient will remain free from development of additional pressure ulcers Date Initiated: 05/14/2019 Target Resolution Date: 08/14/2019 Goal Status: Active Interventions: Provide education on pressure ulcers Joyce [name], Ineta L. ([id number]) Notes: Wound/Skin Impairment Nursing Diagnoses: Impaired tissue integrity Goals: Ulcer/skin breakdown will heal within 14 weeks Date Initiated: 05/14/2019 Target Resolution Date: 08/14/2019 Goal Status: Active Interventions: Assess patient/caregiver ability to obtain necessary supplies Assess patient/caregiver ability to perform ulcer/skin care regimen upon admission and as needed Assess ulceration(s) every visit Notes: Electronic Signature(s) Signed: 07/09/2019 4:03:55 PM By: Montey Hora Entered By: Montey Hora on 07/09/2019 [place], [place]. ([id number]) -------------------------------------------------------------------------------- Pain Assessment Details Patient Name: Boardman, Journei L. Date of Service: 07/09/2019 3:15 PM Medical Record Number: [id number] Patient Account Number: [id number] Date of Birth/Sex: 1928/08/04 (85 y.o. F) Treating RN: Montey Hora Primary Care Moyinoluwa Dawe: [name], [name] Other Clinician: Referring Vineet Kinney: [name] Treating Han Lysne/Extender: Melburn Hake, HOYT Weeks in Treatment: 8 Active Problems Location of Pain Severity and  Description of Pain Patient Has Paino No Site Locations Pain Management and Medication Current Pain Management: Electronic Signature(s) Signed: 07/09/2019 3:54:12 PM By: Paulla Fore, RRT, CHT Signed: 07/09/2019 4:03:55 PM By: Montey Hora Entered By: [name] on 07/09/2019 15:16:19 [place], [place] ([id number]) -------------------------------------------------------------------------------- Patient/Caregiver Education Details Patient Name: Kann, Xandrea L. Date of Service: 07/09/2019 3:15 PM Medical Record Number: [id number] Patient Account Number: [id number] Date of Birth/Gender: 1928/03/07 (85 y.o. F) Treating RN: Montey Hora Primary Care Physician: [name], [name] Other Clinician: Referring Physician: [name] Treating Physician/Extender: Sharalyn Ink in Treatment: 8 Education Assessment Education Provided To: Patient Education Topics Provided Wound/Skin Impairment: Handouts: Other: need for [facility] for wound care Methods: Explain/Verbal Responses: State content correctly Electronic Signature(s) Signed: 07/09/2019 4:03:55 PM By: Montey Hora Entered By: Montey Hora on 07/09/2019 15:39:23 [place], Carisma L. ([id number]) -------------------------------------------------------------------------------- Wound Assessment Details Patient Name: Kilcrease, Odella L. Date of Service: 07/09/2019 3:15 PM Medical Record Number: [id number]  Patient Account Number: [id number] Date of Birth/Sex: 30-Oct-1928 (85 y.o. F) Treating RN: Army Melia Primary Care Mistey Hoffert: [name], [name] Other Clinician: Referring Rubbie Goostree: [name] Treating Anjalina Bergevin/Extender: Melburn Hake, HOYT Weeks in Treatment: 8 Wound Status Wound Number: 1 Primary Pressure Ulcer Etiology: Wound Location: Sacrum - Midline Wound Status: Open Wounding Event: Gradually Appeared Comorbid Chronic Obstructive Pulmonary Disease Date Acquired:  12/28/2018 History: (COPD), Osteoarthritis Weeks Of Treatment: 8 Clustered Wound: No Photos Wound Measurements Length: (cm) 1 % Reduction i Width: (cm) 0.8 % Reduction i Depth: (cm) 0.5 Epithelializa Area: (cm) 0.628 Undermining: Volume: (cm) 0.314 Starting Ending Pos Maximum Di n Area: 48.1% n Volume: -29.8% tion: None Yes Position (o'clock): 11 ition (o'clock): 1 stance: (cm) 0.3 Wound Description Classification: Category/Stage III Foul Odor Aft Wound Margin: Epibole Slough/Fibrin Exudate Amount: Small Exudate Type: Serous Exudate Color: amber er Cleansing: No o No Wound Bed Granulation Amount: Medium (34-66%) Exposed Structure Granulation Quality: Pink Fascia Exposed: No Necrotic Amount: Medium (34-66%) Fat Layer (Subcutaneous Tissue) Exposed: Yes Necrotic Quality: Adherent Slough Tendon Exposed: No Muscle Exposed: No Joint Exposed: No Bone Exposed: No [place], [place]. ([id number]) Treatment Notes Wound #1 (Midline Sacrum) Notes silvercel, BFD Electronic Signature(s) Signed: 07/09/2019 3:25:39 PM By: Army Melia Entered By: Army Melia on 07/09/2019 15:20:34 Ruehl, Kristalynn L. ([id number]) -------------------------------------------------------------------------------- Vitals Details Patient Name: Youngers, Luella L. Date of Service: 07/09/2019 3:15 PM Medical Record Number: [id number] Patient Account Number: [id number] Date of Birth/Sex: 1928/11/02 (85 y.o. F) Treating RN: Montey Hora Primary Care Fariha Goto: [name], [name] Other Clinician: Referring Bhavya Grand: [name] Treating Glorene Leitzke/Extender: Melburn Hake, HOYT Weeks in Treatment: 8 Vital Signs Time Taken: 15:16 Temperature (F): 98.7 Height (in): 60 Pulse (bpm): 109 Weight (lbs): 130 Respiratory Rate (breaths/min): 16 Body Mass Index (BMI): 25.4 Blood Pressure (mmHg): 138/50 Reference Range: 80 - 120 mg / dl Electronic Signature(s) Signed: 07/09/2019 3:54:12 PM By: [name] RCP, RRT, CHT Entered By: [name] on 07/09/2019 15:17:41

## 2019-07-09 NOTE — Progress Notes (Addendum)
MILDA, TOWLES ([id number]) Visit Report for 07/09/2019 Chief Complaint Document Details Patient Name: Joyce [name], [name] Date of Service: 07/09/2019 3:15 PM Medical Record Number: [id number] Patient Account Number: [id number] Date of Birth/Sex: May 04, 1928 (85 y.o. F) Treating RN: Montey Hora Primary Care Provider: [name], [name] Other Clinician: Referring Provider: [name] Treating Provider/Extender: Melburn Hake, Ashaz Robling Weeks in Treatment: 8 Information Obtained from: Patient Chief Complaint Sacral pressure ulcer and bilateral LE lymphedema Electronic Signature(s) Signed: 07/09/2019 3:15:07 PM By: [name] PA-C Entered By: [name] on 07/09/2019 15:15:06 Oley, Hamda L. ([id number]) -------------------------------------------------------------------------------- HPI Details Patient Name: Joyce [name], [name] Date of Service: 07/09/2019 3:15 PM Medical Record Number: [id number] Patient Account Number: [id number] Date of Birth/Sex: 1928-02-26 (85 y.o. F) Treating RN: Montey Hora Primary Care Provider: [name], [name] Other Clinician: Referring Provider: [name] Treating Provider/Extender: Sharalyn Ink in Treatment: 8 History of Present Illness HPI Description: 05/14/19 on evaluation today patient appears to be doing rather well in regard to her lower extremities compared to what it sounds like she was doing not that far back. Fortunately there's no signs of active infection at this time although it does sound like she had significant cellulitis earlier in the year. She does also have a issue with a sacral ulcer upon inspection today which has been present since around February 2020 this is about as long as she's been dealing with her legs as well. She is under palliative care but not hospice. The patient does have a history of scoliosis and kyphosis which makes it difficult for her to lay flat she actually sleep sitting up in a recliner but  night and reclined at this cause her trouble breathing. She also has COPD, hypertension, and bilateral lower should be lymphedema and venous stasis. Currently there's no signs of active cellulitis of the bilateral lower extremities which is good news. The patient has not had any vascular intervention nor evaluation that we have on file. She does spend the majority per day apparently sitting in her recliner upright. This I think is an issue both for her legs as well as for the sacral region unfortunately. She could not tolerate the ABI's today due to the fact that it calls too much pain when we were performing the blood pressure reading on her lower extremities who were not even able to get a proper reading. No fevers, chills, nausea, or vomiting noted at this time. 05/25/19 on evaluation today patient appears to be doing about the same in regard to her sacral wound maybe slightly better compared to previous. Fortunately there's no signs of active infection at this time which is good news in regard to her lower extremities. She has previously had cellulitis therefore were trying to keep an eye on this as far as that's concerned. Nonetheless since I last saw her which was actually roughly 2 weeks ago since due to work her daughter was unable to bring her last week she is not had any care through home health and has not gotten compression stockings for her bilateral lower extremities. Subsequently the issue with home health seems to be multifactorial when they contacted the patient and her daughter in order to get things scheduled they were told according to the daughter that she didn't know why they needed to come out three times a week and wasn't aware that that was what the order was gonna be. The daughter tells me that that somewhat poor timing for her as she works  and doesn't have a lot of time to hang around at the house waiting for home health. With that being said I explained that the only thing  we can really do is either have home health come out and perform the dressing changes or else potentially have a family member do this. There apparently is no family member including herself who is willing to do this she tells me "that she doesn't feel qualified and comfortable". Nonetheless either way the patient has not had a dressing on the sacral area since the last time she was here she uses a pad hasn't really noted much drainage onto the pad although it does appear that the wound itself trapped a lot of the fluid within skin folds. In the end I do think that since she doesn't really have anyone to perform the dressing changes at home we are going to need to utilize the services of home health. They are in agreement with that plan. 06/01/19 on evaluation today patient presents for follow-up concerning her wound in the midline sacral area. This seems to be doing well there is some evidence of new skin growth there still an open sore however it is gonna require dressing. Home health is coming out to see her I think we do need to see about getting her a better schedule where potentially she comes entire clinic on Monday and then on Wednesday and Friday his home health change in her dressing as it stands right now I feel like this is happening to a regular for her. Fortunately there's no signs of any infection at this time her legs are doing well although she does have the compression stockings finally she doesn't actually have those on as of today. They want [place] to show them how to put them on. 06/08/19 on evaluation today patient came in today without any dressing on her sacral region. Apparently this fell off around Friday and unfortunately it was supposed to be reapplied Friday but the patient had an appointment scheduled for 11 o'clock and her home health nurse was supposed to arrive at 47. She was running late and therefore they called and canceled her coming out to perform the dressing change  and subsequently even though they had dressing material at home her daughter refuses to apply the dressing to the wound. For that reason she went all weekend without any dressing until today. She also has gotten the compression stockings but has just use the Tubigrip on occasion although she doesn't even have that on today. Apparently her daughter stating that the compression stockings are too hard to get on and therefore they're not able to do that. Nonetheless as I stated patient came in with nothing on her lower extremities as far as compression is concerned. They do want to see about extending her visits to every two weeks. KIMORAH, SHILLING ([id number]) 07/02/19 on evaluation today patient presents after apparently having been quarantined for 14 days due to having been exposed to someone with Covid-19 Virus during the time that she is been quarantined she has not actually had her dressing changed it all apparently nobody would come around her including her daughter. Nonetheless her daughter hasn't been changing the dressing even prior they did have a home health nurse coming out but it Gillermina Phy may have been about a week's worth of time that the patient actually had home health change in the dressing and even then there was some issues with timing and them actually coming out an appropriate  time that the patient and her daughter felt was within reason. Nonetheless they feel like they had a much better experience with [facility] in the past. 07/09/2019 on evaluation today patient appears to be doing unfortunately somewhat the same in regard to her wound. She really has not had a dressing on since we put one on last week. Then her daughter really is not able to perform the dressing changes the patient tells me that she has a hard time with the location of the wound had been able to perform the dressing change at this site. Nonetheless they also had issues with home health they wanted if we would  initiate home health therapy to have a different company come out. Electronic Signature(s) Signed: 07/09/2019 5:42:52 PM By: [name] PA-C Entered By: [name] on 07/09/2019 17:42:51 Knaak, [place] ([id number]) -------------------------------------------------------------------------------- Physical Exam Details Patient Name: Joyce [name], [name] Date of Service: 07/09/2019 3:15 PM Medical Record Number: [id number] Patient Account Number: [id number] Date of Birth/Sex: 1928-01-06 (85 y.o. F) Treating RN: Montey Hora Primary Care Provider: [name], [name] Other Clinician: Referring Provider: [name], RICHARD Treating Provider/Extender: Melburn Hake, Helmi Hechavarria Weeks in Treatment: 8 Constitutional Well-nourished and well-hydrated in no acute distress. Respiratory normal breathing without difficulty. clear to auscultation bilaterally. Cardiovascular regular rate and rhythm with normal S1, S2. Psychiatric this patient is able to make decisions and demonstrates good insight into disease process. Alert and Oriented x 3. pleasant and cooperative. Notes Upon inspection today patient's wound bed really does not appear to be doing bad at all. There is however redundant tissue that is causing moisture to build up in this area. Unfortunately I am just not seeing quite as much improvement as I would really like to see at this point. We may even need to consider taping the skin away from the opening so that it will keep the area much more dry. That way the dressing will stay in place better. However I will discuss that with the patient at the next visit first we need to get home health involved and settled so that we know that actually someone we will be applying the dressings and then we can talk about more specific orders on exactly how this should be done to get this to heal. Electronic Signature(s) Signed: 07/09/2019 5:43:39 PM By: [name] PA-C Entered By: [name] on  07/09/2019 17:43:39 Evett, Jaydalynn L. ([id number]) -------------------------------------------------------------------------------- Physician Orders Details Patient Name: Beel, Arlene L. Date of Service: 07/09/2019 3:15 PM Medical Record Number: [id number] Patient Account Number: [id number] Date of Birth/Sex: 07-08-1928 (85 y.o. F) Treating RN: Montey Hora Primary Care Provider: [name], [name] Other Clinician: Referring Provider: [name] Treating Provider/Extender: Melburn Hake, Yama Nielson Weeks in Treatment: 8 Verbal / Phone Orders: No Diagnosis Coding ICD-10 Coding Code Description L89.153 Pressure ulcer of sacral region, stage 3 I89.0 Lymphedema, not elsewhere classified I87.2 Venous insufficiency (chronic) (peripheral) I10 Essential (primary) hypertension J44.9 Chronic obstructive pulmonary disease, unspecified Wound Cleansing Wound #1 Midline Sacrum o Dial antibacterial soap, wash wounds, rinse and pat dry prior to dressing wounds o May Shower, gently pat wound dry prior to applying new dressing. Primary Wound Dressing Wound #1 Midline Sacrum o Silver Alginate Secondary Dressing Wound #1 Midline Sacrum o Boardered Foam Dressing Dressing Change Frequency Wound #1 Midline Sacrum o Change Dressing Monday, Wednesday, Friday Follow-up Appointments Wound #1 Midline Sacrum o Return Appointment in 2 weeks. Edema Control o Patient to wear own compression stockings o Elevate legs  to the level of the heart and pump ankles as often as possible Home Health Wound #1 Midline [facility] - Patient requests [facility] Nurse may visit PRN to address patientos wound care needs. o FACE TO FACE ENCOUNTER: MEDICARE and MEDICAID PATIENTS: I certify that this patient is under my care and that I had a face-to-face encounter that meets the physician face-to-face encounter requirements with this patient on this  date. The encounter with the patient was in whole or in part for the following MEDICAL CONDITION: (primary reason for [place]) MEDICAL NECESSITY: I certify, that based on my findings, ALTAIR, POLLMANN ([id number]) NURSING services are a medically necessary home health service. HOME BOUND STATUS: I certify that my clinical findings support that this patient is homebound (i.e., Due to illness or injury, pt requires aid of supportive devices such as crutches, cane, wheelchairs, walkers, the use of special transportation or the assistance of another person to leave their place of residence. There is a normal inability to leave the home and doing so requires considerable and taxing effort. Other absences are for medical reasons / religious services and are infrequent or of short duration when for other reasons). o If current dressing causes regression in wound condition, may D/C ordered dressing product/s and apply Normal Saline Moist Dressing daily until next [place] / Other MD appointment. Morrison of regression in wound condition at [phone]. o Please direct any NON-WOUND related issues/requests for orders to patient's Primary Care Physician Electronic Signature(s) Signed: 07/09/2019 4:03:55 PM By: Montey Hora Signed: 07/09/2019 6:57:28 PM By: [name] PA-C Entered By: Montey Hora on 07/09/2019 15:51:41 Mazzola, Necola L. ([id number]) -------------------------------------------------------------------------------- Problem List Details Patient Name: Dorris, Shilah L. Date of Service: 07/09/2019 3:15 PM Medical Record Number: [id number] Patient Account Number: [id number] Date of Birth/Sex: 1928-09-02 (85 y.o. F) Treating RN: Montey Hora Primary Care Provider: [name], [name] Other Clinician: Referring Provider: [name], [name] Treating Provider/Extender: Sharalyn Ink in Treatment: 8 Active Problems ICD-10 Evaluated  Encounter Code Description Active Date Today Diagnosis L89.153 Pressure ulcer of sacral region, stage 3 05/14/2019 No Yes I89.0 Lymphedema, not elsewhere classified 05/14/2019 No Yes I87.2 Venous insufficiency (chronic) (peripheral) 05/14/2019 No Yes I10 Essential (primary) hypertension 05/14/2019 No Yes J44.9 Chronic obstructive pulmonary disease, unspecified 05/14/2019 No Yes Inactive Problems Resolved Problems Electronic Signature(s) Signed: 07/09/2019 3:15:00 PM By: [name] PA-C Entered By: [name] on 07/09/2019 15:15:00 Velasques, Ahsley L. ([id number]) -------------------------------------------------------------------------------- Progress Note Details Patient Name: Joyce [name], [name] Date of Service: 07/09/2019 3:15 PM Medical Record Number: [id number] Patient Account Number: [id number] Date of Birth/Sex: June 05, 1928 (85 y.o. F) Treating RN: Montey Hora Primary Care Provider: [name], [name] Other Clinician: Referring Provider: [name] Treating Provider/Extender: Sharalyn Ink in Treatment: 8 Subjective Chief Complaint Information obtained from Patient Sacral pressure ulcer and bilateral LE lymphedema History of Present Illness (HPI) 05/14/19 on evaluation today patient appears to be doing rather well in regard to her lower extremities compared to what it sounds like she was doing not that far back. Fortunately there's no signs of active infection at this time although it does sound like she had significant cellulitis earlier in the year. She does also have a issue with a sacral ulcer upon inspection today which has been present since around February 2020 this is about as long as she's been dealing with her legs as well. She is under  palliative care but not hospice. The patient does have a history of scoliosis and kyphosis which makes it difficult for her to lay flat she actually sleep sitting up in a recliner but night and reclined at this cause her  trouble breathing. She also has COPD, hypertension, and bilateral lower should be lymphedema and venous stasis. Currently there's no signs of active cellulitis of the bilateral lower extremities which is good news. The patient has not had any vascular intervention nor evaluation that we have on file. She does spend the majority per day apparently sitting in her recliner upright. This I think is an issue both for her legs as well as for the sacral region unfortunately. She could not tolerate the ABI's today due to the fact that it calls too much pain when we were performing the blood pressure reading on her lower extremities who were not even able to get a proper reading. No fevers, chills, nausea, or vomiting noted at this time. 05/25/19 on evaluation today patient appears to be doing about the same in regard to her sacral wound maybe slightly better compared to previous. Fortunately there's no signs of active infection at this time which is good news in regard to her lower extremities. She has previously had cellulitis therefore were trying to keep an eye on this as far as that's concerned. Nonetheless since I last saw her which was actually roughly 2 weeks ago since due to work her daughter was unable to bring her last week she is not had any care through home health and has not gotten compression stockings for her bilateral lower extremities. Subsequently the issue with home health seems to be multifactorial when they contacted the patient and her daughter in order to get things scheduled they were told according to the daughter that she didn't know why they needed to come out three times a week and wasn't aware that that was what the order was gonna be. The daughter tells me that that somewhat poor timing for her as she works and doesn't have a lot of time to hang around at the house waiting for home health. With that being said I explained that the only thing we can really do is either have home  health come out and perform the dressing changes or else potentially have a family member do this. There apparently is no family member including herself who is willing to do this she tells me "that she doesn't feel qualified and comfortable". Nonetheless either way the patient has not had a dressing on the sacral area since the last time she was here she uses a pad hasn't really noted much drainage onto the pad although it does appear that the wound itself trapped a lot of the fluid within skin folds. In the end I do think that since she doesn't really have anyone to perform the dressing changes at home we are going to need to utilize the services of home health. They are in agreement with that plan. 06/01/19 on evaluation today patient presents for follow-up concerning her wound in the midline sacral area. This seems to be doing well there is some evidence of new skin growth there still an open sore however it is gonna require dressing. Home health is coming out to see her I think we do need to see about getting her a better schedule where potentially she comes entire clinic on Monday and then on Wednesday and Friday his home health change in her dressing as it stands  right now I feel like this is happening to a regular for her. Fortunately there's no signs of any infection at this time her legs are doing well although she does have the compression stockings finally she doesn't actually have those on as of today. They want [place] to show them how to put them on. 06/08/19 on evaluation today patient came in today without any dressing on her sacral region. Apparently this fell off around Friday and unfortunately it was supposed to be reapplied Friday but the patient had an appointment scheduled for 11 o'clock and her home health nurse was supposed to arrive at 20. She was running late and therefore they called and canceled her ADINE, MACEK. ([id number]) coming out to perform the dressing change and  subsequently even though they had dressing material at home her daughter refuses to apply the dressing to the wound. For that reason she went all weekend without any dressing until today. She also has gotten the compression stockings but has just use the Tubigrip on occasion although she doesn't even have that on today. Apparently her daughter stating that the compression stockings are too hard to get on and therefore they're not able to do that. Nonetheless as I stated patient came in with nothing on her lower extremities as far as compression is concerned. They do want to see about extending her visits to every two weeks. 07/02/19 on evaluation today patient presents after apparently having been quarantined for 14 days due to having been exposed to someone with Covid-19 Virus during the time that she is been quarantined she has not actually had her dressing changed it all apparently nobody would come around her including her daughter. Nonetheless her daughter hasn't been changing the dressing even prior they did have a home health nurse coming out but it Gillermina Phy may have been about a week's worth of time that the patient actually had home health change in the dressing and even then there was some issues with timing and them actually coming out an appropriate time that the patient and her daughter felt was within reason. Nonetheless they feel like they had a much better experience with [facility] in the past. 07/09/2019 on evaluation today patient appears to be doing unfortunately somewhat the same in regard to her wound. She really has not had a dressing on since we put one on last week. Then her daughter really is not able to perform the dressing changes the patient tells me that she has a hard time with the location of the wound had been able to perform the dressing change at this site. Nonetheless they also had issues with home health they wanted if we would initiate home health therapy to  have a different company come out. Patient History Information obtained from Patient. Family History Cancer - Child, Diabetes - Siblings, Hypertension - Siblings, Stroke - Mother, No family history of Heart Disease, Kidney Disease, Lung Disease, Seizures, Thyroid Problems, Tuberculosis. Social History Never smoker, Marital Status - Widowed, Alcohol Use - Rarely, Caffeine Use - Daily. Medical History Respiratory Patient has history of Chronic Obstructive Pulmonary Disease (COPD) Integumentary (Skin) Denies history of History of pressure wounds Musculoskeletal Patient has history of Osteoarthritis - hands, knees, sholders Oncologic Denies history of Received Chemotherapy, Received Radiation Review of Systems (ROS) Constitutional Symptoms (General Health) Denies complaints or symptoms of Fatigue, Fever, Chills, Marked Weight Change. Respiratory Denies complaints or symptoms of Chronic or frequent coughs, Shortness of Breath. Cardiovascular Denies complaints or symptoms of Chest  pain, LE edema. Psychiatric Denies complaints or symptoms of Anxiety, Claustrophobia. Bowland, Makila L. ([id number]) Objective Constitutional Well-nourished and well-hydrated in no acute distress. Vitals Time Taken: 3:16 PM, Height: 60 in, Weight: 130 lbs, BMI: 25.4, Temperature: 98.7 F, Pulse: 109 bpm, Respiratory Rate: 16 breaths/min, Blood Pressure: 138/50 mmHg. Respiratory normal breathing without difficulty. clear to auscultation bilaterally. Cardiovascular regular rate and rhythm with normal S1, S2. Psychiatric this patient is able to make decisions and demonstrates good insight into disease process. Alert and Oriented x 3. pleasant and cooperative. General Notes: Upon inspection today patient's wound bed really does not appear to be doing bad at all. There is however redundant tissue that is causing moisture to build up in this area. Unfortunately I am just not seeing quite as much improvement as I  would really like to see at this point. We may even need to consider taping the skin away from the opening so that it will keep the area much more dry. That way the dressing will stay in place better. However I will discuss that with the patient at the next visit first we need to get home health involved and settled so that we know that actually someone we will be applying the dressings and then we can talk about more specific orders on exactly how this should be done to get this to heal. Integumentary (Hair, Skin) Wound #1 status is Open. Original cause of wound was Gradually Appeared. The wound is located on the Midline Sacrum. The wound measures 1cm length x 0.8cm width x 0.5cm depth; 0.628cm^2 area and 0.314cm^3 volume. There is Fat Layer (Subcutaneous Tissue) Exposed exposed. There is undermining starting at 11:00 and ending at 1:00 with a maximum distance of 0.3cm. There is a small amount of serous drainage noted. The wound margin is epibole. There is medium (34- 66%) pink granulation within the wound bed. There is a medium (34-66%) amount of necrotic tissue within the wound bed including Adherent Slough. Assessment Active Problems ICD-10 Pressure ulcer of sacral region, stage 3 Lymphedema, not elsewhere classified Venous insufficiency (chronic) (peripheral) Essential (primary) hypertension Chronic obstructive pulmonary disease, unspecified Plan Wound Cleansing: Neale, Destry L. ([id number]) Wound #1 Midline Sacrum: Dial antibacterial soap, wash wounds, rinse and pat dry prior to dressing wounds May Shower, gently pat wound dry prior to applying new dressing. Primary Wound Dressing: Wound #1 Midline Sacrum: Silver Alginate Secondary Dressing: Wound #1 Midline Sacrum: Boardered Foam Dressing Dressing Change Frequency: Wound #1 Midline Sacrum: Change Dressing Monday, Wednesday, Friday Follow-up Appointments: Wound #1 Midline Sacrum: Return Appointment in 2 weeks. Edema  Control: Patient to wear own compression stockings Elevate legs to the level of the heart and pump ankles as often as possible Home Health: Wound #1 Midline Sacrum: [facility] - Patient requests [place] Nurse may visit PRN to address patient s wound care needs. FACE TO FACE ENCOUNTER: MEDICARE and MEDICAID PATIENTS: I certify that this patient is under my care and that I had a face-to-face encounter that meets the physician face-to-face encounter requirements with this patient on this date. The encounter with the patient was in whole or in part for the following MEDICAL CONDITION: (primary reason for [place]) MEDICAL NECESSITY: I certify, that based on my findings, NURSING services are a medically necessary home health service. HOME BOUND STATUS: I certify that my clinical findings support that this patient is homebound (i.e., Due to illness or injury, pt requires aid of supportive devices such as crutches,  cane, wheelchairs, walkers, the use of special transportation or the assistance of another person to leave their place of residence. There is a normal inability to leave the home and doing so requires considerable and taxing effort. Other absences are for medical reasons / religious services and are infrequent or of short duration when for other reasons). If current dressing causes regression in wound condition, may D/C ordered dressing product/s and apply Normal Saline Moist Dressing daily until next [place] / Other MD appointment. [place] of regression in wound condition at [phone]. Please direct any NON-WOUND related issues/requests for orders to patient's Primary Care Physician 1. Would recommend that we continue with the silver alginate dressing for the time being. With that being said the patient does have these dressings at home so they should be able to apply this themselves. There will be a CNA  there at the home next week who is able to perform the dressing changes while the patient's daughter and son-in-law are out of town. 2. I also think that the patient may require some taping of the skin in order to pull things back so that this will not overlap and cause additional moisture issues. With that being said I got get things under control as far as just getting the dressings changed to begin with before we can further discuss options in this regard. 3. We will see the patient back for a 2-week follow-up as she does not have a way to get her next week. Subsequently we will see where things stand going forward We will see patient back for reevaluation in 2 week here in the clinic. If anything worsens or changes patient will contact our office for additional recommendations. Electronic Signature(s) Signed: 07/09/2019 5:55:58 PM By: [name] PA-C Entered By: [name] on 07/09/2019 17:55:58 Lucero, Tansy L. ([id number]) -------------------------------------------------------------------------------- ROS/PFSH Details Patient Name: Joyce [name], [name] Date of Service: 07/09/2019 3:15 PM Medical Record Number: [id number] Patient Account Number: [id number] Date of Birth/Sex: Mar 29, 1928 (85 y.o. F) Treating RN: Montey Hora Primary Care Provider: [name], [name] Other Clinician: Referring Provider: [name] Treating Provider/Extender: Melburn Hake, Josephanthony Tindel Weeks in Treatment: 8 Information Obtained From Patient Constitutional Symptoms (General Health) Complaints and Symptoms: Negative for: Fatigue; Fever; Chills; Marked Weight Change Respiratory Complaints and Symptoms: Negative for: Chronic or frequent coughs; Shortness of Breath Medical History: Positive for: Chronic Obstructive Pulmonary Disease (COPD) Cardiovascular Complaints and Symptoms: Negative for: Chest pain; LE edema Psychiatric Complaints and Symptoms: Negative for: Anxiety;  Claustrophobia Integumentary (Skin) Medical History: Negative for: History of pressure wounds Musculoskeletal Medical History: Positive for: Osteoarthritis - hands, knees, sholders Oncologic Medical History: Negative for: Received Chemotherapy; Received Radiation Immunizations Pneumococcal Vaccine: Received Pneumococcal Vaccination: Yes Implantable Devices None Family and Social History Joyce [name], Joyce [name] ([id number]) Cancer: Yes - Child; Diabetes: Yes - Siblings; Heart Disease: No; Hypertension: Yes - Siblings; Kidney Disease: No; Lung Disease: No; Seizures: No; Stroke: Yes - Mother; Thyroid Problems: No; Tuberculosis: No; Never smoker; Marital Status - Widowed; Alcohol Use: Rarely; Caffeine Use: Daily Physician Affirmation I have reviewed and agree with the above information. Electronic Signature(s) Signed: 07/09/2019 6:57:28 PM By: [name] PA-C Signed: 07/13/2019 4:33:06 PM By: Montey Hora Entered By: [name] on 07/09/2019 17:43:07 Haymaker, Ermie L. ([id number]) -------------------------------------------------------------------------------- SuperBill Details Patient Name: Joyce [name], [name] Date of Service: 07/09/2019 Medical Record Number: [id number] Patient Account Number: [id number] Date of Birth/Sex: 05-24-1928 (85 y.o. F) Treating RN: Montey Hora Primary Care Provider:  [name] Other Clinician: Referring Provider: [name], [name] Treating Provider/Extender: Melburn Hake, Nia Nathaniel Weeks in Treatment: 8 Diagnosis Coding ICD-10 Codes Code Description L89.153 Pressure ulcer of sacral region, stage 3 I89.0 Lymphedema, not elsewhere classified I87.2 Venous insufficiency (chronic) (peripheral) I10 Essential (primary) hypertension J44.9 Chronic obstructive pulmonary disease, unspecified Facility Procedures CPT4 Code: [id number] Description: [id number] - WOUND CARE VISIT-LEV 3 EST PT Modifier: Quantity: 1 Physician Procedures CPT4 Code:  [id number] Description: [id number] - WC PHYS LEVEL 4 - EST PT ICD-10 Diagnosis Description L89.153 Pressure ulcer of sacral region, stage 3 I89.0 Lymphedema, not elsewhere classified I87.2 Venous insufficiency (chronic) (peripheral) I10 Essential (primary) hypertension Modifier: Quantity: 1 Electronic Signature(s) Signed: 07/09/2019 6:01:19 PM By: [name] PA-C Entered By: [name] on 07/09/2019 18:01:19

## 2019-07-19 ENCOUNTER — Telehealth: Payer: Self-pay

## 2019-07-20 ENCOUNTER — Other Ambulatory Visit: Payer: Self-pay

## 2019-07-20 ENCOUNTER — Ambulatory Visit (INDEPENDENT_AMBULATORY_CARE_PROVIDER_SITE_OTHER): Payer: Medicare Other

## 2019-07-20 DIAGNOSIS — Z23 Encounter for immunization: Secondary | ICD-10-CM | POA: Diagnosis not present

## 2019-07-20 MED ORDER — FLUTICASONE-SALMETEROL 500-50 MCG/DOSE IN AEPB: 1.0000 | INHALATION_SPRAY | Freq: Two times a day (BID) | RESPIRATORY_TRACT | 12 refills | Status: DC

## 2019-07-20 NOTE — Telephone Encounter (Signed)
Telephone call to patient to schedule palliative care visit with patient.

## 2019-07-21 ENCOUNTER — Other Ambulatory Visit: Payer: Self-pay

## 2019-07-21 ENCOUNTER — Telehealth: Payer: Self-pay | Admitting: Family Medicine

## 2019-07-21 ENCOUNTER — Other Ambulatory Visit: Payer: Self-pay | Admitting: Family Medicine

## 2019-07-21 MED ORDER — FLUTICASONE-SALMETEROL 250-50 MCG/DOSE IN AEPB: 1.0000 | INHALATION_SPRAY | Freq: Two times a day (BID) | RESPIRATORY_TRACT | 12 refills | Status: DC

## 2019-07-21 NOTE — Telephone Encounter (Signed)
Telephone call to patient to schedule palliative care visit with patient. Patient/family in agreement with TELEHEALTH visit on 07-23-19 at 9:30 AM.

## 2019-07-21 NOTE — Telephone Encounter (Signed)
Daughter called saying her mom uses Advair 250-50 not 500-50. Please note in her chart.  [facility] will be calling for a update on her dosage  Con [place]

## 2019-07-21 NOTE — Telephone Encounter (Signed)
Corrected

## 2019-07-23 ENCOUNTER — Ambulatory Visit: Payer: Medicare Other | Admitting: Physician Assistant

## 2019-07-23 ENCOUNTER — Other Ambulatory Visit: Payer: Medicare Other

## 2019-07-23 ENCOUNTER — Other Ambulatory Visit: Payer: Self-pay

## 2019-07-23 DIAGNOSIS — Z515 Encounter for palliative care: Secondary | ICD-10-CM

## 2019-07-23 NOTE — Progress Notes (Signed)
COMMUNITY PALLIATIVE CARE SW NOTE  PATIENT NAME: Joyce [name] DOB: [birth year] MRN: [id number]  PRIMARY CARE PROVIDER: Jerrol [name]., MD  RESPONSIBLE PARTY:  Acct ID - Guarantor Home Phone Work Phone Relationship Acct Type  [id number] TAJUANA, [phone]  Self P/F     [address], [place], [place] [address]     PLAN OF CARE and INTERVENTIONS:             1. GOALS OF CARE/ ADVANCE CARE PLANNING:  Goal isfor patientto remain at homewith familyas long as possible. Patient is a DNR,form is in the home. HCPOA is Joyce [name] (patient's daughter). Living Will is complete. 2. SOCIAL/EMOTIONAL/SPIRITUAL ASSESSMENT/ INTERVENTIONS:  SW completed TELEHEALTH visit with patient and Joyce [name]. Patient is doing "fine", denies pain. Joyce [name] notes that patient continues to have redness in and slight swelling in her legs, but Joyce [name] does not think it is cellulitis. Joyce [name] said she continues to encourage patient to put her legs up. Patient also wears light compression socks. No recent falls. Patient's appetite is good. Patient is sleeping fine, does sleep in her recliner. Joyce [name] reports patient has increased coughing, congestion. Joyce [name] plans to have patient start using her nebulizer again. Joyce [name] said they plan to spend more time outside as it becomes cooler and less humid. 3. PATIENT/CAREGIVER EDUCATION/ COPING:  Patient and Joyce [name] are coping well, pleased with patient's progress. Joyce [name] and her husband went on vacation last week and enjoyed this break. 4. PERSONAL EMERGENCY PLAN:  Family will call [phone]. Patient has a Carlos.Due to COVID-19,familyverbalized that patient is remaining at homeother than appointments. 5. COMMUNITY RESOURCES COORDINATION/ HEALTH CARE NAVIGATION:  Patient received her flu shot last week. Patient is no longer going to wound center or receiving home health. Joyce [name] feels that she is managing patient's care, denies concerns. 6. FINANCIAL/LEGAL  CONCERNS/INTERVENTIONS:  None.     SOCIAL HX:  Social History   Tobacco Use  . Smoking status: Never Smoker  . Smokeless tobacco: Never Used  Substance Use Topics  . Alcohol use: Not Currently    CODE STATUS:   Code Status: Prior (DNR) ADVANCED DIRECTIVES: Y MOST FORM COMPLETE: No. HOSPICE EDUCATION PROVIDED: None.  PPS: Patient is able to feed herself and preparesmallmeals. Joyce [name] helps patient with bathing. Patientambulateswithherwalker.  This visit was done via telephone from my office and it was initiated and consent by this patient and/or family. This was a scheduled visit.  I spent108minutes with patient/family, from9:50-10:20aproviding education, support and consultation.   Joyce Lombard, LCSW

## 2019-08-02 ENCOUNTER — Other Ambulatory Visit: Payer: Self-pay

## 2019-08-02 ENCOUNTER — Encounter: Payer: Self-pay | Admitting: Nurse Practitioner

## 2019-08-02 ENCOUNTER — Other Ambulatory Visit: Payer: Medicare Other | Admitting: Nurse Practitioner

## 2019-08-02 DIAGNOSIS — Z515 Encounter for palliative care: Secondary | ICD-10-CM

## 2019-08-02 NOTE — Progress Notes (Signed)
[facility] Palliative Care Consult Note Telephone: [phone]  Fax: [phone]  PATIENT NAME: Joyce [name] DOB: 07-06-1928 MRN: [id number]  PRIMARY CARE PROVIDER:   Jerrol Banana., MD  REFERRING PROVIDER:  Jerrol Banana., MD [address] [place],  [place] [address]  RESPONSIBLE PARTY:   [name] daughter [id number]  Due to the COVID-19 crisis, this visit was done via telemedicine from my office and it was initiated and consent by this patient and or family.  RECOMMENDATIONS and PLAN: 1.ACP: DNR; continue to treat conservatively, treat what is treatable   2.Memory loss  appears progressive. Medical goals to continue to focus on Comfort, redirecting with supportive measures.  3.Anorexia appetite continues to improve. Continue to encourage supplements, frequent meals  4. Palliative care encounter Z51.5; Palliative medicine team will continue to support patient, patient's family, and medical team. Visit consisted of counseling and education dealing with the complex and emotionally intense issues of symptom management and palliative care in the setting of serious and potentially life-threatening illness  I spent 40 minutes providing this consultation,  from 12:00pm to 12:40pm. More than 50% of the time in this consultation was spent coordinating communication.   HISTORY OF PRESENT ILLNESS:  Joyce [name] is a 85 y.o. year old female with multiple medical problems including COPD, enlarged aorta, breast cancer s/p, thyroid cancer s/p thyroidectomy, peptic ulcer disease, hypertension, GERD, asthma, anxiety.Severe kyphosis.I called Ms Valiente's [name] for scheduled telemedicine telephonic palliative care visit. We talked about purpose of visit. We talked about Ms Cristiano has been feeling. [name] endorses Ms.  Amonett has been doing well. She has then ambulatory with her walker. Back to baseline. No recent falls.  Her lower extremities do have times where they are red but no sores or wounds. She does continue to have a small area on her backside half an inch of a wound for which [name] continues to keep dry and dressing. [name] endorses Ms  Pumarejo was going to the wound care center but it was just too much for Ms Difrancesco. Ms. Kimmins did also have [place] coming out to help care for wound. [name] endorses it was not convenient as the nurses would be late or reschedule. She felt like this was more inconvenience and better to dress the wound herself. We talked about options of wound healing including supplements such as vitamin C and Zinc. We talked about supplements that Ms Milosevic does take daily which is a multivitamin for women over 62yrs old. [name] asked about emergency powder and when noted it was for boosting immune system. We talked about nutrition. We talked about foods that do Aid in wound healing such as eggs. We talked about no recent infections, hospitalizations. Goal is to improve quality of life. We talked about role of palliative care and plan of care. No further changes at present time. We talked about follow-up visit in six weeks if needed or sooner should she declined. [name] an agreement questions answered satisfaction. [name] endorses she does have contact information if needed.  Palliative Care was asked to help to continue to address goals of care.   CODE STATUS: DNR  PPS: 40% HOSPICE ELIGIBILITY/DIAGNOSIS: TBD  PAST MEDICAL HISTORY:  Past Medical History:  Diagnosis Date  . Anxiety   . Arthritis   . Asthma   . Asthma   . Breast cancer ([facility])   . Cancer ([facility])   . COPD (chronic obstructive pulmonary disease) (  [facility])   . Enlarged aorta ([facility])   . GERD (gastroesophageal reflux disease)   . High cholesterol   . Hx of skin cancer, basal cell   . Hyperlipemia   . Hypertension   . PUD (peptic ulcer disease)   . Thyroid cancer ([facility])     SOCIAL HX:  Social History   Tobacco Use  .  Smoking status: Never Smoker  . Smokeless tobacco: Never Used  Substance Use Topics  . Alcohol use: Not Currently    ALLERGIES: No Known Allergies   PERTINENT MEDICATIONS:  Outpatient Encounter Medications as of 08/02/2019  Medication Sig  . acetaminophen (TYLENOL) 325 MG tablet Take 650 mg by mouth 2 (two) times daily. Taking two tablets every morning.  Marland Kitchen acidophilus (RISAQUAD) CAPS capsule Take 1 capsule by mouth daily.  Marland Kitchen ADVAIR DISKUS 250-50 MCG/DOSE AEPB INHALE 1 PUFF BY MOUTH TWICE DAILY. RINSE MOUTH WITH WATER AFTER USE TO REDUCE AFTERTASTE AND INCIDENCE OF CANDIDIASIS. DO NOT SWALLOW  . albuterol (PROVENTIL HFA;VENTOLIN HFA) 108 (90 Base) MCG/ACT inhaler Inhale 1-2 puffs into the lungs every 4 (four) hours as needed for wheezing or shortness of breath.  Marland Kitchen albuterol (PROVENTIL) (2.5 MG/3ML) 0.083% nebulizer solution Take 3 mLs (2.5 mg total) by nebulization every 6 (six) hours as needed for wheezing or shortness of breath.  Marland Kitchen alendronate (FOSAMAX) 70 MG tablet Take 1 tablet (70 mg total) by mouth once a week. Take with a full glass of water on an empty stomach.  Marland Kitchen atorvastatin (LIPITOR) 10 MG tablet Take 1 tablet (10 mg total) by mouth daily.  . calcium carbonate (TUMS - DOSED IN MG ELEMENTAL CALCIUM) 500 MG chewable tablet Chew 2 tablets by mouth daily.   . cholecalciferol (VITAMIN D) 1000 units tablet Take 2,000 Units by mouth daily.  . collagenase (SANTYL) ointment Apply topically daily. (Patient not taking: Reported on 06/02/2019)  . DILT-XR 120 MG 24 hr capsule TAKE 2 CAPSULES BY MOUTH EVERY MORNING AND 1 CAPSULE EVERY EVENING  . doxycycline (VIBRA-TABS) 100 MG tablet Take 1 tablet (100 mg total) by mouth 2 (two) times daily. (Patient not taking: Reported on 06/02/2019)  . feeding supplement, ENSURE ENLIVE, (ENSURE ENLIVE) LIQD Take 237 mLs by mouth 2 (two) times daily between meals.  . Fluticasone-Salmeterol (ADVAIR DISKUS) 250-50 MCG/DOSE AEPB Inhale 1 puff into the lungs 2 (two)  times daily.  Marland Kitchen levothyroxine (SYNTHROID, LEVOTHROID) 150 MCG tablet Take 1 tablet (150 mcg total) by mouth daily before breakfast.  . LORazepam (ATIVAN) 0.5 MG tablet Take 0.5 tablets (0.25 mg total) by mouth 3 (three) times daily as needed for anxiety.  . mirtazapine (REMERON) 15 MG tablet Take 1 tablet (15 mg total) by mouth at bedtime.  . Multiple Vitamin (MULTIVITAMIN WITH MINERALS) TABS tablet Take 1 tablet by mouth daily.   No facility-administered encounter medications on file as of 08/02/2019.     PHYSICAL EXAM:   Deferred  [name] Z Gusler, NP

## 2019-08-12 ENCOUNTER — Telehealth: Payer: Self-pay

## 2019-08-12 NOTE — Telephone Encounter (Signed)
Telephone call to patients [name] to schedule palliative care visit.  [name] in agreement with palliative team making visit on Friday 08-20-19 at 10:00 AM.

## 2019-08-18 IMAGING — CR CHEST - 2 VIEW
1 series · 3 of 3 positions shown · non-contrast
Comparison: 01/29/2019

CLINICAL DATA: Chronic shortness of breath, question pneumonia

EXAM:
CHEST - 2 VIEW

[Series 1: dg chest 2 view · 0.14mm/px · 3 of 3 slices shown]
[im 1/3]
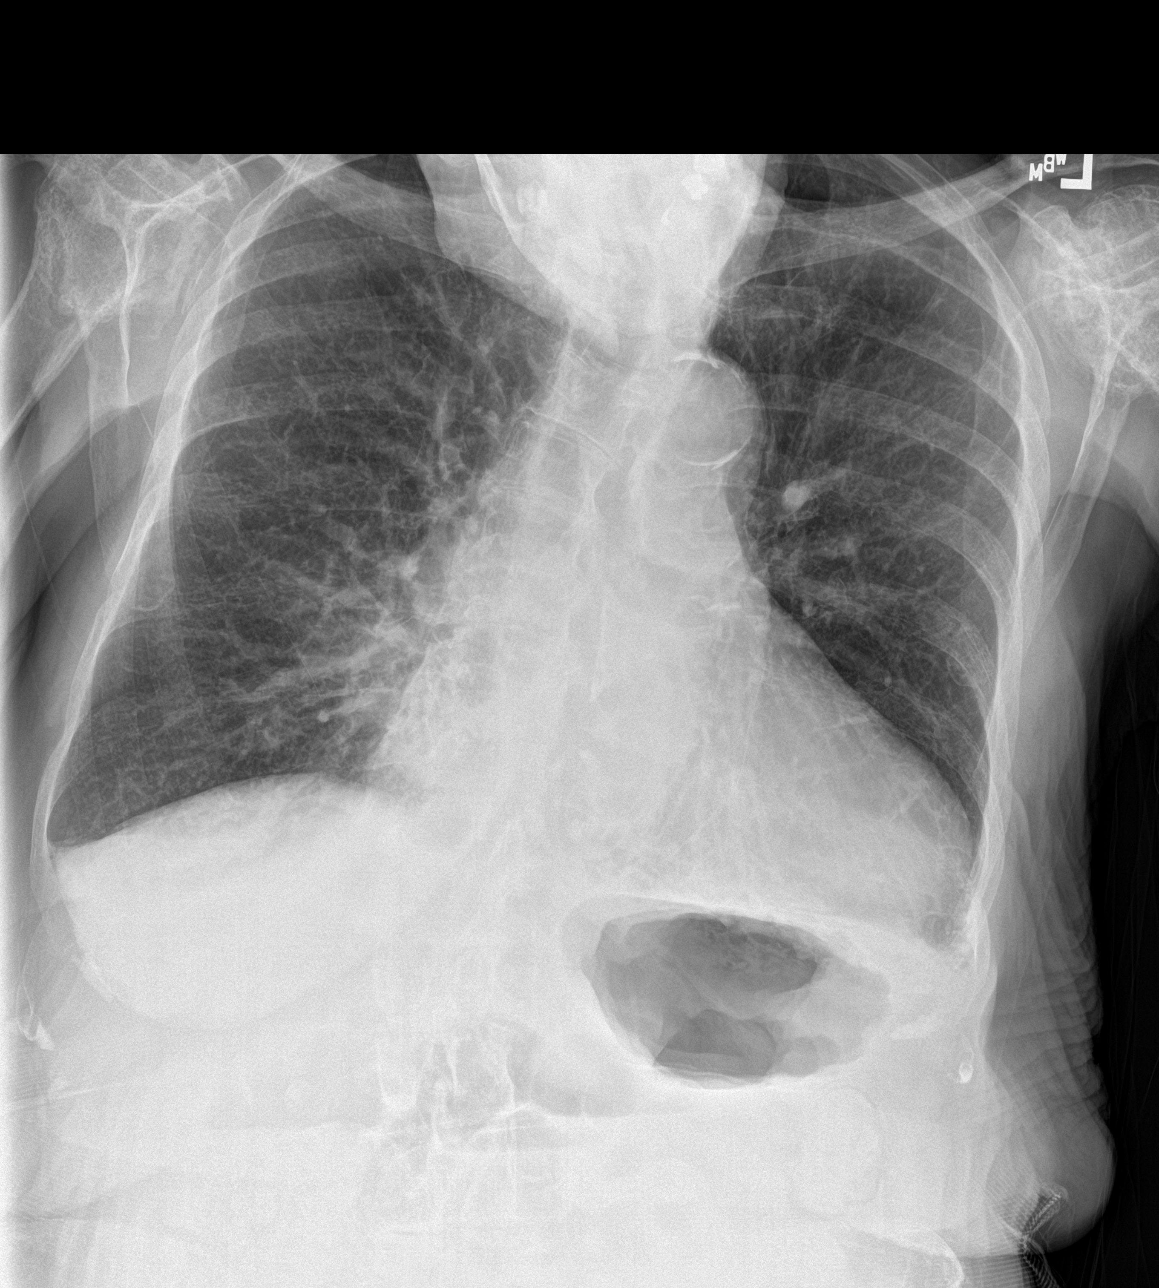
[im 2/3]
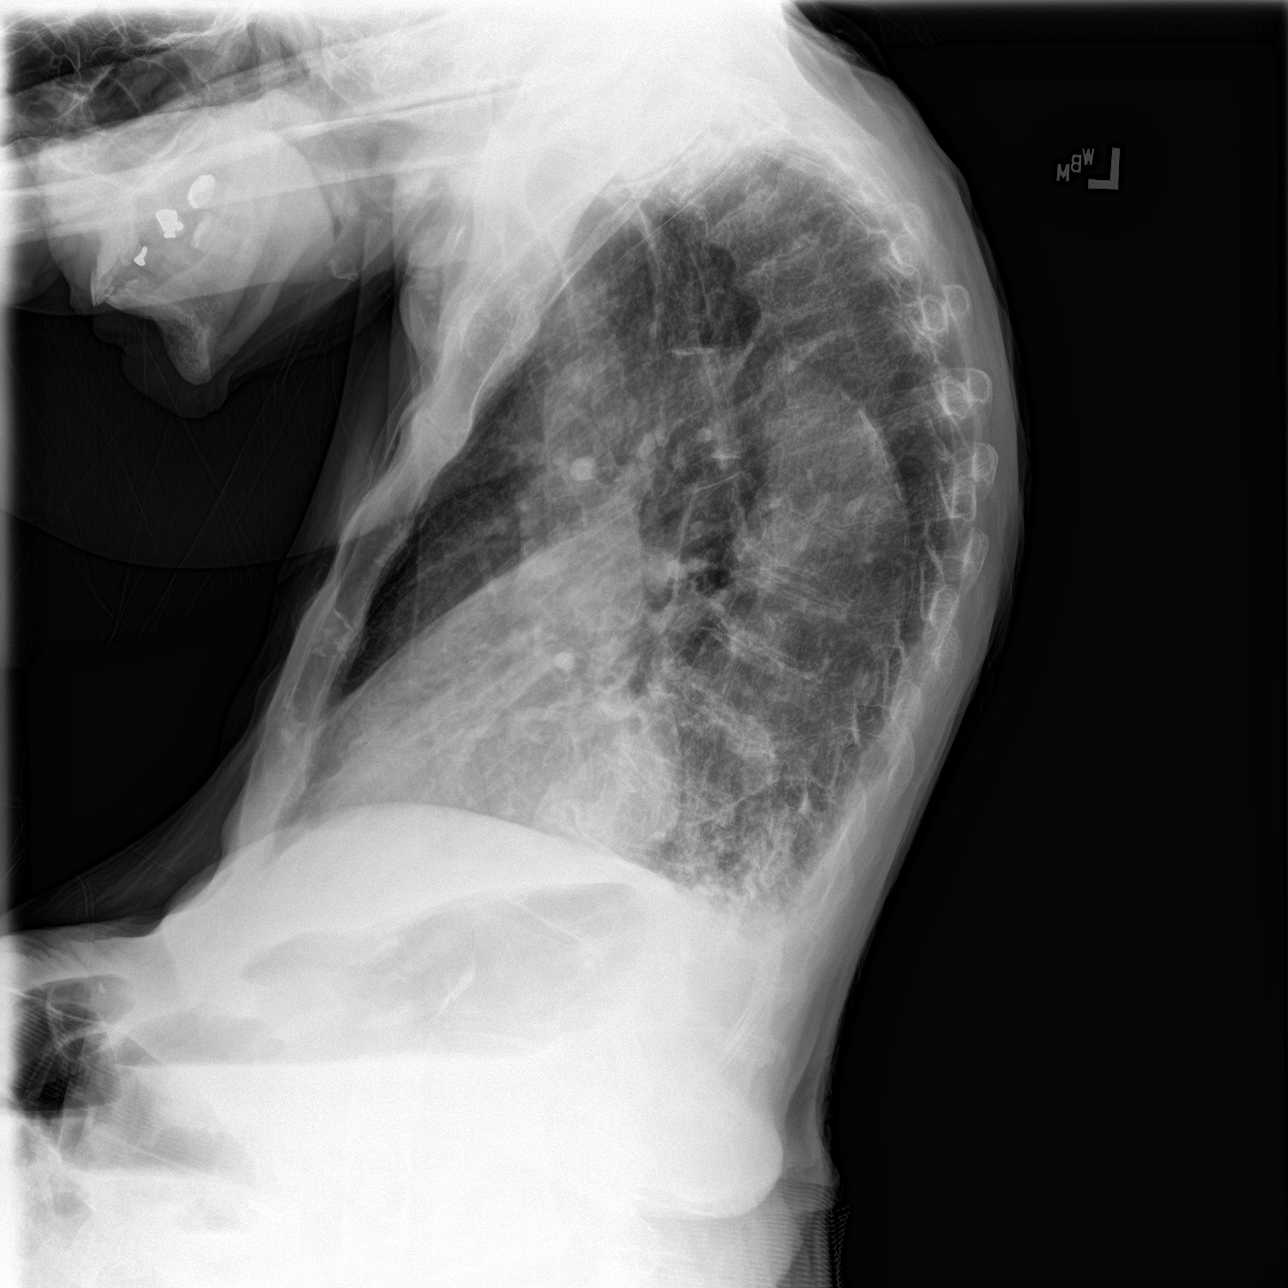
[im 3/3]
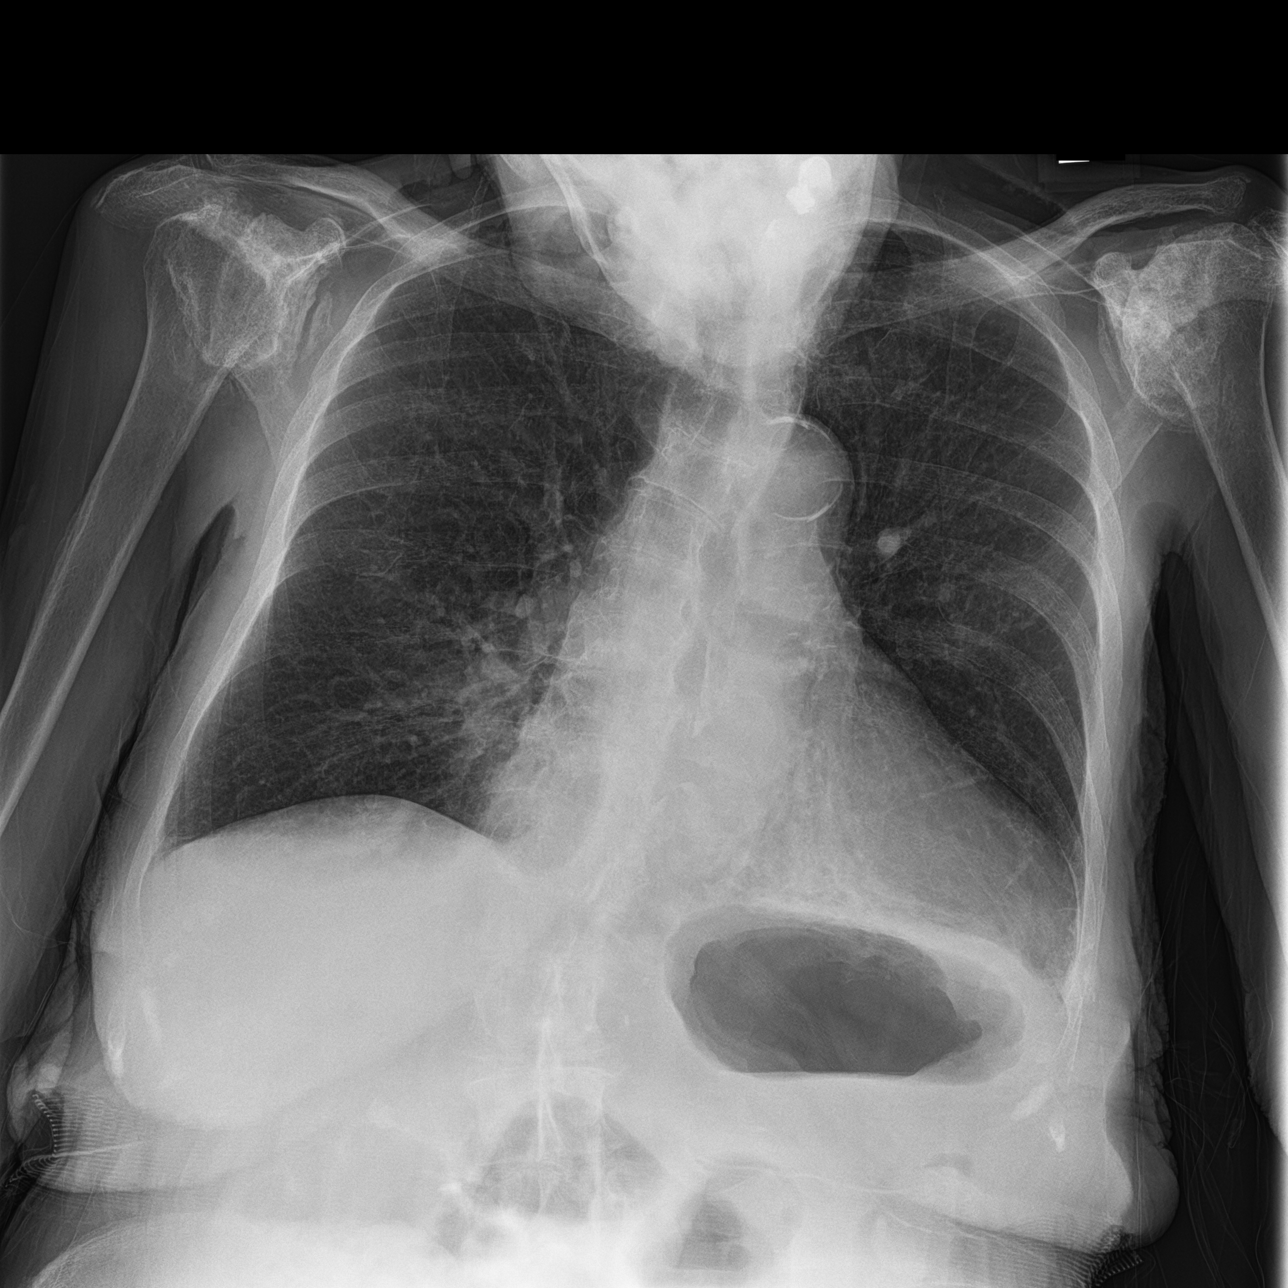

[3 of 3 positions shown; findings below may reference images not displayed]

FINDINGS: Enlargement of cardiac silhouette.

Mediastinal contours and pulmonary vascularity normal.

Atherosclerotic calcification aorta.

Emphysematous and bronchitic changes consistent with COPD.

Subsegmental atelectasis LEFT lower lobe, chronic/unchanged.

Remaining lungs clear.

Tiny pleural effusions blunt the posterior costophrenic angles.

No infiltrate or pneumothorax.

Diffuse osseous demineralization.

Advanced degenerative changes of BILATERAL glenohumeral joints with
chronic posttraumatic deformity of the proximal LEFT humerus.
IMPRESSION: Enlargement of cardiac silhouette.

COPD changes with chronic atelectasis of LEFT lower lobe.

## 2019-08-20 ENCOUNTER — Other Ambulatory Visit: Payer: Medicare Other

## 2019-08-20 ENCOUNTER — Other Ambulatory Visit: Payer: Self-pay

## 2019-08-20 DIAGNOSIS — Z515 Encounter for palliative care: Secondary | ICD-10-CM

## 2019-08-20 NOTE — Progress Notes (Signed)
PATIENT NAME: Joyce [name] DOB: [birth year] MRN: [id number]  PRIMARY CARE PROVIDER: Jerrol Banana., MD  RESPONSIBLE PARTY:  Acct ID - Guarantor Home Phone Work Phone Relationship Acct Type  [id number] CHARMAYNE, [phone]  Self P/F     [address], [place], [place] [address]    PLAN OF CARE and INTERVENTIONS:               1.  GOALS OF CARE/ ADVANCE CARE PLANNING:  Remain in home with daughter and son in law.               2.  PATIENT/CAREGIVER EDUCATION: Education on fall precautions, education on wound care, education on s/s of infection.                  3.  DISEASE STATUS:SW and RN made palliative care home visit. Patient sitting in her recliner in her room watching TV. Patients daughter Joyce [name] in home with patient. Patient informs palliative care team family  returned yesterday evening from a trip to [place]. Patient talked about staying in [place] in the [place]. Daughter states patient is no longer being followed by a home health and is not going to the wound center at the present time. Patient continues to have a stage 2 decubitus between sacral folds.  Nurse provided education to daughter Joyce [name] on wound care and made recommendation to apply hydrocolloid dressing or place Desitin with a foam dressing over area. Daughter instructed to obtain skin prep and paint a picture frame around wound to hold dressing in place. Patient denies pain at the present time. Patient reports her weight this morning was 125 lb. Patient has not seen MD for 6 months. Daughter encouraged to make patient appointment before winter. Patient reports she has received her flu shot. Patient continues to use nebulizer daily. Patient breath sounds continue to remain coarse throughout. Patient reports having a productive cough of cream colored sputum. Patient denies suffering any falls. Patient has 1 + tight edema in feet and lower legs above ankles. Patient has had no weeping to areas per daughter.   Patient reports she continues to sleep in her recliner at night. Patient has remained reluctant to get a hospital bed however nurse has provided education repositioning would help wound heal. Patient continues to ambulate with her rollator walker. Daughter in agreement with patient being followed by nurse practitioner as patient is stable and has improved significantly since [facility] team began seeing patient. Nurse will provide update to nurse practitioner. Daughter encouraged to contact palliative care with questions or concerns.      HISTORY OF PRESENT ILLNESS:  Patient is a 85 year old patient of Dr Marlan Palau.  Patient resides in home with her daughter Joyce [name] and Joyce [name]'s husband.  Patient has been doing well. Daughter in agreement with patient being followed by NP with palliative care as patient has been doing well.  CODE STATUS: DNR  ADVANCED DIRECTIVES: Y  MOST FORM: No PPS: 40%   PHYSICAL EXAM:   VITALS: Today's Vitals   08/20/19 1030  BP: (!) 118/58  Pulse: 90  Resp: 18  Temp: 98.7 F (37.1 C)  TempSrc: Temporal  SpO2: 93%  Weight: 125 lb (56.7 kg)  PainSc: 0-No pain    LUNGS: coarse sounds heard CARDIAC: Cor RRR  EXTREMITIES: 1+ edema SKIN: Skin color, texture, turgor normal. No rashes or lesions  NEURO: positive for gait problems and memory problems       [name],  RN 

## 2019-08-20 NOTE — Progress Notes (Signed)
COMMUNITY PALLIATIVE CARE SW NOTE  PATIENT NAME: Joyce [name] DOB: 1927/11/18 MRN: [id number]  PRIMARY CARE PROVIDER: Jerrol [name]., MD  RESPONSIBLE PARTY:  Acct ID - Guarantor Home Phone Work Phone Relationship Acct Type  [id number] NAYELIE, [id number]  Self P/F     [address] [place], [place] [address]     PLAN OF CARE and INTERVENTIONS:             1. GOALS OF CARE/ ADVANCE CARE PLANNING:  Goal isfor patientto remain at home. Patient is a DNR,form is in the home.HCPOA is Joyce [name] (patient's daughter). Living [name] complete. 2. SOCIAL/EMOTIONAL/SPIRITUAL ASSESSMENT/ INTERVENTIONS:  SW and RN met with patient and Joyce [name] in the home. Patient was smiling, sitting up in chair. Patient denies Patient is sleeping well in her recliner. Patient said she is eating well, enjoys sweets. Patient has congestion, coughing at times. Patient does have a wound on her bottom, RN discussed wound care with [facility]. Patient and family went on a trip to [place] the [place] this past weekend and enjoyed the time away. 3. PATIENT/CAREGIVER EDUCATION/ COPING:  Patient was alert, oriented. Patient states she is forgetful. Patient and Joyce [name] are coping well. No concerns. SW provided supportive counseling, discussed palliative care team and used active and reflective listening. 4. PERSONAL EMERGENCY PLAN:  Family will call [phone]. [place]. COMMUNITY RESOURCES COORDINATION/ HEALTH CARE NAVIGATION:  CTye [place]assists with care coordination. Joyce [name] plans to schedule a PCP appointment soon. Patient is scheduled for a visit with [name], NP in November. 6. FINANCIAL/LEGAL CONCERNS/INTERVENTIONS:  None.     SOCIAL HX:  Social History   Tobacco Use  . Smoking status: Never Smoker  . Smokeless tobacco: Never Used  Substance Use Topics  . Alcohol use: Not Currently    CODE STATUS:   Code Status: Prior (DNR) ADVANCED DIRECTIVES: Y MOST FORM COMPLETE:   No. HOSPICE EDUCATION PROVIDED: None.  PPS: Patient is able to feed herself and preparesmallmeals. CTye Marylandhelps patient with bathing and tolieting. Patientambulateswithherwalker.  Due to patient's stability, patient will no longer be followed by [facility] team at this time. Patient will remain with [place] and be followed by NP. Patient/family agreeable to plan, no questions/concerns. Daughter confirmed that she will contact [facility] team if anything changes.   I spent356mutes with patient/family, [id number], support and consultation.  WhMargaretmary LombardLCSW

## 2019-08-23 ENCOUNTER — Other Ambulatory Visit: Payer: Self-pay | Admitting: Family Medicine

## 2019-08-23 NOTE — Telephone Encounter (Signed)
Pharmacy requesting refills. Thanks!  

## 2019-08-26 ENCOUNTER — Other Ambulatory Visit: Payer: Self-pay | Admitting: Family Medicine

## 2019-08-26 MED ORDER — LEVOTHYROXINE SODIUM 150 MCG PO TABS: 150.0000 ug | ORAL_TABLET | Freq: Every day | ORAL | 12 refills | Status: DC

## 2019-08-26 NOTE — Telephone Encounter (Signed)
[facility] faxed refill request for the following medications:  levothyroxine (SYNTHROID, LEVOTHROID) 150 MCG tablet    Please advise.

## 2019-08-26 NOTE — Telephone Encounter (Signed)
Same dose for now. Repeat TSH next time she is seen.

## 2019-08-26 NOTE — Telephone Encounter (Signed)
Medication was sent into the pharmacy.  

## 2019-08-26 NOTE — Telephone Encounter (Signed)
Please advise refill? Patient had TSH lab done at [facility] 01/29/2019, but no changes were made to dose. Ok to fill?

## 2019-09-13 ENCOUNTER — Other Ambulatory Visit: Payer: Medicare Other | Admitting: Nurse Practitioner

## 2019-09-15 ENCOUNTER — Ambulatory Visit (INDEPENDENT_AMBULATORY_CARE_PROVIDER_SITE_OTHER): Payer: Medicare Other | Admitting: Family Medicine

## 2019-09-15 ENCOUNTER — Telehealth: Payer: Self-pay

## 2019-09-15 ENCOUNTER — Other Ambulatory Visit: Payer: Self-pay

## 2019-09-15 ENCOUNTER — Ambulatory Visit
Admission: RE | Admit: 2019-09-15 | Discharge: 2019-09-15 | Disposition: A | Discharge status: Home / Self Care | Payer: Medicare Other | Source: Ambulatory Visit | Attending: Family Medicine | Admitting: Family Medicine

## 2019-09-15 VITALS — BP 133/79 | HR 106 | Temp 97.5°F | Resp 20 | Ht 60.0 in | Wt 122.0 lb

## 2019-09-15 DIAGNOSIS — M810 Age-related osteoporosis without current pathological fracture: Secondary | ICD-10-CM | POA: Diagnosis not present

## 2019-09-15 DIAGNOSIS — J479 Bronchiectasis, uncomplicated: Secondary | ICD-10-CM | POA: Diagnosis not present

## 2019-09-15 DIAGNOSIS — E210 Primary hyperparathyroidism: Secondary | ICD-10-CM

## 2019-09-15 DIAGNOSIS — I10 Essential (primary) hypertension: Secondary | ICD-10-CM

## 2019-09-15 DIAGNOSIS — K219 Gastro-esophageal reflux disease without esophagitis: Secondary | ICD-10-CM

## 2019-09-15 DIAGNOSIS — C73 Malignant neoplasm of thyroid gland: Secondary | ICD-10-CM | POA: Diagnosis not present

## 2019-09-15 DIAGNOSIS — R05 Cough: Secondary | ICD-10-CM | POA: Diagnosis not present

## 2019-09-15 NOTE — Telephone Encounter (Signed)
Called [name]) to get more info as to why she was being seen today. She scheduled a mychart visit with "Lungs" as the description. Wanted to make sure this did not need to be a telephone visit instead. Left message to call back.

## 2019-09-15 NOTE — Telephone Encounter (Signed)
Patient being seen in the office now for routine follow up.

## 2019-09-15 NOTE — Progress Notes (Signed)
Patient: Joyce [name] Female    DOB: 23-Jun-1928   85 y.o.   MRN: [id number] Visit Date: 09/15/2019  Today's Provider: [name], MD   Chief Complaint  Patient presents with  . Follow-up  . COPD  . Hyperlipidemia  . Cough   Subjective:   HPI    Lipid/Cholesterol, Follow-up:   Last seen for this4 months ago.  Management changes since that visit include no changes. . Last Lipid Panel: No results found for: CHOL, TRIG, HDL, CHOLHDL, VLDL, LDLCALC, LDLDIRECT  She reports good compliance with treatment. She is not having side effects.    Current symptoms include weight loss.. Weight trend: fluctuating a bit Prior visit with dietician: no Current diet: in general, a "healthy" diet   Current exercise: walking  Wt Readings from Last 3 Encounters:  09/15/19 122 lb (55.3 kg)  08/20/19 125 lb (56.7 kg)  06/08/19 130 lb (59 kg)    Hypothyroidism, follow up: Patient was last seen for this 4 months ago. No medications were changed since last visit. She is currently taking levothyroxine 18mg daily, and reports good compliance and good symptom control.  Lab Results  Component Value Date   TSH 6.213 (H) 01/29/2019   COPD, follow up: Patient was last seen for this 4 months ago. No medications were changed since last visit.  She feels pretty stable lately No Known Allergies   Current Outpatient Medications:  .  acetaminophen (TYLENOL) 325 MG tablet, Take 650 mg by mouth 2 (two) times daily. Taking two tablets every morning., Disp: , Rfl:  .  acidophilus (RISAQUAD) CAPS capsule, Take 1 capsule by mouth daily., Disp: , Rfl:  .  ADVAIR DISKUS 250-50 MCG/DOSE AEPB, INHALE 1 PUFF BY MOUTH TWICE DAILY. RINSE MOUTH WITH WATER AFTER USE TO REDUCE AFTERTASTE AND INCIDENCE OF CANDIDIASIS. DO NOT SWALLOW, Disp: 180 each, Rfl: 3 .  albuterol (PROVENTIL HFA;VENTOLIN HFA) 108 (90 Base) MCG/ACT inhaler, Inhale 1-2 puffs into the lungs every 4 (four) hours as needed for  wheezing or shortness of breath., Disp: 1 Inhaler, Rfl: 0 .  albuterol (PROVENTIL) (2.5 MG/3ML) 0.083% nebulizer solution, Take 3 mLs (2.5 mg total) by nebulization every 6 (six) hours as needed for wheezing or shortness of breath., Disp: 75 mL, Rfl: 12 .  alendronate (FOSAMAX) 70 MG tablet, TAKE 1 TABLET BY MOUTH ONCE A WEEK WITH A FULL GLASS OF WATER ON AN EMPTY STOMACH, Disp: 4 tablet, Rfl: 12 .  atorvastatin (LIPITOR) 10 MG tablet, TAKE 1 TABLET BY MOUTH DAILY, Disp: 90 tablet, Rfl: 3 .  calcium carbonate (TUMS - DOSED IN MG ELEMENTAL CALCIUM) 500 MG chewable tablet, Chew 2 tablets by mouth daily. , Disp: , Rfl:  .  cholecalciferol (VITAMIN D) 1000 units tablet, Take 2,000 Units by mouth daily., Disp: , Rfl:  .  DILT-XR 120 MG 24 hr capsule, TAKE 2 CAPSULES BY MOUTH EVERY MORNING AND 1 CAPSULE EVERY EVENING, Disp: 270 capsule, Rfl: 2 .  feeding supplement, ENSURE ENLIVE, (ENSURE ENLIVE) LIQD, Take 237 mLs by mouth 2 (two) times daily between meals., Disp: 237 mL, Rfl: 12 .  Fluticasone-Salmeterol (ADVAIR DISKUS) 250-50 MCG/DOSE AEPB, Inhale 1 puff into the lungs 2 (two) times daily., Disp: 60 each, Rfl: 12 .  levothyroxine (SYNTHROID) 150 MCG tablet, Take 1 tablet (150 mcg total) by mouth daily before breakfast., Disp: 30 tablet, Rfl: 12 .  LORazepam (ATIVAN) 0.5 MG tablet, Take 0.5 tablets (0.25 mg total) by mouth 3 (three)  times daily as needed for anxiety., Disp: 90 tablet, Rfl: 3 .  mirtazapine (REMERON) 15 MG tablet, Take 1 tablet (15 mg total) by mouth at bedtime., Disp: 30 tablet, Rfl: 12 .  Multiple Vitamin (MULTIVITAMIN WITH MINERALS) TABS tablet, Take 1 tablet by mouth daily., Disp: , Rfl:   Review of Systems  Constitutional: Negative for chills and fatigue.  Eyes: Negative.   Respiratory: Positive for shortness of breath. Negative for cough.        Has COPD  Cardiovascular: Positive for leg swelling. Negative for chest pain and palpitations.  Endocrine: Negative.    Musculoskeletal: Negative for arthralgias and myalgias.  Allergic/Immunologic: Negative.   Neurological: Negative for dizziness, light-headedness and headaches.  Hematological: Negative.   Psychiatric/Behavioral: Negative for agitation, self-injury, sleep disturbance and suicidal ideas. The patient is not nervous/anxious.     Social History   Tobacco Use  . Smoking status: Never Smoker  . Smokeless tobacco: Never Used  Substance Use Topics  . Alcohol use: Not Currently      Objective:   BP 133/79   Pulse (!) 106   Temp (!) 97.5 F (36.4 C) (Temporal)   Resp 20   Ht 5' (1.524 m)   Wt 122 lb (55.3 kg)   SpO2 91%   BMI 23.83 kg/m  Vitals:   09/15/19 1546  BP: 133/79  Pulse: (!) 106  Resp: 20  Temp: (!) 97.5 F (36.4 C)  TempSrc: Temporal  SpO2: 91%  Weight: 122 lb (55.3 kg)  Height: 5' (1.524 m)  Body mass index is 23.83 kg/m.   Physical Exam Vitals signs reviewed.  Constitutional:      Appearance: She is not toxic-appearing.  HENT:     Head: Normocephalic and atraumatic.     Right Ear: External ear normal.     Left Ear: External ear normal.  Eyes:     General: No scleral icterus.    Conjunctiva/sclera: Conjunctivae normal.  Cardiovascular:     Rate and Rhythm: Normal rate and regular rhythm.     Heart sounds: Normal heart sounds.  Pulmonary:     Effort: Pulmonary effort is normal.     Breath sounds: Rhonchi present.     Comments: Left greater than right rhonchi. Abdominal:     Palpations: Abdomen is soft.  Skin:    General: Skin is warm and dry.     Findings: Erythema present.     Comments: Mildly erythematous and indurated skin of right greater than left lower leg.The induration is more striking than erythema and there is no tenderness or drainage.  Neurological:     General: No focal deficit present.     Mental Status: She is alert and oriented to person, place, and time. Mental status is at baseline.  Psychiatric:        Mood and Affect: Mood  normal.        Behavior: Behavior normal.        Thought Content: Thought content normal.        Judgment: Judgment normal.      No results found for any visits on 09/15/19.     Assessment & Plan     1. Essential hypertension On diltiazem. - CBC with Diff - Comp Met (CMET)  2. Bronchiectasis without acute exacerbation ([place]) Clinically challenging for pt. Check CXR with cough and abnormal exam. - Comp Met (CMET) - DG Chest 2 View  3. Thyroid cancer ([place])  - TSH  4. Gastroesophageal reflux disease without  esophagitis   5. Primary hyperparathyroidism ([place]) Labs today.  6. Papillary carcinoma of thyroid ([place])   7. Age-related osteoporosis without current pathological fracture More than 50% 25 minute visit spent in counseling or coordination of care      [name], MD  [place]

## 2019-09-16 ENCOUNTER — Telehealth: Payer: Self-pay

## 2019-09-16 LAB — COMPREHENSIVE METABOLIC PANEL
ALT: 14 IU/L (ref 0–32)
AST: 20 IU/L (ref 0–40)
Albumin/Globulin Ratio: 1.7 (ref 1.2–2.2)
Albumin: 4.3 g/dL (ref 3.6–4.6)
Alkaline Phosphatase: 98 IU/L (ref 39–117)
BUN/Creatinine Ratio: 29 — ABNORMAL HIGH (ref 12–28)
BUN: 17 mg/dL (ref 8–27)
Bilirubin Total: 0.4 mg/dL (ref 0.0–1.2)
CO2: 27 mmol/L (ref 20–29)
Calcium: 9.8 mg/dL (ref 8.7–10.3)
Chloride: 96 mmol/L (ref 96–106)
Creatinine, Ser: 0.58 mg/dL (ref 0.57–1.00)
GFR calc Af Amer: 95 mL/min/{1.73_m2} (ref >59)
GFR calc non Af Amer: 82 mL/min/{1.73_m2} (ref >59)
Globulin, Total: 2.6 g/dL (ref 1.5–4.5)
Glucose: 95 mg/dL (ref 65–99)
Potassium: 3.7 mmol/L (ref 3.5–5.2)
Sodium: 143 mmol/L (ref 134–144)
Total Protein: 6.9 g/dL (ref 6.0–8.5)

## 2019-09-16 LAB — CBC WITH DIFFERENTIAL/PLATELET
Basophils Absolute: 0.1 10*3/uL (ref 0.0–0.2)
Basos: 1 %
EOS (ABSOLUTE): 0.1 10*3/uL (ref 0.0–0.4)
Eos: 0 %
Hematocrit: 36.9 % (ref 34.0–46.6)
Hemoglobin: 12.3 g/dL (ref 11.1–15.9)
Immature Grans (Abs): 0 10*3/uL (ref 0.0–0.1)
Immature Granulocytes: 0 %
Lymphocytes Absolute: 2.9 10*3/uL (ref 0.7–3.1)
Lymphs: 17 %
MCH: 26.3 pg — ABNORMAL LOW (ref 26.6–33.0)
MCHC: 33.3 g/dL (ref 31.5–35.7)
MCV: 79 fL (ref 79–97)
Monocytes Absolute: 1 10*3/uL — ABNORMAL HIGH (ref 0.1–0.9)
Monocytes: 6 %
Neutrophils Absolute: 13 10*3/uL — ABNORMAL HIGH (ref 1.4–7.0)
Neutrophils: 76 %
Platelets: 352 10*3/uL (ref 150–450)
RBC: 4.68 x10E6/uL (ref 3.77–5.28)
RDW: 14.5 % (ref 11.7–15.4)
WBC: 17.2 10*3/uL — ABNORMAL HIGH (ref 3.4–10.8)

## 2019-09-16 LAB — TSH: TSH: 0.272 u[IU]/mL — ABNORMAL LOW (ref 0.450–4.500)

## 2019-09-16 MED ORDER — LEVOFLOXACIN 500 MG PO TABS: 500.0000 mg | ORAL_TABLET | Freq: Every day | ORAL | 0 refills | Status: AC

## 2019-09-16 MED ORDER — LEVOTHYROXINE SODIUM 100 MCG PO TABS: 100.0000 ug | ORAL_TABLET | Freq: Every day | ORAL | 1 refills | Status: DC

## 2019-09-16 NOTE — Telephone Encounter (Signed)
Notified patients [name] and sent in both prescriptions to [facility] in [place]

## 2019-09-16 NOTE — Telephone Encounter (Signed)
-----   Message from Jerrol Banana., MD sent at 09/16/2019  9:29 AM EST ----- Current levothyroxine dose from 150 to 100 mcg daily.  White count elevated that could mean pulmonary infection.  Try Levaquin 500 mg daily for 3 days.

## 2019-09-16 NOTE — Telephone Encounter (Signed)
-----   Message from Jerrol Banana., MD sent at 09/16/2019  9:26 AM EST ----- Stable CXR.

## 2019-09-23 ENCOUNTER — Encounter: Payer: Self-pay | Admitting: Nurse Practitioner

## 2019-09-23 ENCOUNTER — Other Ambulatory Visit: Payer: Medicare Other | Admitting: Nurse Practitioner

## 2019-09-23 ENCOUNTER — Other Ambulatory Visit: Payer: Self-pay

## 2019-09-23 DIAGNOSIS — Z515 Encounter for palliative care: Secondary | ICD-10-CM

## 2019-09-23 DIAGNOSIS — J449 Chronic obstructive pulmonary disease, unspecified: Secondary | ICD-10-CM | POA: Diagnosis not present

## 2019-09-23 NOTE — Progress Notes (Signed)
[facility] Palliative Care Consult Note Telephone: [phone]  Fax: [phone]  PATIENT NAME: Joyce [name] DOB: [birth year] MRN: [id number]  PRIMARY CARE PROVIDER:   Jerrol Banana., MD  REFERRING PROVIDER:  Jerrol Banana., MD [address] [place],  [place] [address]  RESPONSIBLE PARTY:   [name] [id number]  Due to the COVID-19 crisis, this visit was done via telemedicine from my office and it was initiated and consent by this patient and or family.  RECOMMENDATIONS and PLAN: 1.ACP: DNR; continue to treat conservatively, treat what is treatable   2.Memory loss  appears progressive. Medical goals to continue to focus on Comfort, redirecting with supportive measures.  3.Anorexia appetitecontinues to improve. Continue to encourage supplements, frequent meals  4. Palliative care encounter ; Palliative medicine team will continue to support patient, patient's family, and medical team. Visit consisted of counseling and education dealing with the complex and emotionally intense issues of symptom management and palliative care in the setting of serious and potentially life-threatening illness  I spent 35 minutes providing this consultation,  from 3:00pm to 3:35pm. More than 50% of the time in this consultation was spent coordinating communication.   HISTORY OF PRESENT ILLNESS:  Joyce [name] is a 85 y.o. year old female with multiple medical problems including COPD, enlarged aorta, breast cancer s/p, thyroid cancer s/p thyroidectomy, peptic ulcer disease, hypertension, GERD, asthma, anxiety.Severe kyphosis . I called Joyce [name], Joyce [name] for scheduled telemedicine telephonic palliative care visit. Joyce [name] in agreement. We talked about how Joyce [name] has been feeling. Joyce [name] endorses she is been doing fairly well. She had a recent appointment with Dr Rosanna Randy for which she felt that she got a good report.  She did get put on an antibiotic for bronchiolitis. Also she did have an eight pound weight loss, thyroid medicine adjusted. We talked about chronic disease, medical goals. We talked about her appetite, with weight loss, medications. Joyce [name] endorses overall she feels like she is doing much better than previously. We talked about role of palliative care and plan of care. Therapeutic listening emotional support provided. Questions answered. Discussed will schedule follow-up palliative care visit in 6 weeks if needed or sooner should she continue to lose more weight. Joyce [name] in agreement feels at this point in time she is stable. Contact information  Palliative Care was asked to help to continue to address goals of care.   CODE STATUS: DNR  PPS: 40% HOSPICE ELIGIBILITY/DIAGNOSIS: TBD  PAST MEDICAL HISTORY:  Past Medical History:  Diagnosis Date   Anxiety    Arthritis    Asthma    Asthma    Breast cancer ([place])    Cancer (HCC)    COPD (chronic obstructive pulmonary disease) (HCC)    Enlarged aorta (HCC)    GERD (gastroesophageal reflux disease)    High cholesterol    Hx of skin cancer, basal cell    Hyperlipemia    Hypertension    PUD (peptic ulcer disease)    Thyroid cancer ([place])     SOCIAL HX:  Social History   Tobacco Use   Smoking status: Never Smoker   Smokeless tobacco: Never Used  Substance Use Topics   Alcohol use: Not Currently    ALLERGIES: No Known Allergies   PERTINENT MEDICATIONS:  Outpatient Encounter Medications as of 09/23/2019  Medication Sig   acetaminophen (TYLENOL) 325 MG tablet Take 650 mg by mouth 2 (two) times daily. Taking two tablets every  morning.   acidophilus (RISAQUAD) CAPS capsule Take 1 capsule by mouth daily.   ADVAIR DISKUS 250-50 MCG/DOSE AEPB INHALE 1 PUFF BY MOUTH TWICE DAILY. RINSE MOUTH WITH WATER AFTER USE TO REDUCE AFTERTASTE AND INCIDENCE OF CANDIDIASIS. DO NOT SWALLOW   albuterol (PROVENTIL HFA;VENTOLIN HFA) 108 (90  Base) MCG/ACT inhaler Inhale 1-2 puffs into the lungs every 4 (four) hours as needed for wheezing or shortness of breath.   albuterol (PROVENTIL) (2.5 MG/3ML) 0.083% nebulizer solution Take 3 mLs (2.5 mg total) by nebulization every 6 (six) hours as needed for wheezing or shortness of breath.   alendronate (FOSAMAX) 70 MG tablet TAKE 1 TABLET BY MOUTH ONCE A WEEK WITH A FULL GLASS OF WATER ON AN EMPTY STOMACH   atorvastatin (LIPITOR) 10 MG tablet TAKE 1 TABLET BY MOUTH DAILY   calcium carbonate (TUMS - DOSED IN MG ELEMENTAL CALCIUM) 500 MG chewable tablet Chew 2 tablets by mouth daily.    cholecalciferol (VITAMIN D) 1000 units tablet Take 2,000 Units by mouth daily.   DILT-XR 120 MG 24 hr capsule TAKE 2 CAPSULES BY MOUTH EVERY MORNING AND 1 CAPSULE EVERY EVENING   feeding supplement, ENSURE ENLIVE, (ENSURE ENLIVE) LIQD Take 237 mLs by mouth 2 (two) times daily between meals.   Fluticasone-Salmeterol (ADVAIR DISKUS) 250-50 MCG/DOSE AEPB Inhale 1 puff into the lungs 2 (two) times daily.   levofloxacin (LEVAQUIN) 500 MG tablet Take 1 tablet (500 mg total) by mouth daily.   levothyroxine (SYNTHROID) 100 MCG tablet Take 1 tablet (100 mcg total) by mouth daily.   LORazepam (ATIVAN) 0.5 MG tablet Take 0.5 tablets (0.25 mg total) by mouth 3 (three) times daily as needed for anxiety.   mirtazapine (REMERON) 15 MG tablet Take 1 tablet (15 mg total) by mouth at bedtime.   Multiple Vitamin (MULTIVITAMIN WITH MINERALS) TABS tablet Take 1 tablet by mouth daily.   No facility-administered encounter medications on file as of 09/23/2019.     PHYSICAL EXAM:   Deferred Takashi Korol Z Dawsen Krieger, NP

## 2019-10-06 ENCOUNTER — Encounter: Payer: Self-pay | Admitting: Family Medicine

## 2019-10-07 ENCOUNTER — Telehealth: Payer: Self-pay

## 2019-10-07 NOTE — Telephone Encounter (Signed)
Copied from [place] [id number]. Topic: Appointment Scheduling - Scheduling Inquiry for Clinic >> Oct 07, 2019  1:10 PM Scherrie Gerlach wrote: Reason for CRM: daughter following up on mychart message sent to Dr Rosanna Randy yesterday.  Dr Rosanna Randy had wanted pt to see Sharyn Lull for her issue. However, no one called the pt.  I have scheduled the pt with Sharyn Lull for Friday at 9:20.  Dr Rosanna Randy was to let [place] know as well.

## 2019-10-08 ENCOUNTER — Ambulatory Visit (INDEPENDENT_AMBULATORY_CARE_PROVIDER_SITE_OTHER): Payer: Medicare Other | Admitting: Adult Health

## 2019-10-08 ENCOUNTER — Encounter: Payer: Self-pay | Admitting: Adult Health

## 2019-10-08 ENCOUNTER — Other Ambulatory Visit: Payer: Self-pay

## 2019-10-08 VITALS — BP 124/70 | HR 60 | Temp 98.1°F | Resp 15 | Wt 124.0 lb

## 2019-10-08 DIAGNOSIS — S90425A Blister (nonthermal), left lesser toe(s), initial encounter: Secondary | ICD-10-CM | POA: Diagnosis not present

## 2019-10-08 DIAGNOSIS — T148XXA Other injury of unspecified body region, initial encounter: Secondary | ICD-10-CM | POA: Diagnosis not present

## 2019-10-08 DIAGNOSIS — L089 Local infection of the skin and subcutaneous tissue, unspecified: Secondary | ICD-10-CM

## 2019-10-08 DIAGNOSIS — R0989 Other specified symptoms and signs involving the circulatory and respiratory systems: Secondary | ICD-10-CM | POA: Diagnosis not present

## 2019-10-08 MED ORDER — CLOTRIMAZOLE 1 % EX CREA: 1.0000 "application " | TOPICAL_CREAM | Freq: Two times a day (BID) | CUTANEOUS | 0 refills | Status: AC

## 2019-10-08 MED ORDER — DOXYCYCLINE HYCLATE 100 MG PO TABS: 100.0000 mg | ORAL_TABLET | Freq: Two times a day (BID) | ORAL | 0 refills | Status: AC

## 2019-10-08 MED ORDER — MUPIROCIN 2 % EX OINT: 1.0000 "application " | TOPICAL_OINTMENT | Freq: Two times a day (BID) | CUTANEOUS | 0 refills | Status: AC

## 2019-10-08 NOTE — Patient Instructions (Signed)
Doxycycline tablets or capsules What is this medicine? DOXYCYCLINE (dox i SYE kleen) is a tetracycline antibiotic. It kills certain bacteria or stops their growth. It is used to treat many kinds of infections, like dental, skin, respiratory, and urinary tract infections. It also treats acne, Lyme disease, malaria, and certain sexually transmitted infections. This medicine may be used for other purposes; ask your health care provider or pharmacist if you have questions. COMMON BRAND NAME(S): Acticlate, Adoxa, Adoxa CK, Adoxa Pak, Adoxa TT, Alodox, Avidoxy, Doxal, LYMEPAK, Mondoxyne NL, Monodox, Morgidox 1x, Morgidox 1x Kit, Morgidox 2x, Morgidox 2x Kit, NutriDox, Ocudox, TARGADOX, Vibra-Tabs, Vibramycin What should I tell my health care provider before I take this medicine? They need to know if you have any of these conditions:  liver disease  long exposure to sunlight like working outdoors  stomach problems like colitis  an unusual or allergic reaction to doxycycline, tetracycline antibiotics, other medicines, foods, dyes, or preservatives  pregnant or trying to get pregnant  breast-feeding How should I use this medicine? Take this medicine by mouth with a full glass of water. Follow the directions on the prescription label. It is best to take this medicine without food, but if it upsets your stomach take it with food. Take your medicine at regular intervals. Do not take your medicine more often than directed. Take all of your medicine as directed even if you think you are better. Do not skip doses or stop your medicine early. Talk to your pediatrician regarding the use of this medicine in children. While this drug may be prescribed for selected conditions, precautions do apply. Overdosage: If you think you have taken too much of this medicine contact a poison control center or emergency room at once. NOTE: This medicine is only for you. Do not share this medicine with others. What if I  miss a dose? If you miss a dose, take it as soon as you can. If it is almost time for your next dose, take only that dose. Do not take double or extra doses. What may interact with this medicine?  antacids  barbiturates  birth control pills  bismuth subsalicylate  carbamazepine  methoxyflurane  other antibiotics  phenytoin  vitamins that contain iron  warfarin This list may not describe all possible interactions. Give your health care provider a list of all the medicines, herbs, non-prescription drugs, or dietary supplements you use. Also tell them if you smoke, drink alcohol, or use illegal drugs. Some items may interact with your medicine. What should I watch for while using this medicine? Tell your doctor or health care professional if your symptoms do not improve. Do not treat diarrhea with over the counter products. Contact your doctor if you have diarrhea that lasts more than 2 days or if it is severe and watery. Do not take this medicine just before going to bed. It may not dissolve properly when you lay down and can cause pain in your throat. Drink plenty of fluids while taking this medicine to also help reduce irritation in your throat. This medicine can make you more sensitive to the sun. Keep out of the sun. If you cannot avoid being in the sun, wear protective clothing and use sunscreen. Do not use sun lamps or tanning beds/booths. Birth control pills may not work properly while you are taking this medicine. Talk to your doctor about using an extra method of birth control. If you are being treated for a sexually transmitted infection, avoid sexual contact  until you have finished your treatment. Your sexual partner may also need treatment. Avoid antacids, aluminum, calcium, magnesium, and iron products for 4 hours before and 2 hours after taking a dose of this medicine. If you are using this medicine to prevent malaria, you should still protect yourself from contact with  mosquitos. Stay in screened-in areas, use mosquito nets, keep your body covered, and use an insect repellent. What side effects may I notice from receiving this medicine? Side effects that you should report to your doctor or health care professional as soon as possible:  allergic reactions like skin rash, itching or hives, swelling of the face, lips, or tongue  difficulty breathing  fever  itching in the rectal or genital area  pain on swallowing  rash, fever, and swollen lymph nodes  redness, blistering, peeling or loosening of the skin, including inside the mouth  severe stomach pain or cramps  unusual bleeding or bruising  unusually weak or tired  yellowing of the eyes or skin Side effects that usually do not require medical attention (report to your doctor or health care professional if they continue or are bothersome):  diarrhea  loss of appetite  nausea, vomiting This list may not describe all possible side effects. Call your doctor for medical advice about side effects. You may report side effects to FDA at 1-800-FDA-1088. Where should I keep my medicine? Keep out of the reach of children. Store at room temperature, below 30 degrees C (86 degrees F). Protect from light. Keep container tightly closed. Throw away any unused medicine after the expiration date. Taking this medicine after the expiration date can make you seriously ill. NOTE: This sheet is a summary. It may not cover all possible information. If you have questions about this medicine, talk to your doctor, pharmacist, or health care provider.  2020 Elsevier/Gold Standard (2019-01-21 13:44:53) Mupirocin skin cream or ointment What is this medicine? MUPIROCIN (myoo PEER oh sin) is an antibiotic. It is used on the skin to treat skin infections. This medicine may be used for other purposes; ask your health care provider or pharmacist if you have questions. COMMON BRAND NAME(S): Bactroban, Centany, Centany  AT What should I tell my health care provider before I take this medicine? They need to know if you have any of these conditions:  an unusual or allergic reaction to mupirocin, polyethylene glycol (PEG), or other topical antibiotic medicine  pregnant or trying to get pregnant  breast-feeding How should I use this medicine? This medicine is for external use only. Follow the directions on the prescription label. Wash your hands before and after use. Before applying, wash the affected area with mild soap and water and pat dry. Apply a small amount to the affected area and rub gently. You can cover the area with a gauze dressing. Do not get this medicine in your eyes. If you do, rinse out with plenty of cool tap water. Do not use your medicine more often than directed. Finish the full course of medicine prescribed by your doctor or health care professional even if you think your condition is better. Do not use over large areas of burnt skin. Talk to your pediatrician regarding the use of this medicine in children. Special care may be needed. Overdosage: If you think you have taken too much of this medicine contact a poison control center or emergency room at once. NOTE: This medicine is only for you. Do not share this medicine with others. What if I miss a  dose? If you miss a dose, take it as soon as you can. If it is almost time for your next dose, take only that dose. Do not take double or extra doses. What may interact with this medicine? Interactions are not expected. Do not use any other skin products on the affected area without telling your doctor or health care professional. This list may not describe all possible interactions. Give your health care provider a list of all the medicines, herbs, non-prescription drugs, or dietary supplements you use. Also tell them if you smoke, drink alcohol, or use illegal drugs. Some items may interact with your medicine. What should I watch for while using  this medicine? Tell your doctor or health care professional if your skin condition does not begin to improve within 3 to 5 days. What side effects may I notice from receiving this medicine? Side effects that you should report to your doctor or health care professional as soon as possible:  skin rash, redness, continued swelling, burning, itching, stinging, or pain Side effects that usually do not require medical attention (report to your doctor or health care professional if they continue or are bothersome):  dry skin, itching This list may not describe all possible side effects. Call your doctor for medical advice about side effects. You may report side effects to FDA at 1-800-FDA-1088. Where should I keep my medicine? Keep out of the reach of children. Store at room temperature between 20 and 25 degrees C (68 and 77 degrees F). Throw away any unused medicine after the expiration date. NOTE: This sheet is a summary. It may not cover all possible information. If you have questions about this medicine, talk to your doctor, pharmacist, or health care provider.  2020 Elsevier/Gold Standard (2008-05-09 14:38:18) Blisters, Adult  A blister is a raised bubble of skin filled with liquid. Blisters often develop in an area of the skin that repeatedly rubs or presses against another surface (friction blister). Friction blisters can occur on any part of the body, but they usually develop on the hands or feet. Long-term pressure on the same area of the skin can also lead to areas of hardened skin (calluses). What are the causes? A blister can be caused by:  An injury.  A burn.  An allergic reaction.  An infection.  Exposure to irritating chemicals.  Friction, especially in an area with a lot of heat and moisture. Friction blisters often result from:  Sports.  Repetitive activities.  Using tools and doing other activities without wearing gloves.  Shoes that are too tight or too  loose. What are the signs or symptoms? A blister is often round and looks like a bump. It may:  Itch.  Be painful to the touch. Before a blister forms, the skin may:  Become red.  Feel warm.  Itch.  Be painful to the touch. How is this diagnosed? A blister is diagnosed with a physical exam. How is this treated? Treatment usually involves protecting the area where the blister has formed until the skin has healed. Other treatments may include:  A bandage (dressing) to cover the blister.  Extra padding around and over the blister, so that it does not rub on anything.  Antibiotic ointment. Most blisters break open, dry up, and go away on their own within 1-2 weeks. Blisters that are very painful may be drained before they break open on their own. If the blister is large or painful, it can be drained by: 1. Sterilizing a small  needle with rubbing alcohol. 2. Washing your hands with soap and water. 3. Inserting the needle in the edge of the blister to make a small hole. Some fluid will drain out of the hole. Let the top or roof of the blister stay in place. This helps the skin heal. 4. Washing the blister with mild soap and water. 5. Covering the blister with antibiotic ointment, if prescribed by your health care provider, and a dressing. Some blisters may need to be drained by a health care provider. Follow these instructions at home:  Protect the area where the blister has formed as told by your health care provider.  Keep your blister clean and dry. This helps to prevent infection.  Do not pop your blister. This can cause infection.  If you were prescribed an antibiotic, use it as told by your health care provider. Do not stop using the antibiotic even if your condition improves.  Wear different shoes until the blister heals.  Avoid the activity that caused the blister until your blister heals.  Check your blister every day for signs of infection. Check for: ? More  redness, swelling, or pain. ? More fluid or blood. ? Warmth. ? Pus or a bad smell. ? The blister getting better and then getting worse. How is this prevented? Taking these steps can help to prevent blisters that are caused by friction. Make sure you:  Wear comfortable shoes that fit well.  Always wear socks with shoes.  Wear extra socks or use tape, bandages, or pads over blister-prone areas as needed. You may also apply petroleum jelly under bandages in blister-prone areas.  Wear protective gear, such as gloves, when participating in sports or activities that can cause blisters.  Wear loose-fitting, moisture-wicking clothes when participating in sports or activities.  Use powders as needed to keep your feet dry. Contact a health care provider if:  You have more redness, swelling, or pain around your blister.  You have more fluid or blood coming from your blister.  Your blister feels warm to the touch.  You have pus or a bad smell coming from your blister.  You have a fever or chills.  Your blister gets better and then it gets worse. This information is not intended to replace advice given to you by your health care provider. Make sure you discuss any questions you have with your health care provider. Document Released: 11/28/2004 Document Revised: 10/03/2017 Document Reviewed: 05/03/2016 Elsevier Patient Education  [place]  Athlete's foot (tinea pedis) is a fungal infection of the skin on your feet. It often occurs on the skin that is between or underneath your toes. It can also occur on the soles of your feet. Symptoms include itchy or white and flaky areas on the skin. The infection can spread from person to person (is contagious). It can also spread when a person's bare feet come in contact with the fungus on shower floors or on items such as shoes. Follow these instructions at home: Medicines  Apply or take over-the-counter and prescription  medicines only as told by your doctor.  Apply your antifungal medicine as told by your doctor. Do not stop using the medicine even if your feet start to get better. Foot care  Do not scratch your feet.  Keep your feet dry: ? Wear cotton or wool socks. Change your socks every day or if they become wet. ? Wear shoes that allow air to move around, such as sandals or canvas  tennis shoes.  Wash and dry your feet: ? Every day or as told by your doctor. ? After exercising. ? Including the area between your toes. General instructions  Do not share any of these items that touch your feet: ? Towels. ? Shoes. ? Nail clippers. ? Other personal items.  Protect your feet by wearing sandals in wet areas, such as locker rooms and shared showers.  Keep all follow-up visits as told by your doctor. This is important.  If you have diabetes, keep your blood sugar under control. Contact a doctor if:  You have a fever.  You have swelling, pain, warmth, or redness in your foot.  Your feet are not getting better with treatment.  Your symptoms get worse.  You have new symptoms. Summary  Athlete's foot is a fungal infection of the skin on your feet.  Symptoms include itchy or white and flaky areas on the skin.  Apply your antifungal medicine as told by your doctor.  Keep your feet clean and dry. This information is not intended to replace advice given to you by your health care provider. Make sure you discuss any questions you have with your health care provider. Document Released: 04/08/2008 Document Revised: 10/16/2017 Document Reviewed: 08/11/2017 Elsevier Patient Education  2020 [place] for any signs of Cellulitis, Adult  Cellulitis is a skin infection. The infected area is often warm, red, swollen, and sore. It occurs most often in the arms and lower legs. It is very important to get treated for this condition. What are the causes? This condition is caused by bacteria.  The bacteria enter through a break in the skin, such as a cut, burn, insect bite, open sore, or crack. What increases the risk? This condition is more likely to occur in people who:  Have a weak body defense system (immune system).  Have open cuts, burns, bites, or scrapes on the skin.  Are older than 83 years of age.  Have a blood sugar problem (diabetes).  Have a long-lasting (chronic) liver disease (cirrhosis) or kidney disease.  Are very overweight (obese).  Have a skin problem, such as: ? Itchy rash (eczema). ? Slow movement of blood in the veins (venous stasis). ? Fluid buildup below the skin (edema).  Have been treated with high-energy rays (radiation).  Use IV drugs. What are the signs or symptoms? Symptoms of this condition include:  Skin that is: ? Red. ? Streaking. ? Spotting. ? Swollen. ? Sore or painful when you touch it. ? Warm.  A fever.  Chills.  Blisters. How is this diagnosed? This condition is diagnosed based on:  Medical history.  Physical exam.  Blood tests.  Imaging tests. How is this treated? Treatment for this condition may include:  Medicines to treat infections or allergies.  Home care, such as: ? Rest. ? Placing cold or warm cloths (compresses) on the skin.  Hospital care, if the condition is very bad. Follow these instructions at home: Medicines  Take over-the-counter and prescription medicines only as told by your doctor.  If you were prescribed an antibiotic medicine, take it as told by your doctor. Do not stop taking it even if you start to feel better. General instructions   Drink enough fluid to keep your pee (urine) pale yellow.  Do not touch or rub the infected area.  Raise (elevate) the infected area above the level of your heart while you are sitting or lying down.  Place cold or warm cloths on the  area as told by your doctor.  Keep all follow-up visits as told by your doctor. This is important. Contact  a doctor if:  You have a fever.  You do not start to get better after 1-2 days of treatment.  Your bone or joint under the infected area starts to hurt after the skin has healed.  Your infection comes back. This can happen in the same area or another area.  You have a swollen bump in the area.  You have new symptoms.  You feel ill and have muscle aches and pains. Get help right away if:  Your symptoms get worse.  You feel very sleepy.  You throw up (vomit) or have watery poop (diarrhea) for a long time.  You see red streaks coming from the area.  Your red area gets larger.  Your red area turns dark in color. These symptoms may represent a serious problem that is an emergency. Do not wait to see if the symptoms will go away. Get medical help right away. Call your local emergency services (911 in the [place] Do not drive yourself to the hospital. Summary  Cellulitis is a skin infection. The area is often warm, red, swollen, and sore.  This condition is treated with medicines, rest, and cold and warm cloths.  Take all medicines only as told by your doctor.  Tell your doctor if symptoms do not start to get better after 1-2 days of treatment. This information is not intended to replace advice given to you by your health care provider. Make sure you discuss any questions you have with your health care provider. Document Released: 04/08/2008 Document Revised: 03/12/2018 Document Reviewed: 03/12/2018 Elsevier Patient Education  2020 [employer].

## 2019-10-08 NOTE — Progress Notes (Signed)
Patient: Joyce [name] Female    DOB: 1928/02/08   85 y.o.   MRN: [id number] Visit Date: 10/08/2019  Today's Provider: Marcille Buffy, FNP   Chief Complaint  Patient presents with  . Toe Pain   Subjective:     Toe Pain  There was no injury mechanism (Patients daughter reports that she first noticed a blister that was noticed about 2-3 weeks ago). The pain is present in the right foot (2nd digit). The patient is experiencing no pain. Pertinent negatives include no inability to bear weight, loss of motion, loss of sensation, muscle weakness, numbness or tingling. She has tried nothing for the symptoms.   She recently got new shoes, that her daughter reports rubbed a blister on the above toe. She got a blister and now that blister opened up on 2nd toe, 1 week ago. Daughter Joyce [name] present.  She denies any drainage or discharge from the area.  Daughter reports that patient is wearing her shoes and black socks all the time, she even sleeps in her shoes and socks.  Daughter has been trying to get patient to take them off but she does not want to.  Palliative care is no longer coming as patient is doing well She does have a nurse call and follow.  Patient has mild pain reported in her toe at area of concern.  Patient  denies any fever, body aches,chills, rash, chest pain, shortness of breath, nausea, vomiting, or diarrhea.     No Known Allergies   Current Outpatient Medications:  .  acetaminophen (TYLENOL) 325 MG tablet, Take 650 mg by mouth 2 (two) times daily. Taking two tablets every morning., Disp: , Rfl:  .  acidophilus (RISAQUAD) CAPS capsule, Take 1 capsule by mouth daily., Disp: , Rfl:  .  ADVAIR DISKUS 250-50 MCG/DOSE AEPB, INHALE 1 PUFF BY MOUTH TWICE DAILY. RINSE MOUTH WITH WATER AFTER USE TO REDUCE AFTERTASTE AND INCIDENCE OF CANDIDIASIS. DO NOT SWALLOW, Disp: 180 each, Rfl: 3 .  albuterol (PROVENTIL HFA;VENTOLIN HFA) 108 (90 Base) MCG/ACT inhaler, Inhale 1-2  puffs into the lungs every 4 (four) hours as needed for wheezing or shortness of breath., Disp: 1 Inhaler, Rfl: 0 .  albuterol (PROVENTIL) (2.5 MG/3ML) 0.083% nebulizer solution, Take 3 mLs (2.5 mg total) by nebulization every 6 (six) hours as needed for wheezing or shortness of breath., Disp: 75 mL, Rfl: 12 .  alendronate (FOSAMAX) 70 MG tablet, TAKE 1 TABLET BY MOUTH ONCE A WEEK WITH A FULL GLASS OF WATER ON AN EMPTY STOMACH, Disp: 4 tablet, Rfl: 12 .  atorvastatin (LIPITOR) 10 MG tablet, TAKE 1 TABLET BY MOUTH DAILY, Disp: 90 tablet, Rfl: 3 .  calcium carbonate (TUMS - DOSED IN MG ELEMENTAL CALCIUM) 500 MG chewable tablet, Chew 2 tablets by mouth daily. , Disp: , Rfl:  .  cholecalciferol (VITAMIN D) 1000 units tablet, Take 2,000 Units by mouth daily., Disp: , Rfl:  .  DILT-XR 120 MG 24 hr capsule, TAKE 2 CAPSULES BY MOUTH EVERY MORNING AND 1 CAPSULE EVERY EVENING, Disp: 270 capsule, Rfl: 2 .  diltiazem (CARDIZEM CD) 120 MG 24 hr capsule, TK 2 CS PO QAM AND 1 C QPM, Disp: , Rfl:  .  feeding supplement, ENSURE ENLIVE, (ENSURE ENLIVE) LIQD, Take 237 mLs by mouth 2 (two) times daily between meals., Disp: 237 mL, Rfl: 12 .  Fluticasone-Salmeterol (ADVAIR DISKUS) 250-50 MCG/DOSE AEPB, Inhale 1 puff into the lungs 2 (two) times daily., Disp: 60  each, Rfl: 12 .  levofloxacin (LEVAQUIN) 500 MG tablet, Take 1 tablet (500 mg total) by mouth daily., Disp: 3 tablet, Rfl: 0 .  levothyroxine (SYNTHROID) 100 MCG tablet, Take 1 tablet (100 mcg total) by mouth daily., Disp: 90 tablet, Rfl: 1 .  LORazepam (ATIVAN) 0.5 MG tablet, Take 0.5 tablets (0.25 mg total) by mouth 3 (three) times daily as needed for anxiety., Disp: 90 tablet, Rfl: 3 .  mirtazapine (REMERON) 15 MG tablet, Take 1 tablet (15 mg total) by mouth at bedtime., Disp: 30 tablet, Rfl: 12 .  Multiple Vitamin (MULTIVITAMIN WITH MINERALS) TABS tablet, Take 1 tablet by mouth daily., Disp: , Rfl:   Review of Systems  Constitutional: Negative.   HENT:  Negative.   Respiratory: Positive for cough (occasional now improved fromn last visit  - chronic/ COPD - was treated with Levaquin for WBC elevation last month ). Negative for chest tightness. Shortness of breath: chronic- not worsened improved from last visit per daughter    Genitourinary: Negative for difficulty urinating.  Musculoskeletal: Positive for arthralgias.  Skin: Positive for color change (erythema toe/ erythema to lower extremities resolved since last visit. ) and wound. Negative for pallor and rash.  Neurological: Negative for tingling and numbness.  Psychiatric/Behavioral: Negative for behavioral problems and confusion.    Social History   Tobacco Use  . Smoking status: Never Smoker  . Smokeless tobacco: Never Used  Substance Use Topics  . Alcohol use: Not Currently      Objective:    Today's Vitals   10/08/19 0934  BP: 124/70  [name]: 60  Resp: 15  Temp: 98.1 F (36.7 C)  TempSrc: Oral  Weight: 124 lb (56.2 kg)   Body mass index is 24.22 kg/m.   Physical Exam Constitutional:      General: She is not in acute distress.    Appearance: Normal appearance. She is not ill-appearing, toxic-appearing or diaphoretic.  HENT:     Head: Normocephalic and atraumatic.     Nose: Nose normal.     Mouth/Throat:     Mouth: Mucous membranes are moist.  Eyes:     Extraocular Movements: Extraocular movements intact.     Conjunctiva/sclera: Conjunctivae normal.     Pupils: Pupils are equal, round, and reactive to light.  Neck:     Musculoskeletal: Normal range of motion and neck supple.  Cardiovascular:     Rate and Rhythm: Normal rate and regular rhythm.     Pulses: Normal pulses.     Heart sounds: Normal heart sounds.  Pulmonary:     Effort: Pulmonary effort is normal. No respiratory distress.     Breath sounds: No stridor. Rhonchi (mild scattered ) present. No wheezing or rales.  Chest:     Chest wall: No tenderness.  Abdominal:     General: There is no  distension.     Palpations: Abdomen is soft.     Tenderness: There is no abdominal tenderness.  Skin:    General: Skin is warm.     Capillary Refill: Capillary refill takes less than 2 seconds.     Findings: Erythema, signs of injury (friction ) and wound present.     Comments: Subepidermal bulla of 2nd toe right foot appears to be reabsorbing fluid/ flattening. Skin remains intact. Mild erythema surrounding bulla. No streaking or discharge. Pain with touch.  With sensation test- she has normal sensation right foot/ toe.  DP/PT [name] 1 plus   Dry flakes  skin between toes, moist in  areas.   Neurological:     Mental Status: She is alert and oriented to person, place, and time.  Psychiatric:        Mood and Affect: Mood normal.        Behavior: Behavior normal.        Thought Content: Thought content normal.        Judgment: Judgment normal.      No results found for any visits on 10/08/19.     Assessment & Plan    1. Blister of toe of left foot with infection, initial encounter Meds ordered this encounter  Medications  . doxycycline (VIBRA-TABS) 100 MG tablet    Sig: Take 1 tablet (100 mg total) by mouth 2 (two) times daily.    Dispense:  20 tablet    Refill:  0  . mupirocin ointment (BACTROBAN) 2 %    Sig: Apply 1 application topically 2 (two) times daily. Apply very small thin amount after cleaning with soap and water.    Dispense:  22 g    Refill:  0  . clotrimazole (CLOTRIMAZOLE ATHLETES FOOT) 1 % cream    Sig: Apply 1 application topically 2 (two) times daily. Between toes, not on any open skin/ for fungus    Dispense:  60 g    Refill:  0   Athletes Foot. Wear light colored socks, white not black. Take shoes off when you can. Do not sleep in shoes.  Discussed directions and side effects for above medications.   - CBC with Differential/Platelet [id number]  2. Friction blister  Reviewed changing shoes if too tight.   3. Rhonchi  seems to be improving given history.   Will return if any symptoms worsen or return.     Advised patient call the office or your primary care doctor for an appointment if no improvement within 72 hours or if any symptoms change or worsen at any time  Advised ER or urgent Care if after hours or on weekend. Call 911 for emergency symptoms at any time.Patinet verbalized understanding of all instructions given/reviewed and treatment plan and has no further questions or concerns at this time.    Return appointment given for one week,daughter will call if needs to be seen earlier with parameters given.   Return in about 1 week (around 10/15/2019), or if symptoms worsen or fail to improve, for at any time for any worsening symptoms, Go to Emergency room/ urgent care if worse.  The entirety of the information documented in the History of Present Illness, Review of Systems and Physical Exam were personally obtained by me. Portions of this information were initially documented by the  Certified Medical Assistant whose name is documented in [place] and reviewed by me for thoroughness and accuracy.  I have personally performed the exam and reviewed the chart and it is accurate to the best of my knowledge.  Haematologist has been used and any errors in dictation or transcription are unintentional.  Kelby Aline. Kelliher, [facility]

## 2019-10-09 LAB — CBC WITH DIFFERENTIAL/PLATELET

## 2019-10-11 ENCOUNTER — Telehealth: Payer: Self-pay

## 2019-10-11 NOTE — Telephone Encounter (Signed)
Spoke with patient's daughter about the CBC not being able to be tested from her last visit and needed to be recollected/ The patients daughter said she could have it done at her follow up appointment on 10/15/19. An order for the lab has been placed.

## 2019-10-11 NOTE — Progress Notes (Deleted)
Patient: Joyce [name] Female    DOB: December 31, 1927   85 y.o.   MRN: [id number] Visit Date: 10/15/2019  Today's Provider: Marcille Buffy, FNP   No chief complaint on file.  Subjective:    From 10/08/2019:  Blister of toe of left foot with infection, initial encounter     Meds ordered this encounter  Medications  . doxycycline (VIBRA-TABS) 100 MG tablet    Sig: Take 1 tablet (100 mg total) by mouth 2 (two) times daily.    Dispense:  20 tablet    Refill:  0  . mupirocin ointment (BACTROBAN) 2 %    Sig: Apply 1 application topically 2 (two) times daily. Apply very small thin amount after cleaning with soap and water.    Dispense:  22 g    Refill:  0  . clotrimazole (CLOTRIMAZOLE ATHLETES FOOT) 1 % cream    Sig: Apply 1 application topically 2 (two) times daily. Between toes, not on any open skin/ for fungus    Dispense:  60 g    Refill:  0   Athletes Foot. Wear light colored socks, white not black. Take shoes off when you can. Do not sleep in shoes.  Discussed directions and side effects for above medications.   - CBC with Differential/Platelet [id number]  Friction blister  Reviewed changing shoes if too tight.   Rhonchi  seems to be improving given history.  Will return if any symptoms worsen or return.     Advised patient call the office or your primary care doctor for an appointment if no improvement within 72 hours or if any symptoms change or worsen at any time  Advised ER or urgent Care if after hours or on weekend. Call 911 for emergency symptoms at any time.Patinet verbalized understanding of all instructions given/reviewed and treatment plan and has no further questions or concerns at this time.    Return appointment given for one week,daughter will call if needs to be seen earlier with parameters given.   Return in about 1 week (around 10/15/2019), or if symptoms worsen or fail to improve, for at any time for any worsening  symptoms, Go to Emergency room/ urgent care if worse.  HPI  No Known Allergies   Current Outpatient Medications:  .  acetaminophen (TYLENOL) 325 MG tablet, Take 650 mg by mouth 2 (two) times daily. Taking two tablets every morning., Disp: , Rfl:  .  acidophilus (RISAQUAD) CAPS capsule, Take 1 capsule by mouth daily., Disp: , Rfl:  .  ADVAIR DISKUS 250-50 MCG/DOSE AEPB, INHALE 1 PUFF BY MOUTH TWICE DAILY. RINSE MOUTH WITH WATER AFTER USE TO REDUCE AFTERTASTE AND INCIDENCE OF CANDIDIASIS. DO NOT SWALLOW, Disp: 180 each, Rfl: 3 .  albuterol (PROVENTIL HFA;VENTOLIN HFA) 108 (90 Base) MCG/ACT inhaler, Inhale 1-2 puffs into the lungs every 4 (four) hours as needed for wheezing or shortness of breath., Disp: 1 Inhaler, Rfl: 0 .  albuterol (PROVENTIL) (2.5 MG/3ML) 0.083% nebulizer solution, Take 3 mLs (2.5 mg total) by nebulization every 6 (six) hours as needed for wheezing or shortness of breath., Disp: 75 mL, Rfl: 12 .  alendronate (FOSAMAX) 70 MG tablet, TAKE 1 TABLET BY MOUTH ONCE A WEEK WITH A FULL GLASS OF WATER ON AN EMPTY STOMACH, Disp: 4 tablet, Rfl: 12 .  atorvastatin (LIPITOR) 10 MG tablet, TAKE 1 TABLET BY MOUTH DAILY, Disp: 90 tablet, Rfl: 3 .  calcium carbonate (TUMS - DOSED IN MG ELEMENTAL CALCIUM) 500 MG  chewable tablet, Chew 2 tablets by mouth daily. , Disp: , Rfl:  .  cholecalciferol (VITAMIN D) 1000 units tablet, Take 2,000 Units by mouth daily., Disp: , Rfl:  .  clotrimazole (CLOTRIMAZOLE ATHLETES FOOT) 1 % cream, Apply 1 application topically 2 (two) times daily. Between toes, not on any open skin/ for fungus, Disp: 60 g, Rfl: 0 .  DILT-XR 120 MG 24 hr capsule, TAKE 2 CAPSULES BY MOUTH EVERY MORNING AND 1 CAPSULE EVERY EVENING, Disp: 270 capsule, Rfl: 2 .  diltiazem (CARDIZEM CD) 120 MG 24 hr capsule, TK 2 CS PO QAM AND 1 C QPM, Disp: , Rfl:  .  doxycycline (VIBRA-TABS) 100 MG tablet, Take 1 tablet (100 mg total) by mouth 2 (two) times daily., Disp: 20 tablet, Rfl: 0 .  feeding  supplement, ENSURE ENLIVE, (ENSURE ENLIVE) LIQD, Take 237 mLs by mouth 2 (two) times daily between meals., Disp: 237 mL, Rfl: 12 .  Fluticasone-Salmeterol (ADVAIR DISKUS) 250-50 MCG/DOSE AEPB, Inhale 1 puff into the lungs 2 (two) times daily., Disp: 60 each, Rfl: 12 .  levofloxacin (LEVAQUIN) 500 MG tablet, Take 1 tablet (500 mg total) by mouth daily., Disp: 3 tablet, Rfl: 0 .  levothyroxine (SYNTHROID) 100 MCG tablet, Take 1 tablet (100 mcg total) by mouth daily., Disp: 90 tablet, Rfl: 1 .  LORazepam (ATIVAN) 0.5 MG tablet, Take 0.5 tablets (0.25 mg total) by mouth 3 (three) times daily as needed for anxiety., Disp: 90 tablet, Rfl: 3 .  mirtazapine (REMERON) 15 MG tablet, Take 1 tablet (15 mg total) by mouth at bedtime., Disp: 30 tablet, Rfl: 12 .  Multiple Vitamin (MULTIVITAMIN WITH MINERALS) TABS tablet, Take 1 tablet by mouth daily., Disp: , Rfl:  .  mupirocin ointment (BACTROBAN) 2 %, Apply 1 application topically 2 (two) times daily. Apply very small thin amount after cleaning with soap and water., Disp: 22 g, Rfl: 0  Review of Systems  Social History   Tobacco Use  . Smoking status: Never Smoker  . Smokeless tobacco: Never Used  Substance Use Topics  . Alcohol use: Not Currently      Objective:   There were no vitals taken for this visit. There were no vitals filed for this visit.There is no height or weight on file to calculate BMI.   Physical Exam   No results found for any visits on 10/15/19.     Assessment & Jeannette, [facility]

## 2019-10-11 NOTE — Progress Notes (Signed)
Lab was unable to obtain specimen. Patient's daughter is aware and is aware how to get redrawn and symptoms to return to the clinic for.

## 2019-10-14 ENCOUNTER — Other Ambulatory Visit: Payer: Self-pay | Admitting: Family Medicine

## 2019-10-14 DIAGNOSIS — F411 Generalized anxiety disorder: Secondary | ICD-10-CM

## 2019-10-14 NOTE — Telephone Encounter (Signed)
[facility] faxed refill request for the following medications:  LORazepam (ATIVAN) 0.5 MG tablet   Please advise.  Thanks, [employer]

## 2019-10-15 ENCOUNTER — Ambulatory Visit (INDEPENDENT_AMBULATORY_CARE_PROVIDER_SITE_OTHER): Payer: Medicare Other | Admitting: Adult Health

## 2019-10-15 ENCOUNTER — Other Ambulatory Visit: Payer: Self-pay

## 2019-10-15 ENCOUNTER — Encounter: Payer: Self-pay | Admitting: Adult Health

## 2019-10-15 VITALS — BP 130/80 | HR 96 | Temp 98.0°F | Resp 16 | Wt 121.6 lb

## 2019-10-15 DIAGNOSIS — L989 Disorder of the skin and subcutaneous tissue, unspecified: Secondary | ICD-10-CM | POA: Diagnosis not present

## 2019-10-15 DIAGNOSIS — S90425D Blister (nonthermal), left lesser toe(s), subsequent encounter: Secondary | ICD-10-CM | POA: Diagnosis not present

## 2019-10-15 DIAGNOSIS — L89153 Pressure ulcer of sacral region, stage 3: Secondary | ICD-10-CM

## 2019-10-15 DIAGNOSIS — Z760 Encounter for issue of repeat prescription: Secondary | ICD-10-CM | POA: Diagnosis not present

## 2019-10-15 DIAGNOSIS — F411 Generalized anxiety disorder: Secondary | ICD-10-CM | POA: Diagnosis not present

## 2019-10-15 DIAGNOSIS — L089 Local infection of the skin and subcutaneous tissue, unspecified: Secondary | ICD-10-CM | POA: Diagnosis not present

## 2019-10-15 DIAGNOSIS — S90425A Blister (nonthermal), left lesser toe(s), initial encounter: Secondary | ICD-10-CM | POA: Diagnosis not present

## 2019-10-15 MED ORDER — LORAZEPAM 0.5 MG PO TABS: 0.2500 mg | ORAL_TABLET | Freq: Two times a day (BID) | ORAL | 0 refills | Status: DC | PRN

## 2019-10-15 NOTE — Addendum Note (Signed)
Addended by: Doreen Beam on: 10/15/2019 11:40 AM   Modules accepted: Orders

## 2019-10-15 NOTE — Addendum Note (Signed)
Addended by: Doreen Beam on: 10/15/2019 01:21 PM   Modules accepted: Orders

## 2019-10-15 NOTE — Progress Notes (Addendum)
Patient: Joyce [name] Female    DOB: 08/27/1928   85 y.o.   MRN: [id number] Visit Date: 10/15/2019  Today's Provider: Marcille Buffy, FNP   Chief Complaint  Patient presents with  . Follow-up    Toe Blister   Subjective:     HPI  Follow up for blister of toe left foot  The patient was last seen for this 1 weeks ago. Changes made at last visit include starting patient on Doxycycline 100mg , Bactroban 2% . CBC was ordered but not drawn as lab could not obtain specimen.   She reports good compliance with treatment. She feels that condition is Unchanged. She is having side effects. Patient reports G.I upset with Doxycycline, she has around two days left after today and is able to tolerate. No diarrhea or vomiting.   Daughter also reports at today's visit that patient she has a wound as well on her tailbone.  She has seen wound center as referred by Dr. Rosanna Randy before and have not returned  wound still present since February daughter unsure if change as it is in a hard area to visualize. Provider was not made aware of this second wound  at last visit . Daughter denies any odor or drainage from wound. Patient wears depends.  Last documented visit wound center 07/09/2019- home therapy was initiated at daughters request.   08/20/2019 palliative nurse visit : Nurse provided education to [name] on wound care and made recommendation to apply hydrocolloid dressing or place with a foam dressing over area. Daughter instructed to obtain skin prep and paint a picture frame around wound to hold dressing in place  Patient  denies any fever, body aches,chills, rash, chest pain, shortness of breath, nausea, vomiting, or diarrhea.   Daughter reports her mom is taking 0.25 mg of ativan most days twice daily. Patient feels this is working well, does not cause sedation and she feels more relaxed. Denies ant falls.  Daughter requesting a refill.    Daughter and patient reports  that she has been more anxious with all going on on the news and watching it.  Taking Remeron 15 mg tablet at bedtime.      ------------------------------------------------------------------------------------  No Known Allergies   Current Outpatient Medications:  .  acetaminophen (TYLENOL) 325 MG tablet, Take 650 mg by mouth 2 (two) times daily. Taking two tablets every morning., Disp: , Rfl:  .  acidophilus (RISAQUAD) CAPS capsule, Take 1 capsule by mouth daily., Disp: , Rfl:  .  ADVAIR DISKUS 250-50 MCG/DOSE AEPB, INHALE 1 PUFF BY MOUTH TWICE DAILY. RINSE MOUTH WITH WATER AFTER USE TO REDUCE AFTERTASTE AND INCIDENCE OF CANDIDIASIS. DO NOT SWALLOW, Disp: 180 each, Rfl: 3 .  albuterol (PROVENTIL HFA;VENTOLIN HFA) 108 (90 Base) MCG/ACT inhaler, Inhale 1-2 puffs into the lungs every 4 (four) hours as needed for wheezing or shortness of breath., Disp: 1 Inhaler, Rfl: 0 .  albuterol (PROVENTIL) (2.5 MG/3ML) 0.083% nebulizer solution, Take 3 mLs (2.5 mg total) by nebulization every 6 (six) hours as needed for wheezing or shortness of breath., Disp: 75 mL, Rfl: 12 .  alendronate (FOSAMAX) 70 MG tablet, TAKE 1 TABLET BY MOUTH ONCE A WEEK WITH A FULL GLASS OF WATER ON AN EMPTY STOMACH, Disp: 4 tablet, Rfl: 12 .  atorvastatin (LIPITOR) 10 MG tablet, TAKE 1 TABLET BY MOUTH DAILY, Disp: 90 tablet, Rfl: 3 .  calcium carbonate (TUMS - DOSED IN MG ELEMENTAL CALCIUM) 500 MG chewable tablet, Chew  2 tablets by mouth daily. , Disp: , Rfl:  .  cholecalciferol (VITAMIN D) 1000 units tablet, Take 2,000 Units by mouth daily., Disp: , Rfl:  .  clotrimazole (CLOTRIMAZOLE ATHLETES FOOT) 1 % cream, Apply 1 application topically 2 (two) times daily. Between toes, not on any open skin/ for fungus, Disp: 60 g, Rfl: 0 .  DILT-XR 120 MG 24 hr capsule, TAKE 2 CAPSULES BY MOUTH EVERY MORNING AND 1 CAPSULE EVERY EVENING, Disp: 270 capsule, Rfl: 2 .  diltiazem (CARDIZEM CD) 120 MG 24 hr capsule, TK 2 CS PO QAM AND 1 C QPM,  Disp: , Rfl:  .  doxycycline (VIBRA-TABS) 100 MG tablet, Take 1 tablet (100 mg total) by mouth 2 (two) times daily., Disp: 20 tablet, Rfl: 0 .  feeding supplement, ENSURE ENLIVE, (ENSURE ENLIVE) LIQD, Take 237 mLs by mouth 2 (two) times daily between meals., Disp: 237 mL, Rfl: 12 .  Fluticasone-Salmeterol (ADVAIR DISKUS) 250-50 MCG/DOSE AEPB, Inhale 1 puff into the lungs 2 (two) times daily., Disp: 60 each, Rfl: 12 .  levofloxacin (LEVAQUIN) 500 MG tablet, Take 1 tablet (500 mg total) by mouth daily., Disp: 3 tablet, Rfl: 0 .  levothyroxine (SYNTHROID) 100 MCG tablet, Take 1 tablet (100 mcg total) by mouth daily., Disp: 90 tablet, Rfl: 1 .  LORazepam (ATIVAN) 0.5 MG tablet, Take 0.5 tablets (0.25 mg total) by mouth 3 (three) times daily as needed for anxiety., Disp: 90 tablet, Rfl: 3 .  mirtazapine (REMERON) 15 MG tablet, Take 1 tablet (15 mg total) by mouth at bedtime., Disp: 30 tablet, Rfl: 12 .  Multiple Vitamin (MULTIVITAMIN WITH MINERALS) TABS tablet, Take 1 tablet by mouth daily., Disp: , Rfl:  .  mupirocin ointment (BACTROBAN) 2 %, Apply 1 application topically 2 (two) times daily. Apply very small thin amount after cleaning with soap and water., Disp: 22 g, Rfl: 0  Review of Systems  Social History   Tobacco Use  . Smoking status: Never Smoker  . Smokeless tobacco: Never Used  Substance Use Topics  . Alcohol use: Not Currently      Objective:  Blood pressure 130/80, pulse 96, temperature 98 F (36.7 C), temperature source Oral, resp. rate 16, weight 121 lb 9.6 oz (55.2 kg).  Physical Exam Vitals reviewed.  Constitutional:      General: She is not in acute distress.    Appearance: Normal appearance. She is not ill-appearing, toxic-appearing or diaphoretic.  HENT:     Head: Normocephalic and atraumatic.  Cardiovascular:     Rate and Rhythm: Normal rate and regular rhythm.  Pulmonary:     Effort: Pulmonary effort is normal. No respiratory distress.     Breath sounds: No  stridor. Rhonchi present. No wheezing or rales.     Comments: Mild scattered rhonchi on the left upper lobe much improved from last visit.  Chest:     Chest wall: No tenderness.  Abdominal:     General: Abdomen is flat.     Palpations: Abdomen is soft.  Musculoskeletal:     Cervical back: Normal range of motion and neck supple.  Lymphadenopathy:     Cervical: No cervical adenopathy.  Skin:    Capillary Refill: Capillary refill takes less than 2 seconds.     Findings: Erythema and wound present.     Comments: See media for stage II sacral wound between gluteal fold no drainage or odor.   See media for toe wound - right second digit  Improved from last visit. Still  mild erythema, removed tight gauze dressing prior to photograph.  Patient has sensation to foot and toe.  She has DP 1+. Skin temperature is normal. No drainage to toe.   Has also been using clotrimazole not on toe, however between toe's with improvement in dry flakey skin that appeared fungal at previous visit.   Neurological:     General: No focal deficit present.     Mental Status: She is alert and oriented to person, place, and time.  Psychiatric:        Mood and Affect: Mood normal.        Behavior: Behavior normal.        Judgment: Judgment normal.      No results found for any visits on 10/15/19.     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 Referral back to wound center placed as home care is not coming and patient needs help with dressings.  Keep clean and dry. Instructions given.  Daughter is in agreement to go back to the wound center.   Toe wound from blister/  friction is actually improved from previous visit. Infection clearing. Blister is no longer raised and skin appears to be drying. She has continued to wear shoes most of the time, was sleeping in shoes. She has switched to looser shoes. She has had toe wrapped in gauze bandaged prior to this visit. Discontinue Bactroban ointment now. Will have wound clinic follow this as it heals as well.  Care instructions given.    She will  Finish doxycycline as prescribed, no worsening signs of infection, Will check CBC today.     Wound center should be calling. Call us if you have not heard.   Advised patient call the office or your primary care doctor for an appointment if no improvement within 72 hours or if any symptoms change or worsen at any time  Advised ER or urgent Care if after hours or on weekend. Call 911 for emergency symptoms at any time.Patinet verbalized understanding of all instructions given/reviewed and treatment plan and has no further questions or concerns at this time.    Return in about 2 weeks (around 10/29/2019), or if symptoms worsen or fail to improve, for at any time for any worsening symptoms, Go to Emergency room/ urgent care if worse. Lab was unable to obtain specimen. Patient's daughter is aware and is aware how to get redrawn and symptoms to return to the clinic for.Lab was unable to obtain specimen. Patient's daughter is aware and is aware how to get redrawn and symptoms to return to the clinic for. The entirety of the information documented in the History of Present Illness, Review of Systems and Physical Exam were personally obtained by me. Portions of this information were initially documented by the  Certified Medical Assistant whose name is documented in [place] and reviewed by me for thoroughness and accuracy.  I have personally performed the exam and reviewed the chart and it is accurate to the  best of my knowledge.  Haematologist has been used and any errors in dictation or  transcription are unintentional.  Kelby Aline. [place], [facility]

## 2019-10-15 NOTE — Patient Instructions (Addendum)
Call back if any symptoms worsening or changing . Emergency room if signs of infection.   Pressure Injury  A pressure injury is damage to the skin and underlying tissue that results from pressure being applied to an area of the body. It often affects people who must spend a long time in a bed or chair because of a medical condition. Pressure injuries usually occur:  Over bony parts of the body, such as the tailbone, shoulders, elbows, hips, heels, spine, ankles, and back of the head. Under medical devices that make contact with the body, such as respiratory equipment, stockings, tubes, a Wound Care, Adult Taking care of your wound properly can help to prevent pain, infection, and scarring. It can also help your wound to heal more quickly. How to care for your wound Wound care      Follow instructions from your health care provider about how to take care of your wound. Make sure you: ? Wash your hands with soap and water before you change the bandage (dressing). If soap and water are not available, use hand sanitizer. ? Change your dressing as told by your health care provider. ? Leave stitches (sutures), skin glue, or adhesive strips in place. These skin closures may need to stay in place for 2 weeks or longer. If adhesive strip edges start to loosen and curl up, you may trim the loose edges. Do not remove adhesive strips completely unless your health care provider tells you to do that.  Check your wound area every day for signs of infection. Check for: ? Redness, swelling, or pain. ? Fluid or blood. ? Warmth. ? Pus or a bad smell.  Ask your health care provider if you should clean the wound with mild soap and water. Doing this may include: ? Using a clean towel to pat the wound dry after cleaning it. Do not rub or scrub the wound. ? Applying a cream or ointment. Do this only as told by your health care provider. ? Covering the incision with a clean dressing.  Ask your health care  provider when you can leave the wound uncovered.  Keep the dressing dry until your health care provider says it can be removed. Do not take baths, swim, use a hot tub, or do anything that would put the wound underwater until your health care provider approves. Ask your health care provider if you can take showers. You may only be allowed to take sponge baths. Medicines   If you were prescribed an antibiotic medicine, cream, or ointment, take or use the antibiotic as told by your health care provider. Do not stop taking or using the antibiotic even if your condition improves.  Take over-the-counter and prescription medicines only as told by your health care provider. If you were prescribed pain medicine, take it 30 or more minutes before you do any wound care or as told by your health care provider. General instructions  Return to your normal activities as told by your health care provider. Ask your health care provider what activities are safe.  Do not scratch or pick at the wound.  Do not use any products that contain nicotine or tobacco, such as cigarettes and e-cigarettes. These may delay wound healing. If you need help quitting, ask your health care provider.  Keep all follow-up visits as told by your health care provider. This is important.  Eat a diet that includes protein, vitamin A, vitamin C, and other nutrient-rich foods to help the wound heal. ?  Foods rich in protein include meat, dairy, beans, nuts, and other sources. ? Foods rich in vitamin A include carrots and dark green, leafy vegetables. ? Foods rich in vitamin C include citrus, tomatoes, and other fruits and vegetables. ? Nutrient-rich foods have protein, carbohydrates, fat, vitamins, or minerals. Eat a variety of healthy foods including vegetables, fruits, and whole grains. Contact a health care provider if:  You received a tetanus shot and you have swelling, severe pain, redness, or bleeding at the injection site.   Your pain is not controlled with medicine.  You have redness, swelling, or pain around the wound.  You have fluid or blood coming from the wound.  Your wound feels warm to the touch.  You have pus or a bad smell coming from the wound.  You have a fever or chills.  You are nauseous or you vomit.  You are dizzy. Get help right away if:  You have a red streak going away from your wound.  The edges of the wound open up and separate.  Your wound is bleeding, and the bleeding does not stop with gentle pressure.  You have a rash.  You faint.  You have trouble breathing. Summary  Always wash your hands with soap and water before changing your bandage (dressing).  To help with healing, eat foods that are rich in protein, vitamin A, vitamin C, and other nutrients.  Check your wound every day for signs of infection. Contact your health care provider if you suspect that your wound is infected. This information is not intended to replace advice given to you by your health care provider. Make sure you discuss any questions you have with your health care provider. Document Released: 07/30/2008 Document Revised: 02/08/2019 Document Reviewed: 05/07/2016 Elsevier Patient Education  [place].  d splints. Pressure injuries start as reddened areas on the skin and can lead to pain and an open wound. What are the causes? This condition is caused by frequent or constant pressure to an area of the body. Decreased blood flow to the skin can eventually cause the skin tissue to die and break down, causing a wound. What increases the risk? You are more likely to develop this condition if you:  Are in the hospital or an extended care facility.  Are bedridden or in a wheelchair.  Have an injury or disease that keeps you from: ? Moving normally. ? Feeling pain or pressure.  Have a condition that: ? Makes you sleepy or less alert. ? Causes poor blood flow.  Need to wear a medical  device.  Have poor control of your bladder or bowel functions (incontinence).  Have poor nutrition (malnutrition). If you are at risk for pressure injuries, your health care provider may recommend certain types of mattresses, mattress covers, pillows, cushions, or boots to help prevent them. These may include products filled with air, foam, gel, or sand. What are the signs or symptoms? Symptoms of this condition depend on the severity of the injury. Symptoms may include:  Red or dark areas of the skin.  Pain, warmth, or a change of skin texture.  Blisters.  An open wound. How is this diagnosed? This condition is diagnosed with a medical history and physical exam. You may also have tests, such as:  Blood tests.  Imaging tests.  Blood flow tests. Your pressure injury will be staged based on its severity. Staging is based on:  The depth of the tissue injury, including whether there is exposure of muscle,  bone, or tendon.  The cause of the pressure injury. How is this treated? This condition may be treated by:  Relieving or redistributing pressure on your skin. This includes: ? Frequently changing your position. ? Avoiding positions that caused the wound or that can make the wound worse. ? Using specific bed mattresses, chair cushions, or protective boots. ? Moving medical devices from an area of pressure, or placing padding between the skin and the device. ? Using foams, creams, or powders to prevent rubbing (friction) on the skin.  Keeping your skin clean and dry. This may include using a skin cleanser or skin barrier as told by your health care provider.  Cleaning your injury and removing any dead tissue from the wound (debridement).  Placing a bandage (dressing) over your injury.  Using medicines for pain or to prevent or treat infection. Surgery may be needed if other treatments are not working or if your injury is very deep. Follow these instructions at home: Wound  care  Follow instructions from your health care provider about how to take care of your wound. Make sure you: ? Wash your hands with soap and water before and after you change your bandage (dressing). If soap and water are not available, use hand sanitizer. ? Change your dressing as told by your health care provider.  Check your wound every day for signs of infection. Have a caregiver do this for you if you are not able. Check for: ? Redness, swelling, or increased pain. ? More fluid or blood. ? Warmth. ? Pus or a bad smell. Skin care  Keep your skin clean and dry. Gently pat your skin dry.  Do not rub or massage your skin.  You or a caregiver should check your skin every day for any changes in color or any new blisters or sores (ulcers). Medicines  Take over-the-counter and prescription medicines only as told by your health care provider.  If you were prescribed an antibiotic medicine, take or apply it as told by your health care provider. Do not stop using the antibiotic even if your condition improves. Reducing and redistributing pressure  Do not lie or sit in one position for a long time. Move or change position every 1-2 hours, or as told by your health care provider.  Use pillows or cushions to reduce pressure. Ask your health care provider to recommend cushions or pads for you. General instructions   Eat a healthy diet that includes lots of protein.  Drink enough fluid to keep your urine pale yellow.  Be as active as you can every day. Ask your health care provider to suggest safe exercises or activities.  Do not abuse drugs or alcohol.  Do not use any products that contain nicotine or tobacco, such as cigarettes, e-cigarettes, and chewing tobacco. If you need help quitting, ask your health care provider.  Keep all follow-up visits as told by your health care provider. This is important. Contact a health care provider if:  You have: ? A fever or chills. ? Pain  that is not helped by medicine. ? Any changes in skin color. ? New blisters or sores. ? Pus or a bad smell coming from your wound. ? Redness, swelling, or pain around your wound. ? More fluid or blood coming from your wound.  Your wound does not improve after 1-2 weeks of treatment. Summary  A pressure injury is damage to the skin and underlying tissue that results from pressure being applied to an area of  the body.  Do not lie or sit in one position for a long time. Your health care provider may advise you to move or change position every 1-2 hours.  Follow instructions from your health care provider about how to take care of your wound.  Keep all follow-up visits as told by your health care provider. This is important. This information is not intended to replace advice given to you by your health care provider. Make sure you discuss any questions you have with your health care provider. Document Released: 10/21/2005 Document Revised: 05/20/2018 Document Reviewed: 05/20/2018 Elsevier Patient Education  [place].

## 2019-10-15 NOTE — Telephone Encounter (Signed)
I refilled Ativan at 0.25 mg twice daily only as needed.60 tablets. No refills.  Reviewed possible side effects at office visit with daughter.  Will call back when further refills needed.

## 2019-10-16 LAB — CBC WITH DIFFERENTIAL/PLATELET
Basophils Absolute: 0.1 10*3/uL (ref 0.0–0.2)
Basos: 1 %
EOS (ABSOLUTE): 0.1 10*3/uL (ref 0.0–0.4)
Eos: 1 %
Hematocrit: 40.6 % (ref 34.0–46.6)
Hemoglobin: 12.7 g/dL (ref 11.1–15.9)
Immature Grans (Abs): 0 10*3/uL (ref 0.0–0.1)
Immature Granulocytes: 0 %
Lymphocytes Absolute: 2.3 10*3/uL (ref 0.7–3.1)
Lymphs: 18 %
MCH: 26.8 pg (ref 26.6–33.0)
MCHC: 31.3 g/dL — ABNORMAL LOW (ref 31.5–35.7)
MCV: 86 fL (ref 79–97)
Monocytes Absolute: 0.9 10*3/uL (ref 0.1–0.9)
Monocytes: 7 %
Neutrophils Absolute: 9 10*3/uL — ABNORMAL HIGH (ref 1.4–7.0)
Neutrophils: 73 %
Platelets: 285 10*3/uL (ref 150–450)
RBC: 4.74 x10E6/uL (ref 3.77–5.28)
RDW: 15.5 % — ABNORMAL HIGH (ref 11.7–15.4)
WBC: 12.3 10*3/uL — ABNORMAL HIGH (ref 3.4–10.8)

## 2019-10-19 NOTE — Progress Notes (Signed)
WBC trending down from 1 month ago. Lung sounds improved last visit. She has no other systemic signs of infection at last office visit.  Patient currently on Doxycycline 100 mg BID and is going back to the wound clinic for chronic sacral wound and right toe wound that needs wound care monitoring. Patient and daughter were in agreement to return to the wound clinic as they had a hard time caring for wound at home.

## 2019-10-20 ENCOUNTER — Encounter: Payer: Self-pay | Admitting: Family Medicine

## 2019-10-20 ENCOUNTER — Telehealth: Payer: Self-pay | Admitting: Adult Health

## 2019-10-20 DIAGNOSIS — R0989 Other specified symptoms and signs involving the circulatory and respiratory systems: Secondary | ICD-10-CM

## 2019-10-20 DIAGNOSIS — L89153 Pressure ulcer of sacral region, stage 3: Secondary | ICD-10-CM

## 2019-10-20 DIAGNOSIS — R634 Abnormal weight loss: Secondary | ICD-10-CM

## 2019-10-20 NOTE — Telephone Encounter (Signed)
Hello Joyce [name],  I hope you are both doing well. Your mom had a decrease in white blood cells on 09/15/2019 was 17.2 and on 10/15/2019 was 12.3 improving and she still had a couple days of doxycycline left to take. Her lungs also sounded better at last visit. She has the sacral wound which I am concerned about and the toe wound as well and am glad to see that you have an appointment at the wound center tomorrow. They may decide to treat again with antibiotics if warranted at that time. I definitely recommend keeping regular follow up's with the wound center. If any new symptoms or you have concerns please feel free to reach out to [place] here.  I did consult with Dr. Rosanna Randy and he is fine with increasing her Mirtazapine to 30 mg nightly once by mouth with the hopes of decreasing the Ativan to no more than once daily. Let us know if you see behavioral changes or unusual drowsiness. Let me know if you have questions.  Thanks, Joyce Peace MSN, AGNP-C, FNP-C     Spoke with Joyce [name] to [employer] above. She will come for repeat lab in 2 weeks, appointment at wound center 10/21/2019.  She has plenty of Remeron and will increase to dose above and call if questions.  She will call when needs refill. Decrease Ativan to once daily or none.  No questions or concerns  Orders Placed This Encounter  Procedures  . TSH  . CBC with Differential/Platelet [phone]

## 2019-10-21 ENCOUNTER — Encounter: Payer: Medicare Other | Attending: Physician Assistant | Admitting: Physician Assistant

## 2019-10-21 ENCOUNTER — Other Ambulatory Visit: Payer: Self-pay

## 2019-10-21 DIAGNOSIS — L89890 Pressure ulcer of other site, unstageable: Secondary | ICD-10-CM | POA: Insufficient documentation

## 2019-10-21 DIAGNOSIS — L89152 Pressure ulcer of sacral region, stage 2: Secondary | ICD-10-CM | POA: Insufficient documentation

## 2019-10-21 DIAGNOSIS — I872 Venous insufficiency (chronic) (peripheral): Secondary | ICD-10-CM | POA: Diagnosis not present

## 2019-10-21 DIAGNOSIS — L89153 Pressure ulcer of sacral region, stage 3: Secondary | ICD-10-CM | POA: Insufficient documentation

## 2019-10-21 DIAGNOSIS — I890 Lymphedema, not elsewhere classified: Secondary | ICD-10-CM | POA: Diagnosis not present

## 2019-10-21 DIAGNOSIS — J449 Chronic obstructive pulmonary disease, unspecified: Secondary | ICD-10-CM | POA: Diagnosis not present

## 2019-10-21 DIAGNOSIS — L97519 Non-pressure chronic ulcer of other part of right foot with unspecified severity: Secondary | ICD-10-CM | POA: Insufficient documentation

## 2019-10-21 DIAGNOSIS — M1990 Unspecified osteoarthritis, unspecified site: Secondary | ICD-10-CM | POA: Insufficient documentation

## 2019-10-21 DIAGNOSIS — I10 Essential (primary) hypertension: Secondary | ICD-10-CM | POA: Insufficient documentation

## 2019-10-21 DIAGNOSIS — L89159 Pressure ulcer of sacral region, unspecified stage: Secondary | ICD-10-CM | POA: Diagnosis present

## 2019-10-25 NOTE — Progress Notes (Signed)
LUNA, WANKO ([id number]) Visit Report for 10/21/2019 Chief Complaint Document Details Patient Name: Joyce [name], [name] Date of Service: 10/21/2019 9:45 AM Medical Record Number: [id number] Patient Account Number: [id number] Date of Birth/Sex: 1928/04/24 (85 y.o. F) Treating RN: Army Melia Primary Care Provider: [name] Other Clinician: Referring Provider: [name] Treating Provider/Extender: Melburn Hake, Jaeleen Inzunza Weeks in Treatment: 0 Information Obtained from: Patient Chief Complaint Sacral pressure ulcer and toe ulcer Electronic Signature(s) Signed: 10/21/2019 10:29:13 AM By: [name] PA-C Entered By: [name] on 10/21/2019 10:29:13 Berti, Shakeitha L. ([id number]) -------------------------------------------------------------------------------- HPI Details Patient Name: Joyce [name], [name] Date of Service: 10/21/2019 9:45 AM Medical Record Number: [id number] Patient Account Number: [id number] Date of Birth/Sex: 03-04-1928 (85 y.o. F) Treating RN: Army Melia Primary Care Provider: [name] Other Clinician: Referring Provider: [name] Treating Provider/Extender: [name] Weeks in Treatment: 0 History of Present Illness HPI Description: 05/14/19 on evaluation today patient appears to be doing rather well in regard to her lower extremities compared to what it sounds like she was doing not that far back. Fortunately there's no signs of active infection at this time although it does sound like she had significant cellulitis earlier in the year. She does also have a issue with a sacral ulcer upon inspection today which has been present since around February 2020 this is about as long as she's been dealing with her legs as well. She is under palliative care but not hospice. The patient does have a history of scoliosis and kyphosis which makes it difficult for her to lay flat she actually sleep sitting up in a recliner but night and  reclined at this cause her trouble breathing. She also has COPD, hypertension, and bilateral lower should be lymphedema and venous stasis. Currently there's no signs of active cellulitis of the bilateral lower extremities which is good news. The patient has not had any vascular intervention nor evaluation that we have on file. She does spend the majority per day apparently sitting in her recliner upright. This I think is an issue both for her legs as well as for the sacral region unfortunately. She could not tolerate the ABI's today due to the fact that it calls too much pain when we were performing the blood pressure reading on her lower extremities who were not even able to get a proper reading. No fevers, chills, nausea, or vomiting noted at this time. 05/25/19 on evaluation today patient appears to be doing about the same in regard to her sacral wound maybe slightly better compared to previous. Fortunately there's no signs of active infection at this time which is good news in regard to her lower extremities. She has previously had cellulitis therefore were trying to keep an eye on this as far as that's concerned. Nonetheless since I last saw her which was actually roughly 2 weeks ago since due to work her daughter was unable to bring her last week she is not had any care through home health and has not gotten compression stockings for her bilateral lower extremities. Subsequently the issue with home health seems to be multifactorial when they contacted the patient and her daughter in order to get things scheduled they were told according to the daughter that she didn't know why they needed to come out three times a week and wasn't aware that that was what the order was gonna be. The daughter tells me that that somewhat poor timing for her as she works and  doesn't have a lot of time to hang around at the house waiting for home health. With that being said I explained that the only thing we can  really do is either have home health come out and perform the dressing changes or else potentially have a family member do this. There apparently is no family member including herself who is willing to do this she tells me "that she doesn't feel qualified and comfortable". Nonetheless either way the patient has not had a dressing on the sacral area since the last time she was here she uses a pad hasn't really noted much drainage onto the pad although it does appear that the wound itself trapped a lot of the fluid within skin folds. In the end I do think that since she doesn't really have anyone to perform the dressing changes at home we are going to need to utilize the services of home health. They are in agreement with that plan. 06/01/19 on evaluation today patient presents for follow-up concerning her wound in the midline sacral area. This seems to be doing well there is some evidence of new skin growth there still an open sore however it is gonna require dressing. Home health is coming out to see her I think we do need to see about getting her a better schedule where potentially she comes entire clinic on Monday and then on Wednesday and Friday his home health change in her dressing as it stands right now I feel like this is happening to a regular for her. Fortunately there's no signs of any infection at this time her legs are doing well although she does have the compression stockings finally she doesn't actually have those on as of today. They want [place] to show them how to put them on. 06/08/19 on evaluation today patient came in today without any dressing on her sacral region. Apparently this fell off around Friday and unfortunately it was supposed to be reapplied Friday but the patient had an appointment scheduled for 11 o'clock and her home health nurse was supposed to arrive at 33. She was running late and therefore they called and canceled her coming out to perform the dressing change and  subsequently even though they had dressing material at home her daughter refuses to apply the dressing to the wound. For that reason she went all weekend without any dressing until today. She also has gotten the compression stockings but has just use the Tubigrip on occasion although she doesn't even have that on today. Apparently her daughter stating that the compression stockings are too hard to get on and therefore they're not able to do that. Nonetheless as I stated patient came in with nothing on her lower extremities as far as compression is concerned. They do want to see about extending her visits to every two weeks. AASTHA, GUTMAN ([id number]) 07/02/19 on evaluation today patient presents after apparently having been quarantined for 14 days due to having been exposed to someone with Covid-19 Virus during the time that she is been quarantined she has not actually had her dressing changed it all apparently nobody would come around her including her daughter. Nonetheless her daughter hasn't been changing the dressing even prior they did have a home health nurse coming out but it Gillermina Phy may have been about a week's worth of time that the patient actually had home health change in the dressing and even then there was some issues with timing and them actually coming out an appropriate time  that the patient and her daughter felt was within reason. Nonetheless they feel like they had a much better experience with [facility] in the past. 07/09/2019 on evaluation today patient appears to be doing unfortunately somewhat the same in regard to her wound. She really has not had a dressing on since we put one on last week. Then her daughter really is not able to perform the dressing changes the patient tells me that she has a hard time with the location of the wound had been able to perform the dressing change at this site. Nonetheless they also had issues with home health they wanted if we would  initiate home health therapy to have a different company come out. 10/21/2019 on evaluation today patient presents for follow-up concerning the same issue on her midline sacral region. This is the same area that I was taken care of back last in September and that is the last time I saw her. We have never actually healed this out and at that point I did actually recommend that we were using dressings unfortunately the dressings just never worked out for her. She could not keep these in place. For that reason I did actually suggest that we could try some creams but again then she never came back for follow-up but I am not really sure exactly what they have been doing in the interim. She has not really noted any significant bleeding and drainage has been she tells me fairly minimal both of which are good signs. With that being said unfortunately she still has an open wound which I explained is still a risk factor for infection. She also has an area on her second toe of the right foot right at the bend of the toe where this is somewhat hammered and subsequently rubs on her shoe. I think this too is a pressure/friction injury and subsequently it seems to be doing much better she has been on antibiotics both oral as well as topical mupirocin and taken by mouth she is unsure of what the antibiotic was. However she is also been using some antifungal cream around the toes in general. Overall the toe does seem to be doing much better with no obvious signs of infection at this point Electronic Signature(s) Signed: 10/21/2019 1:46:18 PM By: [name] PA-C Entered By: [name] on 10/21/2019 13:46:17 Joyce [name], [name] ([id number]) -------------------------------------------------------------------------------- Physical Exam Details Patient Name: Joyce [name], [name] Date of Service: 10/21/2019 9:45 AM Medical Record Number: [id number] Patient Account Number: [id number] Date of Birth/Sex: 03-02-1928 (85  y.o. F) Treating RN: Army Melia Primary Care Provider: [name] Other Clinician: Referring Provider: [name] Treating Provider/Extender: Melburn Hake, Tamra Koos Weeks in Treatment: 0 Constitutional patient is hypertensive.. pulse regular and within target range for patient.Marland Kitchen respirations regular, non-labored and within target range for patient.Marland Kitchen temperature within target range for patient.. Well-nourished and well-hydrated in no acute distress. Eyes conjunctiva clear no eyelid edema noted. pupils equal round and reactive to light and accommodation. Ears, Nose, Mouth, and Throat no gross abnormality of ear auricles or external auditory canals. normal hearing noted during conversation. mucus membranes moist. Respiratory normal breathing without difficulty. clear to auscultation bilaterally. Cardiovascular regular rate and rhythm with normal S1, S2. 2+ dorsalis pedis/posterior tibialis pulses. no clubbing, cyanosis, significant edema, <3 sec cap refill. Gastrointestinal (GI) soft, non-tender, non-distended, +BS. no ventral hernia noted. Musculoskeletal normal gait and posture. no significant deformity or arthritic changes, no loss or range of motion, no clubbing.  Psychiatric this patient is able to make decisions and demonstrates good insight into disease process. Alert and Oriented x 3. pleasant and cooperative. Notes His wound bed currently showed signs of good granulation at this time in regard to the sacral wound. I do feel like she is managing this quite nicely all things considered as far as the fact that it is not any worse as far as size is concerned nor infected. With that being said I think this is to be a very difficult thing to get to heal with the redundant tissue around and then subsequently she also has issues with obviously with keeping a dressing on again due to the loose tissue surrounding. This is just a very difficult wound and again in the past she  has never been able to really keep a dressing on it. Electronic Signature(s) Signed: 10/21/2019 1:47:00 PM By: [name] PA-C Entered By: [name] on 10/21/2019 13:46:59 Mcfetridge, Irven [place] ([id number]) -------------------------------------------------------------------------------- Physician Orders Details Patient Name: Balster, Alandria L. Date of Service: 10/21/2019 9:45 AM Medical Record Number: [id number] Patient Account Number: [id number] Date of Birth/Sex: February 10, 1928 (85 y.o. F) Treating RN: Army Melia Primary Care Provider: [name], [name] Other Clinician: Referring Provider: [name] Treating Provider/Extender: Melburn Hake, Glorya Bartley Weeks in Treatment: 0 Verbal / Phone Orders: No Diagnosis Coding ICD-10 Coding Code Description L89.152 Pressure ulcer of sacral region, stage 2 L89.890 Pressure ulcer of other site, unstageable I89.0 Lymphedema, not elsewhere classified I87.2 Venous insufficiency (chronic) (peripheral) I10 Essential (primary) hypertension J44.9 Chronic obstructive pulmonary disease, unspecified Wound Cleansing Wound #2 Midline Sacrum o Clean wound with Normal Saline. - in office o Dial antibacterial soap, wash wounds, rinse and pat dry prior to dressing wounds Wound #3 Right Toe Second o Clean wound with Normal Saline. - in office o Dial antibacterial soap, wash wounds, rinse and pat dry prior to dressing wounds Skin Barriers/Peri-Wound Care Wound #2 Midline Sacrum o Barrier cream Wound #3 Right Toe Second o Other: - betadine to toe Secondary Dressing Wound #3 Right Toe Second o Gauze and Kerlix/Conform - gauze and conform wrap Dressing Change Frequency Wound #2 Midline Sacrum o Change dressing every day. - right second toe o Other: - Midline sacrum, 3 times daily Wound #3 Right Toe Second o Change dressing every day. - right second toe o Other: - Midline sacrum, 3 times daily Follow-up Appointments Wound #2  Midline Sacrum o Return Appointment in 3 weeks. Crean, Megumi L. ([id number]) Wound #3 Right Toe Second o Return Appointment in 3 weeks. Electronic Signature(s) Signed: 10/21/2019 11:07:02 AM By: Army Melia Signed: 10/22/2019 10:58:36 AM By: [name] PA-C Entered By: Army Melia on 10/21/2019 10:48:37 Morelos, Verlie L. ([id number]) -------------------------------------------------------------------------------- Problem List Details Patient Name: Pennypacker, Dyamon L. Date of Service: 10/21/2019 9:45 AM Medical Record Number: [id number] Patient Account Number: [id number] Date of Birth/Sex: Feb 18, 1928 (85 y.o. F) Treating RN: Army Melia Primary Care Provider: [name] Other Clinician: Referring Provider: [name], [name] Treating Provider/Extender: Sharalyn Ink in Treatment: 0 Active Problems ICD-10 Evaluated Encounter Code Description Active Date Today Diagnosis L89.153 Pressure ulcer of sacral region, stage 3 10/21/2019 No Yes L89.890 Pressure ulcer of other site, unstageable 10/21/2019 No Yes I89.0 Lymphedema, not elsewhere classified 10/21/2019 No Yes I87.2 Venous insufficiency (chronic) (peripheral) 10/21/2019 No Yes I10 Essential (primary) hypertension 10/21/2019 No Yes J44.9 Chronic obstructive pulmonary disease, unspecified 10/21/2019 No Yes Inactive Problems Resolved Problems Electronic Signature(s) Signed: 10/21/2019 1:44:25 PM By: [name] PA-C  Previous Signature: 10/21/2019 10:27:39 AM Version By: [name] PA-C Entered By: [name] on 10/21/2019 13:44:25 Tun, Carnisha L. ([id number]) -------------------------------------------------------------------------------- Progress Note Details Patient Name: Graybeal, Mikaella L. Date of Service: 10/21/2019 9:45 AM Medical Record Number: [id number] Patient Account Number: [id number] Date of Birth/Sex: 1928/02/16 (85 y.o. F) Treating RN: Army Melia Primary Care Provider: [name], [name] Other Clinician: Referring Provider: [name] Treating Provider/Extender: Sharalyn Ink in Treatment: 0 Subjective Chief Complaint Information obtained from Patient Sacral pressure ulcer and toe ulcer History of Present Illness (HPI) 05/14/19 on evaluation today patient appears to be doing rather well in regard to her lower extremities compared to what it sounds like she was doing not that far back. Fortunately there's no signs of active infection at this time although it does sound like she had significant cellulitis earlier in the year. She does also have a issue with a sacral ulcer upon inspection today which has been present since around February 2020 this is about as long as she's been dealing with her legs as well. She is under palliative care but not hospice. The patient does have a history of scoliosis and kyphosis which makes it difficult for her to lay flat she actually sleep sitting up in a recliner but night and reclined at this cause her trouble breathing. She also has COPD, hypertension, and bilateral lower should be lymphedema and venous stasis. Currently there's no signs of active cellulitis of the bilateral lower extremities which is good news. The patient has not had any vascular intervention nor evaluation that we have on file. She does spend the majority per day apparently sitting in her recliner upright. This I think is an issue both for her legs as well as for the sacral region unfortunately. She could not tolerate the ABI's today due to the fact that it calls too much pain when we were performing the blood pressure reading on her lower extremities who were not even able to get a proper reading. No fevers, chills, nausea, or vomiting noted at this time. 05/25/19 on evaluation today patient appears to be doing about the same in regard to her sacral wound maybe slightly better compared to previous. Fortunately there's no signs of active infection  at this time which is good news in regard to her lower extremities. She has previously had cellulitis therefore were trying to keep an eye on this as far as that's concerned. Nonetheless since I last saw her which was actually roughly 2 weeks ago since due to work her daughter was unable to bring her last week she is not had any care through home health and has not gotten compression stockings for her bilateral lower extremities. Subsequently the issue with home health seems to be multifactorial when they contacted the patient and her daughter in order to get things scheduled they were told according to the daughter that she didn't know why they needed to come out three times a week and wasn't aware that that was what the order was gonna be. The daughter tells me that that somewhat poor timing for her as she works and doesn't have a lot of time to hang around at the house waiting for home health. With that being said I explained that the only thing we can really do is either have home health come out and perform the dressing changes or else potentially have a family member do this. There apparently is no family member including herself who is willing  to do this she tells me "that she doesn't feel qualified and comfortable". Nonetheless either way the patient has not had a dressing on the sacral area since the last time she was here she uses a pad hasn't really noted much drainage onto the pad although it does appear that the wound itself trapped a lot of the fluid within skin folds. In the end I do think that since she doesn't really have anyone to perform the dressing changes at home we are going to need to utilize the services of home health. They are in agreement with that plan. 06/01/19 on evaluation today patient presents for follow-up concerning her wound in the midline sacral area. This seems to be doing well there is some evidence of new skin growth there still an open sore however it is gonna  require dressing. Home health is coming out to see her I think we do need to see about getting her a better schedule where potentially she comes entire clinic on Monday and then on Wednesday and Friday his home health change in her dressing as it stands right now I feel like this is happening to a regular for her. Fortunately there's no signs of any infection at this time her legs are doing well although she does have the compression stockings finally she doesn't actually have those on as of today. They want [place] to show them how to put them on. 06/08/19 on evaluation today patient came in today without any dressing on her sacral region. Apparently this fell off around Friday and unfortunately it was supposed to be reapplied Friday but the patient had an appointment scheduled for 11 o'clock and her home health nurse was supposed to arrive at 38. She was running late and therefore they called and canceled her AMAMDA, HAMMITT. ([id number]) coming out to perform the dressing change and subsequently even though they had dressing material at home her daughter refuses to apply the dressing to the wound. For that reason she went all weekend without any dressing until today. She also has gotten the compression stockings but has just use the Tubigrip on occasion although she doesn't even have that on today. Apparently her daughter stating that the compression stockings are too hard to get on and therefore they're not able to do that. Nonetheless as I stated patient came in with nothing on her lower extremities as far as compression is concerned. They do want to see about extending her visits to every two weeks. 07/02/19 on evaluation today patient presents after apparently having been quarantined for 14 days due to having been exposed to someone with Covid-19 Virus during the time that she is been quarantined she has not actually had her dressing changed it all apparently nobody would come around her including  her daughter. Nonetheless her daughter hasn't been changing the dressing even prior they did have a home health nurse coming out but it Gillermina Phy may have been about a week's worth of time that the patient actually had home health change in the dressing and even then there was some issues with timing and them actually coming out an appropriate time that the patient and her daughter felt was within reason. Nonetheless they feel like they had a much better experience with [facility] in the past. 07/09/2019 on evaluation today patient appears to be doing unfortunately somewhat the same in regard to her wound. She really has not had a dressing on since we put one on last week. Then her daughter  really is not able to perform the dressing changes the patient tells me that she has a hard time with the location of the wound had been able to perform the dressing change at this site. Nonetheless they also had issues with home health they wanted if we would initiate home health therapy to have a different company come out. 10/21/2019 on evaluation today patient presents for follow-up concerning the same issue on her midline sacral region. This is the same area that I was taken care of back last in September and that is the last time I saw her. We have never actually healed this out and at that point I did actually recommend that we were using dressings unfortunately the dressings just never worked out for her. She could not keep these in place. For that reason I did actually suggest that we could try some creams but again then she never came back for follow-up but I am not really sure exactly what they have been doing in the interim. She has not really noted any significant bleeding and drainage has been she tells me fairly minimal both of which are good signs. With that being said unfortunately she still has an open wound which I explained is still a risk factor for infection. She also has an area on her  second toe of the right foot right at the bend of the toe where this is somewhat hammered and subsequently rubs on her shoe. I think this too is a pressure/friction injury and subsequently it seems to be doing much better she has been on antibiotics both oral as well as topical mupirocin and taken by mouth she is unsure of what the antibiotic was. However she is also been using some antifungal cream around the toes in general. Overall the toe does seem to be doing much better with no obvious signs of infection at this point Patient History Information obtained from Patient. Allergies No Known Allergies Family History Cancer - Child, Diabetes - Siblings, Hypertension - Siblings, Stroke - Mother, No family history of Heart Disease, Kidney Disease, Lung Disease, Seizures, Thyroid Problems, Tuberculosis. Social History Never smoker, Marital Status - Widowed, Alcohol Use - Rarely, Caffeine Use - Daily. Medical History Respiratory Patient has history of Chronic Obstructive Pulmonary Disease (COPD) Integumentary (Skin) Patient has history of History of pressure wounds - Backside and toe Musculoskeletal Patient has history of Osteoarthritis - hands, knees, sholders Oncologic Denies history of Received Chemotherapy, Received Radiation Medical And Surgical History Notes Daley, Geanine L. ([id number]) Oncologic Breast Cancer, Thyroid Cancer Review of Systems (ROS) Integumentary (Skin) Complains or has symptoms of Wounds, Bleeding or bruising tendency. Objective Constitutional patient is hypertensive.. pulse regular and within target range for patient.Marland Kitchen respirations regular, non-labored and within target range for patient.Marland Kitchen temperature within target range for patient.. Well-nourished and well-hydrated in no acute distress. Vitals Time Taken: 10:00 AM, Height: 60 in, Source: Stated, Weight: 118 lbs, Source: Measured, BMI: 23, Temperature: 97.6 F, Pulse: 79 bpm, Respiratory Rate: 16 breaths/min,  Blood Pressure: 141/63 mmHg. Eyes conjunctiva clear no eyelid edema noted. pupils equal round and reactive to light and accommodation. Ears, Nose, Mouth, and Throat no gross abnormality of ear auricles or external auditory canals. normal hearing noted during conversation. mucus membranes moist. Respiratory normal breathing without difficulty. clear to auscultation bilaterally. Cardiovascular regular rate and rhythm with normal S1, S2. 2+ dorsalis pedis/posterior tibialis pulses. no clubbing, cyanosis, significant edema, Gastrointestinal (GI) soft, non-tender, non-distended, +BS. no ventral hernia noted. Musculoskeletal normal gait and  posture. no significant deformity or arthritic changes, no loss or range of motion, no clubbing. Psychiatric this patient is able to make decisions and demonstrates good insight into disease process. Alert and Oriented x 3. pleasant and cooperative. General Notes: His wound bed currently showed signs of good granulation at this time in regard to the sacral wound. I do feel like she is managing this quite nicely all things considered as far as the fact that it is not any worse as far as size is concerned nor infected. With that being said I think this is to be a very difficult thing to get to heal with the redundant tissue around and then subsequently she also has issues with obviously with keeping a dressing on again due to the loose tissue surrounding. This is just a very difficult wound and again in the past she has never been able to really keep a dressing on it. Integumentary (Hair, Skin) Wound #2 status is Open. Original cause of wound was Pressure Injury. The wound is located on the Midline Sacrum. The wound measures 2.5cm length x 1.5cm width x 0.3cm depth; 2.945cm^2 area and 0.884cm^3 volume. There is Fat Layer (Subcutaneous Tissue) Exposed exposed. There is no tunneling or undermining noted. There is a medium amount of serous Joyce [name], [name]  ([id number]) drainage noted. The wound margin is flat and intact. There is large (67-100%) pink granulation within the wound bed. There is a small (1-33%) amount of necrotic tissue within the wound bed including Adherent Slough. Wound #3 status is Open. Original cause of wound was Pressure Injury. The wound is located on the Right Toe Second. The wound measures 0.7cm length x 0.7cm width x 0.1cm depth; 0.385cm^2 area and 0.038cm^3 volume. There is a none present amount of drainage noted. There is no granulation within the wound bed. There is a large (67-100%) amount of necrotic tissue within the wound bed including Eschar. Assessment Active Problems ICD-10 Pressure ulcer of sacral region, stage 3 Pressure ulcer of other site, unstageable Lymphedema, not elsewhere classified Venous insufficiency (chronic) (peripheral) Essential (primary) hypertension Chronic obstructive pulmonary disease, unspecified Plan Wound Cleansing: Wound #2 Midline Sacrum: Clean wound with Normal Saline. - in office Dial antibacterial soap, wash wounds, rinse and pat dry prior to dressing wounds Wound #3 Right Toe Second: Clean wound with Normal Saline. - in office Dial antibacterial soap, wash wounds, rinse and pat dry prior to dressing wounds Skin Barriers/Peri-Wound Care: Wound #2 Midline Sacrum: Barrier cream Wound #3 Right Toe Second: Other: - betadine to toe Secondary Dressing: Wound #3 Right Toe Second: Gauze and Kerlix/Conform - gauze and conform wrap Dressing Change Frequency: Wound #2 Midline Sacrum: Change dressing every day. - right second toe Other: - Midline sacrum, 3 times daily Wound #3 Right Toe Second: Change dressing every day. - right second toe Other: - Midline sacrum, 3 times daily Follow-up Appointments: Wound #2 Midline Sacrum: Return Appointment in 3 weeks. Wound #3 Right Toe Second: Return Appointment in 3 weeks. Droege, Laiken L. ([id number]) 1. My suggestion at this time is  good to be that we go ahead and initiate treatment with zinc oxide to the sacral region I think this is probably the best option for the patient. 2. I would also recommend that we go ahead and use Betadine topically to the toe to help dry this up and hopefully get it to come loose healed underneath. We will pad this area on the toe with a dry gauze dressing in order to help in  that regard. 3. I am in a suggest as well that we go ahead and have the patient is much as possible try to avoid any pressure to her sacral region obviously pressure and friction could make this worse and obviously the goal is to try to get this doing better. I really think she should also apply the zinc oxide 3 times a day. We will see patient back for reevaluation in 3 weeks here in the clinic. If anything worsens or changes patient will contact our office for additional recommendations. Electronic Signature(s) Signed: 10/21/2019 1:48:14 PM By: [name] PA-C Entered By: [name] on 10/21/2019 13:48:14 Kindred, Lillyona LMarland Kitchen ([id number]) -------------------------------------------------------------------------------- ROS/PFSH Details Patient Name: Joyce [name], [name] Date of Service: 10/21/2019 9:45 AM Medical Record Number: [id number] Patient Account Number: [id number] Date of Birth/Sex: Dec 21, 1927 (85 y.o. F) Treating RN: Army Melia Primary Care Provider: [name] Other Clinician: Referring Provider: [name] Treating Provider/Extender: Melburn Hake, Nickoles Gregori Weeks in Treatment: 0 Information Obtained From Patient Integumentary (Skin) Complaints and Symptoms: Positive for: Wounds; Bleeding or bruising tendency Medical History: Positive for: History of pressure wounds - Backside and toe Respiratory Medical History: Positive for: Chronic Obstructive Pulmonary Disease (COPD) Musculoskeletal Medical History: Positive for: Osteoarthritis - hands, knees, sholders Oncologic Medical  History: Negative for: Received Chemotherapy; Received Radiation Past Medical History Notes: Breast Cancer, Thyroid Cancer Immunizations Pneumococcal Vaccine: Received Pneumococcal Vaccination: Yes Implantable Devices None Family and Social History Cancer: Yes - Child; Diabetes: Yes - Siblings; Heart Disease: No; Hypertension: Yes - Siblings; Kidney Disease: No; Lung Disease: No; Seizures: No; Stroke: Yes - Mother; Thyroid Problems: No; Tuberculosis: No; Never smoker; Marital Status - Widowed; Alcohol Use: Rarely; Caffeine Use: Daily Electronic Signature(s) Signed: 10/22/2019 3:57:56 PM By: Gretta Cool, BSN, RN, CWS, Kim RN, BSN Signed: 10/22/2019 4:46:07 PM By: [name] PA-C Signed: 10/25/2019 9:22:11 AM By: Army Melia Previous Signature: 10/21/2019 11:07:02 AM Version By: Army Melia Previous Signature: 10/22/2019 10:58:36 AM Version By: [name] PA-C Entered By: Gretta Cool BSN, RN, CWS, Kim on 10/22/2019 15:54:19 Slauson, Emberlee L. ([id number]) [place], [place] ([id number]) -------------------------------------------------------------------------------- SuperBill Details Patient Name: Joyce [name], [name] Date of Service: 10/21/2019 Medical Record Number: [id number] Patient Account Number: [id number] Date of Birth/Sex: [birth year] (85 y.o. F) Treating RN: Army Melia Primary Care Provider: [name], [name] Other Clinician: Referring Provider: [name] Treating Provider/Extender: Melburn Hake, Marylynn Rigdon Weeks in Treatment: 0 Diagnosis Coding ICD-10 Codes Code Description L89.153 Pressure ulcer of sacral region, stage 3 L89.890 Pressure ulcer of other site, unstageable I89.0 Lymphedema, not elsewhere classified I87.2 Venous insufficiency (chronic) (peripheral) I10 Essential (primary) hypertension J44.9 Chronic obstructive pulmonary disease, unspecified Facility Procedures CPT4 Code: [id number] Description: [phone] - WOUND CARE VISIT-LEV 5 EST PT Modifier: Quantity:  1 Physician Procedures CPT4 Code: [id number] Description: [id number] - WC PHYS LEVEL 4 - EST PT ICD-10 Diagnosis Description L89.153 Pressure ulcer of sacral region, stage 3 L89.890 Pressure ulcer of other site, unstageable I89.0 Lymphedema, not elsewhere classified I87.2 Venous insufficiency (chronic)  (peripheral) Modifier: Quantity: 1 Electronic Signature(s) Signed: 10/22/2019 1:32:33 PM By: Sharon Mt Signed: 10/22/2019 4:46:07 PM By: [name] PA-C Previous Signature: 10/21/2019 1:48:28 PM Version By: [name] PA-C Entered By: Sharon Mt on 10/22/2019 13:32:33

## 2019-10-25 NOTE — Progress Notes (Signed)
SANI, DIERSEN ([id number]) Visit Report for 10/21/2019 Allergy List Details Patient Name: Joyce [name], [name] Date of Service: 10/21/2019 9:45 AM Medical Record Number: [id number] Patient Account Number: [id number] Date of Birth/Sex: 10-20-1928 (85 y.o. F) Treating RN: Army Melia Primary Care Holden Draughon: [name] Other Clinician: Referring Thailyn Khalid: [name] Treating Earlena Werst/Extender: Melburn Hake, HOYT Weeks in Treatment: 0 Allergies Active Allergies No Known Allergies Allergy Notes Electronic Signature(s) Signed: 10/22/2019 3:54:12 PM By: Gretta Cool, BSN, RN, CWS, Kim RN, BSN Entered By: Gretta Cool, BSN, RN, CWS, Kim on 10/22/2019 15:54:12 [place], Irven [place] ([id number]) -------------------------------------------------------------------------------- Arrival Information Details Patient Name: Joyce [name], [name] Date of Service: 10/21/2019 9:45 AM Medical Record Number: [id number] Patient Account Number: [id number] Date of Birth/Sex: [birth year] (85 y.o. F) Treating RN: Army Melia Primary Care Tracie Dore: [name], [name] Other Clinician: Referring Barbar Brede: [name] Treating Krissia Schreier/Extender: Sharalyn Ink in Treatment: 0 Visit Information Patient Arrived: Walker Arrival Time: 09:58 Accompanied By: daughter Transfer Assistance: None Patient Identification Verified: Yes Secondary Verification Process Completed: Yes History Since Last Visit Added or deleted any medications: No Any new allergies or adverse reactions: No Had a fall or experienced change in activities of daily living that may affect risk of falls: No Signs or symptoms of abuse/neglect since last visito No Hospitalized since last visit: No Implantable device outside of the clinic excluding cellular tissue based products placed in the center since last visit: No Electronic Signature(s) Signed: 10/22/2019 3:53:28 PM By: Gretta Cool, BSN, RN, CWS, Kim RN, BSN Entered By: Gretta Cool, BSN, RN, CWS, Kim  on 10/22/2019 15:53:28 [place], Irven [place] ([id number]) -------------------------------------------------------------------------------- Clinic Level of Care Assessment Details Patient Name: Joyce [name], [name] Date of Service: 10/21/2019 9:45 AM Medical Record Number: [id number] Patient Account Number: [id number] Date of Birth/Sex: 05-Feb-1928 (85 y.o. F) Treating RN: Army Melia Primary Care Aldine Chakraborty: [name] Other Clinician: Referring Avion Patella: [name] Treating Kashara Blocher/Extender: Melburn Hake, HOYT Weeks in Treatment: 0 Clinic Level of Care Assessment Items TOOL 2 Quantity Score []  - Use when only an EandM is performed on the INITIAL visit 0 ASSESSMENTS - Nursing Assessment / Reassessment X - General Physical Exam (combine w/ comprehensive assessment (listed just below) when 1 20 performed on new pt. evals) X- 1 25 Comprehensive Assessment (HX, ROS, Risk Assessments, Wounds Hx, etc.) ASSESSMENTS - Wound and Skin Assessment / Reassessment []  - Simple Wound Assessment / Reassessment - one wound 0 X- 2 5 Complex Wound Assessment / Reassessment - multiple wounds []  - 0 Dermatologic / Skin Assessment (not related to wound area) ASSESSMENTS - Ostomy and/or Continence Assessment and Care []  - Incontinence Assessment and Management 0 []  - 0 Ostomy Care Assessment and Management (repouching, etc.) PROCESS - Coordination of Care X - Simple Patient / Family Education for ongoing care 1 15 []  - 0 Complex (extensive) Patient / Family Education for ongoing care X- 1 10 Staff obtains Programmer, systems, Records, Test Results / Process Orders []  - 0 Staff telephones HHA, Nursing Homes / Clarify orders / etc []  - 0 Routine Transfer to another Facility (non-emergent condition) []  - 0 Routine Hospital Admission (non-emergent condition) X- 1 15 New Admissions / Biomedical engineer / Ordering NPWT, Apligraf, etc. []  - 0 Emergency Hospital Admission (emergent condition) X- 1  10 Simple Discharge Coordination []  - 0 Complex (extensive) Discharge Coordination PROCESS - Special Needs []  - Pediatric / Minor Patient Management 0 []  - 0 Isolation Patient Management Demir, Andra L. ([id number]) []  - 0 Hearing / Language / Visual  special needs []  - 0 Assessment of Community assistance (transportation, D/C planning, etc.) []  - 0 Additional assistance / Altered mentation []  - 0 Support Surface(s) Assessment (bed, cushion, seat, etc.) INTERVENTIONS - Wound Cleansing / Measurement X - Wound Imaging (photographs - any number of wounds) 1 5 []  - 0 Wound Tracing (instead of photographs) []  - 0 Simple Wound Measurement - one wound X- 2 5 Complex Wound Measurement - multiple wounds []  - 0 Simple Wound Cleansing - one wound X- 2 5 Complex Wound Cleansing - multiple wounds INTERVENTIONS - Wound Dressings []  - Small Wound Dressing one or multiple wounds 0 X- 2 15 Medium Wound Dressing one or multiple wounds []  - 0 Large Wound Dressing one or multiple wounds []  - 0 Application of Medications - injection INTERVENTIONS - Miscellaneous []  - External ear exam 0 []  - 0 Specimen Collection (cultures, biopsies, blood, body fluids, etc.) []  - 0 Specimen(s) / Culture(s) sent or taken to Lab for analysis []  - 0 Patient Transfer (multiple staff / Civil Service fast streamer / Similar devices) []  - 0 Simple Staple / Suture removal (25 or less) []  - 0 Complex Staple / Suture removal (26 or more) []  - 0 Hypo / Hyperglycemic Management (close monitor of Blood Glucose) []  - 0 Ankle / Brachial Index (ABI) - do not check if billed separately Has the patient been seen at the hospital within the last three years: Yes Total Score: 160 Level Of Care: New/Established - Level 5 Electronic Signature(s) Signed: 10/21/2019 11:07:02 AM By: Army Melia Entered By: Army Melia on 10/21/2019 10:49:11 Brinkmeier, Jameson L.  ([id number]) -------------------------------------------------------------------------------- Encounter Discharge Information Details Patient Name: Joyce [name], [name] Date of Service: 10/21/2019 9:45 AM Medical Record Number: [id number] Patient Account Number: [id number] Date of Birth/Sex: 07-08-1928 (85 y.o. F) Treating RN: Army Melia Primary Care Julien Berryman: [name] Other Clinician: Referring Izzabella Besse: [name] Treating Mikala Podoll/Extender: Melburn Hake, HOYT Weeks in Treatment: 0 Encounter Discharge Information Items Discharge Condition: Stable Ambulatory Status: Walker Discharge Destination: Home Transportation: Other Accompanied By: daughter Schedule Follow-up Appointment: Yes Clinical Summary of Care: Electronic Signature(s) Signed: 10/21/2019 11:07:02 AM By: Army Melia Entered By: Army Melia on 10/21/2019 10:50:03 Selover, Nathalya L. ([id number]) -------------------------------------------------------------------------------- Lower Extremity Assessment Details Patient Name: Joyce [name], [name] Date of Service: 10/21/2019 9:45 AM Medical Record Number: [id number] Patient Account Number: [id number] Date of Birth/Sex: 1928-07-09 (85 y.o. F) Treating RN: Army Melia Primary Care Kathern Lobosco: [name], [name] Other Clinician: Referring Gean Laursen: [name], RICHARD Treating Catrice Zuleta/Extender: Melburn Hake, HOYT Weeks in Treatment: 0 Edema Assessment Assessed: [Left: No] [Right: No] [Left: Edema] [Right: :] Calf Left: Right: Point of Measurement: 30 cm From Medial Instep cm 34.3 cm Ankle Left: Right: Point of Measurement: 9 cm From Medial Instep cm 24 cm Vascular Assessment Pulses: Dorsalis Pedis Palpable: [Right:Yes] Posterior Tibial Palpable: [Right:Yes] Blood Pressure: Brachial: [Right:100] Dorsalis Pedis: 144 Ankle: Posterior Tibial: 142 Ankle Brachial Index: [Right:1.44] Electronic Signature(s) Signed: 10/22/2019 3:54:08 PM By: Gretta Cool, BSN, RN, CWS,  Kim RN, BSN Signed: 10/25/2019 9:22:11 AM By: Army Melia Previous Signature: 10/21/2019 11:07:02 AM Version By: Army Melia Entered By: Gretta Cool BSN, RN, CWS, Kim on 10/22/2019 15:54:08 Senger, Raylen L. ([id number]) -------------------------------------------------------------------------------- Multi Wound Chart Details Patient Name: Joyce [name], [name] Date of Service: 10/21/2019 9:45 AM Medical Record Number: [id number] Patient Account Number: [id number] Date of Birth/Sex: Mar 10, 1928 (85 y.o. F) Treating RN: Army Melia Primary Care Lamiyah Schlotter: [name] Other Clinician: Referring Alverna Fawley: [name] Treating Tavish Gettis/Extender: Melburn Hake, HOYT Weeks in  Treatment: 0 Vital Signs Height(in): 60 Pulse(bpm): 79 Weight(lbs): 118 Blood Pressure(mmHg): 141/63 Body Mass Index(BMI): 23 Temperature(F): 97.6 Respiratory Rate 16 (breaths/min): Photos: [N/A:N/A] Wound Location: Sacrum - Midline Right Toe Second N/A Wounding Event: Pressure Injury Pressure Injury N/A Primary Etiology: Pressure Ulcer Pressure Ulcer N/A Comorbid History: Chronic Obstructive Chronic Obstructive N/A Pulmonary Disease (COPD), Pulmonary Disease (COPD), History of pressure wounds, History of pressure wounds, Osteoarthritis Osteoarthritis Date Acquired: 12/19/2018 09/19/2019 N/A Weeks of Treatment: 0 0 N/A Wound Status: Open Open N/A Measurements L x W x D 2.5x1.5x0.3 0.7x0.7x0.1 N/A (cm) Area (cm) : 2.945 0.385 N/A Volume (cm) : 0.884 0.038 N/A % Reduction in Area: 0.00% 0.00% N/A % Reduction in Volume: 0.00% 0.00% N/A Classification: Category/Stage II Unstageable/Unclassified N/A Exudate Amount: Medium None Present N/A Exudate Type: Serous N/A N/A Exudate Color: amber N/A N/A Wound Margin: Flat and Intact N/A N/A Granulation Amount: Medium (34-66%) None Present (0%) N/A Granulation Quality: Pink N/A N/A Necrotic Amount: Medium (34-66%) Large (67-100%) N/A Necrotic Tissue: Adherent  Slough Eschar N/A Exposed Structures: Fat Layer (Subcutaneous N/A N/A Tissue) Exposed: Yes Fascia: No Tendon: No Gheen, Kaegan L. ([id number]) Muscle: No Joint: No Bone: No Epithelialization: Small (1-33%) None N/A Treatment Notes Electronic Signature(s) Signed: 10/21/2019 11:07:02 AM By: Army Melia Entered By: Army Melia on 10/21/2019 10:36:05 Joyce [name], Joyce [name] Jews ([id number]) -------------------------------------------------------------------------------- [place] Details Patient Name: Joyce [name], [name] Date of Service: 10/21/2019 9:45 AM Medical Record Number: [id number] Patient Account Number: [id number] Date of Birth/Sex: 1927-12-25 (85 y.o. F) Treating RN: Army Melia Primary Care Aariyana Manz: [name], [name] Other Clinician: Referring Norah Fick: [name] Treating Jaquetta Currier/Extender: Melburn Hake, HOYT Weeks in Treatment: 0 Active Inactive Abuse / Safety / Falls / Self Care Management Nursing Diagnoses: Potential for falls Goals: Patient/caregiver will demonstrate safe use of adaptive devices to increase mobility Date Initiated: 10/21/2019 Target Resolution Date: 11/19/2019 Goal Status: Active Interventions: Assess fall risk on admission and as needed Notes: Orientation to the Wound Care Program Nursing Diagnoses: Knowledge deficit related to the wound healing center program Goals: Patient/caregiver will verbalize understanding of the [place] Program Date Initiated: 10/21/2019 Target Resolution Date: 11/12/2019 Goal Status: Active Interventions: Provide education on orientation to the wound center Notes: Pressure Nursing Diagnoses: Knowledge deficit related to management of pressures ulcers Goals: Patient/caregiver will verbalize risk factors for pressure ulcer development Date Initiated: 10/21/2019 Target Resolution Date: 11/19/2019 Goal Status: Active Interventions: Assess: immobility, friction, shearing, incontinence  upon admission and as needed Sebree, Keria L. ([id number]) Notes: Wound/Skin Impairment Nursing Diagnoses: Impaired tissue integrity Goals: Ulcer/skin breakdown will have a volume reduction of 30% by week 4 Date Initiated: 10/21/2019 Target Resolution Date: 11/19/2019 Goal Status: Active Interventions: Assess ulceration(s) every visit Notes: Electronic Signature(s) Signed: 10/22/2019 3:55:48 PM By: Gretta Cool, BSN, RN, CWS, Kim RN, BSN Signed: 10/25/2019 9:22:11 AM By: Army Melia Previous Signature: 10/21/2019 11:07:02 AM Version By: Army Melia Entered By: Gretta Cool BSN, RN, CWS, Kim on 10/22/2019 15:55:48 Joyce [name], [name] ([id number]) -------------------------------------------------------------------------------- Pain Assessment Details Patient Name: Joyce [name], [name] Date of Service: 10/21/2019 9:45 AM Medical Record Number: [id number] Patient Account Number: [id number] Date of Birth/Sex: 1928/07/20 (85 y.o. F) Treating RN: Army Melia Primary Care Mersadez Linden: [name] Other Clinician: Referring Myldred Raju: [name] Treating Avonna Iribe/Extender: Melburn Hake, HOYT Weeks in Treatment: 0 Active Problems Location of Pain Severity and Description of Pain Patient Has Paino No Site Locations Pain Management and Medication Current Pain Management: Electronic Signature(s) Signed: 10/22/2019 3:53:33 PM By: Gretta Cool, BSN, RN, CWS,  Maudie Mercury RN, BSN Signed: 10/25/2019 9:22:11 AM By: Army Melia Previous Signature: 10/21/2019 11:07:02 AM Version By: Army Melia Entered By: Gretta Cool BSN, RN, CWS, Kim on 10/22/2019 15:53:33 [place], Irven [place] ([id number]) -------------------------------------------------------------------------------- Patient/Caregiver Education Details Patient Name: Joyce [name], [name] Date of Service: 10/21/2019 9:45 AM Medical Record Number: [id number] Patient Account Number: [id number] Date of Birth/Gender: [birth year] (85 y.o. F) Treating RN: Army Melia Primary Care  Physician: [name] Other Clinician: Referring Physician: [name] Treating Physician/Extender: Sharalyn Ink in Treatment: 0 Education Assessment Education Provided To: Patient Education Topics Provided Wound/Skin Impairment: Handouts: Caring for Your Ulcer Methods: Demonstration, Explain/Verbal Responses: State content correctly Electronic Signature(s) Signed: 10/21/2019 11:07:02 AM By: Army Melia Entered By: Army Melia on 10/21/2019 10:49:24 Joyce [name], [name] ([id number]) -------------------------------------------------------------------------------- Wound Assessment Details Patient Name: Joyce [name], [name] Date of Service: 10/21/2019 9:45 AM Medical Record Number: [id number] Patient Account Number: [id number] Date of Birth/Sex: Feb 22, 1928 (85 y.o. F) Treating RN: Army Melia Primary Care Jakerria Kingbird: [name], [name] Other Clinician: Referring Ladell Lea: [name] Treating Norabelle Kondo/Extender: Melburn Hake, HOYT Weeks in Treatment: 0 Wound Status Wound Number: 2 Primary Pressure Ulcer Etiology: Wound Location: Sacrum - Midline Wound Open Wounding Event: Pressure Injury Status: Date Acquired: 12/19/2018 Comorbid Chronic Obstructive Pulmonary Disease Weeks Of Treatment: 0 History: (COPD), History of pressure wounds, Clustered Wound: No Osteoarthritis Photos Wound Measurements Length: (cm) 2.5 % Reduction i Width: (cm) 1.5 % Reduction i Depth: (cm) 0.3 Epithelializa Area: (cm) 2.945 Tunneling: Volume: (cm) 0.884 Undermining: n Area: 0% n Volume: 0% tion: Small (1-33%) No No Wound Description Classification: Category/Stage III Foul Odor Aft Wound Margin: Flat and Intact Slough/Fibrin Exudate Amount: Medium Exudate Type: Serous Exudate Color: amber er Cleansing: No o Yes Wound Bed Granulation Amount: Large (67-100%) Exposed Structure Granulation Quality: Pink Fascia Exposed: No Necrotic Amount: Small (1-33%) Fat Layer  (Subcutaneous Tissue) Exposed: Yes Necrotic Quality: Adherent Slough Tendon Exposed: No Muscle Exposed: No Joint Exposed: No Bone Exposed: No Treatment Notes Glosser, Rosali L. ([id number]) Wound #2 (Midline Sacrum) Notes Desitin to sacrum, Betadine and conform to right second toe Electronic Signature(s) Signed: 10/22/2019 3:53:45 PM By: Gretta Cool, BSN, RN, CWS, Kim RN, BSN Signed: 10/25/2019 9:22:11 AM By: Army Melia Previous Signature: 10/21/2019 1:45:06 PM Version By: [name] PA-C Previous Signature: 10/21/2019 4:33:56 PM Version By: Army Melia Previous Signature: 10/21/2019 11:07:02 AM Version By: Army Melia Entered By: Gretta Cool BSN, RN, CWS, Kim on 10/22/2019 15:53:45 Saulters, Reham L. ([id number]) -------------------------------------------------------------------------------- Wound Assessment Details Patient Name: Scialdone, Laurissa L. Date of Service: 10/21/2019 9:45 AM Medical Record Number: [id number] Patient Account Number: [id number] Date of Birth/Sex: 08/09/1928 (85 y.o. F) Treating RN: Army Melia Primary Care Hong Moring: [name], [name] Other Clinician: Referring Alleyah Twombly: [name] Treating Bjorn Hallas/Extender: Melburn Hake, HOYT Weeks in Treatment: 0 Wound Status Wound Number: 3 Primary Pressure Ulcer Etiology: Wound Location: Right Toe Second Wound Open Wounding Event: Pressure Injury Status: Date Acquired: 09/19/2019 Comorbid Chronic Obstructive Pulmonary Disease Weeks Of Treatment: 0 History: (COPD), History of pressure wounds, Clustered Wound: No Osteoarthritis Photos Wound Measurements Length: (cm) 0.7 Width: (cm) 0.7 Depth: (cm) 0.1 Area: (cm) 0.385 Volume: (cm) 0.038 % Reduction in Area: 0% % Reduction in Volume: 0% Epithelialization: None Wound Description Classification: Unstageable/Unclassified Exudate Amount: None Present Wound Bed Granulation Amount: None Present (0%) Necrotic Amount: Large (67-100%) Necrotic Quality:  Eschar Treatment Notes Wound #3 (Right Toe Second) Notes Desitin to sacrum, Betadine and conform to right second toe Electronic Signature(s) Signed: 10/22/2019 3:53:58 PM  By: Gretta Cool, BSN, RN, CWS, Kim RN, BSN Signed: 10/25/2019 9:22:11 AM By: Gerre Couch ([id number]) Previous Signature: 10/21/2019 11:07:02 AM Version By: Army Melia Entered By: Gretta Cool BSN, RN, CWS, Kim on 10/22/2019 15:53:58 Hem, Irven [place] ([id number]) -------------------------------------------------------------------------------- Vitals Details Patient Name: Holm, Maisen L. Date of Service: 10/21/2019 9:45 AM Medical Record Number: [id number] Patient Account Number: [id number] Date of Birth/Sex: 1928/09/03 (85 y.o. F) Treating RN: Army Melia Primary Care Anjoli [name]: [name], [name] Other Clinician: Referring Billal Rollo: [name], [name] Treating Tad Fancher/Extender: Melburn Hake, HOYT Weeks in Treatment: 0 Vital Signs Time Taken: 10:00 Temperature (F): 97.6 Height (in): 60 Pulse (bpm): 79 Source: Stated Respiratory Rate (breaths/min): 16 Weight (lbs): 118 Blood Pressure (mmHg): 141/63 Source: Measured Reference Range: 80 - 120 mg / dl Body Mass Index (BMI): 23 Electronic Signature(s) Signed: 10/22/2019 3:53:36 PM By: Gretta Cool, BSN, RN, CWS, Kim RN, BSN Entered By: Gretta Cool, BSN, RN, CWS, Kim on 10/22/2019 15:53:36

## 2019-10-25 NOTE — Progress Notes (Signed)
Joyce [name] ([id number]) Visit Report for 10/21/2019 Abuse/Suicide Risk Screen Details Patient Name: Joyce [name], [name] Date of Service: 10/21/2019 9:45 AM Medical Record Number: [id number] Patient Account Number: [id number] Date of Birth/Sex: 12-Oct-1928 (85 y.o. F) Treating RN: Joyce [name] Primary Care Joyce [name]: Joyce [name] Other Clinician: Referring Joyce [name]: Joyce [name] Treating Joyce [name]: Joyce [name]: 0 Abuse/Suicide Risk Screen Items Answer ABUSE RISK SCREEN: Has anyone close to you tried to hurt or harm you recentlyo No Do you feel uncomfortable with anyone in your familyo No Has anyone forced you do things that you didnot want to doo No Electronic Signature(s) Signed: 10/22/2019 3:54:26 PM By: Joyce Cool, BSN, RN, CWS, Kim RN, BSN Signed: 10/25/2019 9:22:11 AM By: Joyce [name] Previous Signature: 10/21/2019 11:07:02 AM Version By: Joyce [name] Entered By: Joyce Cool BSN, RN, CWS, [name] on 10/22/2019 15:54:25 Joyce [name], [name] ([id number]) -------------------------------------------------------------------------------- Activities of Daily Living Details Patient Name: [name], [name] Date of Service: 10/21/2019 9:45 AM Medical Record Number: [id number] Patient Account Number: [id number] Date of Birth/Sex: 07-02-1928 (85 y.o. F) Treating RN: Joyce [name] Primary Care Joyce [name]: Joyce [name] Other Clinician: Referring Joyce [name]: Joyce [name] Treating Marybel Alcott/Extender: Joyce [name]: 0 Activities of Daily Living Items Answer Activities of Daily Living (Please select one for each item) Drive Automobile Need Assistance Take Medications Completely Able Use Telephone Completely Able Care for Appearance Completely Able Use Toilet Completely Able Bath / Shower Completely Able Dress Self Completely Able Feed Self Completely Able Walk Completely Able Get In / Out Bed Completely Able Housework Completely  Able Prepare Meals Completely Able Handle Money Completely Able Shop for Self Completely Able Electronic Signature(s) Signed: 10/22/2019 3:54:31 PM By: Joyce Cool, BSN, RN, CWS, Kim RN, BSN Signed: 10/25/2019 9:22:11 AM By: Joyce [name] Previous Signature: 10/21/2019 11:07:02 AM Version By: Joyce [name] Entered By: Joyce Cool BSN, RN, CWS, [name] on 10/22/2019 15:54:30 Joyce [name], [name] ([id number]) -------------------------------------------------------------------------------- Education Screening Details Patient Name: Joyce [name], [name] Date of Service: 10/21/2019 9:45 AM Medical Record Number: [id number] Patient Account Number: [id number] Date of Birth/Sex: [birth year] (85 y.o. F) Treating RN: Joyce [name] Primary Care Joyce [name]: Joyce [name] Other Clinician: Referring Airyanna Dipalma: Joyce [name] Treating Joyce [name]: Joyce [name] in [name]: 0 Primary Learner Assessed: Patient Learning Preferences/Education Level/Primary Language Learning Preference: Explanation, Demonstration Highest Education Level: High School Preferred Language: English Cognitive Barrier Language Barrier: No Translator Needed: No Memory Deficit: No Emotional Barrier: No Cultural/Religious Beliefs Affecting Medical Care: No Physical Barrier Impaired Vision: Yes Glasses, readers Impaired Hearing: No Decreased Hand dexterity: No Knowledge/Comprehension Knowledge Level: High Comprehension Level: High Ability to understand written High instructions: Ability to understand verbal High instructions: Motivation Anxiety Level: Calm Cooperation: Cooperative Education Importance: Acknowledges Need Interest in Health Problems: Asks Questions Perception: Coherent Willingness to Engage in Self- High Management Activities: Readiness to Engage in Self- High Management Activities: Electronic Signature(s) Signed: 10/22/2019 3:54:36 PM By: Joyce Cool, BSN, RN, CWS, Kim RN, BSN Signed: 10/25/2019  9:22:11 AM By: Joyce [name] Previous Signature: 10/21/2019 11:07:02 AM Version By: Joyce [name] Entered By: Joyce Cool BSN, RN, CWS, [name] on 10/22/2019 15:54:36 Joyce [name] ([id number]) -------------------------------------------------------------------------------- Fall Risk Assessment Details Patient Name: Joyce [name], [name] Date of Service: 10/21/2019 9:45 AM Medical Record Number: [id number] Patient Account Number: [id number] Date of Birth/Sex: 12-Mar-1928 (85 y.o. F) Treating RN: Joyce [name] Primary Care Joyce [name]: Joyce [name] Other Clinician: Referring Joyce [name]: Joyce [name] Treating Joyce [name]: Joyce [name]: 0 Fall Risk Assessment Items  Have you had 2 or more falls in the last 12 monthso 0 No Have you had any fall that resulted in injury in the last 12 monthso 0 No FALLS RISK SCREEN History of falling - immediate or within 3 months 25 Yes Secondary diagnosis (Do you have 2 or more medical diagnoseso) 0 No Ambulatory aid None/bed rest/wheelchair/nurse 0 No Crutches/cane/walker 15 Yes Furniture 0 No Intravenous therapy Access/Saline/Heparin Lock 0 No Gait/Transferring Normal/ bed rest/ wheelchair 0 No Weak (short steps with or without shuffle, stooped but able to lift head while 0 No walking, may seek support from furniture) Impaired (short steps with shuffle, may have difficulty arising from chair, head 20 Yes down, impaired balance) Mental Status Oriented to own ability 0 No Electronic Signature(s) Signed: 10/22/2019 3:54:42 PM By: Joyce Cool, BSN, RN, CWS, Kim RN, BSN Signed: 10/25/2019 9:22:11 AM By: Joyce [name] Previous Signature: 10/21/2019 11:07:02 AM Version By: Joyce [name] Entered By: Joyce [name] on 10/22/2019 15:54:41 Joyce [name], [name] ([id number]) -------------------------------------------------------------------------------- Foot Assessment Details Patient Name: Joyce [name], [name] Date of Service: 10/21/2019  9:45 AM Medical Record Number: [id number] Patient Account Number: [id number] Date of Birth/Sex: 1927/11/09 (85 y.o. F) Treating RN: Cornell Barman Primary Care Cathi Hazan: Joyce [name] Other Clinician: Referring Tiyonna Sardinha: Joyce [name] Treating Dax Murguia/Extender: Joyce [name]: 0 Foot Assessment Items Site Locations + = Sensation present, - = Sensation absent, C = Callus, U = Ulcer R = Redness, W = Warmth, M = Maceration, PU = Pre-ulcerative lesion F = Fissure, S = Swelling, D = Dryness Assessment Right: Left: Other Deformity: No No Prior Foot Ulcer: No No Prior Amputation: No No Charcot Joint: No No Ambulatory Status: Gait: Electronic Signature(s) Signed: 10/22/2019 3:55:14 PM By: Joyce Cool, BSN, RN, CWS, Kim RN, BSN Previous Signature: 10/22/2019 3:55:08 PM Version By: Joyce Cool, BSN, RN, CWS, Kim RN, BSN Entered By: Joyce [name] on 10/22/2019 15:55:14 Joyce [name], [name] ([id number]) -------------------------------------------------------------------------------- Nutrition Risk Screening Details Patient Name: Joyce [name], [name] Date of Service: 10/21/2019 9:45 AM Medical Record Number: [id number] Patient Account Number: [id number] Date of Birth/Sex: [birth year] (85 y.o. F) Treating RN: Joyce [name] Primary Care Kadir Azucena: Joyce [name] Other Clinician: Referring Shawni Volkov: Joyce [name] Treating Anam Bobby/Extender: Joyce [name]: 0 Height (in): 60 Weight (lbs): 118 Body Mass Index (BMI): 23 Nutrition Risk Screening Items Score Screening NUTRITION RISK SCREEN: I have an illness or condition that made me change the kind and/or amount of 0 No food I eat I eat fewer than two meals per day 0 No I eat few fruits and vegetables, or milk products 0 No I have three or more drinks of beer, liquor or wine almost every day 0 No I have tooth or mouth problems that make it hard for me to eat 0 No I don't always have enough  money to buy the food I need 0 No I eat alone most of the time 0 No I take three or more different prescribed or over-the-counter drugs a day 0 No Without wanting to, I have lost or gained 10 pounds in the last six months 0 No I am not always physically able to shop, cook and/or feed myself 0 No Nutrition Protocols Good Risk Protocol 0 No interventions needed Moderate Risk Protocol High Risk Proctocol Risk Level: Good Risk Score: 0 Electronic Signature(s) Signed: 10/22/2019 3:55:01 PM By: Joyce Cool, BSN, RN, CWS, Kim RN, BSN Signed: 10/25/2019 9:22:11 AM By: Joyce [name] Previous Signature: 10/21/2019 11:07:02  AM Version By: Joyce [name] Entered By: Joyce Cool BSN, RN, CWS, [name] on 10/22/2019 15:55:01

## 2019-11-08 ENCOUNTER — Other Ambulatory Visit: Payer: Self-pay

## 2019-11-08 ENCOUNTER — Other Ambulatory Visit: Payer: Medicare Other | Admitting: Nurse Practitioner

## 2019-11-08 ENCOUNTER — Encounter: Payer: Self-pay | Admitting: Nurse Practitioner

## 2019-11-08 DIAGNOSIS — J449 Chronic obstructive pulmonary disease, unspecified: Secondary | ICD-10-CM | POA: Diagnosis not present

## 2019-11-08 DIAGNOSIS — Z515 Encounter for palliative care: Secondary | ICD-10-CM | POA: Diagnosis not present

## 2019-11-08 NOTE — Progress Notes (Signed)
[facility] Consult Note Telephone: [phone]  Fax: [phone]  PATIENT NAME: Joyce [name] DOB: 08-04-1928 MRN: [id number]  PRIMARY CARE PROVIDER:   Jerrol Banana., MD  REFERRING PROVIDER:  Jerrol Banana., MD [address] [place],  [place] [address] RESPONSIBLE PARTY:[name] Daniel Nones daughter [id number]  Due to the COVID-19 crisis, this visit was done via telemedicine from my office and it was initiated and consent by this patient and or family.  RECOMMENDATIONS and PLAN: 1.ACP: DNR; continue to treat conservatively, treat what is treatable  2.Memory loss appears progressive. Medical goals to continue to focus on Comfort, redirecting with supportive measures.  3.Anorexia appetitecontinues to improve but onset of more weight loss. Continue to encourage supplements, frequent meals  4.Palliative care encounter ; Palliative medicine team will continue to support patient, patient's family, and medical team. Visit consisted of counseling and education dealing with the complex and emotionally intense issues of symptom management and palliative care in the setting of serious and potentially life-threatening illness  I spent 35 minutes providing this consultation,  from 3:00pm to 3:35pm. More than 50% of the time in this consultation was spent coordinating communication.   HISTORY OF PRESENT ILLNESS:  Joyce [name] is a 86 y.o. year old female with multiple medical problems including COPD, enlarged aorta, breast cancer s/p, thyroid cancer s/p thyroidectomy, peptic ulcer disease, hypertension, GERD, asthma, anxiety, Severe kyphosis . I called Joyce [name], Joyce [name]'s daughter for scheduled palliative care telemedicine telephonic visit as video not available. We talked about how Joyce [name] has been feeling. Joyce [name] endorses that she see a slow decline. Joyce. [name] had more of a weight loss now down to 118 lb from 125  lb. Joyce [name]'s appetite seems fair. We talked about going to the wound care center for the sacral wound. Joyce [name] endorses that they did not put a dressing on word as it would not stay where the wound is located. We talked about chronic disease progression. We talked about medical goals of care. Joyce [name] endorses that she feels like they're not going to return to the wound care center that she can take care of the wound at home. We talked about the overall decline with memory, cognition. We talked about adl's and declined where she's requiring more help. We talked about Hospice Services being a Medicare benefit. We talked about hospice criteria. We talked about the option of having nursing and social work through palliative care visit monthly. Joyce [name] endorses her wishes are to wait on hospice for now and continue to monitor. Joyce [name] endorses she would like nursing and social work to return monthly to continue to closely monitor. We talked to that realistic expectations with progressive decline. Therapeutic listening and emotional support provided. Questions answered the satisfaction. Contact information provided. Notified nursing / social work palliative care. Palliative Care was asked to help to continue to address goals of care.   CODE STATUS: DNR  PPS: 40% HOSPICE ELIGIBILITY/DIAGNOSIS: TBD  PAST MEDICAL HISTORY:  Past Medical History:  Diagnosis Date  . Anxiety   . Arthritis   . Asthma   . Asthma   . Breast cancer ([facility])   . Cancer ([facility])   . COPD (chronic obstructive pulmonary disease) ([facility])   . Enlarged aorta ([facility])   . GERD (gastroesophageal reflux disease)   . High cholesterol   . Hx of skin cancer, basal cell   . Hyperlipemia   . Hypertension   . PUD (peptic  ulcer disease)   . Thyroid cancer ([facility])     SOCIAL HX:  Social History   Tobacco Use  . Smoking status: Never Smoker  . Smokeless tobacco: Never Used  Substance Use Topics  . Alcohol use: Not Currently    ALLERGIES: No Known  Allergies   PERTINENT MEDICATIONS:  Outpatient Encounter Medications as of 11/08/2019  Medication Sig  . acetaminophen (TYLENOL) 325 MG tablet Take 650 mg by mouth 2 (two) times daily. Taking two tablets every morning.  Marland Kitchen acidophilus (RISAQUAD) CAPS capsule Take 1 capsule by mouth daily.  Marland Kitchen ADVAIR DISKUS 250-50 MCG/DOSE AEPB INHALE 1 PUFF BY MOUTH TWICE DAILY. RINSE MOUTH WITH WATER AFTER USE TO REDUCE AFTERTASTE AND INCIDENCE OF CANDIDIASIS. DO NOT SWALLOW  . albuterol (PROVENTIL HFA;VENTOLIN HFA) 108 (90 Base) MCG/ACT inhaler Inhale 1-2 puffs into the lungs every 4 (four) hours as needed for wheezing or shortness of breath.  Marland Kitchen albuterol (PROVENTIL) (2.5 MG/3ML) 0.083% nebulizer solution Take 3 mLs (2.5 mg total) by nebulization every 6 (six) hours as needed for wheezing or shortness of breath.  Marland Kitchen alendronate (FOSAMAX) 70 MG tablet TAKE 1 TABLET BY MOUTH ONCE A WEEK WITH A FULL GLASS OF WATER ON AN EMPTY STOMACH  . atorvastatin (LIPITOR) 10 MG tablet TAKE 1 TABLET BY MOUTH DAILY  . calcium carbonate (TUMS - DOSED IN MG ELEMENTAL CALCIUM) 500 MG chewable tablet Chew 2 tablets by mouth daily.   . cholecalciferol (VITAMIN D) 1000 units tablet Take 2,000 Units by mouth daily.  . clotrimazole (CLOTRIMAZOLE ATHLETES FOOT) 1 % cream Apply 1 application topically 2 (two) times daily. Between toes, not on any open skin/ for fungus  . DILT-XR 120 MG 24 hr capsule TAKE 2 CAPSULES BY MOUTH EVERY MORNING AND 1 CAPSULE EVERY EVENING  . diltiazem (CARDIZEM CD) 120 MG 24 hr capsule TK 2 CS PO QAM AND 1 C QPM  . doxycycline (VIBRA-TABS) 100 MG tablet Take 1 tablet (100 mg total) by mouth 2 (two) times daily.  . feeding supplement, ENSURE ENLIVE, (ENSURE ENLIVE) LIQD Take 237 mLs by mouth 2 (two) times daily between meals.  . Fluticasone-Salmeterol (ADVAIR DISKUS) 250-50 MCG/DOSE AEPB Inhale 1 puff into the lungs 2 (two) times daily.  Marland Kitchen levofloxacin (LEVAQUIN) 500 MG tablet Take 1 tablet (500 mg total) by mouth  daily.  Marland Kitchen levothyroxine (SYNTHROID) 100 MCG tablet Take 1 tablet (100 mcg total) by mouth daily.  Marland Kitchen LORazepam (ATIVAN) 0.5 MG tablet Take 0.5 tablets (0.25 mg total) by mouth 2 (two) times daily as needed for anxiety.  . mirtazapine (REMERON) 15 MG tablet Take 1 tablet (15 mg total) by mouth at bedtime.  . Multiple Vitamin (MULTIVITAMIN WITH MINERALS) TABS tablet Take 1 tablet by mouth daily.  . mupirocin ointment (BACTROBAN) 2 % Apply 1 application topically 2 (two) times daily. Apply very small thin amount after cleaning with soap and water.   No facility-administered encounter medications on file as of 11/08/2019.    PHYSICAL EXAM:   Deferred  Solace Wendorff Z Arra Connaughton, NP

## 2019-11-09 ENCOUNTER — Telehealth: Payer: Self-pay

## 2019-11-09 NOTE — Telephone Encounter (Signed)
Daughter returned RNs call to schedule palliative care visit.  Daughter in agreement with palliative team visiting on 11/10/19 at 11:00 AM.

## 2019-11-09 NOTE — Telephone Encounter (Signed)
Telephone call placed to patients [name] to schedule palliative care visit.  [name] did not answer phone.  RN left message requesting call back to schedule visit.

## 2019-11-10 ENCOUNTER — Other Ambulatory Visit: Payer: Medicare Other

## 2019-11-10 ENCOUNTER — Encounter: Payer: Self-pay | Admitting: Family Medicine

## 2019-11-10 ENCOUNTER — Other Ambulatory Visit: Payer: Self-pay

## 2019-11-10 DIAGNOSIS — Z515 Encounter for palliative care: Secondary | ICD-10-CM

## 2019-11-10 NOTE — Progress Notes (Signed)
COMMUNITY PALLIATIVE CARE SW NOTE  PATIENT NAME: Joyce [name] DOB: 02-Dec-1927 MRN: [id number]  PRIMARY CARE PROVIDER: Jerrol [name]., MD  RESPONSIBLE PARTY:  Acct ID - Guarantor Home Phone Work Phone Relationship Acct Type  [id number] [name], [name]  Self P/F     [address] [place], [place] [address]     PLAN OF CARE and INTERVENTIONS:             1. GOALS OF CARE/ ADVANCE CARE PLANNING:  Patient is a DNR,form is in the home.HCPOAis Joyce [name] (patient's daughter).Living Joyce [name] complete. Goal isfor patientto remain at home. 2. SOCIAL/EMOTIONAL/SPIRITUAL ASSESSMENT/ INTERVENTIONS:  SW and RN met with patient and Joyce [name] in the home. Patient reports doing "well" and "about the same". Patient was sitting up in her recliner. Patient discussed her new wounds on her right second toe and her bottom. RN assessed and provided recommendation. RN encouraged follow-up with wound clinic. Team discussed hospital bed to help with wound healing and positioning. Patient is unsure and was anxious during this conversation. Patient is sleeping well. Appetite is good and Joyce [name] noted that she is trying to increase patient's protein intake. Patient does have a cough at times and gets SOB exertion. SW provided supportive counseling, discussed recommendations for care and used active and reflective listening. 3. PATIENT/CAREGIVER EDUCATION/ COPING:  Patient was alert, engaged. Patient is forgetful at times. Patient expressed that change is difficult and the hospital bed would be an adjustment for her. Team encouraged ongoing discussion with family. Family is supportive and encouraging. 4. PERSONAL EMERGENCY PLAN:  Family will call [id number]. [place] 5. COMMUNITY RESOURCES COORDINATION/ HEALTH CARE NAVIGATION:  Joyce Marylandhelps to coordinate patient's care. Patient has a wound clinic appointment on 1/7. Patient may need home health again for wound care, to be  discussed. No other appointments scheduled at this time. 6. FINANCIAL/LEGAL CONCERNS/INTERVENTIONS:  None.     SOCIAL HX:  Social History   Tobacco Use  . Smoking status: Never Smoker  . Smokeless tobacco: Never Used  Substance Use Topics  . Alcohol use: Not Currently    CODE STATUS:   Code Status: Prior (DNR) ADVANCED DIRECTIVES: Y MOST FORM COMPLETE:  No. HOSPICE EDUCATION PROVIDED: None.  PPS: Patient is able to toilet and feed herself.Joyce Marylandhelps patient withbathing. Patientis ambulatingwithherwalker.  I spent469mutes with patient/family, [id number], support and consultation.  Joyce LombardLCSW

## 2019-11-10 NOTE — Progress Notes (Signed)
PATIENT NAME: Joyce [name] DOB: 21-Feb-1928 MRN: [id number]  PRIMARY CARE PROVIDER: Jerrol Banana., MD  RESPONSIBLE PARTY:  Acct ID - Guarantor Home Phone Work Phone Relationship Acct Type  [id number] CASSEDY, [phone]  Self P/F     [address], [place], [place] [address]    PLAN OF CARE and INTERVENTIONS:               1.  GOALS OF CARE/ ADVANCE CARE PLANNING:  Remain in home with daughter and son in law               2.  PATIENT/CAREGIVER EDUCATION:  Education on need to reposition frequently to prevent skin breakdown, education on s/s of infection, education on fall precautions, support               3.  DISEASE STATUS:SW and RN made scheduled home care visit. Patient sitting in her recliner in her room. Patients daughter Joyce [name] in home with patient. Patient was referred back to [facility] palliative care team do to 4 pound weight loss and worsening skin breakdown. Patient currently seeing MD at the wound center. Patient has hammer toe on right foot. Patient developed a blister on 1st toe beside of great toe. Area has healing yellow scab over area. Patients sacral area has stage 2 breakdown that is worse since nurse saw patient last. Patient has undermining between 10:00 and 12:00 o'clock.  Education to daughter that patient needs to sleep in hospital bed rather than recliner chair. Patient has been reluctant to accept hospital bed. Nurse also made recommendation that [facility] referral may be necessary for wound care since wound appears to be getting worse.  Daughter states she will talk with wound care center this week. Patient does complain of discomfort at wound between sacral folds.  Patient reports she is eating better and enjoys nutritional shakes. Patient has rhonchi in upper anterior lung lobe. Daughter reports MD made recommendation to increase patients  Mirtazapine to 30 mg at night time. Daughter states she has been reluctant to do this. Nurse instructed daughter to try giving  patient 15mg  tab and half of another 15 mg tab of Mirtazapine. Patient continues to use Lorazepam occasionally. Patient's vital signs are stable. Patient continues to be able to ambulate with her rollator walker. Patient and daughter in agreement with palliative care services and both were encouraged to contact palliative care with questions or concerns.      HISTORY OF PRESENT ILLNESS:  Patient is a 86 year old female who resides in home with her daughter Joyce [name] and Joyce [name]'s husband.  Patient was referred back to [facility] team due to weight loss and worsening of sacral wound.  Patient will be seen monthly and PRN by palliative care team.   CODE STATUS: DNR  ADVANCED DIRECTIVES: Y MOST FORM: No PPS: 50%   PHYSICAL EXAM:   VITALS: Today's Vitals   11/10/19 1115  BP: 140/70  [name]: 75  Resp: 18  Temp: 99.2 F (37.3 C)  TempSrc: Temporal  SpO2: 96%  Weight: 122 lb (55.3 kg)  PainSc: 2   PainLoc: Buttocks    LUNGS: ronchi in anterior right upper lung CARDIAC: Cor RRR  EXTREMITIES: 1+ edema SKIN: Stage II decub between sacral folds, Daughter has been applying [place] to area.  NEURO: positive for gait problems       [name], RN

## 2019-11-11 ENCOUNTER — Other Ambulatory Visit: Payer: Self-pay

## 2019-11-11 ENCOUNTER — Encounter: Payer: Medicare Other | Attending: Physician Assistant | Admitting: Physician Assistant

## 2019-11-11 DIAGNOSIS — I10 Essential (primary) hypertension: Secondary | ICD-10-CM | POA: Insufficient documentation

## 2019-11-11 DIAGNOSIS — M1990 Unspecified osteoarthritis, unspecified site: Secondary | ICD-10-CM | POA: Diagnosis not present

## 2019-11-11 DIAGNOSIS — L89890 Pressure ulcer of other site, unstageable: Secondary | ICD-10-CM | POA: Diagnosis not present

## 2019-11-11 DIAGNOSIS — I872 Venous insufficiency (chronic) (peripheral): Secondary | ICD-10-CM | POA: Diagnosis not present

## 2019-11-11 DIAGNOSIS — L89153 Pressure ulcer of sacral region, stage 3: Secondary | ICD-10-CM | POA: Insufficient documentation

## 2019-11-11 DIAGNOSIS — M419 Scoliosis, unspecified: Secondary | ICD-10-CM | POA: Insufficient documentation

## 2019-11-11 DIAGNOSIS — I890 Lymphedema, not elsewhere classified: Secondary | ICD-10-CM | POA: Insufficient documentation

## 2019-11-11 DIAGNOSIS — J449 Chronic obstructive pulmonary disease, unspecified: Secondary | ICD-10-CM | POA: Diagnosis not present

## 2019-11-11 DIAGNOSIS — L89159 Pressure ulcer of sacral region, unspecified stage: Secondary | ICD-10-CM | POA: Diagnosis not present

## 2019-11-11 DIAGNOSIS — L89899 Pressure ulcer of other site, unspecified stage: Secondary | ICD-10-CM | POA: Diagnosis not present

## 2019-11-11 NOTE — Progress Notes (Addendum)
MONYA, DEADRICK ([id number]) Visit Report for 11/11/2019 Chief Complaint Document Details Patient Name: Joyce [name], [name] Date of Service: 11/11/2019 10:30 AM Medical Record Number: [id number] Patient Account Number: [id number] Date of Birth/Sex: Aug 18, 1928 (86 y.o. F) Treating RN: Army Melia Primary Care Provider: [name] Other Clinician: Referring Provider: [name] Treating Provider/Extender: Melburn Hake, HOYT Weeks in Treatment: 3 Information Obtained from: Patient Chief Complaint Sacral pressure ulcer and toe ulcer Electronic Signature(s) Signed: 11/11/2019 10:50:53 AM By: [name] PA-C Entered By: [name] on 11/11/2019 10:50:53 [place], Beatryce L. ([id number]) -------------------------------------------------------------------------------- HPI Details Patient Name: Joyce [name], [name] Date of Service: 11/11/2019 10:30 AM Medical Record Number: [id number] Patient Account Number: [id number] Date of Birth/Sex: 11-01-1928 (86 y.o. F) Treating RN: Army Melia Primary Care Provider: [name] Other Clinician: Referring Provider: [name] Treating Provider/Extender: Sharalyn Ink in Treatment: 3 History of Present Illness HPI Description: 05/14/19 on evaluation today patient appears to be doing rather well in regard to her lower extremities compared to what it sounds like she was doing not that far back. Fortunately there's no signs of active infection at this time although it does sound like she had significant cellulitis earlier in the year. She does also have a issue with a sacral ulcer upon inspection today which has been present since around February 2020 this is about as long as she's been dealing with her legs as well. She is under palliative care but not hospice. The patient does have a history of scoliosis and kyphosis which makes it difficult for her to lay flat she actually sleep sitting up in a recliner but night and  reclined at this cause her trouble breathing. She also has COPD, hypertension, and bilateral lower should be lymphedema and venous stasis. Currently there's no signs of active cellulitis of the bilateral lower extremities which is good news. The patient has not had any vascular intervention nor evaluation that we have on file. She does spend the majority per day apparently sitting in her recliner upright. This I think is an issue both for her legs as well as for the sacral region unfortunately. She could not tolerate the ABI's today due to the fact that it calls too much pain when we were performing the blood pressure reading on her lower extremities who were not even able to get a proper reading. No fevers, chills, nausea, or vomiting noted at this time. 05/25/19 on evaluation today patient appears to be doing about the same in regard to her sacral wound maybe slightly better compared to previous. Fortunately there's no signs of active infection at this time which is good news in regard to her lower extremities. She has previously had cellulitis therefore were trying to keep an eye on this as far as that's concerned. Nonetheless since I last saw her which was actually roughly 2 weeks ago since due to work her daughter was unable to bring her last week she is not had any care through home health and has not gotten compression stockings for her bilateral lower extremities. Subsequently the issue with home health seems to be multifactorial when they contacted the patient and her daughter in order to get things scheduled they were told according to the daughter that she didn't know why they needed to come out three times a week and wasn't aware that that was what the order was gonna be. The daughter tells me that that somewhat poor timing for her as she works and  doesn't have a lot of time to hang around at the house waiting for home health. With that being said I explained that the only thing we can  really do is either have home health come out and perform the dressing changes or else potentially have a family member do this. There apparently is no family member including herself who is willing to do this she tells me "that she doesn't feel qualified and comfortable". Nonetheless either way the patient has not had a dressing on the sacral area since the last time she was here she uses a pad hasn't really noted much drainage onto the pad although it does appear that the wound itself trapped a lot of the fluid within skin folds. In the end I do think that since she doesn't really have anyone to perform the dressing changes at home we are going to need to utilize the services of home health. They are in agreement with that plan. 06/01/19 on evaluation today patient presents for follow-up concerning her wound in the midline sacral area. This seems to be doing well there is some evidence of new skin growth there still an open sore however it is gonna require dressing. Home health is coming out to see her I think we do need to see about getting her a better schedule where potentially she comes entire clinic on Monday and then on Wednesday and Friday his home health change in her dressing as it stands right now I feel like this is happening to a regular for her. Fortunately there's no signs of any infection at this time her legs are doing well although she does have the compression stockings finally she doesn't actually have those on as of today. They want [place] to show them how to put them on. 06/08/19 on evaluation today patient came in today without any dressing on her sacral region. Apparently this fell off around Friday and unfortunately it was supposed to be reapplied Friday but the patient had an appointment scheduled for 11 o'clock and her home health nurse was supposed to arrive at 52. She was running late and therefore they called and canceled her coming out to perform the dressing change and  subsequently even though they had dressing material at home her daughter refuses to apply the dressing to the wound. For that reason she went all weekend without any dressing until today. She also has gotten the compression stockings but has just use the Tubigrip on occasion although she doesn't even have that on today. Apparently her daughter stating that the compression stockings are too hard to get on and therefore they're not able to do that. Nonetheless as I stated patient came in with nothing on her lower extremities as far as compression is concerned. They do want to see about extending her visits to every two weeks. JAMIEE, ESPERICUETA ([id number]) 07/02/19 on evaluation today patient presents after apparently having been quarantined for 14 days due to having been exposed to someone with Covid-19 Virus during the time that she is been quarantined she has not actually had her dressing changed it all apparently nobody would come around her including her daughter. Nonetheless her daughter hasn't been changing the dressing even prior they did have a home health nurse coming out but it Gillermina Phy may have been about a week's worth of time that the patient actually had home health change in the dressing and even then there was some issues with timing and them actually coming out an appropriate time  that the patient and her daughter felt was within reason. Nonetheless they feel like they had a much better experience with [facility] in the past. 07/09/2019 on evaluation today patient appears to be doing unfortunately somewhat the same in regard to her wound. She really has not had a dressing on since we put one on last week. Then her daughter really is not able to perform the dressing changes the patient tells me that she has a hard time with the location of the wound had been able to perform the dressing change at this site. Nonetheless they also had issues with home health they wanted if we would  initiate home health therapy to have a different company come out. 10/21/2019 on evaluation today patient presents for follow-up concerning the same issue on her midline sacral region. This is the same area that I was taken care of back last in September and that is the last time I saw her. We have never actually healed this out and at that point I did actually recommend that we were using dressings unfortunately the dressings just never worked out for her. She could not keep these in place. For that reason I did actually suggest that we could try some creams but again then she never came back for follow-up but I am not really sure exactly what they have been doing in the interim. She has not really noted any significant bleeding and drainage has been she tells me fairly minimal both of which are good signs. With that being said unfortunately she still has an open wound which I explained is still a risk factor for infection. She also has an area on her second toe of the right foot right at the bend of the toe where this is somewhat hammered and subsequently rubs on her shoe. I think this too is a pressure/friction injury and subsequently it seems to be doing much better she has been on antibiotics both oral as well as topical mupirocin and taken by mouth she is unsure of what the antibiotic was. However she is also been using some antifungal cream around the toes in general. Overall the toe does seem to be doing much better with no obvious signs of infection at this point 11/11/2019 on evaluation today patient appears to be doing well overall with regard to her wounds. She has been tolerating the dressing changes without complication. We did recommend however that she use Betadine for her toe area they have been using an antibiotic ointment instead that they had. I explained the rationale for why we use the Betadine in order to try to keep this clean and dry preventing infection and gangrene.  Nonetheless I think that is still probably the best bet for the toe area. It does not appear to be infected right now and really has not softened up enough to be debrided away in my opinion. With regard to her sacral region this is still something that seems to be doing okay in my opinion I think that she is tolerating the zinc applications and while I would prefer to have a dressing on this area we really just have not been able to keep anything in the spot in order to allow it to heal appropriately. For that reason my suggestion is good to be that we go ahead and continue with the zinc I do see some evidence of new epithelization around the edges of the wound. I do believe that she still needs to focus on trying  to offload and her home aide nurse has mentioned the possibility of getting a hospital bed I think a hospital bed with an air mattress would be of benefit for her. Electronic Signature(s) Signed: 11/11/2019 2:05:08 PM By: [name] PA-C Entered By: [name] on 11/11/2019 14:05:08 Blanchett, Nenzel ([id number]) -------------------------------------------------------------------------------- Physical Exam Details Patient Name: Joyce [name], [name] Date of Service: 11/11/2019 10:30 AM Medical Record Number: [id number] Patient Account Number: [id number] Date of Birth/Sex: [birth year] (86 y.o. F) Treating RN: Army Melia Primary Care Provider: [name] Other Clinician: Referring Provider: [name], RICHARD Treating Provider/Extender: Melburn Hake, HOYT Weeks in Treatment: 3 Constitutional Well-nourished and well-hydrated in no acute distress. Respiratory normal breathing without difficulty. Psychiatric this patient is able to make decisions and demonstrates good insight into disease process. Alert and Oriented x 3. pleasant and cooperative. Notes Based on my evaluation today it appears that the patient is showing some signs of new epithelization around the edges of  the wound in the sacral region. Obviously this still has a way to go to heal but nonetheless I feel like is doing okay. With regard to her toe ulcer this is still very stable and fairly hard I do not think that debridement would be the best option I think the best thing would be to leave this is stable eschar in place utilizing Betadine to try to keep this as clean and dry as possible. Eventually will start to flake off on its own. Electronic Signature(s) Signed: 11/11/2019 2:05:53 PM By: [name] PA-C Entered By: [name] on 11/11/2019 14:05:53 Wuellner, Irven [place] ([id number]) -------------------------------------------------------------------------------- Physician Orders Details Patient Name: Joyce [name], [name] Date of Service: 11/11/2019 10:30 AM Medical Record Number: [id number] Patient Account Number: [id number] Date of Birth/Sex: 09-08-1928 (86 y.o. F) Treating RN: Army Melia Primary Care Provider: [name], [name] Other Clinician: Referring Provider: [name] Treating Provider/Extender: Melburn Hake, HOYT Weeks in Treatment: 3 Verbal / Phone Orders: No Diagnosis Coding ICD-10 Coding Code Description L89.153 Pressure ulcer of sacral region, stage 3 L89.890 Pressure ulcer of other site, unstageable I89.0 Lymphedema, not elsewhere classified I87.2 Venous insufficiency (chronic) (peripheral) I10 Essential (primary) hypertension J44.9 Chronic obstructive pulmonary disease, unspecified Wound Cleansing Wound #2 Midline Sacrum o Clean wound with Normal Saline. - in office o Dial antibacterial soap, wash wounds, rinse and pat dry prior to dressing wounds Wound #3 Right Toe Second o Clean wound with Normal Saline. - in office o Dial antibacterial soap, wash wounds, rinse and pat dry prior to dressing wounds Skin Barriers/Peri-Wound Care Wound #2 Midline Sacrum o Barrier cream - to sacrum Wound #3 Right Toe Second o Barrier cream - to sacrum o  Other: - betadine to toe Secondary Dressing Wound #3 Right Toe Second o Gauze and Kerlix/Conform - gauze and conform wrap Dressing Change Frequency Wound #2 Midline Sacrum o Change dressing every day. - right second toe o Other: - Midline sacrum, 3 times daily Wound #3 Right Toe Second o Change dressing every day. - right second toe o Other: - Midline sacrum, 3 times daily o Change dressing every day. - right second toe o Other: - Midline sacrum, 3 times daily Kawa, Sahra L. ([id number]) Follow-up Appointments Wound #2 Midline Sacrum o Return Appointment in 2 weeks. Wound #3 Right Toe Second o Return Appointment in 2 weeks. Electronic Signature(s) Signed: 11/11/2019 11:26:15 AM By: Army Melia Signed: 11/12/2019 9:46:35 AM By: [name] PA-C Entered By: Army Melia on 11/11/2019 11:10:02 Rybka,  [place] ([id number]) -------------------------------------------------------------------------------- Problem List Details Patient Name: Joyce [name], [name] Date of Service: 11/11/2019 10:30 AM Medical Record Number: [id number] Patient Account Number: [id number] Date of Birth/Sex: May 01, 1928 (86 y.o. F) Treating RN: Army Melia Primary Care Provider: [name] Other Clinician: Referring Provider: [name], [name] Treating Provider/Extender: Sharalyn Ink in Treatment: 3 Active Problems ICD-10 Evaluated Encounter Code Description Active Date Today Diagnosis L89.153 Pressure ulcer of sacral region, stage 3 10/21/2019 No Yes L89.890 Pressure ulcer of other site, unstageable 10/21/2019 No Yes I89.0 Lymphedema, not elsewhere classified 10/21/2019 No Yes I87.2 Venous insufficiency (chronic) (peripheral) 10/21/2019 No Yes I10 Essential (primary) hypertension 10/21/2019 No Yes J44.9 Chronic obstructive pulmonary disease, unspecified 10/21/2019 No Yes Inactive Problems Resolved Problems Electronic Signature(s) Signed: 11/11/2019 10:50:47 AM By: [name] PA-C Entered By: [name] on 11/11/2019 10:50:46 Fendrick, Leaha L. ([id number]) -------------------------------------------------------------------------------- Progress Note Details Patient Name: Joyce [name], [name] Date of Service: 11/11/2019 10:30 AM Medical Record Number: [id number] Patient Account Number: [id number] Date of Birth/Sex: [birth year] (86 y.o. F) Treating RN: Army Melia Primary Care Provider: [name], [name] Other Clinician: Referring Provider: [name] Treating Provider/Extender: Sharalyn Ink in Treatment: 3 Subjective Chief Complaint Information obtained from Patient Sacral pressure ulcer and toe ulcer History of Present Illness (HPI) 05/14/19 on evaluation today patient appears to be doing rather well in regard to her lower extremities compared to what it sounds like she was doing not that far back. Fortunately there's no signs of active infection at this time although it does sound like she had significant cellulitis earlier in the year. She does also have a issue with a sacral ulcer upon inspection today which has been present since around February 2020 this is about as long as she's been dealing with her legs as well. She is under palliative care but not hospice. The patient does have a history of scoliosis and kyphosis which makes it difficult for her to lay flat she actually sleep sitting up in a recliner but night and reclined at this cause her trouble breathing. She also has COPD, hypertension, and bilateral lower should be lymphedema and venous stasis. Currently there's no signs of active cellulitis of the bilateral lower extremities which is good news. The patient has not had any vascular intervention nor evaluation that we have on file. She does spend the majority per day apparently sitting in her recliner upright. This I think is an issue both for her legs as well as for the sacral region unfortunately. She could not tolerate  the ABI's today due to the fact that it calls too much pain when we were performing the blood pressure reading on her lower extremities who were not even able to get a proper reading. No fevers, chills, nausea, or vomiting noted at this time. 05/25/19 on evaluation today patient appears to be doing about the same in regard to her sacral wound maybe slightly better compared to previous. Fortunately there's no signs of active infection at this time which is good news in regard to her lower extremities. She has previously had cellulitis therefore were trying to keep an eye on this as far as that's concerned. Nonetheless since I last saw her which was actually roughly 2 weeks ago since due to work her daughter was unable to bring her last week she is not had any care through home health and has not gotten compression stockings for her bilateral lower extremities. Subsequently the issue with home health seems  to be multifactorial when they contacted the patient and her daughter in order to get things scheduled they were told according to the daughter that she didn't know why they needed to come out three times a week and wasn't aware that that was what the order was gonna be. The daughter tells me that that somewhat poor timing for her as she works and doesn't have a lot of time to hang around at the house waiting for home health. With that being said I explained that the only thing we can really do is either have home health come out and perform the dressing changes or else potentially have a family member do this. There apparently is no family member including herself who is willing to do this she tells me "that she doesn't feel qualified and comfortable". Nonetheless either way the patient has not had a dressing on the sacral area since the last time she was here she uses a pad hasn't really noted much drainage onto the pad although it does appear that the wound itself trapped a lot of the fluid within  skin folds. In the end I do think that since she doesn't really have anyone to perform the dressing changes at home we are going to need to utilize the services of home health. They are in agreement with that plan. 06/01/19 on evaluation today patient presents for follow-up concerning her wound in the midline sacral area. This seems to be doing well there is some evidence of new skin growth there still an open sore however it is gonna require dressing. Home health is coming out to see her I think we do need to see about getting her a better schedule where potentially she comes entire clinic on Monday and then on Wednesday and Friday his home health change in her dressing as it stands right now I feel like this is happening to a regular for her. Fortunately there's no signs of any infection at this time her legs are doing well although she does have the compression stockings finally she doesn't actually have those on as of today. They want [place] to show them how to put them on. 06/08/19 on evaluation today patient came in today without any dressing on her sacral region. Apparently this fell off around Friday and unfortunately it was supposed to be reapplied Friday but the patient had an appointment scheduled for 11 o'clock and her home health nurse was supposed to arrive at 62. She was running late and therefore they called and canceled her ADILA, GONZAGA. ([id number]) coming out to perform the dressing change and subsequently even though they had dressing material at home her daughter refuses to apply the dressing to the wound. For that reason she went all weekend without any dressing until today. She also has gotten the compression stockings but has just use the Tubigrip on occasion although she doesn't even have that on today. Apparently her daughter stating that the compression stockings are too hard to get on and therefore they're not able to do that. Nonetheless as I stated patient came in with  nothing on her lower extremities as far as compression is concerned. They do want to see about extending her visits to every two weeks. 07/02/19 on evaluation today patient presents after apparently having been quarantined for 14 days due to having been exposed to someone with Covid-19 Virus during the time that she is been quarantined she has not actually had her dressing changed it all apparently nobody would  come around her including her daughter. Nonetheless her daughter hasn't been changing the dressing even prior they did have a home health nurse coming out but it Gillermina Phy may have been about a week's worth of time that the patient actually had home health change in the dressing and even then there was some issues with timing and them actually coming out an appropriate time that the patient and her daughter felt was within reason. Nonetheless they feel like they had a much better experience with [facility] in the past. 07/09/2019 on evaluation today patient appears to be doing unfortunately somewhat the same in regard to her wound. She really has not had a dressing on since we put one on last week. Then her daughter really is not able to perform the dressing changes the patient tells me that she has a hard time with the location of the wound had been able to perform the dressing change at this site. Nonetheless they also had issues with home health they wanted if we would initiate home health therapy to have a different company come out. 10/21/2019 on evaluation today patient presents for follow-up concerning the same issue on her midline sacral region. This is the same area that I was taken care of back last in September and that is the last time I saw her. We have never actually healed this out and at that point I did actually recommend that we were using dressings unfortunately the dressings just never worked out for her. She could not keep these in place. For that reason I did actually  suggest that we could try some creams but again then she never came back for follow-up but I am not really sure exactly what they have been doing in the interim. She has not really noted any significant bleeding and drainage has been she tells me fairly minimal both of which are good signs. With that being said unfortunately she still has an open wound which I explained is still a risk factor for infection. She also has an area on her second toe of the right foot right at the bend of the toe where this is somewhat hammered and subsequently rubs on her shoe. I think this too is a pressure/friction injury and subsequently it seems to be doing much better she has been on antibiotics both oral as well as topical mupirocin and taken by mouth she is unsure of what the antibiotic was. However she is also been using some antifungal cream around the toes in general. Overall the toe does seem to be doing much better with no obvious signs of infection at this point 11/11/2019 on evaluation today patient appears to be doing well overall with regard to her wounds. She has been tolerating the dressing changes without complication. We did recommend however that she use Betadine for her toe area they have been using an antibiotic ointment instead that they had. I explained the rationale for why we use the Betadine in order to try to keep this clean and dry preventing infection and gangrene. Nonetheless I think that is still probably the best bet for the toe area. It does not appear to be infected right now and really has not softened up enough to be debrided away in my opinion. With regard to her sacral region this is still something that seems to be doing okay in my opinion I think that she is tolerating the zinc applications and while I would prefer to have a dressing on this area  we really just have not been able to keep anything in the spot in order to allow it to heal appropriately. For that reason my suggestion  is good to be that we go ahead and continue with the zinc I do see some evidence of new epithelization around the edges of the wound. I do believe that she still needs to focus on trying to offload and her home aide nurse has mentioned the possibility of getting a hospital bed I think a hospital bed with an air mattress would be of benefit for her. Objective Constitutional Well-nourished and well-hydrated in no acute distress. Vitals Time Taken: 10:41 AM, Height: 60 in, Weight: 118 lbs, BMI: 23, Temperature: 97.8 F, Pulse: 81 bpm, Respiratory Younts, Kasidi L. ([id number]) Rate: 18 breaths/min, Blood Pressure: 123/64 mmHg. Respiratory normal breathing without difficulty. Psychiatric this patient is able to make decisions and demonstrates good insight into disease process. Alert and Oriented x 3. pleasant and cooperative. General Notes: Based on my evaluation today it appears that the patient is showing some signs of new epithelization around the edges of the wound in the sacral region. Obviously this still has a way to go to heal but nonetheless I feel like is doing okay. With regard to her toe ulcer this is still very stable and fairly hard I do not think that debridement would be the best option I think the best thing would be to leave this is stable eschar in place utilizing Betadine to try to keep this as clean and dry as possible. Eventually will start to flake off on its own. Integumentary (Hair, Skin) Wound #2 status is Open. Original cause of wound was Pressure Injury. The wound is located on the Midline Sacrum. The wound measures 2.4cm length x 1cm width x 1.1cm depth; 1.885cm^2 area and 2.073cm^3 volume. There is Fat Layer (Subcutaneous Tissue) Exposed exposed. There is no tunneling or undermining noted. There is a medium amount of serous drainage noted. The wound margin is flat and intact. There is large (67-100%) pink granulation within the wound bed. There is a small (1-33%)  amount of necrotic tissue within the wound bed including Adherent Slough. Wound #3 status is Open. Original cause of wound was Pressure Injury. The wound is located on the Right Toe Second. The wound measures 0.8cm length x 0.7cm width x 0.1cm depth; 0.44cm^2 area and 0.044cm^3 volume. The wound is limited to skin breakdown. There is no tunneling or undermining noted. There is a none present amount of drainage noted. There is no granulation within the wound bed. There is a large (67-100%) amount of necrotic tissue within the wound bed including Eschar. Assessment Active Problems ICD-10 Pressure ulcer of sacral region, stage 3 Pressure ulcer of other site, unstageable Lymphedema, not elsewhere classified Venous insufficiency (chronic) (peripheral) Essential (primary) hypertension Chronic obstructive pulmonary disease, unspecified Plan Wound Cleansing: Wound #2 Midline Sacrum: Clean wound with Normal Saline. - in office Dial antibacterial soap, wash wounds, rinse and pat dry prior to dressing wounds Wound #3 Right Toe Second: Clean wound with Normal Saline. - in office List, [name] ([id number]) Dial antibacterial soap, wash wounds, rinse and pat dry prior to dressing wounds Skin Barriers/Peri-Wound Care: Wound #2 Midline Sacrum: Barrier cream - to sacrum Wound #3 Right Toe Second: Barrier cream - to sacrum Other: - betadine to toe Secondary Dressing: Wound #3 Right Toe Second: Gauze and Kerlix/Conform - gauze and conform wrap Dressing Change Frequency: Wound #2 Midline Sacrum: Change dressing every day. - right second  toe Other: - Midline sacrum, 3 times daily Wound #3 Right Toe Second: Change dressing every day. - right second toe Other: - Midline sacrum, 3 times daily Change dressing every day. - right second toe Other: - Midline sacrum, 3 times daily Follow-up Appointments: Wound #2 Midline Sacrum: Return Appointment in 2 weeks. Wound #3 Right Toe Second: Return  Appointment in 2 weeks. 1. We can go ahead and continue with the Betadine for the toe this is something that I explained the rationale for and the patient as well as her daughter are willing to go forward with this. 2. I would recommend as well that we also continue with the zinc oxide for the sacral region. Obviously I think a dressing in particular something such as alginate would be ideal but again the patient is not able to keep any dressings in place in this location. We have been down that road before. 3. I do believe as well that the patient needs to strongly consider getting the hospital bed that was recommended. Obviously if she can get an air mattress that may help with some of the pressure offloading for the sacral area obviously is not a replacement for appropriate offloading but at least can be a little bit of any. We will see patient back for reevaluation in 2 weeks here in the clinic. If anything worsens or changes patient will contact our office for additional recommendations. Electronic Signature(s) Signed: 11/11/2019 2:10:32 PM By: [name] PA-C Previous Signature: 11/11/2019 2:08:39 PM Version By: [name] PA-C Entered By: [name] on 11/11/2019 14:10:32 Joyce [name], Joyce [name] ([id number]) -------------------------------------------------------------------------------- SuperBill Details Patient Name: Burkemper, Quynh L. Date of Service: 11/11/2019 Medical Record Number: [id number] Patient Account Number: [id number] Date of Birth/Sex: 04/12/1928 (86 y.o. F) Treating RN: Army Melia Primary Care Provider: [name], [name] Other Clinician: Referring Provider: [name] Treating Provider/Extender: Melburn Hake, HOYT Weeks in Treatment: 3 Diagnosis Coding ICD-10 Codes Code Description L89.153 Pressure ulcer of sacral region, stage 3 L89.890 Pressure ulcer of other site, unstageable I89.0 Lymphedema, not elsewhere classified I87.2 Venous insufficiency  (chronic) (peripheral) I10 Essential (primary) hypertension J44.9 Chronic obstructive pulmonary disease, unspecified Facility Procedures CPT4 Code: [id number] Description: 99213 - WOUND CARE VISIT-LEV 3 EST PT Modifier: Quantity: 1 Physician Procedures CPT4 Code: [id number] Description: [id number] - WC PHYS LEVEL 3 - EST PT ICD-10 Diagnosis Description L89.153 Pressure ulcer of sacral region, stage 3 L89.890 Pressure ulcer of other site, unstageable I89.0 Lymphedema, not elsewhere classified I87.2 Venous insufficiency (chronic)  (peripheral) Modifier: Quantity: 1 Electronic Signature(s) Signed: 11/11/2019 2:10:45 PM By: [name] PA-C Entered By: [name] on 11/11/2019 14:10:44

## 2019-11-11 NOTE — Progress Notes (Addendum)
JUANIKA, ZAHM ([id number]) Visit Report for 11/11/2019 Arrival Information Details Patient Name: SAMAND, [name] Date of Service: 11/11/2019 10:30 AM Medical Record Number: [id number] Patient Account Number: [id number] Date of Birth/Sex: 1928-02-21 (86 y.o. F) Treating RN: Montey Hora Primary Care Fiona Coto: [name], [name] Other Clinician: Referring Geramy Lamorte: [name] Treating Dezirea Mccollister/Extender: Melburn Hake, HOYT Weeks in Treatment: 3 Visit Information History Since Last Visit Added or deleted any medications: No Patient Arrived: Walker Any new allergies or adverse reactions: No Arrival Time: 10:40 Had a fall or experienced change in No Accompanied By: daughter activities of daily living that may affect Transfer Assistance: None risk of falls: Patient Identification Verified: Yes Signs or symptoms of abuse/neglect since last visito No Secondary Verification Process Completed: Yes Hospitalized since last visit: No Implantable device outside of the clinic excluding No cellular tissue based products placed in the center since last visit: Has Dressing in Place as Prescribed: No Pain Present Now: No Electronic Signature(s) Signed: 11/11/2019 4:40:20 PM By: Montey Hora Entered By: Montey Hora on 11/11/2019 10:40:57 Hopke, Jonae L. ([id number]) -------------------------------------------------------------------------------- Clinic Level of Care Assessment Details Patient Name: Velazquez, Abree L. Date of Service: 11/11/2019 10:30 AM Medical Record Number: [id number] Patient Account Number: [id number] Date of Birth/Sex: 1928-02-06 (86 y.o. F) Treating RN: Army Melia Primary Care Kelicia Youtz: [name], [name] Other Clinician: Referring Venda Dice: [name] Treating Semaj Kham/Extender: Melburn Hake, HOYT Weeks in Treatment: 3 Clinic Level of Care Assessment Items TOOL 4 Quantity Score []  - Use when only an EandM is performed on FOLLOW-UP visit 0 ASSESSMENTS -  Nursing Assessment / Reassessment X - Reassessment of Co-morbidities (includes updates in patient status) 1 10 X- 1 5 Reassessment of Adherence to Treatment Plan ASSESSMENTS - Wound and Skin Assessment / Reassessment []  - Simple Wound Assessment / Reassessment - one wound 0 X- 2 5 Complex Wound Assessment / Reassessment - multiple wounds []  - 0 Dermatologic / Skin Assessment (not related to wound area) ASSESSMENTS - Focused Assessment []  - Circumferential Edema Measurements - multi extremities 0 []  - 0 Nutritional Assessment / Counseling / Intervention []  - 0 Lower Extremity Assessment (monofilament, tuning fork, pulses) []  - 0 Peripheral Arterial Disease Assessment (using hand held doppler) ASSESSMENTS - Ostomy and/or Continence Assessment and Care []  - Incontinence Assessment and Management 0 []  - 0 Ostomy Care Assessment and Management (repouching, etc.) PROCESS - Coordination of Care X - Simple Patient / Family Education for ongoing care 1 15 []  - 0 Complex (extensive) Patient / Family Education for ongoing care []  - 0 Staff obtains Programmer, systems, Records, Test Results / Process Orders []  - 0 Staff telephones HHA, Nursing Homes / Clarify orders / etc []  - 0 Routine Transfer to another Facility (non-emergent condition) []  - 0 Routine Hospital Admission (non-emergent condition) []  - 0 New Admissions / Biomedical engineer / Ordering NPWT, Apligraf, etc. []  - 0 Emergency Hospital Admission (emergent condition) X- 1 10 Simple Discharge Coordination Devincenzi, Mabell L. ([id number]) []  - 0 Complex (extensive) Discharge Coordination PROCESS - Special Needs []  - Pediatric / Minor Patient Management 0 []  - 0 Isolation Patient Management []  - 0 Hearing / Language / Visual special needs []  - 0 Assessment of Community assistance (transportation, D/C planning, etc.) []  - 0 Additional assistance / Altered mentation []  - 0 Support Surface(s) Assessment (bed, cushion, seat,  etc.) INTERVENTIONS - Wound Cleansing / Measurement []  - Simple Wound Cleansing - one wound 0 X- 2 5 Complex Wound Cleansing - multiple wounds X- 1 5  Wound Imaging (photographs - any number of wounds) []  - 0 Wound Tracing (instead of photographs) []  - 0 Simple Wound Measurement - one wound X- 2 5 Complex Wound Measurement - multiple wounds INTERVENTIONS - Wound Dressings X - Small Wound Dressing one or multiple wounds 2 10 []  - 0 Medium Wound Dressing one or multiple wounds []  - 0 Large Wound Dressing one or multiple wounds []  - 0 Application of Medications - topical []  - 0 Application of Medications - injection INTERVENTIONS - Miscellaneous []  - External ear exam 0 []  - 0 Specimen Collection (cultures, biopsies, blood, body fluids, etc.) []  - 0 Specimen(s) / Culture(s) sent or taken to Lab for analysis []  - 0 Patient Transfer (multiple staff / Civil Service fast streamer / Similar devices) []  - 0 Simple Staple / Suture removal (25 or less) []  - 0 Complex Staple / Suture removal (26 or more) []  - 0 Hypo / Hyperglycemic Management (close monitor of Blood Glucose) []  - 0 Ankle / Brachial Index (ABI) - do not check if billed separately X- 1 5 Vital Signs Pavia, Faviola L. ([id number]) Has the patient been seen at the hospital within the last three years: Yes Total Score: 100 Level Of Care: New/Established - Level 3 Electronic Signature(s) Signed: 11/11/2019 11:26:15 AM By: Army Melia Entered By: Army Melia on 11/11/2019 11:10:30 Sofia, Biance L. ([id number]) -------------------------------------------------------------------------------- Complex / Palliative Patient Assessment Details Patient Name: Toure, Lile L. Date of Service: 11/11/2019 10:30 AM Medical Record Number: [id number] Patient Account Number: [id number] Date of Birth/Sex: February 01, 1928 (86 y.o. F) Treating RN: Cornell Barman Primary Care Keelyn Fjelstad: [name], [name] Other Clinician: Referring Tameia Rafferty: [name],  [name] Treating Jashayla Glatfelter/Extender: [name] Weeks in Treatment: 3 Palliative Management Criteria Patient has a living will that specifies no extraordinary measures and advanced wound treatment would expose the patient to those extraordinary interventions. Patient has a terminal condition (life expectancy of less than 6 months) and advanced wound care would negatively impact the patient's quality of life. Complex Wound Management Criteria Care Approach Wound Care Plan: Palliative Wound Management Electronic Signature(s) Signed: 12/14/2019 11:41:29 AM By: Gretta Cool, BSN, RN, CWS, Kim RN, BSN Signed: 12/21/2019 3:55:29 PM By: [name] PA-C Entered By: Gretta Cool, BSN, RN, CWS, Kim on 12/14/2019 11:41:28 Behnken, Irven [place] ([id number]) -------------------------------------------------------------------------------- Encounter Discharge Information Details Patient Name: Brasil, Fawne L. Date of Service: 11/11/2019 10:30 AM Medical Record Number: [id number] Patient Account Number: [id number] Date of Birth/Sex: 01/17/1928 (86 y.o. F) Treating RN: Army Melia Primary Care Porshea Janowski: [name] Other Clinician: Referring Kazuma Elena: [name] Treating Adan Baehr/Extender: Sharalyn Ink in Treatment: 3 Encounter Discharge Information Items Discharge Condition: Stable Ambulatory Status: Walker Discharge Destination: Home Transportation: Private Auto Accompanied By: daughter Schedule Follow-up Appointment: Yes Clinical Summary of Care: Electronic Signature(s) Signed: 11/11/2019 11:26:15 AM By: Army Melia Entered By: Army Melia on 11/11/2019 11:11:33 [place], [place] ([id number]) -------------------------------------------------------------------------------- Lower Extremity Assessment Details Patient Name: Counterman, Saraya L. Date of Service: 11/11/2019 10:30 AM Medical Record Number: [id number] Patient Account Number: [id number] Date of Birth/Sex: 27-Apr-1928 (86 y.o.  F) Treating RN: Montey Hora Primary Care Raneisha Bress: [name], [name] Other Clinician: Referring Janya Eveland: [name], RICHARD Treating Amry [name]/Extender: Melburn Hake, HOYT Weeks in Treatment: 3 Edema Assessment Assessed: [Left: No] [Right: No] Edema: [Left: Ye] [Right: s] Vascular Assessment Pulses: Dorsalis Pedis Palpable: [Right:Yes] Electronic Signature(s) Signed: 11/11/2019 4:40:20 PM By: Montey Hora Entered By: Montey Hora on 11/11/2019 10:51:49 Zwick, Klarisa L. ([id number]) -------------------------------------------------------------------------------- Multi Wound Chart Details Patient Name: Sabra Heck,  Michole L. Date of Service: 11/11/2019 10:30 AM Medical Record Number: [id number] Patient Account Number: [id number] Date of Birth/Sex: Oct 15, 1928 (86 y.o. F) Treating RN: Army Melia Primary Care Camren Lipsett: [name], [name] Other Clinician: Referring Shanica Castellanos: [name] Treating Kacen Mellinger/Extender: Melburn Hake, HOYT Weeks in Treatment: 3 Vital Signs Height(in): 60 Pulse(bpm): 81 Weight(lbs): 118 Blood Pressure(mmHg): 123/64 Body Mass Index(BMI): 23 Temperature(F): 97.8 Respiratory Rate 18 (breaths/min): Photos: [N/A:N/A] Wound Location: Sacrum - Midline Right Toe Second N/A Wounding Event: Pressure Injury Pressure Injury N/A Primary Etiology: Pressure Ulcer Pressure Ulcer N/A Comorbid History: Chronic Obstructive Chronic Obstructive N/A Pulmonary Disease (COPD), Pulmonary Disease (COPD), History of pressure wounds, History of pressure wounds, Osteoarthritis Osteoarthritis Date Acquired: 12/19/2018 09/19/2019 N/A Weeks of Treatment: 3 3 N/A Wound Status: Open Open N/A Measurements L x W x D 2.4x1x1.1 0.8x0.7x0.1 N/A (cm) Area (cm) : 1.885 0.44 N/A Volume (cm) : 2.073 0.044 N/A % Reduction in Area: 36.00% -14.30% N/A % Reduction in Volume: -134.50% -15.80% N/A Classification: Category/Stage III Unstageable/Unclassified N/A Exudate Amount: Medium  None Present N/A Exudate Type: Serous N/A N/A Exudate Color: amber N/A N/A Wound Margin: Flat and Intact N/A N/A Granulation Amount: Large (67-100%) None Present (0%) N/A Granulation Quality: Pink N/A N/A Necrotic Amount: Small (1-33%) Large (67-100%) N/A Necrotic Tissue: Adherent Slough Eschar N/A Exposed Structures: Fat Layer (Subcutaneous Fascia: No N/A Tissue) Exposed: Yes Fat Layer (Subcutaneous Fascia: No Tissue) Exposed: No Tendon: No Tendon: No Byas, Kendel L. ([id number]) Muscle: No Muscle: No Joint: No Joint: No Bone: No Bone: No Limited to Skin Breakdown Epithelialization: Small (1-33%) None N/A Treatment Notes Electronic Signature(s) Signed: 11/11/2019 11:26:15 AM By: Army Melia Entered By: Army Melia on 11/11/2019 11:03:14 Granberg, Jayliah L. ([id number]) -------------------------------------------------------------------------------- [place] Details Patient Name: Hemstreet, Mozel L. Date of Service: 11/11/2019 10:30 AM Medical Record Number: [id number] Patient Account Number: [id number] Date of Birth/Sex: 04/23/1928 (86 y.o. F) Treating RN: Army Melia Primary Care Billee Balcerzak: [name] Other Clinician: Referring Alano Blasco: [name], [name] Treating Linnie Delgrande/Extender: Sharalyn Ink in Treatment: 3 Active Inactive Electronic Signature(s) Signed: 12/14/2019 11:46:26 AM By: Gretta Cool, BSN, RN, CWS, Kim RN, BSN Signed: 12/24/2019 12:35:23 PM By: Army Melia Previous Signature: 11/11/2019 11:26:15 AM Version By: Army Melia Entered By: Gretta Cool BSN, RN, CWS, Kim on 12/14/2019 11:46:25 [place], [place] ([id number]) -------------------------------------------------------------------------------- Pain Assessment Details Patient Name: Enloe, Amanat L. Date of Service: 11/11/2019 10:30 AM Medical Record Number: [id number] Patient Account Number: [id number] Date of Birth/Sex: 04-04-1928 (86 y.o. F) Treating RN: Montey Hora Primary Care  Yazmyn Valbuena: [name], [name] Other Clinician: Referring Trevontae Lindahl: [name] Treating Sherald Balbuena/Extender: Melburn Hake, HOYT Weeks in Treatment: 3 Active Problems Location of Pain Severity and Description of Pain Patient Has Paino No Site Locations Pain Management and Medication Current Pain Management: Electronic Signature(s) Signed: 11/11/2019 4:40:20 PM By: Montey Hora Entered By: Montey Hora on 11/11/2019 10:41:04 Slocumb, Aleesha L. ([id number]) -------------------------------------------------------------------------------- Patient/Caregiver Education Details Patient Name: Blumenstock, Sameerah L. Date of Service: 11/11/2019 10:30 AM Medical Record Number: [id number] Patient Account Number: [id number] Date of Birth/Gender: 26-May-1928 (86 y.o. F) Treating RN: Army Melia Primary Care Physician: [name] Other Clinician: Referring Physician: [name] Treating Physician/Extender: Sharalyn Ink in Treatment: 3 Education Assessment Education Provided To: Patient Education Topics Provided Wound/Skin Impairment: Handouts: Caring for Your Ulcer Methods: Demonstration, Explain/Verbal Responses: State content correctly Electronic Signature(s) Signed: 11/11/2019 11:26:15 AM By: Army Melia Entered By: Army Melia on 11/11/2019 11:10:43 Pennebaker, Tamara L. ([id number]) -------------------------------------------------------------------------------- Wound Assessment  Details Patient Name: Jeanbaptiste, Adamariz L. Date of Service: 11/11/2019 10:30 AM Medical Record Number: [id number] Patient Account Number: [id number] Date of Birth/Sex: 17-Jun-1928 (86 y.o. F) Treating RN: Montey Hora Primary Care Jayle Solarz: [name], [name] Other Clinician: Referring Hanzel Pizzo: [name] Treating Aaronjames Kelsay/Extender: Melburn Hake, HOYT Weeks in Treatment: 3 Wound Status Wound Number: 2 Primary Pressure Ulcer Etiology: Wound Location: Sacrum - Midline Wound Open Wounding  Event: Pressure Injury Status: Date Acquired: 12/19/2018 Comorbid Chronic Obstructive Pulmonary Disease Weeks Of Treatment: 3 History: (COPD), History of pressure wounds, Clustered Wound: No Osteoarthritis Photos Wound Measurements Length: (cm) 2.4 % Reduction i Width: (cm) 1 % Reduction i Depth: (cm) 1.1 Epithelializa Area: (cm) 1.885 Tunneling: Volume: (cm) 2.073 Undermining: n Area: 36% n Volume: -134.5% tion: Small (1-33%) No No Wound Description Classification: Category/Stage III Foul Odor Aft Wound Margin: Flat and Intact Slough/Fibrin Exudate Amount: Medium Exudate Type: Serous Exudate Color: amber er Cleansing: No o Yes Wound Bed Granulation Amount: Large (67-100%) Exposed Structure Granulation Quality: Pink Fascia Exposed: No Necrotic Amount: Small (1-33%) Fat Layer (Subcutaneous Tissue) Exposed: Yes Necrotic Quality: Adherent Slough Tendon Exposed: No Muscle Exposed: No Joint Exposed: No Bone Exposed: No Electronic Signature(s) Smucker, Yuri L. ([id number]) Signed: 11/11/2019 4:40:20 PM By: Montey Hora Entered By: Montey Hora on 11/11/2019 10:50:38 Cardin, Sena L. ([id number]) -------------------------------------------------------------------------------- Wound Assessment Details Patient Name: Flagler, Faren L. Date of Service: 11/11/2019 10:30 AM Medical Record Number: [id number] Patient Account Number: [id number] Date of Birth/Sex: November 04, 1928 (86 y.o. F) Treating RN: Montey Hora Primary Care Hansini Clodfelter: [name], [name] Other Clinician: Referring Cayleen Benjamin: [name] Treating Frederika Hukill/Extender: Melburn Hake, HOYT Weeks in Treatment: 3 Wound Status Wound Number: 3 Primary Pressure Ulcer Etiology: Wound Location: Right Toe Second Wound Open Wounding Event: Pressure Injury Status: Date Acquired: 09/19/2019 Comorbid Chronic Obstructive Pulmonary Disease Weeks Of Treatment: 3 History: (COPD), History of pressure wounds, Clustered  Wound: No Osteoarthritis Photos Wound Measurements Length: (cm) 0.8 % Reduction in Width: (cm) 0.7 % Reduction in Depth: (cm) 0.1 Epithelializat Area: (cm) 0.44 Tunneling: Volume: (cm) 0.044 Undermining: Area: -14.3% Volume: -15.8% ion: None No No Wound Description Classification: Unstageable/Unclassified Foul Odor Aft Exudate Amount: None Present Slough/Fibrin er Cleansing: No o No Wound Bed Granulation Amount: None Present (0%) Exposed Structure Necrotic Amount: Large (67-100%) Fascia Exposed: No Necrotic Quality: Eschar Fat Layer (Subcutaneous Tissue) Exposed: No Tendon Exposed: No Muscle Exposed: No Joint Exposed: No Bone Exposed: No Limited to Skin Breakdown Electronic Signature(s) Signed: 11/11/2019 4:40:20 PM By: Thalia Party, Irven [place] ([id number]) Entered By: Montey Hora on 11/11/2019 [place], [place] ([id number]) -------------------------------------------------------------------------------- Vitals Details Patient Name: Beckers, Bradi L. Date of Service: 11/11/2019 10:30 AM Medical Record Number: [id number] Patient Account Number: [id number] Date of Birth/Sex: 08-Mar-1928 (86 y.o. F) Treating RN: Montey Hora Primary Care Jnyah Brazee: [name], [name] Other Clinician: Referring Victoria Euceda: [name] Treating Vikkie Goeden/Extender: Melburn Hake, HOYT Weeks in Treatment: 3 Vital Signs Time Taken: 10:41 Temperature (F): 97.8 Height (in): 60 Pulse (bpm): 81 Weight (lbs): 118 Respiratory Rate (breaths/min): 18 Body Mass Index (BMI): 23 Blood Pressure (mmHg): 123/64 Reference Range: 80 - 120 mg / dl Electronic Signature(s) Signed: 11/11/2019 4:40:20 PM By: Montey Hora Entered By: Montey Hora on 11/11/2019 10:43:45

## 2019-11-25 ENCOUNTER — Ambulatory Visit: Payer: Medicare Other | Admitting: Physician Assistant

## 2019-12-03 ENCOUNTER — Telehealth: Payer: Self-pay

## 2019-12-03 NOTE — Telephone Encounter (Signed)
Telephone call to patients [name] to schedule palliative care visit.  RN left message requesting call back to schedule palliative care home visit.

## 2019-12-06 ENCOUNTER — Telehealth: Payer: Self-pay

## 2019-12-06 NOTE — Telephone Encounter (Signed)
Telephone call to daughter after RN missed cal from daughter.  [name] in agreement with RN making home visit 12/13/19 at 1:00 PM.

## 2019-12-06 NOTE — Telephone Encounter (Signed)
SW received request from RN stating that patient's daughter was interested in resource information for local caregiving agencies. SW contacted [name] (patient's daughter) to confirm request. [name] said she is needing more options for respite care in the home. [name] has local friends that she utilizes for care support but feels patient may need more increased care options when [name] is not available. [name] requested that SW email list of caregiving agencies in [place]. No additional questions or concerns at this time.

## 2019-12-13 ENCOUNTER — Other Ambulatory Visit: Payer: Self-pay

## 2019-12-13 ENCOUNTER — Other Ambulatory Visit: Payer: Medicare Other

## 2019-12-13 DIAGNOSIS — Z515 Encounter for palliative care: Secondary | ICD-10-CM

## 2019-12-13 NOTE — Progress Notes (Signed)
PATIENT NAME: Joyce [name] DOB: 05/20/1928 MRN: [id number]  PRIMARY CARE PROVIDER: Jerrol Banana., MD  RESPONSIBLE PARTY:  Acct ID - Guarantor Home Phone Work Phone Relationship Acct Type  [id number] ELIAS, [phone]  Self P/F     [address], [place], [place] [address]    PLAN OF CARE and INTERVENTIONS:               1.  GOALS OF CARE/ ADVANCE CARE PLANNING:  Remain in home with daughter and son in law.               2.  PATIENT/CAREGIVER EDUCATION:  Education on fall precautions, education on need to reposition to prevent skin breakdown, education on s/s of infection, reviewed meds.               3.  DISEASE STATUS: RN made scheduled palliative care home visit. Nurse met with patient and patients [name]. Patient sitting in her recliner watching TV. Patient denies pain other than some burning between sacral folds where patient has stage 2 skin breakdown. Patient also complains of weakness and numbness in her legs upon waking. Nurse talked with patient and daughter that's patient feet/legs  legs are down at night as patient sleeps in recliner and does not elevate lower extremities.  RN informed patient and daughter this is likely causing reduced circulation. After discussion and education patient in agreement with trying a hospital bed. Nurse feels this will help improve patients circulation, aide patient with breathing better and also reduce pressure on patients sacral area. Patient had 1 appointment at the wound center and saw PA [facility].  Daughter reports PA  was in agreement with hospital bed. Patient also has dried up lesion on first toe beside great toe on right foot. Wound is dry dry and raised. No current treatment to lesion on great toe. Patient also has two small lesions on right leg that daughter applied Neosporin and covered with Band-Aid. Area is healing well and has no dressing in place at the present time. Patient reports her appetite remains good but does  not eat a lot at one time. Daughter concerned about the amount of sweets patient eats. Daughter states patient still eats a variety of other foods. Education to daughter to give patient whatever she wants to eat. Patient continues to have a congested sounding cough. Patient and daughter report cough is productive at times and other times patient is unable to bring up sputum. Education to patient to  increase water intake to thin out sputum. Patient has scattered rhonchi throughout lungs.  Patient has appointment scheduled with PCP in May. Patient's vital signs are stable. Patient has nebulizer in home but has not been been doing neb treatments daily. Patient continues to feel anxious at times takes 1 tablet of Lorazepam every morning. Patient continues to ambulate with her rollator walker. Patient has not suffered any recent falls. Patient has had no changes in current medications. Patient and daughter remain in agreement with palliative care services. Patient and daughter encouraged to contact palliative care with questions or concerns.     HISTORY OF PRESENT ILLNESS:  Patient is a 86 year old female who resides in home with her [name] and [name]'s husband. Patient is followed by palliative care team and is seen monthly and PRN.   CODE STATUS: DNR  ADVANCED DIRECTIVES: Y MOST FORM: No PPS: 40%   PHYSICAL EXAM:   VITALS: Today's Vitals   12/13/19 1353  BP: (!) 102/48  Pulse: 90  Resp: 18  Temp: (!) 95.4 F (35.2 C)  TempSrc: Temporal  SpO2: 92%  PainSc: 2   PainLoc: Hand    LUNGS: scattered rhonchi bilaterally CARDIAC: Cor RRR  EXTREMITIES: 1+ edema SKIN: stage II decub between sacral folds, sinc oxide to area, nonstageable woun don 1st toe beside great toe on right foot, no treatment, continue to monitor, 2 small healing lesion son right lower leg currently open to air, no s/s of infection  NEURO: positive for gait problems, memory problems and weakness       [name], RN

## 2019-12-21 ENCOUNTER — Telehealth: Payer: Self-pay

## 2019-12-21 NOTE — Telephone Encounter (Signed)
Per Dr. Rosanna Randy, okay to order bed. However patient will need a face to face office visit or virtual visit in order for Medicare to pay for bed. Returned call to [employer]. Per Loree Fee patient's family is canceling the request for a hospital bed for now. Loree Fee stated she will notify the family that if they decide to order a bed, they will need an in person office visit or a virtual visit.

## 2019-12-21 NOTE — Telephone Encounter (Signed)
SW called PCP office to request order for hospital bed to assist with comfort and wound healing. Left message for PCP. SW called to update [name], left VM. [name] returned call and requested that the order be canceled because patient has changed her mind and does not want the bed at this time.  SW spoke with April, CMA with PCP office. April advised that patient would need an in-person visit or virtual visit prior to ordering a hospital bed, if patient changes her mind. SW notified [name] of this information.

## 2019-12-21 NOTE — Telephone Encounter (Signed)
Copied from [place] [id number]. Topic: General - Inquiry >> Dec 21, 2019 11:16 AM Joyce [name], NT wrote: Reason for CRM: Pt's palliative social worker called in stating she is wondering if PCP would be willing to do a prescription for a standard electronic hospital bed for pt, to help with wound on bottom. Please advise and call back is [phone]

## 2020-01-04 ENCOUNTER — Other Ambulatory Visit: Payer: Self-pay | Admitting: Family Medicine

## 2020-01-13 IMAGING — US VENOUS DOPPLER ULTRASOUND OF  LOWER EXTREMITIES
1 series · 13 of 24 positions shown · non-contrast
Comparison: None.

CLINICAL DATA: Bilateral lower extremity edema.



[Series 1: venous doppler ultrasound of lower extremities · 0.07mm/px · 13 of 60 slices shown]
[im 1/60]
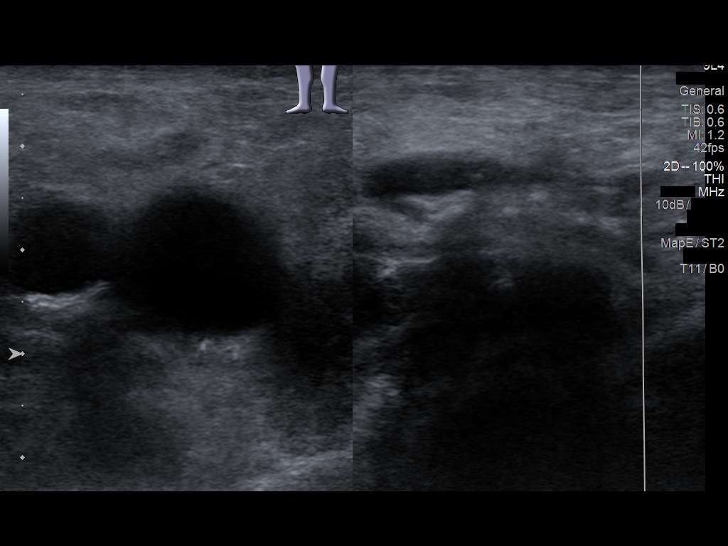
[im 6/60]
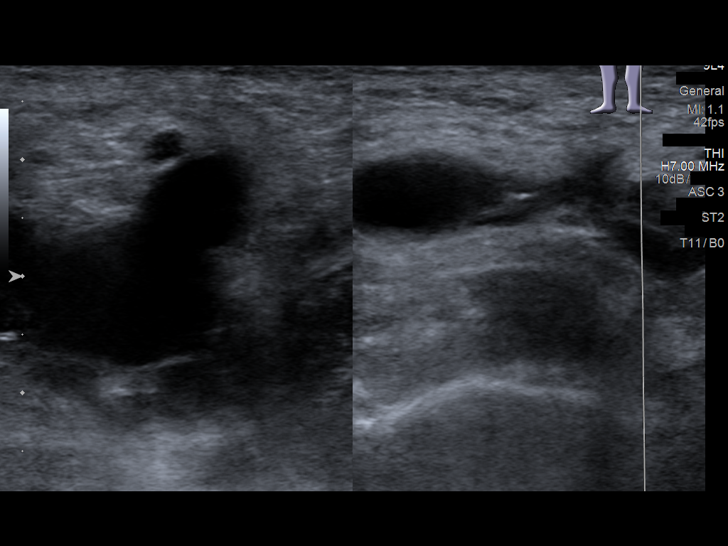
[im 11/60]
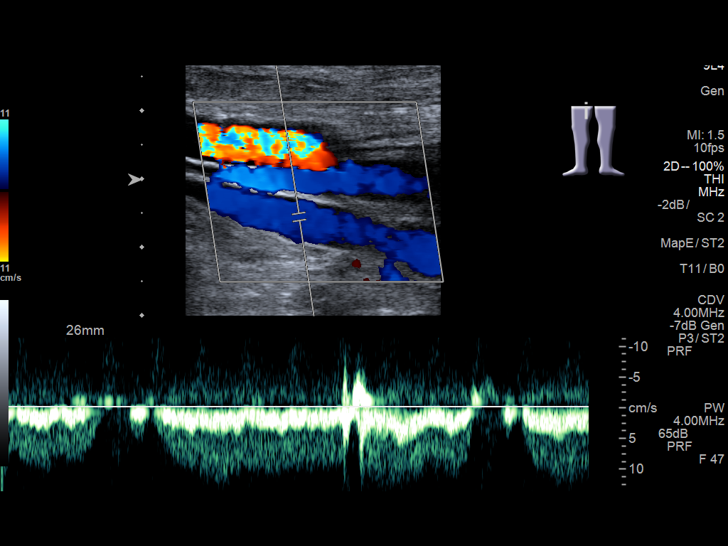
[im 16/60]
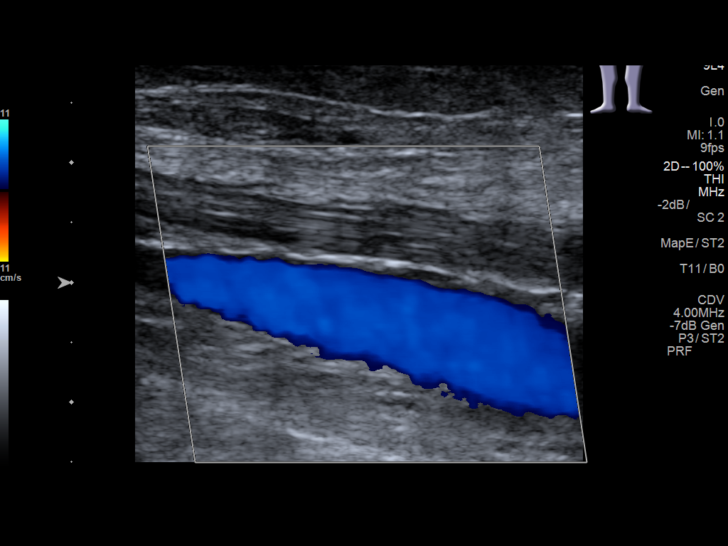
[im 21/60]
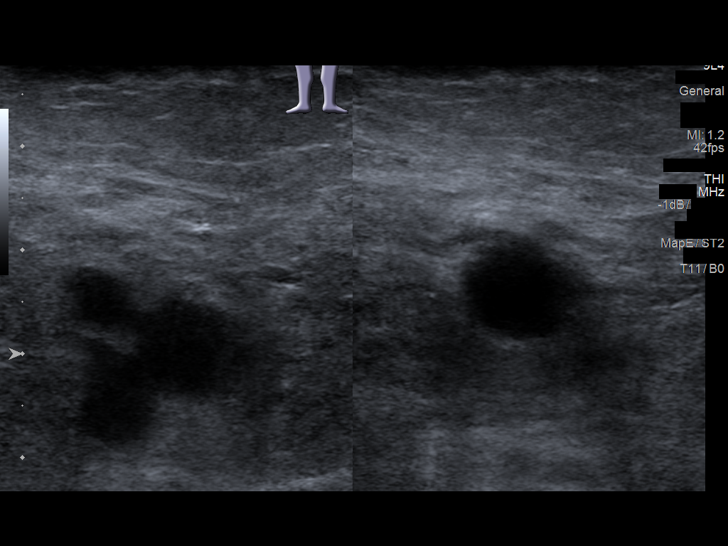
[im 26/60]
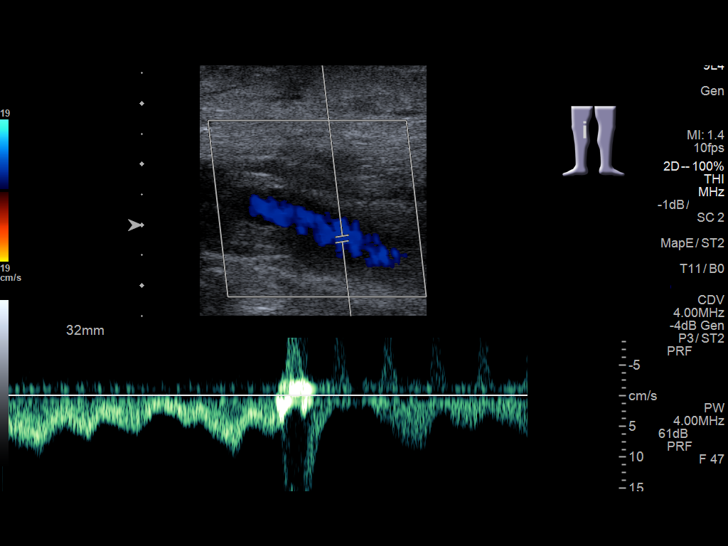
[im 31/60]
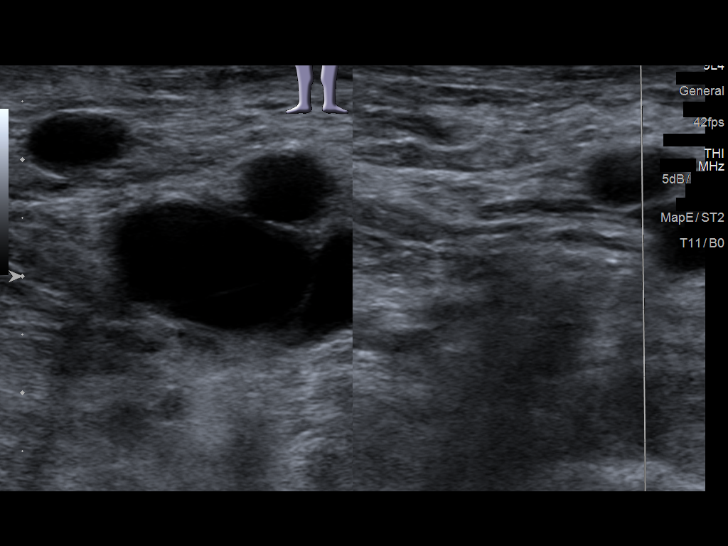
[im 34/60]
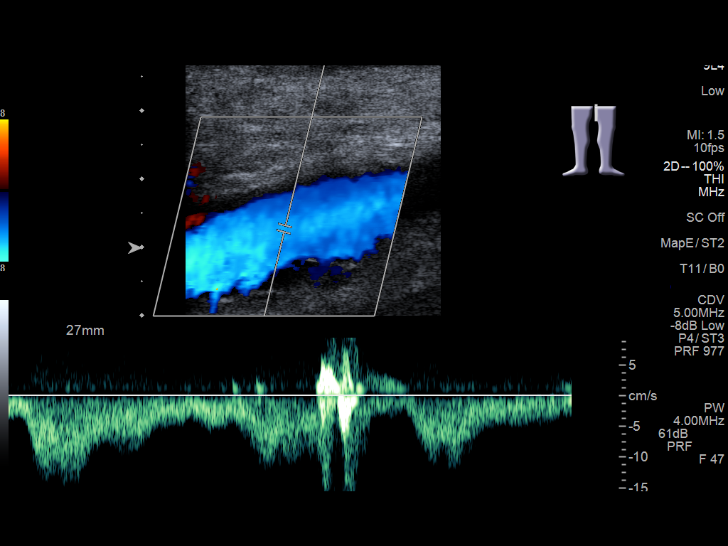
[im 39/60]
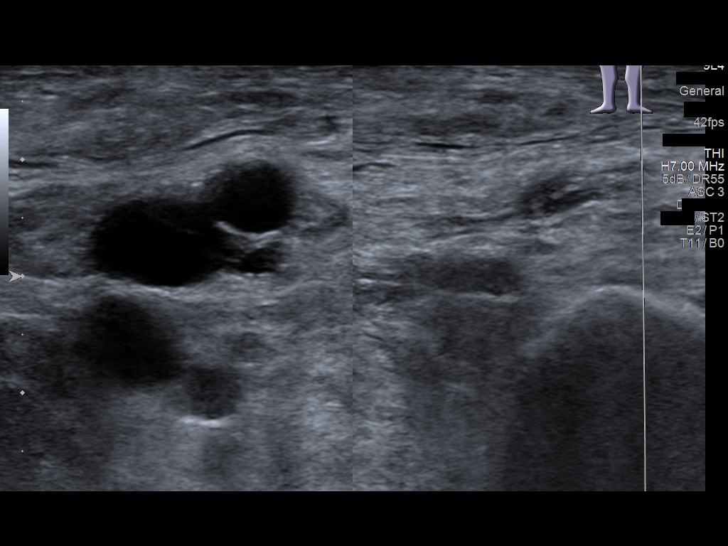
[im 44/60]
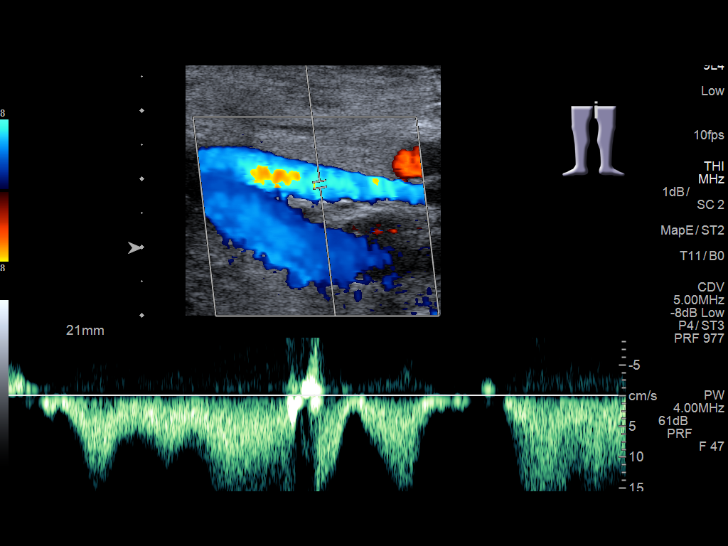
[im 49/60]
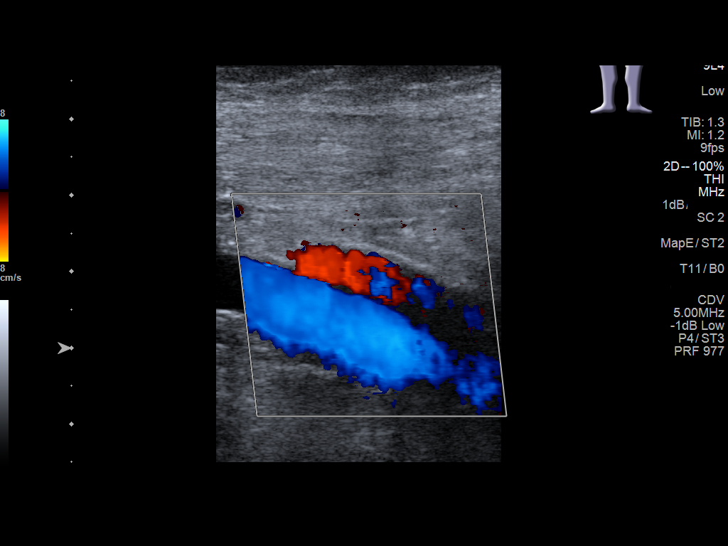
[im 54/60]
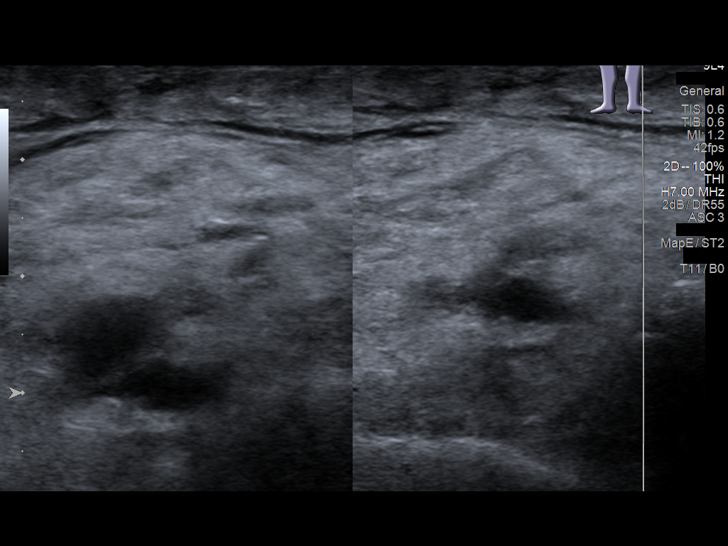
[im 60/60]
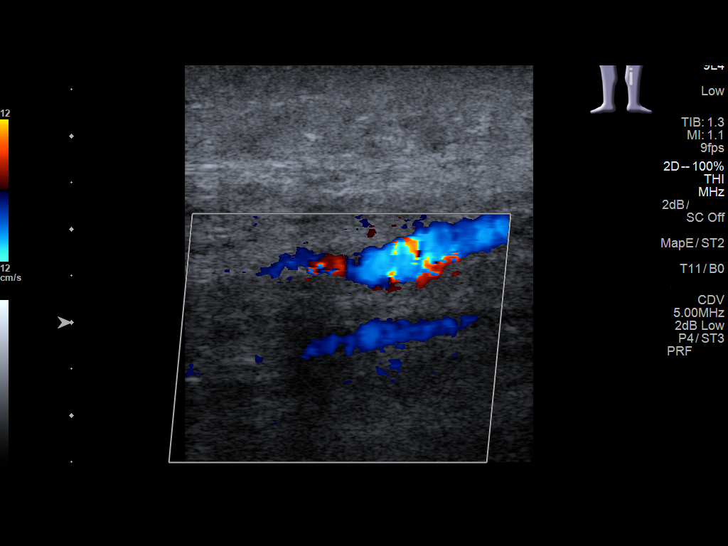

[13 of 24 positions shown; findings below may reference images not displayed]

FINDINGS: RIGHT LOWER EXTREMITY

Common Femoral Vein: No evidence of thrombus. Normal
compressibility, respiratory phasicity and response to augmentation.

Saphenofemoral Junction: No evidence of thrombus. Normal
compressibility and flow on color Doppler imaging.

Profunda Femoral Vein: No evidence of thrombus. Normal
compressibility and flow on color Doppler imaging.

Femoral Vein: No evidence of thrombus. Normal compressibility,
respiratory phasicity and response to augmentation.

Popliteal Vein: No evidence of thrombus. Normal compressibility,
respiratory phasicity and response to augmentation.

Calf Veins: No evidence of thrombus. Normal compressibility and flow
on color Doppler imaging.

Superficial Great Saphenous Vein: No evidence of thrombus. Normal
compressibility.

Venous Reflux:  None.

Other Findings: No evidence of superficial thrombophlebitis or
abnormal fluid collection.

LEFT LOWER EXTREMITY

Common Femoral Vein: No evidence of thrombus. Normal
compressibility, respiratory phasicity and response to augmentation.

Saphenofemoral Junction: No evidence of thrombus. Normal
compressibility and flow on color Doppler imaging.

Profunda Femoral Vein: No evidence of thrombus. Normal
compressibility and flow on color Doppler imaging.

Femoral Vein: No evidence of thrombus. Normal compressibility,
respiratory phasicity and response to augmentation.

Popliteal Vein: No evidence of thrombus. Normal compressibility,
respiratory phasicity and response to augmentation.

Calf Veins: No evidence of thrombus. Normal compressibility and flow
on color Doppler imaging.

Superficial Great Saphenous Vein: No evidence of thrombus. Normal
compressibility.

Venous Reflux:  None.

Other Findings: No evidence of superficial thrombophlebitis or
abnormal fluid collection.
IMPRESSION: No evidence of deep venous thrombosis in either lower extremity.

## 2020-01-20 ENCOUNTER — Telehealth: Payer: Self-pay

## 2020-01-20 NOTE — Telephone Encounter (Signed)
Telephone call to schedule palliative care visit.  Daughter didn't answer phone, RN left message requesting call back to schedule palliative care visit.

## 2020-01-21 ENCOUNTER — Telehealth: Payer: Self-pay

## 2020-01-21 NOTE — Telephone Encounter (Signed)
Telephone call to patients daughter [name]'s phone to ask if RN could make home visit today.  RM left message requesting call back to schedule visit.

## 2020-01-27 ENCOUNTER — Telehealth: Payer: Self-pay

## 2020-01-28 ENCOUNTER — Telehealth: Payer: Self-pay

## 2020-01-29 NOTE — Telephone Encounter (Signed)
3/25 - SW received VM from [place] (patient's daughter) requesting call back. 3/26 - SW attempted to return call, left VM with contact information.

## 2020-01-29 NOTE — Telephone Encounter (Signed)
3/25 - SW received VM from [place] (patient's daughter) requesting call back. 3/26 - SW attempted to return call, left VM with contact information.

## 2020-01-31 ENCOUNTER — Other Ambulatory Visit: Payer: Self-pay

## 2020-01-31 ENCOUNTER — Other Ambulatory Visit: Payer: Medicare Other

## 2020-01-31 DIAGNOSIS — Z515 Encounter for palliative care: Secondary | ICD-10-CM

## 2020-01-31 NOTE — Progress Notes (Signed)
PATIENT NAME: Joyce [name] DOB: [birth year] MRN: [id number]  PRIMARY CARE PROVIDER: Jerrol Banana., MD  RESPONSIBLE PARTY:  Acct ID - Guarantor Home Phone Work Phone Relationship Acct Type  [id number] FAYRENE, [phone]  Self P/F     [address] [place], [place] [address]    PLAN OF CARE and INTERVENTIONS:               1.  GOALS OF CARE/ ADVANCE CARE PLANNING:  Remain in home with daughter and son in law and be comfortable.               2.  PATIENT/CAREGIVER EDUCATION:  Education on fall precautions, education on attempting to eat more and increase snacks to prevent weight loss, reviewed meds, support               3.  DISEASE STATUS: RN made scheduled palliative care home visit. RN met with patient and her daughter Joyce [name]. Patient sitting in chair in her room coloring. Patient denies having pain at the present time. Daughter reports she feels patient has had increased anxiety lately. Daughter States MD informed her she could increase patients Mirtazapine to 30 mg nightly. Patient currently taking 1.5 tabs of Mirtazapine.  Daughter states she did not want to double medication and wanted to try one and a half tabs. Patient also continues to take lorazepam 1/2 tab once or twice daily. Patient's weight yesterday 117 lbs. Patients wait at MD's office in January  was 122 pounds. Daughter concerned about patient losing weight. Education to daughter not to worry and let patient eat what she wants and add nutritional supplements and snacks to diet. Patient sounds congested today however this is not a new finding. Patient has not completed nebulizer treatment this morning. Patient reports she has a productive cough of creamy colored sputum. Patient reports she had boiled egg, two slices of toast and coffee for breakfast. Patient feels like she is sleeping enough and typically goes to bed at 10 PM and sleeps until 7 AM. Patient reports she does have shortness of breath on exertion. Patient to  see MD again in May per daughter. Patient reports she remains able to ambulate with her rollator walker and take short walks around the home. Daughter states patient is concerned about falling when she showers and patient gets a shower weekly with daughters assist.  Patient continues to have large thick  scab on second toe on right foot. Patients sacral decubitus remains unchanged and area is pink with no sloth or signs or symptoms of infection. Patient has no changes in medications other than increase in Mirtazapine. Patient and daughter remain in agreement with palliative care services. Patient and daughter encouraged to contact palliative care with questions or concerns.     HISTORY OF PRESENT ILLNESS:  Patient is a 86 year old female who resides in home with her daughter Joyce [name] and son in law.  Patient is followed by palliative care and is seen monthly and PRN.  CODE STATUS: DNR  ADVANCED DIRECTIVES:Y MOST FORM: No PPS: 40%   PHYSICAL EXAM:   VITALS: Today's Vitals   01/31/20 1125  Resp: 18  Temp: 97.8 F (36.6 C)  TempSrc: Temporal  SpO2: 92%  Weight: 117 lb (53.1 kg)  PainSc: 0-No pain    LUNGS: scattered rhonchi bilaterally CARDIAC: Cor RRR EXTREMITIES: 1+ edema SKIN: Stage ii decub betweek sacral folds, zince oxide to area daily, nonstageable wound on 2nd tow on right foot, no treatment  continue to monitor.  NEURO: positive for gait problems, memory problems and weakness       Virgie Jollay, RN 

## 2020-02-29 ENCOUNTER — Telehealth: Payer: Self-pay

## 2020-02-29 NOTE — Telephone Encounter (Signed)
Telephone call to patient/family to schedule palliative care visit with patient. Patient/family in agreement with home visit on 03-03-20 at 10:30AM.

## 2020-03-03 ENCOUNTER — Other Ambulatory Visit: Payer: Medicare Other

## 2020-03-03 ENCOUNTER — Other Ambulatory Visit: Payer: Self-pay

## 2020-03-03 DIAGNOSIS — Z515 Encounter for palliative care: Secondary | ICD-10-CM

## 2020-03-03 NOTE — Progress Notes (Signed)
COMMUNITY PALLIATIVE CARE SW NOTE  PATIENT NAME: Joyce [name] DOB: [birth year] MRN: [id number]  PRIMARY CARE PROVIDER: Jerrol Banana., MD  RESPONSIBLE PARTY:  Acct ID - Guarantor Home Phone Work Phone Relationship Acct Type  [id number] Joyce [name], Joyce [name]  Self P/F     [address], [place], [place] [address]     PLAN OF CARE and INTERVENTIONS:             1. GOALS OF CARE/ ADVANCE CARE PLANNING:  Patient is a DNR,form is in the home.HCPOAis Joyce [name] (patient's daughter).Living Joyce [name] complete. Goal isfor patientto remain at home. 2. SOCIAL/EMOTIONAL/SPIRITUAL ASSESSMENT/ INTERVENTIONS:  SW met with patient in the home. Patient feels that she is doing "good". Patient was sitting up in her recliner. Patient denies pain. Patient reports numbness in her fingers, is trying to use play-doh to work her hands. Patient is sleeping well, sleeps in her recliner. Patient's appetite is good and patient tries to snack throughout the day. Patient does have a cough at times and gets SOB with exertion. Patient continues to use nebulizer PRN. Discussed patient's upcoming birthday. Talked about aging and the changes that occur in life. Discussed increased dependence. Patient shared her thankfulness for family that is supportive. SW provided supportive counseling, assessed needs, discussed coping skills, and used active and reflective listening. 3. PATIENT/CAREGIVER EDUCATION/ COPING:  Patient was alert, engaged in conversation. Patient is forgetful at times. Patient was more talkative this visit. Patient denies feeling sad or down. Patient also denies any fear or anxiety. Joyce [name] feels patient does worry and is more withdrawn. Patient said she is enjoys watching game shows, doing crosswords and watching birds at the feeder near her window. SW encouraged patient to eat meals at the kitchen table and go outside more.  4. PERSONAL EMERGENCY PLAN:  Family will call [phone]. Patient has a  [place] 5. COMMUNITY RESOURCES COORDINATION/ HEALTH CARE NAVIGATION:  CTye Marylandhelps to coordinate patient's care. PCP visit scheduled for 5/11.  6. FINANCIAL/LEGAL CONCERNS/INTERVENTIONS:  None.     SOCIAL HX:  Social History   Tobacco Use  . Smoking status: Never Smoker  . Smokeless tobacco: Never Used  Substance Use Topics  . Alcohol use: Not Currently    CODE STATUS: DNR ADVANCED DIRECTIVES: Y MOST FORM COMPLETE:  No. HOSPICE EDUCATION PROVIDED: None.  PPS: Patient is mostly independent of ADLs.CTye Marylandhelps patient with showers. Patientis ambulatingwell withherwalker.  I spent435mutes with patient/family, from10:30-11:15aproviding education, support and consultation.   WhMargaretmary LombardLCSW

## 2020-03-09 NOTE — Progress Notes (Signed)
Trena Platt Delina Kruczek,acting as a scribe for [name], MD.,have documented all relevant documentation on the behalf of [name], MD,as directed by  [name], MD while in the presence of [name], MD.  Established patient visit   Patient: Joyce [name]   DOB: January 28, 1928   86 y.o. Female  MRN: [id number] Visit Date: 03/14/2020  Today's healthcare provider: [name], MD   Chief Complaint  Patient presents with  . Hypertension   Subjective    HPI Overall patient is doing well.  More problems with anxiety recently.  I did not know that this to few years ago the patient was over 300 pounds. Hypertension, follow-up  BP Readings from Last 3 Encounters:  03/14/20 128/72  12/13/19 (!) 102/48  11/10/19 140/70   Wt Readings from Last 3 Encounters:  03/14/20 117 lb 12.8 oz (53.4 kg)  01/31/20 117 lb (53.1 kg)  11/10/19 122 lb (55.3 kg)     She was last seen for hypertension 6 months ago.  BP at that visit was 133/79. Management since that visit includes; On diltiazem. She reports good compliance with treatment. She is not having side effects.  She is not exercising. She is not adherent to low salt diet.   Outside blood pressures are not checking.  She does not smoke.  Use of agents associated with hypertension: none.   ---------------------------------------------------------------------------------------------------   Social History   Tobacco Use  . Smoking status: Never Smoker  . Smokeless tobacco: Never Used  Substance Use Topics  . Alcohol use: Not Currently  . Drug use: No       Medications: Outpatient Medications Prior to Visit  Medication Sig  . acetaminophen (TYLENOL) 325 MG tablet Take 650 mg by mouth 2 (two) times daily. Taking two tablets every morning.  Marland Kitchen ADVAIR DISKUS 250-50 MCG/DOSE AEPB INHALE 1 PUFF BY MOUTH TWICE DAILY. RINSE MOUTH WITH WATER AFTER USE TO REDUCE AFTERTASTE AND INCIDENCE OF  CANDIDIASIS. DO NOT SWALLOW  . albuterol (PROVENTIL) (2.5 MG/3ML) 0.083% nebulizer solution Take 3 mLs (2.5 mg total) by nebulization every 6 (six) hours as needed for wheezing or shortness of breath.  Marland Kitchen alendronate (FOSAMAX) 70 MG tablet TAKE 1 TABLET BY MOUTH ONCE A WEEK WITH A FULL GLASS OF WATER ON AN EMPTY STOMACH  . atorvastatin (LIPITOR) 10 MG tablet TAKE 1 TABLET BY MOUTH DAILY  . calcium carbonate (TUMS - DOSED IN MG ELEMENTAL CALCIUM) 500 MG chewable tablet Chew 2 tablets by mouth daily.   . cholecalciferol (VITAMIN D) 1000 units tablet Take 2,000 Units by mouth daily.  . feeding supplement, ENSURE ENLIVE, (ENSURE ENLIVE) LIQD Take 237 mLs by mouth 2 (two) times daily between meals.  . Fluticasone-Salmeterol (ADVAIR DISKUS) 250-50 MCG/DOSE AEPB Inhale 1 puff into the lungs 2 (two) times daily.  Marland Kitchen levothyroxine (SYNTHROID) 100 MCG tablet TAKE 1 TABLET(100 MCG) BY MOUTH DAILY  . LORazepam (ATIVAN) 0.5 MG tablet Take 0.5 tablets (0.25 mg total) by mouth 2 (two) times daily as needed for anxiety.  . mirtazapine (REMERON) 15 MG tablet TAKE 1 TABLET(15 MG) BY MOUTH AT BEDTIME  . Multiple Vitamin (MULTIVITAMIN WITH MINERALS) TABS tablet Take 1 tablet by mouth daily.  Marland Kitchen acidophilus (RISAQUAD) CAPS capsule Take 1 capsule by mouth daily.  Marland Kitchen albuterol (PROVENTIL HFA;VENTOLIN HFA) 108 (90 Base) MCG/ACT inhaler Inhale 1-2 puffs into the lungs every 4 (four) hours as needed for wheezing or shortness of breath. (Patient not taking: Reported on  03/14/2020)  . clotrimazole (CLOTRIMAZOLE ATHLETES FOOT) 1 % cream Apply 1 application topically 2 (two) times daily. Between toes, not on any open skin/ for fungus (Patient not taking: Reported on 03/14/2020)  . DILT-XR 120 MG 24 hr capsule TAKE 2 CAPSULES BY MOUTH EVERY MORNING AND 1 CAPSULE EVERY EVENING (Patient not taking: Reported on 03/14/2020)  . diltiazem (CARDIZEM CD) 120 MG 24 hr capsule TK 2 CS PO QAM AND 1 C QPM  . doxycycline (VIBRA-TABS) 100 MG tablet  Take 1 tablet (100 mg total) by mouth 2 (two) times daily. (Patient not taking: Reported on 03/14/2020)  . levofloxacin (LEVAQUIN) 500 MG tablet Take 1 tablet (500 mg total) by mouth daily. (Patient not taking: Reported on 03/14/2020)  . mupirocin ointment (BACTROBAN) 2 % Apply 1 application topically 2 (two) times daily. Apply very small thin amount after cleaning with soap and water. (Patient not taking: Reported on 03/14/2020)   No facility-administered medications prior to visit.    Review of Systems  Constitutional: Negative for appetite change, chills, fatigue and fever.  Eyes: Negative.   Respiratory: Negative for chest tightness and shortness of breath.   Cardiovascular: Negative for chest pain and palpitations.  Gastrointestinal: Negative for abdominal pain, nausea and vomiting.  Endocrine: Negative.   Skin: Negative.   Allergic/Immunologic: Negative.   Neurological: Negative for dizziness and weakness.  Hematological: Negative.   Psychiatric/Behavioral: The patient is nervous/anxious.        Objective    BP 128/72 (BP Location: Right Arm, Patient Position: Sitting, Cuff Size: Normal)   Pulse 87   Temp 97.7 F (36.5 C) (Temporal)   Ht 5' (1.524 m)   Wt 117 lb 12.8 oz (53.4 kg)   BMI 23.01 kg/m  BP Readings from Last 3 Encounters:  03/14/20 128/72  12/13/19 (!) 102/48  11/10/19 140/70   Wt Readings from Last 3 Encounters:  03/14/20 117 lb 12.8 oz (53.4 kg)  01/31/20 117 lb (53.1 kg)  11/10/19 122 lb (55.3 kg)      Physical Exam Vitals reviewed.  Constitutional:      Appearance: She is not toxic-appearing.  HENT:     Head: Normocephalic and atraumatic.     Right Ear: External ear normal.     Left Ear: External ear normal.  Eyes:     General: No scleral icterus.    Conjunctiva/sclera: Conjunctivae normal.  Cardiovascular:     Rate and Rhythm: Normal rate and regular rhythm.     Heart sounds: Normal heart sounds.  Pulmonary:     Effort: Pulmonary effort is  normal.     Comments: Left greater than right rhonchi. Abdominal:     Palpations: Abdomen is soft.  Skin:    General: Skin is warm and dry.     Comments: Mildly erythematous and indurated skin of right greater than left lower leg.The induration is more striking than erythema and there is no tenderness or drainage.  Neurological:     General: No focal deficit present.     Mental Status: She is alert and oriented to person, place, and time. Mental status is at baseline.  Psychiatric:        Mood and Affect: Mood normal.        Behavior: Behavior normal.        Thought Content: Thought content normal.        Judgment: Judgment normal.       No results found for any visits on 03/14/20.  Assessment & Plan  1. Generalized anxiety disorder Increase mirtazapine from 15 to 30 mg daily. - mirtazapine (REMERON) 30 MG tablet; Take 1 tablet (30 mg total) by mouth at bedtime.  Dispense: 30 tablet; Refill: 3  2. Essential hypertension Good control.  3. Chronic venous hypertension (idiopathic) without complications of bilateral lower extremity Legs look much better recently.  4. Bronchiectasis without acute exacerbation ([place]) Chronic issue.  No recent exacerbations. Long-term prognosis is poor but patient has been doing well lately. 5. Personal history of malignant neoplasm of breast   6. Memory loss or impairment   7. Mixed hyperlipidemia    No follow-ups on file.      I, [name], MD, have reviewed all documentation for this visit. The documentation on 03/19/20 for the exam, diagnosis, procedures, and orders are all accurate and complete.    Richard [name], MD  [facility] [phone] (phone) [phone] (fax)  [place]

## 2020-03-11 ENCOUNTER — Other Ambulatory Visit: Payer: Self-pay | Admitting: Family Medicine

## 2020-03-11 NOTE — Telephone Encounter (Signed)
Requested Prescriptions  Pending Prescriptions Disp Refills  . levothyroxine (SYNTHROID) 100 MCG tablet [Pharmacy Med Name: LEVOTHYROXINE 0.100MG  (100MCG) TAB] 90 tablet 0    Sig: TAKE 1 TABLET(100 MCG) BY MOUTH DAILY     Endocrinology:  Hypothyroid Agents Failed - 03/11/2020 12:50 PM      Failed - TSH needs to be rechecked within 3 months after an abnormal result. Refill until TSH is due.      Failed - TSH in normal range and within 360 days    TSH  Date Value Ref Range Status  09/15/2019 0.272 (L) 0.450 - 4.500 uIU/mL Final         Passed - Valid encounter within last 12 months    Recent Outpatient Visits          4 months ago Sacral decubitus ulcer, stage III [facility])   [facility], Kelby Aline, FNP   5 months ago Blister of toe of left foot with infection, initial encounter   [facility] Flinchum, Kelby Aline, FNP   5 months ago Essential hypertension   [facility] Jerrol Banana., MD   10 months ago Cellulitis of lower extremity, unspecified laterality   [place], [place]   10 months ago Cellulitis of lower extremity, unspecified laterality   [facility] [place], Clearnce Sorrel, [place]      Future Appointments            In 3 days Jerrol Banana., MD [facility], [place]

## 2020-03-14 ENCOUNTER — Encounter: Payer: Self-pay | Admitting: Family Medicine

## 2020-03-14 ENCOUNTER — Other Ambulatory Visit: Payer: Self-pay

## 2020-03-14 ENCOUNTER — Ambulatory Visit (INDEPENDENT_AMBULATORY_CARE_PROVIDER_SITE_OTHER): Payer: Medicare Other | Admitting: Family Medicine

## 2020-03-14 VITALS — BP 128/72 | HR 87 | Temp 97.7°F | Ht 60.0 in | Wt 117.8 lb

## 2020-03-14 DIAGNOSIS — I87303 Chronic venous hypertension (idiopathic) without complications of bilateral lower extremity: Secondary | ICD-10-CM

## 2020-03-14 DIAGNOSIS — Z853 Personal history of malignant neoplasm of breast: Secondary | ICD-10-CM | POA: Diagnosis not present

## 2020-03-14 DIAGNOSIS — E782 Mixed hyperlipidemia: Secondary | ICD-10-CM

## 2020-03-14 DIAGNOSIS — F411 Generalized anxiety disorder: Secondary | ICD-10-CM

## 2020-03-14 DIAGNOSIS — J479 Bronchiectasis, uncomplicated: Secondary | ICD-10-CM | POA: Diagnosis not present

## 2020-03-14 DIAGNOSIS — R413 Other amnesia: Secondary | ICD-10-CM | POA: Diagnosis not present

## 2020-03-14 DIAGNOSIS — I10 Essential (primary) hypertension: Secondary | ICD-10-CM

## 2020-03-14 MED ORDER — MIRTAZAPINE 30 MG PO TABS: 30.0000 mg | ORAL_TABLET | Freq: Every day | ORAL | 3 refills | Status: DC

## 2020-03-23 ENCOUNTER — Telehealth: Payer: Self-pay

## 2020-03-23 NOTE — Telephone Encounter (Signed)
Telephone call to schedule palliative care visit.  [name] in agreement with RN making home visit 03/31/20 at 9:00 AM.

## 2020-03-23 NOTE — Telephone Encounter (Signed)
Telephone call to patients [name] to schedule palliative care visit.  RN left message to call back to schedule visit.

## 2020-03-30 ENCOUNTER — Telehealth: Payer: Self-pay

## 2020-03-30 NOTE — Telephone Encounter (Signed)
Telephone call from patients [name].  [name] states she wants to cancel palliative care visit that is scheduled for 9:00 AM on Friday 03/31/20.  [name] reports they are packing to move and states she will call RN to reschedule appointment once they move.

## 2020-03-31 ENCOUNTER — Other Ambulatory Visit: Payer: Medicare Other

## 2020-04-08 ENCOUNTER — Other Ambulatory Visit: Payer: Self-pay | Admitting: Family Medicine

## 2020-04-08 NOTE — Telephone Encounter (Signed)
Requested Prescriptions  Pending Prescriptions Disp Refills  . DILT-XR 120 MG 24 hr capsule [Pharmacy Med Name: DILTIAZEM XR 120MG  CAPSULES (24 HR)] 270 capsule 0    Sig: TAKE 2 CAPSULES BY MOUTH EVERY MORNING AND 1 CAPSULE EVERY EVENING     Cardiovascular:  Calcium Channel Blockers Passed - 04/08/2020  9:36 AM      Passed - Last BP in normal range    BP Readings from Last 1 Encounters:  03/14/20 128/72         Passed - Valid encounter within last 6 months    Recent Outpatient Visits          3 weeks ago Generalized anxiety disorder   [facility] Jerrol Banana., MD   5 months ago Sacral decubitus ulcer, stage III [facility])   [facility] Flinchum, Kelby Aline, FNP   6 months ago Blister of toe of left foot with infection, initial encounter   [facility] Flinchum, Kelby Aline, FNP   6 months ago Essential hypertension   [facility] Jerrol Banana., MD   11 months ago Cellulitis of lower extremity, unspecified laterality   [facility], Clearnce Sorrel, [place]      Future Appointments            In 1 month Jerrol Banana., MD [facility], [place]

## 2020-04-24 ENCOUNTER — Telehealth: Payer: Self-pay

## 2020-04-24 NOTE — Telephone Encounter (Signed)
Telephone call to schedule palliative care visit.  Daughter in agreement with palliative care team making home visit 05/03/20 at 9:30 AM.

## 2020-04-28 ENCOUNTER — Other Ambulatory Visit: Payer: Self-pay | Admitting: Adult Health

## 2020-04-28 DIAGNOSIS — F411 Generalized anxiety disorder: Secondary | ICD-10-CM

## 2020-04-28 NOTE — Telephone Encounter (Signed)
Requested medication (s) are due for refill today: no  Requested medication (s) are on the active medication list: yes  Last refill:  10/15/2019  Future visit scheduled: yes  Notes to clinic:  This refill cannot be delegated  Requested Prescriptions  Pending Prescriptions Disp Refills   LORazepam (ATIVAN) 0.5 MG tablet [Pharmacy Med Name: LORAZEPAM 0.5MG  TABLETS] 60 tablet     Sig: TAKE 1/2 TABLET(0.25 MG) BY MOUTH TWICE DAILY AS NEEDED FOR ANXIETY      Not Delegated - Psychiatry:  Anxiolytics/Hypnotics Failed - 04/28/2020  9:07 AM      Failed - This refill cannot be delegated      Failed - Urine Drug Screen completed in last 360 days.      Passed - Valid encounter within last 6 months    Recent Outpatient Visits           1 month ago Generalized anxiety disorder   [facility] Jerrol Banana., MD   6 months ago Sacral decubitus ulcer, stage III [facility])   [facility] Flinchum, Kelby Aline, FNP   6 months ago Blister of toe of left foot with infection, initial encounter   [facility] Flinchum, Kelby Aline, FNP   7 months ago Essential hypertension   [facility] Jerrol Banana., MD   11 months ago Cellulitis of lower extremity, unspecified laterality   [facility] [place], Clearnce Sorrel, [place]       Future Appointments             In 2 weeks Jerrol Banana., MD [facility], [place]

## 2020-05-01 ENCOUNTER — Other Ambulatory Visit: Payer: Self-pay

## 2020-05-01 DIAGNOSIS — F411 Generalized anxiety disorder: Secondary | ICD-10-CM

## 2020-05-01 MED ORDER — LORAZEPAM 0.5 MG PO TABS: ORAL_TABLET | ORAL | 3 refills | Status: DC

## 2020-05-01 NOTE — Addendum Note (Signed)
Addended by: Gerald Stabs on: 05/01/2020 09:50 AM   Modules accepted: Orders

## 2020-05-01 NOTE — Telephone Encounter (Signed)
Copied from [place] [id number]. Topic: General - Other >> May 01, 2020  9:35 AM Leward Quan A wrote: Reason for CRM: Patient daughter called to inform Dr Rosanna Randy that Rx for LORazepam (ATIVAN) 0.5 MG tablet was not received by the pharmacy. Per snapshot Rx was printed and not sent to pharmacy. Patient would like this called in today please

## 2020-05-01 NOTE — Telephone Encounter (Signed)
Please advise? Medication was set for print instead of normal.

## 2020-05-01 NOTE — Telephone Encounter (Signed)
Medication refill sent to patient's pharmacy.  

## 2020-05-03 ENCOUNTER — Other Ambulatory Visit: Payer: Self-pay

## 2020-05-03 ENCOUNTER — Other Ambulatory Visit: Payer: Medicare Other

## 2020-05-03 NOTE — Progress Notes (Signed)
PATIENT NAME: Joyce [name] DOB: 12-28-1927 MRN: [id number]  PRIMARY CARE PROVIDER: Jerrol Banana., MD  RESPONSIBLE PARTY:  Acct ID - Guarantor Home Phone Work Phone Relationship Acct Type  [id number] LONDYNN, [phone]  Self P/F     [address] [place], [place] [address]    PLAN OF CARE and INTERVENTIONS:               1.  GOALS OF CARE/ ADVANCE CARE PLANNING:  Patient wants to remain in home with daughter and son in law and be comfortable.               2.  PATIENT/CAREGIVER EDUCATION:  Education on fall precautions, education on s/s of infection, education on skin breakdown, reviewed meds, support                 3.  DISEASE STATUS:  RN made scheduled palliative care home visit. Nurse met with patient and her [name]. Patient sitting in chair in her room watching TV. Patient denies having any pain other than some stinging where she has a stage 2 sacral decubitus between sacral folds.  Sacral decubitus is enlarging and getting deeper. Daughter to make patient appointment for patient at the wound center to have wound evaluated. Patient has been seen at the wound care center for decubitus several months ago.  Patient has not had any MD appointments since nurse last saw patient. Patient and family have recently moved. Patient continues to ambulate with her rollator walker. Patient denies suffering any recent falls. Daughter reports patient gets up and goes to the bathroom every hour. Daughter reports move was hard on patient but patient states she is becoming more acclimated to home. Patients current weigh 118 lbs. Patient reports her appetite is good and daughter reports patient eats plenty of protein. Patient continues to enjoy her snacks. Patient denies having any nausea or vomiting. Patient continues to refuse to sleep in a bed. Patient sleeps in her chair and puts feet up on pillow on chair. Patient has a congested sounding cough with rhonchi throughout lungs. Patient has not  been doing neb treatment as directed. Nurse encouraged patient to resume nebulizer treatments. Patient has edema in her lower extremities right greater than the left. Patients vital signs are stable other than temp slightly elevated at 99.2 today. Daughter reports no changes in patient's medications other than patients Mirtazapine was increased from 15 mg to 30 mg at bedtime. Patient and daughter remain in agreement with palliative care services. Patient and daughter encouraged to contact palliative care with questions or concerns.      HISTORY OF PRESENT ILLNESS:  Patient is a 86 year old female who resides in home with daughter and son in law.  Patient is followed by palliative care and is seen monthly and PRN.   CODE STATUS: DNR  ADVANCED DIRECTIVES: Y MOST FORM: No PPS: 50%   PHYSICAL EXAM:   VITALS: Today's Vitals   05/03/20 0955  BP: 118/64  Pulse: 95  Resp: 18  Temp: 99.2 F (37.3 C)  TempSrc: Temporal  SpO2: 94%  Weight: 118 lb (53.5 kg)  PainSc: 0-No pain    LUNGS: scattered rhonchi bilaterally CARDIAC: Cor RRR  EXTREMITIES: @= edema in RLE, 1+ edema in LLE SKIN: quarter size decub that is increasing and size and depth.  Daughter to call and schedule appt. for patient at the wound care center. NEURO: positive for gait problems, memory problems and weakness  Virgie Jollay, RN 

## 2020-05-12 NOTE — Progress Notes (Signed)
I,Laura E Walsh,acting as a scribe for [name], MD.,have documented all relevant documentation on the behalf of [name], MD,as directed by  [name], MD while in the presence of [name], MD.   Established patient visit   Patient: Joyce [name]   DOB: 05-13-1928   86 y.o. Female  MRN: [id number] Visit Date: 05/16/2020  Today's healthcare provider: [name], MD   Chief Complaint  Patient presents with  . Anxiety   Subjective    Anxiety Presents for follow-up visit. Symptoms include excessive worry (Worse in the mornings. ) and nervous/anxious behavior. Patient reports no chest pain, decreased concentration, depressed mood, insomnia, palpitations, panic, shortness of breath or suicidal ideas. The quality of sleep is good. Nighttime awakenings: none.    Overall patient is feeling fairly well.  She is breathing well.  Daughter and son-in-law care for her daily. Generalized anxiety disorder - 03/14/20 Increase mirtazapine from 15 to 30 mg daily  Needs a referral to the wound center.    Patient Active Problem List   Diagnosis Date Noted  . Anorexia 05/12/2019  . Palliative care encounter 05/12/2019  . Memory loss or impairment 05/12/2019  . Thyroid cancer ([place])   . Aspiration pneumonia ([place]) 01/29/2019  . Cellulitis of right leg 01/17/2019  . Cellulitis 01/17/2019  . Pressure injury of skin 01/17/2019  . Multifocal pneumonia 12/24/2018  . GERD (gastroesophageal reflux disease) 12/24/2018  . COPD with acute exacerbation ([place]) 12/24/2018  . Acute respiratory failure with hypoxia and hypercapnia ([place]) 12/24/2018  . Other nonrheumatic aortic valve disorders 10/14/2017  . Do not resuscitate status 08/27/2017  . Personal history of malignant neoplasm of breast 08/27/2017  . Dilated aortic root ([place]) 08/27/2017  . Chronic venous hypertension (idiopathic) without complications of bilateral lower extremity 07/25/2017  .  Tachycardia, unspecified 02/25/2017  . Hyponatremia 11/22/2016  . Osteoporosis 09/24/2016  . 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 Hematuria 07/02/2016  . Mass of hand 06/04/2016  . Other specified soft tissue disorders 06/04/2016  . Ascending aorta dilatation ([facility]) 05/15/2016  . Generalized anxiety disorder 10/24/2015  . Postoperative hypothyroidism 12/15/2013  . Papillary carcinoma of thyroid ([place]) 11/26/2013  . Primary hyperparathyroidism ([place]) 04/01/2013  . Primary hyperparathyroidism ([place]) 04/01/2013  . Osteoarthritis of knee 03/23/2013  . Unilateral primary osteoarthritis, unspecified knee 03/23/2013  . Nephrolithiasis 02/03/2013  . Malignant neoplasm of female breast ([place]) 11/28/2009  . Asthma 02/24/2009  . Bronchiectasis without acute exacerbation ([place]) 02/24/2009  . Asthma 02/24/2009  . Bronchiectasis without acute exacerbation ([place]) 02/24/2009  . Arthropathy of hand 06/16/2008  . Primary osteoarthritis, unspecified hand 06/16/2008  . Hyperlipidemia 11/16/2007  . Essential hypertension 11/16/2007  . Essential (primary) hypertension 11/16/2007   Past Medical History:  Diagnosis Date  . Anxiety   . Arthritis   . Asthma   . Asthma   . Breast cancer ([place])   . Cancer ([place])   . COPD (chronic obstructive pulmonary disease) ([place])   . Enlarged aorta ([place])   . GERD (gastroesophageal reflux disease)   . High cholesterol   . Hx of skin cancer, basal cell   . Hyperlipemia   . Hypertension   . PUD (peptic ulcer disease)   . Thyroid cancer [facility])    Social History   Tobacco Use  . Smoking status: Never Smoker  . Smokeless tobacco: Never Used  Vaping Use  . Vaping Use: Former  Substance Use Topics  . Alcohol use:  Not Currently  . Drug use: No   No Known Allergies   Medications: Outpatient Medications Prior to Visit  Medication Sig  . acetaminophen (TYLENOL) 325 MG tablet Take 650 mg by mouth 2 (two) times daily. Taking two tablets every morning.   Marland Kitchen acidophilus (RISAQUAD) CAPS capsule Take 1 capsule by mouth daily.  Marland Kitchen ADVAIR DISKUS 250-50 MCG/DOSE AEPB INHALE 1 PUFF BY MOUTH TWICE DAILY. RINSE MOUTH WITH WATER AFTER USE TO REDUCE AFTERTASTE AND INCIDENCE OF CANDIDIASIS. DO NOT SWALLOW  . albuterol (PROVENTIL HFA;VENTOLIN HFA) 108 (90 Base) MCG/ACT inhaler Inhale 1-2 puffs into the lungs every 4 (four) hours as needed for wheezing or shortness of breath.  Marland Kitchen albuterol (PROVENTIL) (2.5 MG/3ML) 0.083% nebulizer solution Take 3 mLs (2.5 mg total) by nebulization every 6 (six) hours as needed for wheezing or shortness of breath.  Marland Kitchen alendronate (FOSAMAX) 70 MG tablet TAKE 1 TABLET BY MOUTH ONCE A WEEK WITH A FULL GLASS OF WATER ON AN EMPTY STOMACH  . atorvastatin (LIPITOR) 10 MG tablet TAKE 1 TABLET BY MOUTH DAILY  . calcium carbonate (TUMS - DOSED IN MG ELEMENTAL CALCIUM) 500 MG chewable tablet Chew 2 tablets by mouth daily.   . cholecalciferol (VITAMIN D) 1000 units tablet Take 2,000 Units by mouth daily.  . clotrimazole (CLOTRIMAZOLE ATHLETES FOOT) 1 % cream Apply 1 application topically 2 (two) times daily. Between toes, not on any open skin/ for fungus  . DILT-XR 120 MG 24 hr capsule TAKE 2 CAPSULES BY MOUTH EVERY MORNING AND 1 CAPSULE EVERY EVENING  . diltiazem (CARDIZEM CD) 120 MG 24 hr capsule TK 2 CS PO QAM AND 1 C QPM  . feeding supplement, ENSURE ENLIVE, (ENSURE ENLIVE) LIQD Take 237 mLs by mouth 2 (two) times daily between meals.  . Fluticasone-Salmeterol (ADVAIR DISKUS) 250-50 MCG/DOSE AEPB Inhale 1 puff into the lungs 2 (two) times daily.  Marland Kitchen levothyroxine (SYNTHROID) 100 MCG tablet TAKE 1 TABLET(100 MCG) BY MOUTH DAILY  . LORazepam (ATIVAN) 0.5 MG tablet TAKE 1/2 TABLET(0.25 MG) BY MOUTH TWICE DAILY AS NEEDED FOR ANXIETY  . mirtazapine (REMERON) 30 MG tablet Take 1 tablet (30 mg total) by mouth at bedtime.  . Multiple Vitamin (MULTIVITAMIN WITH MINERALS) TABS tablet Take 1 tablet by mouth daily.  Marland Kitchen doxycycline (VIBRA-TABS) 100 MG  tablet Take 1 tablet (100 mg total) by mouth 2 (two) times daily. (Patient not taking: Reported on 03/14/2020)  . levofloxacin (LEVAQUIN) 500 MG tablet Take 1 tablet (500 mg total) by mouth daily. (Patient not taking: Reported on 03/14/2020)  . mupirocin ointment (BACTROBAN) 2 % Apply 1 application topically 2 (two) times daily. Apply very small thin amount after cleaning with soap and water. (Patient not taking: Reported on 03/14/2020)   No facility-administered medications prior to visit.    Review of Systems  Constitutional: Negative.   Respiratory: Negative for shortness of breath.   Cardiovascular: Negative for chest pain and palpitations.  Gastrointestinal: Negative.   Musculoskeletal: Negative.   Neurological: Positive for weakness (in the mornings.). Negative for dizziness, light-headedness and headaches.  Psychiatric/Behavioral: Negative for decreased concentration and suicidal ideas. The patient is nervous/anxious. The patient does not have insomnia.     Last CBC Lab Results  Component Value Date   WBC 12.3 (H) 10/15/2019   HGB 12.7 10/15/2019   HCT 40.6 10/15/2019   MCV 86 10/15/2019   MCH 26.8 10/15/2019   RDW 15.5 (H) 10/15/2019   PLT 285 43/32/9518   Last metabolic panel Lab Results  Component  Value Date   GLUCOSE 95 09/15/2019   NA 143 09/15/2019   K 3.7 09/15/2019   CL 96 09/15/2019   CO2 27 09/15/2019   BUN 17 09/15/2019   CREATININE 0.58 09/15/2019   GFRNONAA 82 09/15/2019   GFRAA 95 09/15/2019   CALCIUM 9.8 09/15/2019   PHOS 3.9 11/27/2016   PROT 6.9 09/15/2019   ALBUMIN 4.3 09/15/2019   LABGLOB 2.6 09/15/2019   AGRATIO 1.7 09/15/2019   BILITOT 0.4 09/15/2019   ALKPHOS 98 09/15/2019   AST 20 09/15/2019   ALT 14 09/15/2019   ANIONGAP 7 02/15/2019   Last lipids No results found for: CHOL, HDL, LDLCALC, LDLDIRECT, TRIG, CHOLHDL Last hemoglobin A1c No results found for: HGBA1C Last thyroid functions Lab Results  Component Value Date   TSH 0.272  (L) 09/15/2019   T4TOTAL 9.9 08/27/2018   Last vitamin D No results found for: 25OHVITD2, 25OHVITD3, VD25OH Last vitamin B12 and Folate No results found for: VITAMINB12, FOLATE  Objective    BP 126/64 (BP Location: Right Arm, Patient Position: Sitting, Cuff Size: Large)   Pulse 92   Temp (!) 97.1 F (36.2 C) (Temporal)   Wt 118 lb (53.5 kg)   SpO2 95%   BMI 23.05 kg/m  BP Readings from Last 3 Encounters:  05/16/20 126/64  05/03/20 118/64  03/14/20 128/72    Physical Exam Constitutional:      Appearance: Normal appearance.  HENT:     Head: Normocephalic and atraumatic.  Eyes:     Extraocular Movements: Extraocular movements intact.     Conjunctiva/sclera: Conjunctivae normal.     Pupils: Pupils are equal, round, and reactive to light.  Cardiovascular:     Rate and Rhythm: Normal rate and regular rhythm.     Pulses: Normal pulses.     Heart sounds: Normal heart sounds.  Pulmonary:     Effort: Pulmonary effort is normal.     Breath sounds: Normal breath sounds.     Comments: Mild diffuse Rhonchi   Abdominal:     Tenderness: There is no abdominal tenderness.  Musculoskeletal:     Right lower leg: No edema.     Left lower leg: No edema.  Lymphadenopathy:     Cervical: No cervical adenopathy.  Skin:    General: Skin is warm and dry.  Neurological:     Mental Status: She is alert and oriented to person, place, and time. Mental status is at baseline.  Psychiatric:        Mood and Affect: Mood normal.        Behavior: Behavior normal.        Thought Content: Thought content normal.        Judgment: Judgment normal.       No results found for any visits on 05/16/20.  Assessment & Plan     Problem List Items Addressed This Visit      Cardiovascular and Mediastinum   Essential hypertension    Well controlled Continue current medications Recheck metabolic panel F/u in 4 months       Relevant Orders   CBC with Differential/Platelet   Comprehensive  metabolic panel     Respiratory   Asthma    Chronic and stable.  Continue current medications.        Relevant Orders   CBC with Differential/Platelet     Musculoskeletal and Integument   Pressure injury of skin    Followed by the wound center.  Palliative care nurse noted that the  wound may be worsening.  Pt's caregivers will schedule an office visit.           Other   Generalized anxiety disorder - Primary    Improved with increased dose of mirtazapine 30mg .  Pt states Anxiety is worse in the mornings and is needing to take 0.25mg  Lorazepam in the mornings.  She only needs an additional Lorazepam a few times a week. Cautioned about a potential fall risk. No changes in medications will recheck in four months.        Relevant Orders   CBC with Differential/Platelet   TSH   Hyperlipidemia    Previously well controlled Continue statin Repeat FLP and CMP Goal LDL < 100       Relevant Orders   Lipid panel   Memory loss or impairment    Bronchiectasis without exacerbation   All issues clinically stable at this time.   Return in about 4 months (around 09/16/2020).      I, [name], MD, have reviewed all documentation for this visit. The documentation on 05/19/20 for the exam, diagnosis, procedures, and orders are all accurate and complete.    Killian Ress [name], MD  [facility] [phone] (phone) [phone] (fax)  [place]

## 2020-05-16 ENCOUNTER — Ambulatory Visit (INDEPENDENT_AMBULATORY_CARE_PROVIDER_SITE_OTHER): Payer: Medicare Other | Admitting: Family Medicine

## 2020-05-16 ENCOUNTER — Other Ambulatory Visit: Payer: Self-pay

## 2020-05-16 VITALS — BP 126/64 | HR 92 | Temp 97.1°F | Wt 118.0 lb

## 2020-05-16 DIAGNOSIS — R413 Other amnesia: Secondary | ICD-10-CM

## 2020-05-16 DIAGNOSIS — J45909 Unspecified asthma, uncomplicated: Secondary | ICD-10-CM

## 2020-05-16 DIAGNOSIS — F411 Generalized anxiety disorder: Secondary | ICD-10-CM | POA: Diagnosis not present

## 2020-05-16 DIAGNOSIS — E782 Mixed hyperlipidemia: Secondary | ICD-10-CM

## 2020-05-16 DIAGNOSIS — J479 Bronchiectasis, uncomplicated: Secondary | ICD-10-CM

## 2020-05-16 DIAGNOSIS — I10 Essential (primary) hypertension: Secondary | ICD-10-CM

## 2020-05-16 DIAGNOSIS — L89150 Pressure ulcer of sacral region, unstageable: Secondary | ICD-10-CM | POA: Diagnosis not present

## 2020-05-16 NOTE — Assessment & Plan Note (Addendum)
Improved with increased dose of mirtazapine 30mg .  Pt states Anxiety is worse in the mornings and is needing to take 0.25mg  Lorazepam in the mornings.  She only needs an additional Lorazepam a few times a week. Cautioned about a potential fall risk. No changes in medications will recheck in four months.

## 2020-05-16 NOTE — Patient Instructions (Signed)

## 2020-05-16 NOTE — Assessment & Plan Note (Signed)
Chronic and stable Continue current medications 

## 2020-05-16 NOTE — Assessment & Plan Note (Addendum)
Followed by the wound center.  Palliative care nurse noted that the wound may be worsening.  Pt's caregivers will schedule an office visit.

## 2020-05-16 NOTE — Assessment & Plan Note (Signed)
Well controlled Continue current medications Recheck metabolic panel F/u in 4 months

## 2020-05-16 NOTE — Assessment & Plan Note (Signed)
Previously well controlled Continue statin Repeat FLP and CMP Goal LDL < 100 

## 2020-05-17 LAB — COMPREHENSIVE METABOLIC PANEL
ALT: 17 IU/L (ref 0–32)
AST: 18 IU/L (ref 0–40)
Albumin/Globulin Ratio: 1.6 (ref 1.2–2.2)
Albumin: 3.9 g/dL (ref 3.5–4.6)
Alkaline Phosphatase: 98 IU/L (ref 48–121)
BUN/Creatinine Ratio: 26 (ref 12–28)
BUN: 15 mg/dL (ref 10–36)
Bilirubin Total: 0.5 mg/dL (ref 0.0–1.2)
CO2: 29 mmol/L (ref 20–29)
Calcium: 9.3 mg/dL (ref 8.7–10.3)
Chloride: 97 mmol/L (ref 96–106)
Creatinine, Ser: 0.57 mg/dL (ref 0.57–1.00)
GFR calc Af Amer: 94 mL/min/{1.73_m2} (ref >59)
GFR calc non Af Amer: 82 mL/min/{1.73_m2} (ref >59)
Globulin, Total: 2.5 g/dL (ref 1.5–4.5)
Glucose: 83 mg/dL (ref 65–99)
Potassium: 4.1 mmol/L (ref 3.5–5.2)
Sodium: 141 mmol/L (ref 134–144)
Total Protein: 6.4 g/dL (ref 6.0–8.5)

## 2020-05-17 LAB — CBC WITH DIFFERENTIAL/PLATELET
Basophils Absolute: 0.1 10*3/uL (ref 0.0–0.2)
Basos: 1 %
EOS (ABSOLUTE): 0 10*3/uL (ref 0.0–0.4)
Eos: 0 %
Hematocrit: 38.9 % (ref 34.0–46.6)
Hemoglobin: 12.5 g/dL (ref 11.1–15.9)
Immature Grans (Abs): 0 10*3/uL (ref 0.0–0.1)
Immature Granulocytes: 0 %
Lymphocytes Absolute: 1.6 10*3/uL (ref 0.7–3.1)
Lymphs: 15 %
MCH: 29 pg (ref 26.6–33.0)
MCHC: 32.1 g/dL (ref 31.5–35.7)
MCV: 90 fL (ref 79–97)
Monocytes Absolute: 0.8 10*3/uL (ref 0.1–0.9)
Monocytes: 7 %
Neutrophils Absolute: 8.3 10*3/uL — ABNORMAL HIGH (ref 1.4–7.0)
Neutrophils: 77 %
Platelets: 293 10*3/uL (ref 150–450)
RBC: 4.31 x10E6/uL (ref 3.77–5.28)
RDW: 12.9 % (ref 11.7–15.4)
WBC: 10.8 10*3/uL (ref 3.4–10.8)

## 2020-05-17 LAB — LIPID PANEL
Chol/HDL Ratio: 2.5 ratio (ref 0.0–4.4)
Cholesterol, Total: 135 mg/dL (ref 100–199)
HDL: 55 mg/dL (ref >39)
LDL Chol Calc (NIH): 70 mg/dL (ref 0–99)
Triglycerides: 45 mg/dL (ref 0–149)
VLDL Cholesterol Cal: 10 mg/dL (ref 5–40)

## 2020-05-17 LAB — TSH: TSH: 7.72 u[IU]/mL — ABNORMAL HIGH (ref 0.450–4.500)

## 2020-05-18 ENCOUNTER — Other Ambulatory Visit: Payer: Self-pay | Admitting: Family Medicine

## 2020-05-19 ENCOUNTER — Telehealth: Payer: Self-pay

## 2020-05-19 NOTE — Telephone Encounter (Signed)
Patient advised of lab results

## 2020-05-19 NOTE — Telephone Encounter (Signed)
-----   Message from Jerrol Banana., MD sent at 05/17/2020  4:19 PM EDT ----- Labs stable.  We will follow-up on thyroid in the fall oral next visit.  Thyroid in reasonable range for her age.

## 2020-05-22 NOTE — Telephone Encounter (Signed)
Palliaitve care SW spoke with patients daughter, [name], to schedule in home visit. Visit confirmed for Tues 05-30-2020 @10am .

## 2020-05-23 ENCOUNTER — Telehealth: Payer: Self-pay | Admitting: Family Medicine

## 2020-05-23 MED ORDER — ALBUTEROL SULFATE (2.5 MG/3ML) 0.083% IN NEBU: INHALATION_SOLUTION | RESPIRATORY_TRACT | 12 refills | Status: AC

## 2020-05-23 NOTE — Telephone Encounter (Signed)
[place] with [facility] in [place] needs a diagnosis code for the Nebulizer solution   CB# [phone]

## 2020-05-23 NOTE — Telephone Encounter (Signed)
Resent rx with dx codes.

## 2020-05-30 ENCOUNTER — Other Ambulatory Visit: Payer: Self-pay

## 2020-05-30 ENCOUNTER — Telehealth: Payer: Self-pay

## 2020-05-30 ENCOUNTER — Other Ambulatory Visit: Payer: Medicare Other

## 2020-05-30 DIAGNOSIS — L89153 Pressure ulcer of sacral region, stage 3: Secondary | ICD-10-CM

## 2020-05-30 DIAGNOSIS — Z515 Encounter for palliative care: Secondary | ICD-10-CM

## 2020-05-30 NOTE — Telephone Encounter (Signed)
Copied from [place] [id number]. Topic: Referral - Request for Referral >> May 30, 2020 10:41 AM Hinda Lenis D wrote: Dahlia Byes / Social Worker  [phone] Call asking if the PT can be refer to  [place] - [phone] / please advise  .Marland KitchenMarland Kitchen

## 2020-05-30 NOTE — Progress Notes (Signed)
COMMUNITY PALLIATIVE CARE SW NOTE  PATIENT NAME: Joyce [name] DOB: 20-Apr-1928 MRN: [id number]  PRIMARY CARE PROVIDER: Jerrol Banana., MD  RESPONSIBLE PARTY:  Acct ID - Guarantor Home Phone Work Phone Relationship Acct Type  [id number] RILYA, [phone]  Self P/F     [address], [place], [place] [address]     PLAN OF CARE and INTERVENTIONS:             1. GOALS OF CARE/ ADVANCE CARE PLANNING:  Patient is a DNR. [name] is HCPOA. Patients goal is to avoid hospitalizations and remain in the home with daughter and SIL. 2. SOCIAL/EMOTIONAL/SPIRITUAL ASSESSMENT/ INTERVENTIONS:  SW met with patient and daughter, [name] in daughters home. Patient resides with daughter and SIL in a one story home. Daughter is primary caregiver. Patient sitting in recliner in her room during visit. Patient shared that she has been doing fine. Patient had recent doctor visit with PCP earlier this month, no med changes and labs were stable. PCP to have made referral to [facility], per daughter they can not see patient until end of Aug and they did not have a pleasant experience the last time they were there. SW suggested PCP send referral to a different wound care center in hopes they can see patient sooner due wound increasing in size and causing more patient to be more uncomfortable, patient and daughter open to referral being sent else where. Patient and daughter share that she is still struggling with her anxiety, patient continues to take lorazepam. Patient eating well and drinks boost supplements as well, current weight is 118lbs. Patient sleeps well, continues to sleep in recliner stating its comfortable for her and helps her breathing as well since she is sitting/lying up right. Patient becomes anxious when discussing other options such as a hospital bed. Patient shared the only pain she has is the arthritis in her hands of which she takes tylenol and the uncomfortableness of  the wound on her bottom, patient sits on a pressure reducing cushion. Patient and daughter had not other concerns for SW. discussed goals, reviewed care plan, provided emotional support, used active and reflective listening. 3. PATIENT/CAREGIVER EDUCATION/ COPING:  Patient A&O x3 and engaged in discussion with SW. Patient shared that her anxiety surfaces mostly in the mor ing after waking up, states that she wakes up almost in a startled state and that triggers her anxiety. Daughter also shared that they moved recently and patients anxiety had increased due to that as well. Patient shared that she is trying to cope with her anxiety by keeping her mind of things by watching love boat and coloring. Patient shared she is not sure why she becomes anxious, because she knows her daughter and SIL loves her and takes  Good care of her. Patient currently only takes half the dosage of her lorazepam and knows she can take more if needed. Daughter and SW encouraged patient to get out of her room more and socialize with daughter and SIL, patient stated she will try to do that. Patient has 4 other children that live in different states. Patient moved from [place] to reside with daughter nearly 2 years ago.   4. PERSONAL EMERGENCY PLAN:  Family to call 911 for emergencies. 5. COMMUNITY RESOURCES COORDINATION/ HEALTH CARE NAVIGATION:  Daughter manages care. Patient  Had [facility] nurse for wound care but they are no longer visiting. New referral to wound care center to be made. Daughter has contacts for  personal caregivers should she need to go out of town that can be with patient. 6. FINANCIAL/LEGAL CONCERNS/INTERVENTIONS:  None.     SOCIAL HX:  Social History   Tobacco Use  . Smoking status: Never Smoker  . Smokeless tobacco: Never Used  Substance Use Topics  . Alcohol use: Not Currently    CODE STATUS: DNR ADVANCED DIRECTIVES: Y MOST FORM COMPLETE:  N HOSPICE EDUCATION PROVIDED: N  PPS: Patient is ambulatory with  rollator. Daughter assist with bathing and meal prep, patient able to feed self.     Time Spent: 30 min  [place], [place]

## 2020-05-30 NOTE — Telephone Encounter (Signed)
We need a reason for referral to wound center.  She might need to be seen to document this.  Okay to try to put in the referral if we have a reason.

## 2020-05-31 NOTE — Telephone Encounter (Signed)
Quarter sized wound is on patient's bottom.

## 2020-05-31 NOTE — Addendum Note (Signed)
Addended by: Julieta Bellini on: 05/31/2020 02:34 PM   Modules accepted: Orders

## 2020-05-31 NOTE — Telephone Encounter (Signed)
Per Dr. Rosanna Randy okay to order wound care. Order placed.

## 2020-06-03 ENCOUNTER — Other Ambulatory Visit: Payer: Self-pay | Admitting: Family Medicine

## 2020-06-03 NOTE — Telephone Encounter (Signed)
Requested Prescriptions  Pending Prescriptions Disp Refills  . levothyroxine (SYNTHROID) 100 MCG tablet [Pharmacy Med Name: LEVOTHYROXINE 0.100MG  (100MCG) TAB] 90 tablet 0    Sig: TAKE 1 TABLET(100 MCG) BY MOUTH DAILY     Endocrinology:  Hypothyroid Agents Failed - 06/03/2020  1:06 PM      Failed - TSH needs to be rechecked within 3 months after an abnormal result. Refill until TSH is due.      Failed - TSH in normal range and within 360 days    TSH  Date Value Ref Range Status  05/16/2020 7.720 (H) 0.450 - 4.500 uIU/mL Final         Passed - Valid encounter within last 12 months    Recent Outpatient Visits          2 weeks ago Generalized anxiety disorder   [facility] Jerrol Banana., MD   2 months ago Generalized anxiety disorder   [facility] Jerrol Banana., MD   7 months ago Sacral decubitus ulcer, stage III [facility])   [place], Kelby Aline, FNP   7 months ago Blister of toe of left foot with infection, initial encounter   [facility] [place], Kelby Aline, FNP   8 months ago Essential hypertension   [facility] Jerrol Banana., MD      Future Appointments            In 3 months Jerrol Banana., MD [facility], [place]

## 2020-06-06 NOTE — Progress Notes (Signed)
Subjective:   Joyce [name] is a 86 y.o. female who presents for Medicare Annual (Subsequent) preventive examination.  I connected with Joyce [name] (ok per DPR) today by telephone and verified that I am speaking with the correct person using two identifiers. Location patient: home Location provider: work Persons participating in the virtual visit: patient, provider.   I discussed the limitations, risks, security and privacy concerns of performing an evaluation and management service by telephone and the availability of in person appointments. I also discussed with the patient that there may be a patient responsible charge related to this service. The patient expressed understanding and verbally consented to this telephonic visit.    Interactive audio and video telecommunications were attempted between this provider and patient, however failed, due to patient having technical difficulties OR patient did not have access to video capability.  We continued and completed visit with audio only.   Review of Systems    N/A  Cardiac Risk Factors include: advanced age (>47mn, >>24women);dyslipidemia;hypertension     Objective:    There were no vitals filed for this visit. There is no height or weight on file to calculate BMI.  Advanced Directives 06/07/2020 06/02/2019 02/15/2019 01/29/2019 01/29/2019 01/17/2019 01/17/2019  Does Patient Have a Medical Advance Directive? Yes Yes No Yes Yes Yes No  Type of AParamed[place]of [place] will [place] will;Out of facility DNR (pink MOST or yellow form) Healthcare Power of [place]-  Does patient want to make changes to medical advance directive? - - - No - Patient declined - No - Patient declined -  Copy of [place] Chart? Yes - validated most recent copy scanned in chart (See row information) No - copy requested - No -  copy requested - No - copy requested -  Would patient like information on creating a medical advance directive? - - - No - Patient declined - No - Patient declined -    Current Medications (verified) Outpatient Encounter Medications as of 06/07/2020  Medication Sig  . acetaminophen (TYLENOL) 325 MG tablet Take 650 mg by mouth 2 (two) times daily. Taking two tablets every morning.  .Marland KitchenADVAIR DISKUS 250-50 MCG/DOSE AEPB INHALE 1 PUFF BY MOUTH TWICE DAILY. RINSE MOUTH WITH WATER AFTER USE TO REDUCE AFTERTASTE AND INCIDENCE OF CANDIDIASIS. DO NOT SWALLOW  . albuterol (PROVENTIL) (2.5 MG/3ML) 0.083% nebulizer solution USE 1 VIAL VIA NEBULIZER EVERY 6 HOURS AS NEEDED FOR WHEEZING OR SHORTNESS OF BREATH  . alendronate (FOSAMAX) 70 MG tablet TAKE 1 TABLET BY MOUTH ONCE A WEEK WITH A FULL GLASS OF WATER ON AN EMPTY STOMACH  . atorvastatin (LIPITOR) 10 MG tablet TAKE 1 TABLET BY MOUTH DAILY  . calcium carbonate (TUMS - DOSED IN MG ELEMENTAL CALCIUM) 500 MG chewable tablet Chew 2 tablets by mouth daily.   . cholecalciferol (VITAMIN D) 1000 units tablet Take 2,000 Units by mouth daily.  .Marland KitchenDILT-XR 120 MG 24 hr capsule TAKE 2 CAPSULES BY MOUTH EVERY MORNING AND 1 CAPSULE EVERY EVENING  . feeding supplement, ENSURE ENLIVE, (ENSURE ENLIVE) LIQD Take 237 mLs by mouth 2 (two) times daily between meals.  .Marland Kitchenlevothyroxine (SYNTHROID) 100 MCG tablet TAKE 1 TABLET(100 MCG) BY MOUTH DAILY  . LORazepam (ATIVAN) 0.5 MG tablet TAKE 1/2 TABLET(0.25 MG) BY MOUTH TWICE DAILY AS NEEDED FOR ANXIETY  . mirtazapine (REMERON) 30 MG tablet Take 1 tablet (30 mg total)  by mouth at bedtime.  . Multiple Vitamin (MULTIVITAMIN WITH MINERALS) TABS tablet Take 1 tablet by mouth daily.  Marland Kitchen acidophilus (RISAQUAD) CAPS capsule Take 1 capsule by mouth daily. (Patient not taking: Reported on 06/07/2020)  . clotrimazole (CLOTRIMAZOLE ATHLETES FOOT) 1 % cream Apply 1 application topically 2 (two) times daily. Between toes, not on any open skin/ for  fungus (Patient not taking: Reported on 06/07/2020)  . diltiazem (CARDIZEM CD) 120 MG 24 hr capsule TK 2 CS PO QAM AND 1 C QPM (Patient not taking: Reported on 06/07/2020)  . doxycycline (VIBRA-TABS) 100 MG tablet Take 1 tablet (100 mg total) by mouth 2 (two) times daily. (Patient not taking: Reported on 03/14/2020)  . Fluticasone-Salmeterol (ADVAIR DISKUS) 250-50 MCG/DOSE AEPB Inhale 1 puff into the lungs 2 (two) times daily. (Patient not taking: Reported on 06/07/2020)  . levofloxacin (LEVAQUIN) 500 MG tablet Take 1 tablet (500 mg total) by mouth daily. (Patient not taking: Reported on 03/14/2020)  . mupirocin ointment (BACTROBAN) 2 % Apply 1 application topically 2 (two) times daily. Apply very small thin amount after cleaning with soap and water. (Patient not taking: Reported on 03/14/2020)   No facility-administered encounter medications on file as of 06/07/2020.    Allergies (verified) Patient has no known allergies.   History: Past Medical History:  Diagnosis Date  . Anxiety   . Arthritis   . Asthma   . Asthma   . Breast cancer ([place])   . Cancer ([place])   . COPD (chronic obstructive pulmonary disease) ([place])   . Enlarged aorta ([place])   . GERD (gastroesophageal reflux disease)   . High cholesterol   . Hx of skin cancer, basal cell   . Hyperlipemia   . Hypertension   . PUD (peptic ulcer disease)   . Thyroid cancer [facility])    Past Surgical History:  Procedure Laterality Date  . APPENDECTOMY    . BREAST SURGERY     left mastectomy  . CHOLECYSTECTOMY    . FRACTURE SURGERY    . JOINT REPLACEMENT    . MASTECTOMY    . REPAIR OF PERFORATED ULCER    . REPLACEMENT TOTAL KNEE BILATERAL    . STOMACH SURGERY    . THYROIDECTOMY     Family History  Problem Relation Age of Onset  . Hypertension Father   . Colon cancer Father   . Stroke Mother   . Bone cancer Son   . Throat cancer Child   . Hyperlipidemia Daughter   . Esophageal cancer Son   . Cancer Son        bladder and myeloma  .  Hypertension Son   . Multiple myeloma Daughter   . Hypertension Daughter   . Hyperlipidemia Daughter    Social History   Socioeconomic History  . Marital status: Widowed    Spouse name: Not on file  . Number of children: 7  . Years of education: Not on file  . Highest education level: High school graduate  Occupational History  . Occupation: retired  Tobacco Use  . Smoking status: Never Smoker  . Smokeless tobacco: Never Used  Vaping Use  . Vaping Use: Former  Substance and Sexual Activity  . Alcohol use: Not Currently  . Drug use: No  . Sexual activity: Never  Other Topics Concern  . Not on file  Social History Narrative   Living with daughter- ambulates with a walker   Social Determinants of Health   Financial Resource Strain: Low Risk   .  Difficulty of Paying Living Expenses: Not hard at all  Food Insecurity: No Food Insecurity  . Worried About Charity fundraiser in the Last Year: Never true  . Ran Out of Food in the Last Year: Never true  Transportation Needs: No Transportation Needs  . Lack of Transportation (Medical): No  . Lack of Transportation (Non-Medical): No  Physical Activity: Inactive  . Days of Exercise per Week: 0 days  . Minutes of Exercise per Session: 0 min  Stress: Stress Concern Present  . Feeling of Stress : To some extent  Social Connections: Socially Isolated  . Frequency of Communication with Friends and Family: Once a week  . Frequency of Social Gatherings with Friends and Family: More than three times a week  . Attends Religious Services: Never  . Active Member of Clubs or Organizations: No  . Attends Archivist Meetings: Never  . Marital Status: Widowed    Tobacco Counseling Counseling given: Not Answered   Clinical Intake:  Pre-visit preparation completed: Yes  Pain : No/denies pain     Nutritional Risks: Non-healing wound (Has a wound on her buttocks. Pt has seen a wound care speciailist for this.) Diabetes:  No  How often do you need to have someone help you when you read instructions, pamphlets, or other written materials from your doctor or pharmacy?: 1 - Never  Diabetic? No  Interpreter Needed?: No  Information entered by :: [facility], LPN   Activities of Daily Living In your present state of health, do you have any difficulty performing the following activities: 06/07/2020  Hearing? Y  Comment Does not wear hearing aids.  Vision? N  Difficulty concentrating or making decisions? Y  Walking or climbing stairs? N  Dressing or bathing? N  Doing errands, shopping? Y  Comment Does not drive.  Preparing Food and eating ? Y  Comment Daughter assists with cooking.  Using the Toilet? N  In the past six months, have you accidently leaked urine? Y  Comment Wears protection at all times.  Do you have problems with loss of bowel control? N  Managing your Medications? Y  Comment Daughter assists with medications.  Managing your Finances? Y  Comment Daughter manages finances.  Housekeeping or managing your Housekeeping? Y  Comment Does not clean.  Some recent data might be hidden    Patient Care Team: Jerrol Banana., MD as PCP - General (Family Medicine) Woodroe Chen III, PA-C as Physician Assistant (Physician Assistant)  Indicate any recent Medical Services you may have received from other than [facility] providers in the past year (date may be approximate).     Assessment:   This is a routine wellness examination for Stevana.  Hearing/Vision screen No exam data present  Dietary issues and exercise activities discussed: Current Exercise Habits: The patient does not participate in regular exercise at present, Exercise limited by: orthopedic condition(s)  Goals    . DIET - INCREASE WATER INTAKE     Recommend to drink at least 6-8 8oz glasses of water per day.      Depression Screen PHQ 2/9 Scores 06/07/2020 06/02/2019 04/13/2019 12/31/2018 12/17/2018 08/06/2018  PHQ - 2 Score 0 0  0 0 0 0    Fall Risk Fall Risk  06/07/2020 06/02/2019 03/04/2019 12/31/2018 12/17/2018  Falls in the past year? 0 1 1 0 0  Number falls in past yr: 0 1 1 - -  Injury with Fall? 0 0 0 - -  Risk for fall  due to : - - History of fall(s) - -  Follow up - Falls prevention discussed Falls evaluation completed;Falls prevention discussed;Education provided - -    Any stairs in or around the home? Yes  If so, are there any without handrails? No  Home free of loose throw rugs in walkways, pet beds, electrical cords, etc? Yes  Adequate lighting in your home to reduce risk of falls? Yes   ASSISTIVE DEVICES UTILIZED TO PREVENT FALLS:  Life alert? Yes  Use of a cane, walker or w/c? Yes  Grab bars in the bathroom? Yes  Shower chair or bench in shower? Yes  Elevated toilet seat or a handicapped toilet? Yes    Cognitive Function: Unable to complete due to speaking with daughter to complete telephonic AWV.         Immunizations Immunization History  Administered Date(s) Administered  . Fluad Quad(high Dose 65+) 07/20/2019  . Influenza, High Dose Seasonal PF 07/30/2017, 08/06/2018  . Pneumococcal Conjugate-13 11/30/2013  . Pneumococcal Polysaccharide-23 08/12/2011  . Tdap 11/30/2013    TDAP status: Up to date Flu Vaccine status: Up to date Pneumococcal vaccine status: Up to date Covid-19 vaccine status: Completed vaccines  Qualifies for Shingles Vaccine? Yes   Zostavax completed No   Shingrix Completed?: No.    Education has been provided regarding the importance of this vaccine. Patient has been advised to call insurance company to determine out of pocket expense if they have not yet received this vaccine. Advised may also receive vaccine at local pharmacy or Health Dept. Verbalized acceptance and understanding.  Screening Tests Health Maintenance  Topic Date Due  . COVID-19 Vaccine (1) Never done  . DEXA SCAN  03/10/2015  . INFLUENZA VACCINE  06/04/2020  . TETANUS/TDAP  12/01/2023   . PNA vac Low Risk Adult  Completed    Health Maintenance  Health Maintenance Due  Topic Date Due  . COVID-19 Vaccine (1) Never done  . DEXA SCAN  03/10/2015  . INFLUENZA VACCINE  06/04/2020    Colorectal cancer screening: No longer required.  Mammogram status: No longer required.  Bone Density status: Completed 03/09/13. Results reflect: Bone density results: OSTEOPOROSIS. Repeat every 2 years. Currently due, declined a referral at this time.  Lung Cancer Screening: (Low Dose CT Chest recommended if Age 60-80 years, 30 pack-year currently smoking OR have quit w/in 15years.) does not qualify.    Additional Screening:  Vision Screening: Recommended annual ophthalmology exams for early detection of glaucoma and other disorders of the eye. Is the patient up to date with their annual eye exam? No Who is the provider or what is the name of the office in which the patient attends annual eye exams? N/A If pt is not established with a provider, would they like to be referred to a provider to establish care? No daughter declined.  Dental Screening: Recommended annual dental exams for proper oral hygiene  Community Resource Referral / Chronic Care Management: CRR required this visit?  No   CCM required this visit?  No      Plan:     I have personally reviewed and noted the following in the patient's chart:   . Medical and social history . Use of alcohol, tobacco or illicit drugs  . Current medications and supplements . Functional ability and status . Nutritional status . Physical activity . Advanced directives . List of other physicians . Hospitalizations, surgeries, and ER visits in previous 12 months . Vitals . Screenings to include cognitive,  depression, and falls . Referrals and appointments  In addition, I have reviewed and discussed with patient certain preventive protocols, quality metrics, and best practice recommendations. A written personalized care plan for  preventive services as well as general preventive health recommendations were provided to patient.     Watt Geiler [place], [place]   12/09/971   Nurse Notes: Currently due for a DEXA scan- daughter declined order today and would like to speak with pt first. Advised daughter to bring COVID vaccine card to next in office apt.

## 2020-06-07 ENCOUNTER — Other Ambulatory Visit: Payer: Self-pay | Admitting: Family Medicine

## 2020-06-07 ENCOUNTER — Other Ambulatory Visit: Payer: Self-pay

## 2020-06-07 ENCOUNTER — Ambulatory Visit (INDEPENDENT_AMBULATORY_CARE_PROVIDER_SITE_OTHER): Payer: Medicare Other

## 2020-06-07 DIAGNOSIS — Z0000 Encounter for general adult medical examination without abnormal findings: Secondary | ICD-10-CM

## 2020-06-07 NOTE — Patient Instructions (Signed)
Ms. Joyce [name] , Thank you for taking time to come for your Medicare Wellness Visit. I appreciate your ongoing commitment to your health goals. Please review the following plan we discussed and let me know if I can assist you in the future.   Screening recommendations/referrals: Colonoscopy: No longer required.  Mammogram: No longer required.  Bone Density: Currently due, declined order today. Recommended yearly ophthalmology/optometry visit for glaucoma screening and checkup Recommended yearly dental visit for hygiene and checkup  Vaccinations: Influenza vaccine: Due to fall 2021 Pneumococcal vaccine: Completed series Tdap vaccine: Up to date, due 11/2023 Shingles vaccine: Shingrix discussed. Please contact your pharmacy for coverage information.     Advanced directives: Currently on file.  Conditions/risks identified: Continue to increase water intake to 6-8 8 oz glasses a day.  Next appointment: 09/20/20 @ 10:00 AM with Dr Rosanna Randy. Declined scheduling an AWV for 2022 at this time.   Preventive Care 60 Years and Older, Female Preventive care refers to lifestyle choices and visits with your health care provider that can promote health and wellness. What does preventive care include?  A yearly physical exam. This is also called an annual well check.  Dental exams once or twice a year.  Routine eye exams. Ask your health care provider how often you should have your eyes checked.  Personal lifestyle choices, including:  Daily care of your teeth and gums.  Regular physical activity.  Eating a healthy diet.  Avoiding tobacco and drug use.  Limiting alcohol use.  Practicing safe sex.  Taking low-dose aspirin every day.  Taking vitamin and mineral supplements as recommended by your health care provider. What happens during an annual well check? The services and screenings done by your health care provider during your annual well check will depend on your age, overall health,  lifestyle risk factors, and family history of disease. Counseling  Your health care provider may ask you questions about your:  Alcohol use.  Tobacco use.  Drug use.  Emotional well-being.  Home and relationship well-being.  Sexual activity.  Eating habits.  History of falls.  Memory and ability to understand (cognition).  Work and work Statistician.  Reproductive health. Screening  You may have the following tests or measurements:  Height, weight, and BMI.  Blood pressure.  Lipid and cholesterol levels. These may be checked every 5 years, or more frequently if you are over 41 years old.  Skin check.  Lung cancer screening. You may have this screening every year starting at age 15 if you have a 30-pack-year history of smoking and currently smoke or have quit within the past 15 years.  Fecal occult blood test (FOBT) of the stool. You may have this test every year starting at age 13.  Flexible sigmoidoscopy or colonoscopy. You may have a sigmoidoscopy every 5 years or a colonoscopy every 10 years starting at age 47.  Hepatitis C blood test.  Hepatitis B blood test.  Sexually transmitted disease (STD) testing.  Diabetes screening. This is done by checking your blood sugar (glucose) after you have not eaten for a while (fasting). You may have this done every 1-3 years.  Bone density scan. This is done to screen for osteoporosis. You may have this done starting at age 37.  Mammogram. This may be done every 1-2 years. Talk to your health care provider about how often you should have regular mammograms. Talk with your health care provider about your test results, treatment options, and if necessary, the need for more tests.  Vaccines  Your health care provider may recommend certain vaccines, such as:  Influenza vaccine. This is recommended every year.  Tetanus, diphtheria, and acellular pertussis (Tdap, Td) vaccine. You may need a Td booster every 10 years.  Zoster  vaccine. You may need this after age 24.  Pneumococcal 13-valent conjugate (PCV13) vaccine. One dose is recommended after age 47.  Pneumococcal polysaccharide (PPSV23) vaccine. One dose is recommended after age 37. Talk to your health care provider about which screenings and vaccines you need and how often you need them. This information is not intended to replace advice given to you by your health care provider. Make sure you discuss any questions you have with your health care provider. Document Released: 11/17/2015 Document Revised: 07/10/2016 Document Reviewed: 08/22/2015 Elsevier Interactive Patient Education  2017 [place] Prevention in the Home Falls can cause injuries. They can happen to people of all ages. There are many things you can do to make your home safe and to help prevent falls. What can I do on the outside of my home?  Regularly fix the edges of walkways and driveways and fix any cracks.  Remove anything that might make you trip as you walk through a door, such as a raised step or threshold.  Trim any bushes or trees on the path to your home.  Use bright outdoor lighting.  Clear any walking paths of anything that might make someone trip, such as rocks or tools.  Regularly check to see if handrails are loose or broken. Make sure that both sides of any steps have handrails.  Any raised decks and porches should have guardrails on the edges.  Have any leaves, snow, or ice cleared regularly.  Use sand or salt on walking paths during winter.  Clean up any spills in your garage right away. This includes oil or grease spills. What can I do in the bathroom?  Use night lights.  Install grab bars by the toilet and in the tub and shower. Do not use towel bars as grab bars.  Use non-skid mats or decals in the tub or shower.  If you need to sit down in the shower, use a plastic, non-slip stool.  Keep the floor dry. Clean up any water that spills on the  floor as soon as it happens.  Remove soap buildup in the tub or shower regularly.  Attach bath mats securely with double-sided non-slip rug tape.  Do not have throw rugs and other things on the floor that can make you trip. What can I do in the bedroom?  Use night lights.  Make sure that you have a light by your bed that is easy to reach.  Do not use any sheets or blankets that are too big for your bed. They should not hang down onto the floor.  Have a firm chair that has side arms. You can use this for support while you get dressed.  Do not have throw rugs and other things on the floor that can make you trip. What can I do in the kitchen?  Clean up any spills right away.  Avoid walking on wet floors.  Keep items that you use a lot in easy-to-reach places.  If you need to reach something above you, use a strong step stool that has a grab bar.  Keep electrical cords out of the way.  Do not use floor polish or wax that makes floors slippery. If you must use wax, use non-skid floor wax.  Do not have throw rugs and other things on the floor that can make you trip. What can I do with my stairs?  Do not leave any items on the stairs.  Make sure that there are handrails on both sides of the stairs and use them. Fix handrails that are broken or loose. Make sure that handrails are as long as the stairways.  Check any carpeting to make sure that it is firmly attached to the stairs. Fix any carpet that is loose or worn.  Avoid having throw rugs at the top or bottom of the stairs. If you do have throw rugs, attach them to the floor with carpet tape.  Make sure that you have a light switch at the top of the stairs and the bottom of the stairs. If you do not have them, ask someone to add them for you. What else can I do to help prevent falls?  Wear shoes that:  Do not have high heels.  Have rubber bottoms.  Are comfortable and fit you well.  Are closed at the toe. Do not wear  sandals.  If you use a stepladder:  Make sure that it is fully opened. Do not climb a closed stepladder.  Make sure that both sides of the stepladder are locked into place.  Ask someone to hold it for you, if possible.  Clearly mark and make sure that you can see:  Any grab bars or handrails.  First and last steps.  Where the edge of each step is.  Use tools that help you move around (mobility aids) if they are needed. These include:  Canes.  Walkers.  Scooters.  Crutches.  Turn on the lights when you go into a dark area. Replace any light bulbs as soon as they burn out.  Set up your furniture so you have a clear path. Avoid moving your furniture around.  If any of your floors are uneven, fix them.  If there are any pets around you, be aware of where they are.  Review your medicines with your doctor. Some medicines can make you feel dizzy. This can increase your chance of falling. Ask your doctor what other things that you can do to help prevent falls. This information is not intended to replace advice given to you by your health care provider. Make sure you discuss any questions you have with your health care provider. Document Released: 08/17/2009 Document Revised: 03/28/2016 Document Reviewed: 11/25/2014 Elsevier Interactive Patient Education  2017 [employer].

## 2020-06-16 ENCOUNTER — Telehealth: Payer: Self-pay

## 2020-06-26 ENCOUNTER — Ambulatory Visit: Payer: Medicare Other | Admitting: Physician Assistant

## 2020-06-27 ENCOUNTER — Other Ambulatory Visit: Payer: Self-pay

## 2020-06-27 ENCOUNTER — Encounter: Payer: Medicare Other | Attending: Physician Assistant | Admitting: Physician Assistant

## 2020-06-27 DIAGNOSIS — I872 Venous insufficiency (chronic) (peripheral): Secondary | ICD-10-CM | POA: Diagnosis not present

## 2020-06-27 DIAGNOSIS — I10 Essential (primary) hypertension: Secondary | ICD-10-CM | POA: Diagnosis not present

## 2020-06-27 DIAGNOSIS — L97812 Non-pressure chronic ulcer of other part of right lower leg with fat layer exposed: Secondary | ICD-10-CM | POA: Diagnosis not present

## 2020-06-27 DIAGNOSIS — L89159 Pressure ulcer of sacral region, unspecified stage: Secondary | ICD-10-CM | POA: Diagnosis present

## 2020-06-27 DIAGNOSIS — L89153 Pressure ulcer of sacral region, stage 3: Secondary | ICD-10-CM | POA: Insufficient documentation

## 2020-06-27 DIAGNOSIS — S81801A Unspecified open wound, right lower leg, initial encounter: Secondary | ICD-10-CM | POA: Diagnosis not present

## 2020-06-27 DIAGNOSIS — I890 Lymphedema, not elsewhere classified: Secondary | ICD-10-CM | POA: Diagnosis not present

## 2020-06-27 DIAGNOSIS — J449 Chronic obstructive pulmonary disease, unspecified: Secondary | ICD-10-CM | POA: Diagnosis not present

## 2020-06-28 NOTE — Progress Notes (Signed)
TEREN, FRANCKOWIAK ([id number]) Visit Report for 06/27/2020 Allergy List Details Patient Name: Joyce [name], [name] Date of Service: 06/27/2020 12:45 PM Medical Record Number: [id number] Patient Account Number: [id number] Date of Birth/Sex: [birth year] (86 y.o. F) Treating RN: Grover Canavan Primary Care Kyesha Balla: [name], [name] Other Clinician: Referring Davidmichael Zarazua: [name], [name] Treating Mandy Peeks/Extender: Melburn Hake, HOYT Weeks in Treatment: 0 Allergies Active Allergies No Known Allergies Allergy Notes Electronic Signature(s) Signed: 06/27/2020 5:37:30 PM By: Grover Canavan Entered By: Grover Canavan on 06/27/2020 12:55:05 Joyce [name], [name] ([id number]) -------------------------------------------------------------------------------- [place] Information Details Patient Name: [name], [name] Date of Service: 06/27/2020 12:45 PM Medical Record Number: [id number] Patient Account Number: [id number] Date of Birth/Sex: 18-Aug-1928 (86 y.o. F) Treating RN: Grover Canavan Primary Care Vahan Wadsworth: [name], [name] Other Clinician: Referring Maylen Waltermire: [name], [name] Treating Giancarlo Askren/Extender: Sharalyn Ink in Treatment: 0 Visit Information Patient Arrived: Walker Arrival Time: 12:53 Accompanied By: daughter Transfer Assistance: None Patient Identification Verified: Yes Secondary Verification Process Completed: Yes History Since Last Visit Added or deleted any medications: No Had a fall or experienced change in activities of daily living that may affect risk of falls: No Hospitalized since last visit: No Electronic Signature(s) Signed: 06/27/2020 5:37:30 PM By: Grover Canavan Entered By: Grover Canavan on 06/27/2020 12:54:10 Joyce [name], [name] ([id number]) -------------------------------------------------------------------------------- Clinic Level of Care Assessment Details Patient Name: [name], [name] Date of Service: 06/27/2020 12:45 PM Medical Record Number:  [id number] Patient Account Number: [id number] Date of Birth/Sex: 07-Nov-1927 (86 y.o. F) Treating RN: Grover Canavan Primary Care Kellen Hover: [name], [name] Other Clinician: Referring Albaro Deviney: [name], RICHARD Treating Dyquan Minks/Extender: Melburn Hake, HOYT Weeks in Treatment: 0 Clinic Level of Care Assessment Items TOOL 1 Quantity Score []  - Use when EandM and Procedure is performed on INITIAL visit 0 ASSESSMENTS - Nursing Assessment / Reassessment X - General Physical Exam (combine w/ comprehensive assessment (listed just below) when performed on new 1 20 pt. evals) X- 1 25 Comprehensive Assessment (HX, ROS, Risk Assessments, Wounds Hx, etc.) ASSESSMENTS - Wound and Skin Assessment / Reassessment []  - Dermatologic / Skin Assessment (not related to wound area) 0 ASSESSMENTS - Ostomy and/or Continence Assessment and Care []  - Incontinence Assessment and Management 0 []  - 0 Ostomy Care Assessment and Management (repouching, etc.) PROCESS - Coordination of Care X - Simple Patient / Family Education for ongoing care 1 15 []  - 0 Complex (extensive) Patient / Family Education for ongoing care X- 1 10 Staff obtains Programmer, systems, Records, Test Results / Process Orders []  - 0 Staff telephones HHA, Nursing Homes / Clarify orders / etc []  - 0 Routine Transfer to another Facility (non-emergent condition) []  - 0 Routine Hospital Admission (non-emergent condition) []  - 0 New Admissions / Biomedical engineer / Ordering NPWT, Apligraf, etc. []  - 0 Emergency Hospital Admission (emergent condition) PROCESS - Special Needs []  - Pediatric / Minor Patient Management 0 []  - 0 Isolation Patient Management []  - 0 Hearing / Language / Visual special needs []  - 0 Assessment of Community assistance (transportation, D/C planning, etc.) []  - 0 Additional assistance / Altered mentation []  - 0 Support Surface(s) Assessment (bed, cushion, seat, etc.) INTERVENTIONS - Miscellaneous []  - External  ear exam 0 []  - 0 Patient Transfer (multiple staff / Civil Service fast streamer / Similar devices) []  - 0 Simple Staple / Suture removal (25 or less) []  - 0 Complex Staple / Suture removal (26 or more) []  - 0 Hypo/Hyperglycemic Management (do not check if billed separately) X- 1 15 Ankle /  Brachial Index (ABI) - do not check if billed separately Has the patient been seen at the hospital within the last three years: Yes Total Score: 85 Level Of Care: New/Established - Level 3 Joyce [name], [name] ([id number]) Electronic Signature(s) Signed: 06/27/2020 5:37:30 PM By: Grover Canavan Entered By: Grover Canavan on 06/27/2020 14:00:57 Joyce [name], [name] ([id number]) -------------------------------------------------------------------------------- Compression Therapy Details Patient Name: Joyce [name], [name] Date of Service: 06/27/2020 12:45 PM Medical Record Number: [id number] Patient Account Number: [id number] Date of Birth/Sex: 1927-12-28 (86 y.o. F) Treating RN: Grover Canavan Primary Care Cintya Daughety: [name], [name] Other Clinician: Referring Samirah Scarpati: [name], [name] Treating Krystyna Cleckley/Extender: Melburn Hake, HOYT Weeks in Treatment: 0 Compression Therapy Performed for Wound Assessment: NonWound Condition Lymphedema - Left Leg Performed By: Clinician Grover Canavan, RN Compression Type: Three Layer Pre Treatment ABI: 1.2 Post Procedure Diagnosis Same as Pre-procedure Electronic Signature(s) Signed: 06/27/2020 5:37:30 PM By: Grover Canavan Entered By: Grover Canavan on 06/27/2020 13:49:12 Joyce [name], [name] ([id number]) -------------------------------------------------------------------------------- Compression Therapy Details Patient Name: Joyce [name], [name] Date of Service: 06/27/2020 12:45 PM Medical Record Number: [id number] Patient Account Number: [id number] Date of Birth/Sex: [birth year] (86 y.o. F) Treating RN: Grover Canavan Primary Care Cara Thaxton: [name], [name] Other  Clinician: Referring Omer Monter: [name], [name] Treating Tsutomu Barfoot/Extender: Melburn Hake, HOYT Weeks in Treatment: 0 Compression Therapy Performed for Wound Assessment: Wound #5 Right,Medial Lower Leg Performed By: Clinician Grover Canavan, RN Compression Type: Three Layer Pre Treatment ABI: 1.4 Post Procedure Diagnosis Same as Pre-procedure Electronic Signature(s) Signed: 06/27/2020 5:37:30 PM By: Grover Canavan Entered By: Grover Canavan on 06/27/2020 13:49:34 Joyce [name], [name] ([id number]) -------------------------------------------------------------------------------- Encounter Discharge Information Details Patient Name: Joyce [name], [name] Date of Service: 06/27/2020 12:45 PM Medical Record Number: [id number] Patient Account Number: [id number] Date of Birth/Sex: [birth year] (86 y.o. F) Treating RN: Grover Canavan Primary Care Mordecai Tindol: [name], [name] Other Clinician: Referring Canyon Willow: [name], [name] Treating Thanvi Blincoe/Extender: Sharalyn Ink in Treatment: 0 Encounter Discharge Information Items Discharge Condition: Stable Ambulatory Status: Walker Discharge Destination: Home Transportation: Private Auto Accompanied By: daughter Schedule Follow-up Appointment: Yes Clinical Summary of Care: Electronic Signature(s) Signed: 06/27/2020 5:37:30 PM By: Grover Canavan Entered By: Grover Canavan on 06/27/2020 14:02:19 Sheu, Silvana L. ([id number]) -------------------------------------------------------------------------------- Lower Extremity Assessment Details Patient Name: Joyce [name], [name] Date of Service: 06/27/2020 12:45 PM Medical Record Number: [id number] Patient Account Number: [id number] Date of Birth/Sex: 12-28-1927 (86 y.o. F) Treating RN: Grover Canavan Primary Care Diva Lemberger: [name], [name] Other Clinician: Referring Wiley Magan: [name], RICHARD Treating Quinteria Chisum/Extender: Melburn Hake, HOYT Weeks in Treatment: 0 Edema Assessment Assessed:  [Left: Yes] [Right: Yes] Edema: [Left: Yes] [Right: Yes] Calf Left: Right: Point of Measurement: 22 cm From Medial Instep 36 cm 34 cm Ankle Left: Right: Point of Measurement: 12 cm From Medial Instep 28 cm 24.5 cm Vascular Assessment Pulses: Dorsalis Pedis Palpable: [Left:No] [Right:No] Doppler Audible: [Left:Yes] [Right:Yes] Posterior Tibial Palpable: [Left:No] [Right:No] Doppler Audible: [Left:Yes] [Right:Yes] Blood Pressure: Brachial: [Left:119] Ankle: [Left:Dorsalis Pedis: 152 1.28] [Right:Dorsalis Pedis: 162 1.36] Electronic Signature(s) Signed: 06/27/2020 5:37:30 PM By: Grover Canavan Entered By: Grover Canavan on 06/27/2020 13:28:55 Joyce [name], [name] ([id number]) -------------------------------------------------------------------------------- Multi Wound Chart Details Patient Name: Joyce [name], [name] Date of Service: 06/27/2020 12:45 PM Medical Record Number: [id number] Patient Account Number: [id number] Date of Birth/Sex: [birth year] (86 y.o. F) Treating RN: Grover Canavan Primary Care Mubarak Bevens: [name], [name] Other Clinician: Referring Jacy Howat: [name], [name] Treating Faigy Stretch/Extender: Melburn Hake, HOYT Weeks in Treatment: 0 Vital Signs Height(in): Pulse(bpm): 85 Weight(lbs): Blood Pressure(mmHg): 119/61 Body Mass  Index(BMI): Temperature(F): 98.6 Respiratory Rate(breaths/min): 16 Photos: [N/A:N/A] Wound Location: Medial Sacrum Right, Medial Lower Leg N/A Wounding Event: Gradually Appeared Gradually Appeared N/A Primary Etiology: Pressure Ulcer Venous Leg Ulcer N/A Comorbid History: Chronic Obstructive Pulmonary Chronic Obstructive Pulmonary N/A Disease (COPD), History of pressure Disease (COPD), History of pressure wounds, Osteoarthritis wounds, Osteoarthritis Date Acquired: 09/21/2018 06/24/2020 N/A Weeks of Treatment: 0 0 N/A Wound Status: Open Open N/A Measurements L x W x D (cm) 2x1x1 1.5x1x0.1 N/A Area (cm) : 1.571 1.178 N/A Volume (cm)  : 1.571 0.118 N/A % Reduction in Area: 0.00% 0.00% N/A % Reduction in Volume: 0.00% 0.00% N/A Starting Position 1 (o'clock): 4 Ending Position 1 (o'clock): 12 Maximum Distance 1 (cm): 1.5 Undermining: Yes No N/A Classification: Category/Stage II Partial Thickness N/A Exudate Amount: Medium Medium N/A Exudate Type: Serosanguineous Serosanguineous N/A Exudate Color: red, brown red, brown N/A Wound Margin: Flat and Intact Distinct, outline attached N/A Granulation Amount: Medium (34-66%) Small (1-33%) N/A Granulation Quality: Red Red N/A Necrotic Amount: None Present (0%) Medium (34-66%) N/A Exposed Structures: Fat Layer (Subcutaneous Tissue): N/A N/A Yes Fascia: No Tendon: No Muscle: No Joint: No Bone: No Epithelialization: None None N/A Treatment Notes Electronic Signature(s) Signed: 06/27/2020 5:37:30 PM By: Marty Heck, Alean Carlean Jews ([id number]) Entered By: Grover Canavan on 06/27/2020 13:43:28 Joyce [name], Kimbella L. ([id number]) -------------------------------------------------------------------------------- [place] Details Patient Name: Lua, Zorah L. Date of Service: 06/27/2020 12:45 PM Medical Record Number: [id number] Patient Account Number: [id number] Date of Birth/Sex: Jul 21, 1928 (86 y.o. F) Treating RN: Cornell Barman Primary Care Hortensia Duffin: [name], [name] Other Clinician: Referring Temprance Wyre: [name], [name] Treating Mylei Brackeen/Extender: Melburn Hake, HOYT Weeks in Treatment: 0 Active Inactive Necrotic Tissue Nursing Diagnoses: Impaired tissue integrity related to necrotic/devitalized tissue Goals: Necrotic/devitalized tissue will be minimized in the wound bed Date Initiated: 06/27/2020 Target Resolution Date: 07/04/2020 Goal Status: Active Interventions: Assess patient pain level pre-, during and post procedure and prior to discharge Treatment Activities: Apply topical anesthetic as ordered : 06/27/2020 Notes: Orientation to the Wound  Care Program Nursing Diagnoses: Knowledge deficit related to the wound healing center program Goals: Patient/caregiver will verbalize understanding of the [place] Date Initiated: 06/27/2020 Target Resolution Date: 06/27/2020 Goal Status: Active Interventions: Provide education on orientation to the wound center Notes: Pressure Nursing Diagnoses: Knowledge deficit related to management of pressures ulcers Goals: Patient will remain free from development of additional pressure ulcers Date Initiated: 06/27/2020 Target Resolution Date: 07/04/2020 Goal Status: Active Patient/caregiver will verbalize understanding of pressure ulcer management Date Initiated: 06/27/2020 Target Resolution Date: 07/04/2020 Goal Status: Active Interventions: Assess: immobility, friction, shearing, incontinence upon admission and as needed Notes: Venous Leg Ulcer Nursing Diagnoses: Actual venous Insuffiency (use after diagnosis is confirmed) Grenda, Tamaya L. ([id number]) Goals: Patient will maintain optimal edema control Date Initiated: 06/27/2020 Target Resolution Date: 07/04/2020 Goal Status: Active Interventions: Assess peripheral edema status every visit. Treatment Activities: Therapeutic compression applied : 06/27/2020 Notes: Wound/Skin Impairment Nursing Diagnoses: Impaired tissue integrity Goals: Ulcer/skin breakdown will have a volume reduction of 30% by week 4 Date Initiated: 06/27/2020 Target Resolution Date: 07/29/2011 Goal Status: Active Ulcer/skin breakdown will have a volume reduction of 50% by week 8 Date Initiated: 06/27/2020 Target Resolution Date: 08/27/2020 Goal Status: Active Interventions: Provide education on ulcer and skin care Treatment Activities: Skin care regimen initiated : 06/27/2020 Notes: Electronic Signature(s) Signed: 06/27/2020 5:37:30 PM By: Grover Canavan Signed: 06/27/2020 7:43:28 PM By: Gretta Cool, BSN, RN, CWS, Kim RN, BSN Previous Signature:  06/27/2020 1:42:09 PM Version By:  Gretta Cool, BSN, RN, CWS, Kim RN, BSN Entered By: Grover Canavan on 06/27/2020 13:43:10 Armistead, Sharmin L. ([id number]) -------------------------------------------------------------------------------- Non-Wound Condition Assessment Details Patient Name: Polgar, Ayako L. Date of Service: 06/27/2020 12:45 PM Medical Record Number: [id number] Patient Account Number: [id number] Date of Birth/Sex: November 03, 1928 (86 y.o. F) Treating RN: Grover Canavan Primary Care Rashad Auld: [name], [name] Other Clinician: Referring Jahsiah Carpenter: [name], RICHARD Treating Sloan Takagi/Extender: Melburn Hake, HOYT Weeks in Treatment: 0 Non-Wound Condition: Condition: Lymphedema Location: Leg Side: Left Photos Electronic Signature(s) Signed: 06/27/2020 5:37:30 PM By: Grover Canavan Entered By: Grover Canavan on 06/27/2020 13:29:54 Knutson, Sheilla L. ([id number]) -------------------------------------------------------------------------------- Pain Assessment Details Patient Name: Ozimek, Meron L. Date of Service: 06/27/2020 12:45 PM Medical Record Number: [id number] Patient Account Number: [id number] Date of Birth/Sex: January 23, 1928 (86 y.o. F) Treating RN: Grover Canavan Primary Care Doralee Kocak: [name], [name] Other Clinician: Referring Kellan Raffield: [name], [name] Treating Preslie Depasquale/Extender: Melburn Hake, HOYT Weeks in Treatment: 0 Active Problems Location of Pain Severity and Description of Pain Patient Has Paino No Site Locations Pain Management and Medication Current Pain Management: Electronic Signature(s) Signed: 06/27/2020 5:37:30 PM By: Grover Canavan Entered By: Grover Canavan on 06/27/2020 12:54:15 Xia, Sarra L. ([id number]) -------------------------------------------------------------------------------- Patient/Caregiver Education Details Patient Name: Laurel, Trudee L. Date of Service: 06/27/2020 12:45 PM Medical Record Number: [id number] Patient Account Number:  [id number] Date of Birth/Gender: 1928-03-25 (86 y.o. F) Treating RN: Cornell Barman Primary Care Physician: [name], [name] Other Clinician: Referring Physician: [name] Treating Physician/Extender: Sharalyn Ink in Treatment: 0 Education Assessment Education Provided To: Patient Education Topics Provided Pressure: Handouts: Pressure Ulcers: Care and Offloading Methods: Demonstration Responses: State content correctly Welcome To The [place]: Handouts: Welcome To The [place] Methods: Demonstration Responses: State content correctly Wound/Skin Impairment: Handouts: Caring for Your Ulcer Methods: Explain/Verbal Responses: State content correctly Electronic Signature(s) Signed: 06/27/2020 7:43:28 PM By: Gretta Cool, BSN, RN, CWS, Kim RN, BSN Entered By: Gretta Cool, BSN, RN, CWS, Kim on 06/27/2020 18:45:40 [place], Irven [place] ([id number]) -------------------------------------------------------------------------------- Wound Assessment Details Patient Name: Limbach, Sheray L. Date of Service: 06/27/2020 12:45 PM Medical Record Number: [id number] Patient Account Number: [id number] Date of Birth/Sex: [birth year] (86 y.o. F) Treating RN: Grover Canavan Primary Care Ortha Metts: [name], [name] Other Clinician: Referring Delorse Shane: [name], [name] Treating Sartaj Hoskin/Extender: Melburn Hake, HOYT Weeks in Treatment: 0 Wound Status Wound Number: 4 Primary Pressure Ulcer Etiology: Wound Location: Medial Sacrum Wound Open Wounding Event: Gradually Appeared Status: Date Acquired: 09/21/2018 Comorbid Chronic Obstructive Pulmonary Disease (COPD), History Weeks Of Treatment: 0 History: of pressure wounds, Osteoarthritis Clustered Wound: No Photos Wound Measurements Length: (cm) 2 % Redu Width: (cm) 1 % Redu Depth: (cm) 1 Epithe Area: (cm) 1.571 Tunne Volume: (cm) 1.571 Under Sta End Max ction in Area: 0% ction in Volume: 0% lialization: None ling:  No mining: Yes rting Position (o'clock): 12 ing Position (o'clock): 2 imum Distance: (cm) 1.5 Wound Description Classification: Category/Stage III Foul O Wound Margin: Flat and Intact Slough Exudate Amount: Medium Exudate Type: Serosanguineous Exudate Color: red, brown dor After Cleansing: No /Fibrino No Wound Bed Granulation Amount: Large (67-100%) Exposed Structure Granulation Quality: Red Fascia Exposed: No Necrotic Amount: None Present (0%) Fat Layer (Subcutaneous Tissue) Exposed: Yes Tendon Exposed: No Muscle Exposed: No Joint Exposed: No Bone Exposed: No Treatment Notes Wound #4 (Medial Sacrum) Leavens, Angila L. ([id number]) Notes SIlver cell, abd, 3 layer wrap bilateral (unna to anchor) Electronic Signature(s) Signed: 06/27/2020 5:37:30 PM By: Grover Canavan Entered By: Grover Canavan on 06/27/2020 13:56:10 Alcoser, Janijah  L. ([id number]) -------------------------------------------------------------------------------- Wound Assessment Details Patient Name: Burich, Leyana L. Date of Service: 06/27/2020 12:45 PM Medical Record Number: [id number] Patient Account Number: [id number] Date of Birth/Sex: September 13, 1928 (86 y.o. F) Treating RN: Grover Canavan Primary Care Raeanne Deschler: [name], [name] Other Clinician: Referring Frederika Hukill: [name], [name] Treating Colman Birdwell/Extender: Melburn Hake, HOYT Weeks in Treatment: 0 Wound Status Wound Number: 5 Primary Venous Leg Ulcer Etiology: Wound Location: Right, Medial Lower Leg Wound Open Wounding Event: Gradually Appeared Status: Date Acquired: 06/24/2020 Comorbid Chronic Obstructive Pulmonary Disease (COPD), History Weeks Of Treatment: 0 History: of pressure wounds, Osteoarthritis Clustered Wound: No Photos Wound Measurements Length: (cm) 1.5 Width: (cm) 1 Depth: (cm) 0.1 Area: (cm) 1.178 Volume: (cm) 0.118 % Reduction in Area: 0% % Reduction in Volume: 0% Epithelialization: None Tunneling: No Undermining:  No Wound Description Classification: Partial Thickness Wound Margin: Distinct, outline attached Exudate Amount: Medium Exudate Type: Serosanguineous Exudate Color: red, brown Foul Odor After Cleansing: No Slough/Fibrino No Wound Bed Granulation Amount: Small (1-33%) Granulation Quality: Red Necrotic Amount: Medium (34-66%) Necrotic Quality: Adherent Slough Treatment Notes Wound #5 (Right, Medial Lower Leg) Notes SIlver cell, abd, 3 layer wrap bilateral (unna to anchor) Electronic Signature(s) Signed: 06/27/2020 5:37:30 PM By: Grover Canavan Entered By: Grover Canavan on 06/27/2020 13:21:34 Clites, Hartleigh L. ([id number]) Blair, [place] ([id number]) -------------------------------------------------------------------------------- Vitals Details Patient Name: Vanderheiden, Dorotha L. Date of Service: 06/27/2020 12:45 PM Medical Record Number: [id number] Patient Account Number: [id number] Date of Birth/Sex: [birth year] (86 y.o. F) Treating RN: Grover Canavan Primary Care Fawne Hughley: [name], [name] Other Clinician: Referring Yerik Zeringue: [name], [name] Treating Johanna Stafford/Extender: Melburn Hake, HOYT Weeks in Treatment: 0 Vital Signs Time Taken: 12:54 Temperature (F): 98.6 Pulse (bpm): 85 Respiratory Rate (breaths/min): 16 Blood Pressure (mmHg): 119/61 Reference Range: 80 - 120 mg / dl Electronic Signature(s) Signed: 06/27/2020 5:37:30 PM By: Grover Canavan Entered By: Grover Canavan on 06/27/2020 12:54:44

## 2020-06-28 NOTE — Progress Notes (Signed)
SORAIYA, AHNER ([id number]) Visit Report for 06/27/2020 Chief Complaint Document Details Patient Name: Joyce [name], [name] Date of Service: 06/27/2020 12:45 PM Medical Record Number: [id number] Patient Account Number: [id number] Date of Birth/Sex: Nov 04, 1928 (86 y.o. F) Treating RN: Cornell Barman Primary Care Provider: [name], [name] Other Clinician: Referring Provider: [name] Treating Provider/Extender: Melburn Hake, Jonte Wollam Weeks in Treatment: 0 Information Obtained from: Patient Chief Complaint Sacral pressure ulcer and right leg ulcer Electronic Signature(s) Signed: 06/27/2020 1:41:17 PM By: [name] PA-C Entered By: [name] on 06/27/2020 13:41:16 [place], [place] ([id number]) -------------------------------------------------------------------------------- HPI Details Patient Name: Joyce [name], [name] Date of Service: 06/27/2020 12:45 PM Medical Record Number: [id number] Patient Account Number: [id number] Date of Birth/Sex: 06-Jan-1928 (86 y.o. F) Treating RN: Cornell Barman Primary Care Provider: [name], [name] Other Clinician: Referring Provider: [name], [name] Treating Provider/Extender: [name] Weeks in Treatment: 0 History of Present Illness HPI Description: 05/14/19 on evaluation today patient appears to be doing rather well in regard to her lower extremities compared to what it sounds like she was doing not that far back. Fortunately there's no signs of active infection at this time although it does sound like she had significant cellulitis earlier in the year. She does also have a issue with a sacral ulcer upon inspection today which has been present since around February 2020 this is about as long as she's been dealing with her legs as well. She is under palliative care but not hospice. The patient does have a history of scoliosis and kyphosis which makes it difficult for her to lay flat she actually sleep sitting up in a recliner but night and  reclined at this cause her trouble breathing. She also has COPD, hypertension, and bilateral lower should be lymphedema and venous stasis. Currently there's no signs of active cellulitis of the bilateral lower extremities which is good news. The patient has not had any vascular intervention nor evaluation that we have on file. She does spend the majority per day apparently sitting in her recliner upright. This I think is an issue both for her legs as well as for the sacral region unfortunately. She could not tolerate the ABI's today due to the fact that it calls too much pain when we were performing the blood pressure reading on her lower extremities who were not even able to get a proper reading. No fevers, chills, nausea, or vomiting noted at this time. 05/25/19 on evaluation today patient appears to be doing about the same in regard to her sacral wound maybe slightly better compared to previous. Fortunately there's no signs of active infection at this time which is good news in regard to her lower extremities. She has previously had cellulitis therefore were trying to keep an eye on this as far as that's concerned. Nonetheless since I last saw her which was actually roughly 2 weeks ago since due to work her daughter was unable to bring her last week she is not had any care through home health and has not gotten compression stockings for her bilateral lower extremities. Subsequently the issue with home health seems to be multifactorial when they contacted the patient and her daughter in order to get things scheduled they were told according to the daughter that she didn't know why they needed to come out three times a week and wasn't aware that that was what the order was gonna be. The daughter tells me that that somewhat poor timing for her as she works  and doesn't have a lot of time to hang around at the house waiting for home health. With that being said I explained that the only thing we can  really do is either have home health come out and perform the dressing changes or else potentially have a family member do this. There apparently is no family member including herself who is willing to do this she tells me "that she doesn't feel qualified and comfortable". Nonetheless either way the patient has not had a dressing on the sacral area since the last time she was here she uses a pad hasn't really noted much drainage onto the pad although it does appear that the wound itself trapped a lot of the fluid within skin folds. In the end I do think that since she doesn't really have anyone to perform the dressing changes at home we are going to need to utilize the services of home health. They are in agreement with that plan. 06/01/19 on evaluation today patient presents for follow-up concerning her wound in the midline sacral area. This seems to be doing well there is some evidence of new skin growth there still an open sore however it is gonna require dressing. Home health is coming out to see her I think we do need to see about getting her a better schedule where potentially she comes entire clinic on Monday and then on Wednesday and Friday his home health change in her dressing as it stands right now I feel like this is happening to a regular for her. Fortunately there's no signs of any infection at this time her legs are doing well although she does have the compression stockings finally she doesn't actually have those on as of today. They want [place] to show them how to put them on. 06/08/19 on evaluation today patient came in today without any dressing on her sacral region. Apparently this fell off around Friday and unfortunately it was supposed to be reapplied Friday but the patient had an appointment scheduled for 11 o'clock and her home health nurse was supposed to arrive at 63. She was running late and therefore they called and canceled her coming out to perform the dressing change  and subsequently even though they had dressing material at home her daughter refuses to apply the dressing to the wound. For that reason she went all weekend without any dressing until today. She also has gotten the compression stockings but has just use the Tubigrip on occasion although she doesn't even have that on today. Apparently her daughter stating that the compression stockings are too hard to get on and therefore they're not able to do that. Nonetheless as I stated patient came in with nothing on her lower extremities as far as compression is concerned. They do want to see about extending her visits to every two weeks. 07/02/19 on evaluation today patient presents after apparently having been quarantined for 14 days due to having been exposed to someone with Covid-19 Virus during the time that she is been quarantined she has not actually had her dressing changed it all apparently nobody would come around her including her daughter. Nonetheless her daughter hasn't been changing the dressing even prior they did have a home health nurse coming out but it Gillermina Phy may have been about a week's worth of time that the patient actually had home health change in the dressing and even then there was some issues with timing and them actually coming out an appropriate time that the patient  and her daughter felt was within reason. Nonetheless they feel like they had a much better experience with advanced homecare in the past. 07/09/2019 on evaluation today patient appears to be doing unfortunately somewhat the same in regard to her wound. She really has not had a dressing on since we put one on last week. Then her daughter really is not able to perform the dressing changes the patient tells me that she has a hard time with the location of the wound had been able to perform the dressing change at this site. Nonetheless they also had issues with home health they wanted if we would initiate home health therapy to  have a different company come out. 10/21/2019 on evaluation today patient presents for follow-up concerning the same issue on her midline sacral region. This is the same area that I was taken care of back last in September and that is the last time I saw her. We have never actually healed this out and at that point I did actually recommend that we were using dressings unfortunately the dressings just never worked out for her. She could not keep these in place. For that reason I did actually suggest that we could try some creams but again then she never came back for follow-up but I am not really sure exactly what they have been doing in the interim. She has not really noted any significant bleeding and drainage has been she tells me fairly minimal both of which are good signs. With that being said unfortunately she still has an open wound which I explained is still a risk factor for infection. She also has an area on her second toe of the right foot right at the bend of the toe where this is somewhat hammered and subsequently rubs on her shoe. I think this too is a pressure/friction injury and subsequently it seems to be doing much better she has been on antibiotics both oral as well as topical mupirocin and taken by mouth she is unsure of what the antibiotic was. However she is also been using some antifungal cream around the toes in general. Overall the toe does seem to be doing much better with no obvious signs of infection at this Heidel, [name] ([id number]) point 11/11/2019 on evaluation today patient appears to be doing well overall with regard to her wounds. She has been tolerating the dressing changes without complication. We did recommend however that she use Betadine for her toe area they have been using an antibiotic ointment instead that they had. I explained the rationale for why we use the Betadine in order to try to keep this clean and dry preventing infection and gangrene. Nonetheless  I think that is still probably the best bet for the toe area. It does not appear to be infected right now and really has not softened up enough to be debrided away in my opinion. With regard to her sacral region this is still something that seems to be doing okay in my opinion I think that she is tolerating the zinc applications and while I would prefer to have a dressing on this area we really just have not been able to keep anything in the spot in order to allow it to heal appropriately. For that reason my suggestion is good to be that we go ahead and continue with the zinc I do see some evidence of new epithelization around the edges of the wound. I do believe that she still needs to focus on  trying to offload and her home aide nurse has mentioned the possibility of getting a hospital bed I think a hospital bed with an air mattress would be of benefit for her. Readmission: Upon evaluation today patient presents for readmission I have not seen her since January of this year. With that being said she still continues to have the wound in the sacral region she also has a wound on the lower extremity as well that is good to be something we have to address at this point to. Fortunately there is no signs of active infection at this time which is good news in regard to her lower extremity. There is little bit of erythema around I am not 100% sure this is evidence of cellulitis at this point though it is not out of the question either to be honest. I believe it is likely more inflammatory based on what I am seeing. Fortunately there is no signs of systemic infection at this point. In regard to the sacral wound she still continues to have this and based on what I am seeing initially I think this has gotten significantly worse. Electronic Signature(s) Signed: 06/27/2020 7:04:27 PM By: [name] PA-C Entered By: [name] on 06/27/2020 19:04:27 [place], Joyce [name]  ([id number]) -------------------------------------------------------------------------------- Physical Exam Details Patient Name: Joyce [name], [name] Date of Service: 06/27/2020 12:45 PM Medical Record Number: [id number] Patient Account Number: [id number] Date of Birth/Sex: April 23, 1928 (86 y.o. F) Treating RN: Cornell Barman Primary Care Provider: [name], [name] Other Clinician: Referring Provider: [name], [name] Treating Provider/Extender: Melburn Hake, Nohealani Medinger Weeks in Treatment: 0 Constitutional sitting or standing blood pressure is within target range for patient.. pulse regular and within target range for patient.Marland Kitchen respirations regular, non- labored and within target range for patient.Marland Kitchen temperature within target range for patient.. Well-nourished and well-hydrated in no acute distress. Eyes conjunctiva clear no eyelid edema noted. pupils equal round and reactive to light and accommodation. Ears, Nose, Mouth, and Throat no gross abnormality of ear auricles or external auditory canals. normal hearing noted during conversation. mucus membranes moist. Respiratory normal breathing without difficulty. Cardiovascular 1+ dorsalis pedis/posterior tibialis pulses. 2+ pitting edema of the bilateral lower extremities. Musculoskeletal stooped posture. Psychiatric this patient is able to make decisions and demonstrates good insight into disease process. Alert and Oriented x 3. pleasant and cooperative. Notes Upon inspection patient's wound on lower extremity actually appears to be doing quite well there is some signs of erythema surrounding the wound bed but again I believe this is likely more secondary to just inflammation from the wound not necessarily that this is indicative of an overt infection. With that being said it something I am to keep an eye on we will see how things appear next week when I see her back. With that being said in regard to the sacral wound this is significantly worse at this  point unfortunately. There is actually some undermining as well which does have me concerned that is not something that we saw previous. Obviously I think this is a decline overall in the wound compared to when I last saw her. Electronic Signature(s) Signed: 06/27/2020 7:05:36 PM By: [name] PA-C Entered By: [name] on 06/27/2020 19:05:36 [place], Joyce [name] ([id number]) -------------------------------------------------------------------------------- Physician Orders Details Patient Name: Joyce [name], [name] Date of Service: 06/27/2020 12:45 PM Medical Record Number: [id number] Patient Account Number: [id number] Date of Birth/Sex: [birth year] (86 y.o. F) Treating RN: Grover Canavan Primary Care Provider: [name] Other Clinician: Referring  Provider: [name], [name] Treating Provider/Extender: Melburn Hake, Arnel Wymer Weeks in Treatment: 0 Verbal / Phone Orders: No Diagnosis Coding ICD-10 Coding Code Description L89.153 Pressure ulcer of sacral region, stage 3 I89.0 Lymphedema, not elsewhere classified I87.2 Venous insufficiency (chronic) (peripheral) L97.812 Non-pressure chronic ulcer of other part of right lower leg with fat layer exposed I10 Essential (primary) hypertension J44.9 Chronic obstructive pulmonary disease, unspecified Wound Cleansing Wound #4 Medial Sacrum o Clean wound with Normal Saline. o May shower with protection. - do not get dressing wet Wound #5 Right,Medial Lower Leg o Clean wound with Normal Saline. o May shower with protection. - do not get dressing wet Anesthetic (add to Medication List) Wound #4 Medial Sacrum o Topical Lidocaine 4% cream applied to wound bed prior to debridement (In Clinic Only). Wound #5 Right,Medial Lower Leg o Topical Lidocaine 4% cream applied to wound bed prior to debridement (In Clinic Only). Primary Wound Dressing Wound #4 Medial Sacrum o Silver Alginate Wound #5 Right,Medial Lower Leg o Silver  Alginate Secondary Dressing Wound #4 Medial Sacrum o Boardered Foam Dressing Wound #5 Right,Medial Lower Leg o ABD pad Dressing Change Frequency Wound #4 Medial Sacrum o Change Dressing Monday, Wednesday, Friday Wound #5 Right,Medial Lower Leg o Change dressing every day. Follow-up Appointments Wound #4 Medial Sacrum o Return Appointment in 1 week. Wound #5 Right,Medial Lower Leg o Return Appointment in 1 week. [place], [place] ([id number]) Edema Control Wound #4 Medial Sacrum o 3 Layer Compression System - Bilateral - unna to anchor Wound #5 Right,Medial Lower Leg o 3 Layer Compression System - Bilateral - unna to anchor Off-Loading Wound #4 Medial Sacrum o [facility] Bed with mattress for wound healing o Turn and reposition every 2 hours o Other: - Do not sit on wounded area. Home Health Wound #4 [place] for [place] Nurse may visit PRN to address patientos wound care needs. o FACE TO FACE ENCOUNTER: MEDICARE and MEDICAID PATIENTS: I certify that this patient is under my care and that I had a face-to-face encounter that meets the physician face-to-face encounter requirements with this patient on this date. The encounter with the patient was in whole or in part for the following MEDICAL CONDITION: (primary reason for [place]) MEDICAL NECESSITY: I certify, that based on my findings, NURSING services are a medically necessary home health service. HOME BOUND STATUS: I certify that my clinical findings support that this patient is homebound (i.e., Due to illness or injury, pt requires aid of supportive devices such as crutches, cane, wheelchairs, walkers, the use of special transportation or the assistance of another person to leave their place of residence. There is a normal inability to leave the home and doing so requires considerable and taxing effort. Other  absences are for medical reasons / religious services and are infrequent or of short duration when for other reasons). o If current dressing causes regression in wound condition, may D/C ordered dressing product/s and apply Normal Saline Moist Dressing daily until next [place] / Other MD appointment. [place] of regression in wound condition at [phone]. o Please direct any NON-WOUND related issues/requests for orders to patient's Primary Care Physician Wound #5 [place] for [place] Nurse may visit PRN to address patientos wound care needs. o FACE TO FACE ENCOUNTER: MEDICARE and MEDICAID PATIENTS: I certify that this patient is under my care and that  I had a face-to-face encounter that meets the physician face-to-face encounter requirements with this patient on this date. The encounter with the patient was in whole or in part for the following MEDICAL CONDITION: (primary reason for [place]) MEDICAL NECESSITY: I certify, that based on my findings, NURSING services are a medically necessary home health service. HOME BOUND STATUS: I certify that my clinical findings support that this patient is homebound (i.e., Due to illness or injury, pt requires aid of supportive devices such as crutches, cane, wheelchairs, walkers, the use of special transportation or the assistance of another person to leave their place of residence. There is a normal inability to leave the home and doing so requires considerable and taxing effort. Other absences are for medical reasons / religious services and are infrequent or of short duration when for other reasons). o If current dressing causes regression in wound condition, may D/C ordered dressing product/s and apply Normal Saline Moist Dressing daily until next [place] / Other MD appointment. [place] of regression in wound  condition at [phone]. o Please direct any NON-WOUND related issues/requests for orders to patient's Primary Care Physician Medications-please add to medication list. Wound #4 Medial Sacrum o P.O. Antibiotics Wound #5 Right,Medial Lower Leg o P.O. Antibiotics Patient Medications Allergies: No Known Allergies Notifications Medication Indication Start End Keflex 06/27/2020 DOSE 1 - oral 500 mg capsule - 1 capsule oral taken 4 times per day for 15 days Electronic Signature(s) Signed: 06/27/2020 2:06:04 PM By: [name] PA-C Entered By: [name] on 06/27/2020 [place], [place] ([id number]) Leoni, Marija L. ([id number]) -------------------------------------------------------------------------------- Problem List Details Patient Name: Sanag[place]in, Adelee L. Date of Service: 06/27/2020 12:45 PM Medical Record Number: [id number] Patient Account Number: [id number] Date of Birth/Sex: [birth year] (86 y.o. F) Treating RN: Cornell Barman Primary Care Provider: [name], [name] Other Clinician: Referring Provider: [name], [name] Treating Provider/Extender: [name] Weeks in Treatment: 0 Active Problems ICD-10 Encounter Code Description Active Date MDM Diagnosis L89.153 Pressure ulcer of sacral region, stage 3 06/27/2020 No Yes I89.0 Lymphedema, not elsewhere classified 06/27/2020 No Yes I87.2 Venous insufficiency (chronic) (peripheral) 06/27/2020 No Yes L97.812 Non-pressure chronic ulcer of other part of right lower leg with fat layer 06/27/2020 No Yes exposed [place] (primary) hypertension 06/27/2020 No Yes J44.9 Chronic obstructive pulmonary disease, unspecified 06/27/2020 No Yes Inactive Problems Resolved Problems Electronic Signature(s) Signed: 06/27/2020 1:40:56 PM By: [name] PA-C Entered By: [name] on 06/27/2020 13:40:56 Joyce [name], Joyce [name] L.  ([id number]) -------------------------------------------------------------------------------- Progress Note Details Patient Name: Sann, Masae L. Date of Service: 06/27/2020 12:45 PM Medical Record Number: [id number] Patient Account Number: [id number] Date of Birth/Sex: 1928-03-12 (86 y.o. F) Treating RN: Cornell Barman Primary Care Provider: [name], [name] Other Clinician: Referring Provider: [name], [name] Treating Provider/Extender: Sharalyn Ink in Treatment: 0 Subjective Chief Complaint Information obtained from Patient Sacral pressure ulcer and right leg ulcer History of Present Illness (HPI) 05/14/19 on evaluation today patient appears to be doing rather well in regard to her lower extremities compared to what it sounds like she was doing not that far back. Fortunately there's no signs of active infection at this time although it does sound like she had significant cellulitis earlier in the year. She does also have a issue with a sacral ulcer upon inspection today which has been present since around February 2020 this is about as long as she's been dealing with her legs as well. She is under  palliative care but not hospice. The patient does have a history of scoliosis and kyphosis which makes it difficult for her to lay flat she actually sleep sitting up in a recliner but night and reclined at this cause her trouble breathing. She also has COPD, hypertension, and bilateral lower should be lymphedema and venous stasis. Currently there's no signs of active cellulitis of the bilateral lower extremities which is good news. The patient has not had any vascular intervention nor evaluation that we have on file. She does spend the majority per day apparently sitting in her recliner upright. This I think is an issue both for her legs as well as for the sacral region unfortunately. She could not tolerate the ABI's today due to the fact that it calls too much pain when we were  performing the blood pressure reading on her lower extremities who were not even able to get a proper reading. No fevers, chills, nausea, or vomiting noted at this time. 05/25/19 on evaluation today patient appears to be doing about the same in regard to her sacral wound maybe slightly better compared to previous. Fortunately there's no signs of active infection at this time which is good news in regard to her lower extremities. She has previously had cellulitis therefore were trying to keep an eye on this as far as that's concerned. Nonetheless since I last saw her which was actually roughly 2 weeks ago since due to work her daughter was unable to bring her last week she is not had any care through home health and has not gotten compression stockings for her bilateral lower extremities. Subsequently the issue with home health seems to be multifactorial when they contacted the patient and her daughter in order to get things scheduled they were told according to the daughter that she didn't know why they needed to come out three times a week and wasn't aware that that was what the order was gonna be. The daughter tells me that that somewhat poor timing for her as she works and doesn't have a lot of time to hang around at the house waiting for home health. With that being said I explained that the only thing we can really do is either have home health come out and perform the dressing changes or else potentially have a family member do this. There apparently is no family member including herself who is willing to do this she tells me "that she doesn't feel qualified and comfortable". Nonetheless either way the patient has not had a dressing on the sacral area since the last time she was here she uses a pad hasn't really noted much drainage onto the pad although it does appear that the wound itself trapped a lot of the fluid within skin folds. In the end I do think that since she doesn't really have  anyone to perform the dressing changes at home we are going to need to utilize the services of home health. They are in agreement with that plan. 06/01/19 on evaluation today patient presents for follow-up concerning her wound in the midline sacral area. This seems to be doing well there is some evidence of new skin growth there still an open sore however it is gonna require dressing. Home health is coming out to see her I think we do need to see about getting her a better schedule where potentially she comes entire clinic on Monday and then on Wednesday and Friday his home health change in her dressing as it stands  right now I feel like this is happening to a regular for her. Fortunately there's no signs of any infection at this time her legs are doing well although she does have the compression stockings finally she doesn't actually have those on as of today. They want [place] to show them how to put them on. 06/08/19 on evaluation today patient came in today without any dressing on her sacral region. Apparently this fell off around Friday and unfortunately it was supposed to be reapplied Friday but the patient had an appointment scheduled for 11 o'clock and her home health nurse was supposed to arrive at 43. She was running late and therefore they called and canceled her coming out to perform the dressing change and subsequently even though they had dressing material at home her daughter refuses to apply the dressing to the wound. For that reason she went all weekend without any dressing until today. She also has gotten the compression stockings but has just use the Tubigrip on occasion although she doesn't even have that on today. Apparently her daughter stating that the compression stockings are too hard to get on and therefore they're not able to do that. Nonetheless as I stated patient came in with nothing on her lower extremities as far as compression is concerned. They do want to see about extending  her visits to every two weeks. 07/02/19 on evaluation today patient presents after apparently having been quarantined for 14 days due to having been exposed to someone with Covid-19 Virus during the time that she is been quarantined she has not actually had her dressing changed it all apparently nobody would come around her including her daughter. Nonetheless her daughter hasn't been changing the dressing even prior they did have a home health nurse coming out but it Gillermina Phy may have been about a week's worth of time that the patient actually had home health change in the dressing and even then there was some issues with timing and them actually coming out an appropriate time that the patient and her daughter felt was within reason. Nonetheless they feel like they had a much better experience with [facility] in the past. 07/09/2019 on evaluation today patient appears to be doing unfortunately somewhat the same in regard to her wound. She really has not had a dressing on since we put one on last week. Then her daughter really is not able to perform the dressing changes the patient tells me that she has a hard time with the location of the wound had been able to perform the dressing change at this site. Nonetheless they also had issues with home health they wanted if we would initiate home health therapy to have a different company come out. 10/21/2019 on evaluation today patient presents for follow-up concerning the same issue on her midline sacral region. This is the same area that I was taken care of back last in September and that is the last time I saw her. We have never actually healed this out and at that point I did actually recommend that we were using dressings unfortunately the dressings just never worked out for her. She could not keep these in place. For that reason I did actually suggest that we could try some creams but again then she never came back for follow-up but I am not really  sure exactly what they have been doing in the interim. She has not really noted any significant bleeding and drainage has been she tells me fairly Joyce [name], Joyce  L. ([id number]) minimal both of which are good signs. With that being said unfortunately she still has an open wound which I explained is still a risk factor for infection. She also has an area on her second toe of the right foot right at the bend of the toe where this is somewhat hammered and subsequently rubs on her shoe. I think this too is a pressure/friction injury and subsequently it seems to be doing much better she has been on antibiotics both oral as well as topical mupirocin and taken by mouth she is unsure of what the antibiotic was. However she is also been using some antifungal cream around the toes in general. Overall the toe does seem to be doing much better with no obvious signs of infection at this point 11/11/2019 on evaluation today patient appears to be doing well overall with regard to her wounds. She has been tolerating the dressing changes without complication. We did recommend however that she use Betadine for her toe area they have been using an antibiotic ointment instead that they had. I explained the rationale for why we use the Betadine in order to try to keep this clean and dry preventing infection and gangrene. Nonetheless I think that is still probably the best bet for the toe area. It does not appear to be infected right now and really has not softened up enough to be debrided away in my opinion. With regard to her sacral region this is still something that seems to be doing okay in my opinion I think that she is tolerating the zinc applications and while I would prefer to have a dressing on this area we really just have not been able to keep anything in the spot in order to allow it to heal appropriately. For that reason my suggestion is good to be that we go ahead and continue with the zinc I do see some  evidence of new epithelization around the edges of the wound. I do believe that she still needs to focus on trying to offload and her home aide nurse has mentioned the possibility of getting a hospital bed I think a hospital bed with an air mattress would be of benefit for her. Readmission: Upon evaluation today patient presents for readmission I have not seen her since January of this year. With that being said she still continues to have the wound in the sacral region she also has a wound on the lower extremity as well that is good to be something we have to address at this point to. Fortunately there is no signs of active infection at this time which is good news in regard to her lower extremity. There is little bit of erythema around I am not 100% sure this is evidence of cellulitis at this point though it is not out of the question either to be honest. I believe it is likely more inflammatory based on what I am seeing. Fortunately there is no signs of systemic infection at this point. In regard to the sacral wound she still continues to have this and based on what I am seeing initially I think this has gotten significantly worse. Patient History Information obtained from Patient. Allergies No Known Allergies Family History Cancer - Child, Diabetes - Siblings, Hypertension - Siblings, Stroke - Mother, No family history of Heart Disease, Kidney Disease, Lung Disease, Seizures, Thyroid Problems, Tuberculosis. Social History Never smoker, Marital Status - Widowed, Alcohol Use - Rarely, Drug Use - No History, Caffeine Use -  Daily. Medical History Eyes Denies history of Cataracts, Glaucoma, Optic Neuritis Ear/Nose/Mouth/Throat Denies history of Chronic sinus problems/congestion, Middle ear problems Hematologic/Lymphatic Denies history of Anemia, Hemophilia, Human Immunodeficiency Virus, Lymphedema, Sickle Cell Disease Respiratory Patient has history of Chronic Obstructive Pulmonary Disease  (COPD) Denies history of Aspiration, Asthma, Pneumothorax, Sleep Apnea, Tuberculosis Cardiovascular Denies history of Angina, Arrhythmia, Congestive Heart Failure, Coronary Artery Disease, Deep Vein Thrombosis, Hypertension, Hypotension, Myocardial Infarction, Peripheral Arterial Disease, Peripheral Venous Disease, Phlebitis, Vasculitis Gastrointestinal Denies history of Cirrhosis , Colitis, Crohn s, Hepatitis A, Hepatitis B, Hepatitis C Endocrine Denies history of Type I Diabetes, Type II Diabetes Genitourinary Denies history of End Stage Renal Disease Immunological Denies history of Lupus Erythematosus, Raynaud s, Scleroderma Integumentary (Skin) Patient has history of History of pressure wounds - Backside and toe Musculoskeletal Patient has history of Osteoarthritis - hands, knees, sholders Denies history of Gout, Rheumatoid Arthritis Neurologic Denies history of Dementia, Neuropathy, Quadriplegia, Paraplegia, Seizure Disorder Oncologic Denies history of Received Chemotherapy, Received Radiation Psychiatric Denies history of Anorexia/bulimia Hospitalization/Surgery History - knee replacement. - mastectomy. - thyroidectomy. Medical And Surgical History Notes Oncologic Breast Cancer, Thyroid Cancer Joyce [name], [name] ([id number]) Review of Systems (ROS) Constitutional Symptoms (General Health) Denies complaints or symptoms of Fatigue, Fever, Chills, Marked Weight Change. Eyes Complains or has symptoms of Glasses / Contacts - reading. Denies complaints or symptoms of Dry Eyes, Vision Changes. Ear/Nose/Mouth/Throat Denies complaints or symptoms of Difficult clearing ears, Sinusitis. Hematologic/Lymphatic Denies complaints or symptoms of Bleeding / Clotting Disorders, Human Immunodeficiency Virus. Respiratory Denies complaints or symptoms of Chronic or frequent coughs, Shortness of Breath. Cardiovascular Denies complaints or symptoms of Chest pain, LE  edema. Gastrointestinal Denies complaints or symptoms of Frequent diarrhea, Nausea, Vomiting. Endocrine Denies complaints or symptoms of Hepatitis, Thyroid disease, Polydypsia (Excessive Thirst). Genitourinary Denies complaints or symptoms of Kidney failure/ Dialysis, Incontinence/dribbling, overactive bladder Immunological Denies complaints or symptoms of Hives, Itching. Integumentary (Skin) Complains or has symptoms of Wounds - sacrum and right lower leg. Musculoskeletal Denies complaints or symptoms of Muscle Pain, Muscle Weakness. Neurologic Denies complaints or symptoms of Numbness/parasthesias, Focal/Weakness. Psychiatric Complains or has symptoms of Anxiety. Denies complaints or symptoms of Claustrophobia. Objective Constitutional sitting or standing blood pressure is within target range for patient.. pulse regular and within target range for patient.Marland Kitchen respirations regular, non- labored and within target range for patient.Marland Kitchen temperature within target range for patient.. Well-nourished and well-hydrated in no acute distress. Vitals Time Taken: 12:54 PM, Temperature: 98.6 F, Pulse: 85 bpm, Respiratory Rate: 16 breaths/min, Blood Pressure: 119/61 mmHg. Eyes conjunctiva clear no eyelid edema noted. pupils equal round and reactive to light and accommodation. Ears, Nose, Mouth, and Throat no gross abnormality of ear auricles or external auditory canals. normal hearing noted during conversation. mucus membranes moist. Respiratory normal breathing without difficulty. Cardiovascular 1+ dorsalis pedis/posterior tibialis pulses. 2+ pitting edema of the bilateral lower extremities. Musculoskeletal stooped posture. Psychiatric this patient is able to make decisions and demonstrates good insight into disease process. Alert and Oriented x 3. pleasant and cooperative. General Notes: Upon inspection patient's wound on lower extremity actually appears to be doing quite well there is some  signs of erythema surrounding the wound bed but again I believe this is likely more secondary to just inflammation from the wound not necessarily that this is indicative of an overt infection. With that being said it something I am to keep an eye on we will see how things appear next week when I see her back. With that being  said in regard to the sacral wound this is significantly worse at this point unfortunately. There is actually some undermining as well which does have me concerned that is not something that we saw previous. Obviously I think this is a decline overall in the wound compared to when I last saw her. Integumentary (Hair, Skin) Wound #4 status is Open. Original cause of wound was Gradually Appeared. The wound is located on the Medial Sacrum. The wound measures 2cm length x 1cm width x 1cm depth; 1.571cm^2 area and 1.571cm^3 volume. There is Fat Layer (Subcutaneous Tissue) exposed. There is no Lias, Maylene L. ([id number]) tunneling noted, however, there is undermining starting at 12:00 and ending at 2:00 with a maximum distance of 1.5cm. There is a medium amount of serosanguineous drainage noted. The wound margin is flat and intact. There is large (67-100%) red granulation within the wound bed. There is no necrotic tissue within the wound bed. Wound #5 status is Open. Original cause of wound was Gradually Appeared. The wound is located on the Right,Medial Lower Leg. The wound measures 1.5cm length x 1cm width x 0.1cm depth; 1.178cm^2 area and 0.118cm^3 volume. There is no tunneling or undermining noted. There is a medium amount of serosanguineous drainage noted. The wound margin is distinct with the outline attached to the wound base. There is small (1-33%) red granulation within the wound bed. There is a medium (34-66%) amount of necrotic tissue within the wound bed including Adherent Slough. Other Condition(s) Patient presents with Lymphedema located on the Left  Leg. Assessment Active Problems ICD-10 Pressure ulcer of sacral region, stage 3 Lymphedema, not elsewhere classified Venous insufficiency (chronic) (peripheral) Non-pressure chronic ulcer of other part of right lower leg with fat layer exposed Essential (primary) hypertension Chronic obstructive pulmonary disease, unspecified Procedures Wound #5 Pre-procedure diagnosis of Wound #5 is a Venous Leg Ulcer located on the Right,Medial Lower Leg . There was a Three Layer Compression Therapy Procedure with a pre-treatment ABI of 1.4 by Grover Canavan, RN. Post procedure Diagnosis Wound #5: Same as Pre-Procedure There was a Three Layer Compression Therapy Procedure with a pre-treatment ABI of 1.2 by Grover Canavan, RN. Post procedure Diagnosis Wound #: Same as Pre-Procedure Plan Wound Cleansing: Wound #4 Medial Sacrum: Clean wound with Normal Saline. May shower with protection. - do not get dressing wet Wound #5 Right,Medial Lower Leg: Clean wound with Normal Saline. May shower with protection. - do not get dressing wet Anesthetic (add to Medication List): Wound #4 Medial Sacrum: Topical Lidocaine 4% cream applied to wound bed prior to debridement (In Clinic Only). Wound #5 Right,Medial Lower Leg: Topical Lidocaine 4% cream applied to wound bed prior to debridement (In Clinic Only). Primary Wound Dressing: Wound #4 Medial Sacrum: Silver Alginate Wound #5 Right,Medial Lower Leg: Silver Alginate Secondary Dressing: Wound #4 Medial Sacrum: Boardered Foam Dressing Wound #5 Right,Medial Lower Leg: ABD pad Dressing Change Frequency: Wound #4 Medial Sacrum: Trippett, Alysabeth L. ([id number]) Change Dressing Monday, Wednesday, Friday Wound #5 Right,Medial Lower Leg: Change dressing every day. Follow-up Appointments: Wound #4 Medial Sacrum: Return Appointment in 1 week. Wound #5 Right,Medial Lower Leg: Return Appointment in 1 week. Edema Control: Wound #4 Medial Sacrum: 3 Layer  Compression System - Bilateral - unna to anchor Wound #5 Right,Medial Lower Leg: 3 Layer Compression System - Bilateral - unna to anchor Off-Loading: Wound #4 Medial Sacrum: [facility] Bed with mattress for wound healing Turn and reposition every 2 hours Other: - Do not sit on wounded area. Home  Health: Wound #4 Medial Sacrum: [place] for [place] Nurse may visit PRN to address patient s wound care needs. FACE TO FACE ENCOUNTER: MEDICARE and MEDICAID PATIENTS: I certify that this patient is under my care and that I had a face-to-face encounter that meets the physician face-to-face encounter requirements with this patient on this date. The encounter with the patient was in whole or in part for the following MEDICAL CONDITION: (primary reason for [place]) MEDICAL NECESSITY: I certify, that based on my findings, NURSING services are a medically necessary home health service. HOME BOUND STATUS: I certify that my clinical findings support that this patient is homebound (i.e., Due to illness or injury, pt requires aid of supportive devices such as crutches, cane, wheelchairs, walkers, the use of special transportation or the assistance of another person to leave their place of residence. There is a normal inability to leave the home and doing so requires considerable and taxing effort. Other absences are for medical reasons / religious services and are infrequent or of short duration when for other reasons). If current dressing causes regression in wound condition, may D/C ordered dressing product/s and apply Normal Saline Moist Dressing daily until next [employer] / Other MD appointment. [place] of regression in wound condition at [phone]. Please direct any NON-WOUND related issues/requests for orders to patient's Primary Care Physician Wound #5 Right,Medial Lower Leg: [place] for  [place] Nurse may visit PRN to address patient s wound care needs. FACE TO FACE ENCOUNTER: MEDICARE and MEDICAID PATIENTS: I certify that this patient is under my care and that I had a face-to-face encounter that meets the physician face-to-face encounter requirements with this patient on this date. The encounter with the patient was in whole or in part for the following MEDICAL CONDITION: (primary reason for [place]) MEDICAL NECESSITY: I certify, that based on my findings, NURSING services are a medically necessary home health service. HOME BOUND STATUS: I certify that my clinical findings support that this patient is homebound (i.e., Due to illness or injury, pt requires aid of supportive devices such as crutches, cane, wheelchairs, walkers, the use of special transportation or the assistance of another person to leave their place of residence. There is a normal inability to leave the home and doing so requires considerable and taxing effort. Other absences are for medical reasons / religious services and are infrequent or of short duration when for other reasons). If current dressing causes regression in wound condition, may D/C ordered dressing product/s and apply Normal Saline Moist Dressing daily until next [place] / Other MD appointment. [place] of regression in wound condition at [phone]. Please direct any NON-WOUND related issues/requests for orders to patient's Primary Care Physician Medications-please add to medication list.: Wound #4 Medial Sacrum: P.O. Antibiotics Wound #5 Right,Medial Lower Leg: P.O. Antibiotics The following medication(s) was prescribed: Keflex oral 500 mg capsule 1 1 capsule oral taken 4 times per day for 15 days starting 06/27/2020 1. I would recommend at this time that we go ahead and initiate a silver alginate dressing to both wound locations. I think this will be appropriate for her at this  time. 2. I am also can recommend a 3 layer compression wrap to the right lower extremity to help with this wound and the edema here to try to get this under better control. 3. I would also recommend she elevate her legs  much as possible she really should be sleeping in the bed not in her recliner. This would help both her gluteal region as well as her legs. I think that she really does need a hospital bed even with an air mattress would be ideal. She does have palliative care coming out as well and this point they have been trying to get her to get 1 of these but she is not agreed to that as of yet. We will see patient back for reevaluation in 1 week here in the clinic. If anything worsens or changes patient will contact our office for additional recommendations. Electronic Signature(s) Signed: 06/27/2020 7:06:42 PM By: [name] PA-C Entered By: [name] on 06/27/2020 19:06:41 Joyce [name], [name] ([id number]) -------------------------------------------------------------------------------- ROS/PFSH Details Patient Name: Joyce [name], [name] Date of Service: 06/27/2020 12:45 PM Medical Record Number: [id number] Patient Account Number: [id number] Date of Birth/Sex: [birth year] (86 y.o. F) Treating RN: Grover Canavan Primary Care Provider: [name], [name] Other Clinician: Referring Provider: [name] Treating Provider/Extender: Melburn Hake, Diamonte Stavely Weeks in Treatment: 0 Information Obtained From Patient Constitutional Symptoms (General Health) Complaints and Symptoms: Negative for: Fatigue; Fever; Chills; Marked Weight Change Eyes Complaints and Symptoms: Positive for: Glasses / Contacts - reading Negative for: Dry Eyes; Vision Changes Medical History: Negative for: Cataracts; Glaucoma; Optic Neuritis Ear/Nose/Mouth/Throat Complaints and Symptoms: Negative for: Difficult clearing ears; Sinusitis Medical History: Negative for: Chronic sinus problems/congestion; Middle  ear problems Hematologic/Lymphatic Complaints and Symptoms: Negative for: Bleeding / Clotting Disorders; Human Immunodeficiency Virus Medical History: Negative for: Anemia; Hemophilia; Human Immunodeficiency Virus; Lymphedema; Sickle Cell Disease Respiratory Complaints and Symptoms: Negative for: Chronic or frequent coughs; Shortness of Breath Medical History: Positive for: Chronic Obstructive Pulmonary Disease (COPD) Negative for: Aspiration; Asthma; Pneumothorax; Sleep Apnea; Tuberculosis Cardiovascular Complaints and Symptoms: Negative for: Chest pain; LE edema Medical History: Negative for: Angina; Arrhythmia; Congestive Heart Failure; Coronary Artery Disease; Deep Vein Thrombosis; Hypertension; Hypotension; Myocardial Infarction; Peripheral Arterial Disease; Peripheral Venous Disease; Phlebitis; Vasculitis Gastrointestinal Complaints and Symptoms: Negative for: Frequent diarrhea; Nausea; Vomiting Medical History: Negative for: Cirrhosis ; Colitis; Crohnos; Hepatitis A; Hepatitis B; Hepatitis C Endocrine Joyce [name], [name] ([id number]) Complaints and Symptoms: Negative for: Hepatitis; Thyroid disease; Polydypsia (Excessive Thirst) Medical History: Negative for: Type I Diabetes; Type II Diabetes Genitourinary Complaints and Symptoms: Negative for: Kidney failure/ Dialysis; Incontinence/dribbling Review of System Notes: overactive bladder Medical History: Negative for: End Stage Renal Disease Immunological Complaints and Symptoms: Negative for: Hives; Itching Medical History: Negative for: Lupus Erythematosus; Raynaudos; Scleroderma Integumentary (Skin) Complaints and Symptoms: Positive for: Wounds - sacrum and right lower leg Medical History: Positive for: History of pressure wounds - Backside and toe Musculoskeletal Complaints and Symptoms: Negative for: Muscle Pain; Muscle Weakness Medical History: Positive for: Osteoarthritis - hands, knees, sholders Negative  for: Gout; Rheumatoid Arthritis Neurologic Complaints and Symptoms: Negative for: Numbness/parasthesias; Focal/Weakness Medical History: Negative for: Dementia; Neuropathy; Quadriplegia; Paraplegia; Seizure Disorder Psychiatric Complaints and Symptoms: Positive for: Anxiety Negative for: Claustrophobia Medical History: Negative for: Anorexia/bulimia Oncologic Medical History: Negative for: Received Chemotherapy; Received Radiation Past Medical History Notes: Breast Cancer, Thyroid Cancer Immunizations Pneumococcal Vaccine: Received Pneumococcal Vaccination: Yes Implantable Devices Joyce [name], [name] ([id number]) None Hospitalization / Surgery History Type of Hospitalization/Surgery knee replacement mastectomy thyroidectomy Family and Social History Cancer: Yes - Child; Diabetes: Yes - Siblings; Heart Disease: No; Hypertension: Yes - Siblings; Kidney Disease: No; Lung Disease: No; Seizures: No; Stroke: Yes - Mother; Thyroid Problems: No; Tuberculosis: No; Never smoker; Marital Status - Widowed;  Alcohol Use: Rarely; Drug Use: No History; Caffeine Use: Daily; Financial Concerns: No; Food, Clothing or Shelter Needs: No; Support System Lacking: No; Transportation Concerns: No Electronic Signature(s) Signed: 06/27/2020 5:37:30 PM By: Grover Canavan Signed: 06/27/2020 7:41:44 PM By: [name] PA-C Entered By: Grover Canavan on 06/27/2020 13:00:13 [place], [place] ([id number]) -------------------------------------------------------------------------------- SuperBill Details Patient Name: Joyce [name], [name] Date of Service: 06/27/2020 Medical Record Number: [id number] Patient Account Number: [id number] Date of Birth/Sex: [birth year] (86 y.o. F) Treating RN: Grover Canavan Primary Care Provider: [name], [name] Other Clinician: Referring Provider: [name], [name] Treating Provider/Extender: Melburn Hake, Darshawn Boateng Weeks in Treatment: 0 Diagnosis Coding ICD-10 Codes Code  Description L89.153 Pressure ulcer of sacral region, stage 3 I89.0 Lymphedema, not elsewhere classified I87.2 Venous insufficiency (chronic) (peripheral) L97.812 Non-pressure chronic ulcer of other part of right lower leg with fat layer exposed I10 Essential (primary) hypertension J44.9 Chronic obstructive pulmonary disease, unspecified Facility Procedures CPT4: Description Modifier Quantity Code [id number] 99213 - WOUND CARE VISIT-LEV 3 EST PT 1 CPT4: 92330076 [id number] BILATERAL: Application of multi-layer venous compression system; leg (below knee), including 1 ankle and foot. Physician Procedures CPT4 Code: 3545625 Description: 63893 - WC PHYS LEVEL 4 - EST PT Modifier: Quantity: 1 CPT4 Code: Description: ICD-10 Diagnosis Description L89.153 Pressure ulcer of sacral region, stage 3 I89.0 Lymphedema, not elsewhere classified I87.2 Venous insufficiency (chronic) (peripheral) L97.812 Non-pressure chronic ulcer of other part of right lower leg  with fat la Modifier: yer exposed Quantity: Electronic Signature(s) Signed: 06/27/2020 7:07:10 PM By: [name] PA-C Previous Signature: 06/27/2020 5:37:30 PM Version By: Grover Canavan Entered By: [name] on 06/27/2020 19:07:10

## 2020-06-28 NOTE — Progress Notes (Signed)
ERUM, CERCONE ([id number]) Visit Report for 06/27/2020 Abuse/Suicide Risk Screen Details Patient Name: Joyce [name], [name] Date of Service: 06/27/2020 12:45 PM Medical Record Number: [id number] Patient Account Number: [id number] Date of Birth/Sex: 1928/08/14 (86 y.o. F) Treating RN: Grover Canavan Primary Care Lord Lancour: [name], [name] Other Clinician: Referring Angeletta Goelz: [name], [name] Treating Minha Fulco/Extender: Melburn Hake, HOYT Weeks in Treatment: 0 Abuse/Suicide Risk Screen Items Answer ABUSE RISK SCREEN: Has anyone close to you tried to hurt or harm you recentlyo No Do you feel uncomfortable with anyone in your familyo No Has anyone forced you do things that you didnot want to doo No Electronic Signature(s) Signed: 06/27/2020 5:37:30 PM By: Grover Canavan Entered By: Grover Canavan on 06/27/2020 [place], [place]. ([id number]) -------------------------------------------------------------------------------- Activities of Daily Living Details Patient Name: Joyce [name], [name] Date of Service: 06/27/2020 12:45 PM Medical Record Number: [id number] Patient Account Number: [id number] Date of Birth/Sex: [birth year] (86 y.o. F) Treating RN: Grover Canavan Primary Care Ardith Test: [name], [name] Other Clinician: Referring Thatiana Renbarger: [name], [name] Treating Sammye Staff/Extender: Melburn Hake, HOYT Weeks in Treatment: 0 Activities of Daily Living Items Answer Activities of Daily Living (Please select one for each item) Drive Automobile Not Able Take Medications Need Assistance Use Telephone Completely Able Care for Appearance Need Assistance Use Toilet Completely Able Bath / Shower Need Assistance Dress Self Completely Able Feed Self Completely Able Walk Need Assistance Get In / Out Bed Completely [place] Need Assistance Shop for Self Need Assistance Electronic Signature(s) Signed: 06/27/2020  5:37:30 PM By: Grover Canavan Entered By: Grover Canavan on 06/27/2020 13:01:21 Faley, [name] ([id number]) -------------------------------------------------------------------------------- Education Screening Details Patient Name: Joyce [name], [name] Date of Service: 06/27/2020 12:45 PM Medical Record Number: [id number] Patient Account Number: [id number] Date of Birth/Sex: 1928-05-14 (86 y.o. F) Treating RN: Grover Canavan Primary Care Nashley Cordoba: [name], [name] Other Clinician: Referring Aretha Levi: [name], [name] Treating Brenden Rudman/Extender: Sharalyn Ink in Treatment: 0 Learning Preferences/Education Level/Primary Language Learning Preference: Explanation, Demonstration, Printed Material Highest Education Level: High School Preferred Language: English Cognitive Barrier Language Barrier: No Translator Needed: No Memory Deficit: No Emotional Barrier: No Cultural/Religious Beliefs Affecting Medical Care: No Physical Barrier Impaired Vision: No Impaired Hearing: No Decreased Hand dexterity: No Knowledge/Comprehension Knowledge Level: High Comprehension Level: High Ability to understand written instructions: High Ability to understand verbal instructions: High Motivation Anxiety Level: Calm Cooperation: Cooperative Education Importance: Acknowledges Need Interest in Health Problems: Asks Questions Perception: Coherent Willingness to Engage in Self-Management High Activities: Readiness to Engage in Self-Management High Activities: Electronic Signature(s) Signed: 06/27/2020 5:37:30 PM By: Grover Canavan Entered By: Grover Canavan on 06/27/2020 13:01:48 Ly[place], [name] ([id number]) -------------------------------------------------------------------------------- Fall Risk Assessment Details Patient Name: Joyce [name], [name] Date of Service: 06/27/2020 12:45 PM Medical Record Number: [id number] Patient Account Number: [id number] Date of Birth/Sex: 06-25-1928  (86 y.o. F) Treating RN: Grover Canavan Primary Care Kalep Full: [name], [name] Other Clinician: Referring Iokepa Geffre: [name], [name] Treating Ayelen Sciortino/Extender: Melburn Hake, HOYT Weeks in Treatment: 0 Fall Risk Assessment Items Have you had 2 or more falls in the last 12 monthso 0 No Have you had any fall that resulted in injury in the last 12 monthso 0 No FALLS RISK SCREEN History of falling - immediate or within 3 months 0 No Secondary diagnosis (Do you have 2 or more medical diagnoseso) 0 No Ambulatory aid None/bed rest/wheelchair/nurse 0 No Crutches/cane/walker 0 No Furniture 0 No Intravenous therapy Access/Saline/Heparin Lock 0 No Gait/Transferring Normal/ bed rest/  wheelchair 0 No Weak (short steps with or without shuffle, stooped but able to lift head while walking, may 0 No seek support from furniture) Impaired (short steps with shuffle, may have difficulty arising from chair, head down, impaired 0 No balance) Mental Status Oriented to own ability 0 No Electronic Signature(s) Signed: 06/27/2020 5:37:30 PM By: Grover Canavan Entered By: Grover Canavan on 06/27/2020 13:02:02 Joyce [name], Ernest. ([id number]) -------------------------------------------------------------------------------- Foot Assessment Details Patient Name: Joyce [name], [name] Date of Service: 06/27/2020 12:45 PM Medical Record Number: [id number] Patient Account Number: [id number] Date of Birth/Sex: August 29, 1928 (86 y.o. F) Treating RN: Grover Canavan Primary Care Herny Scurlock: [name], [name] Other Clinician: Referring Odas Ozer: [name], [name] Treating Chason Mciver/Extender: Melburn Hake, HOYT Weeks in Treatment: 0 Foot Assessment Items Site Locations + = Sensation present, - = Sensation absent, C = Callus, U = Ulcer R = Redness, W = Warmth, M = Maceration, PU = Pre-ulcerative lesion F = Fissure, S = Swelling, D = Dryness Assessment Right: Left: Other Deformity: No No Prior Foot Ulcer: No  No Prior Amputation: No No Charcot Joint: No No Ambulatory Status: Gait: Electronic Signature(s) Signed: 06/27/2020 5:37:30 PM By: Grover Canavan Entered By: Grover Canavan on 06/27/2020 13:02:37 Joyce [name], [name] ([id number]) -------------------------------------------------------------------------------- Nutrition Risk Screening Details Patient Name: Joyce [name], [name] Date of Service: 06/27/2020 12:45 PM Medical Record Number: [id number] Patient Account Number: [id number] Date of Birth/Sex: 02/11/1928 (86 y.o. F) Treating RN: Grover Canavan Primary Care Jisele Price: [name], [name] Other Clinician: Referring Tacey Dimaggio: [name], [name] Treating Hubert Raatz/Extender: Melburn Hake, HOYT Weeks in Treatment: 0 Height (in): Weight (lbs): Body Mass Index (BMI): Nutrition Risk Screening Items Score Screening NUTRITION RISK SCREEN: I have an illness or condition that made me change the kind and/or amount of food I eat 0 No I eat fewer than two meals per day 0 No I eat few fruits and vegetables, or milk products 0 No I have three or more drinks of beer, liquor or wine almost every day 0 No I have tooth or mouth problems that make it hard for me to eat 0 No I don't always have enough money to buy the food I need 0 No I eat alone most of the time 0 No I take three or more different prescribed or over-the-counter drugs a day 1 Yes Without wanting to, I have lost or gained 10 pounds in the last six months 0 No I am not always physically able to shop, cook and/or feed myself 0 No Nutrition Protocols Good Risk Protocol 0 No interventions needed Moderate Risk Protocol High Risk Proctocol Risk Level: Good Risk Score: 1 Electronic Signature(s) Signed: 06/27/2020 5:37:30 PM By: Grover Canavan Entered By: Grover Canavan on 06/27/2020 13:02:31

## 2020-06-29 ENCOUNTER — Telehealth: Payer: Self-pay

## 2020-06-29 ENCOUNTER — Other Ambulatory Visit: Payer: Self-pay | Admitting: Family Medicine

## 2020-06-29 DIAGNOSIS — L97812 Non-pressure chronic ulcer of other part of right lower leg with fat layer exposed: Secondary | ICD-10-CM | POA: Diagnosis not present

## 2020-06-29 DIAGNOSIS — L89153 Pressure ulcer of sacral region, stage 3: Secondary | ICD-10-CM | POA: Diagnosis not present

## 2020-06-29 DIAGNOSIS — J449 Chronic obstructive pulmonary disease, unspecified: Secondary | ICD-10-CM | POA: Diagnosis not present

## 2020-06-29 DIAGNOSIS — I10 Essential (primary) hypertension: Secondary | ICD-10-CM | POA: Diagnosis not present

## 2020-06-29 DIAGNOSIS — F411 Generalized anxiety disorder: Secondary | ICD-10-CM

## 2020-06-29 DIAGNOSIS — I890 Lymphedema, not elsewhere classified: Secondary | ICD-10-CM | POA: Diagnosis not present

## 2020-06-29 DIAGNOSIS — I872 Venous insufficiency (chronic) (peripheral): Secondary | ICD-10-CM | POA: Diagnosis not present

## 2020-06-29 NOTE — Telephone Encounter (Signed)
Telephone call to patients [name] to schedule palliative care visit.  Daughter in agreement with RN making home visit 07/04/19 at 1:30 PM.

## 2020-07-03 ENCOUNTER — Other Ambulatory Visit: Payer: Medicare Other

## 2020-07-03 ENCOUNTER — Other Ambulatory Visit: Payer: Self-pay

## 2020-07-03 VITALS — BP 122/64 | HR 69 | Temp 98.9°F | Resp 20 | Wt 120.0 lb

## 2020-07-03 DIAGNOSIS — Z515 Encounter for palliative care: Secondary | ICD-10-CM

## 2020-07-03 NOTE — Progress Notes (Signed)
PATIENT NAME: Joyce [name] DOB: 03/25/1928 MRN: [id number]  PRIMARY CARE PROVIDER: Jerrol Banana., MD  RESPONSIBLE PARTY:  Acct ID - Guarantor Home Phone Work Phone Relationship Acct Type  [id number] Joyce, [name]  Self P/F     [address], [place], [place] [address]    PLAN OF CARE and INTERVENTIONS:               1.  GOALS OF CARE/ ADVANCE CARE PLANNING: Remain in home with daughter Joyce [name] and be comfortable.               2.  PATIENT/CAREGIVER EDUCATION:  Education on fall precautions, education on s/s of infection, reviewed meds, support               3.  DISEASE STATUS:RN made scheduled routine home visit. Nurse met with patient and her daughter Joyce [name]. Patient sitting in recliner chair in her room. Joyce [name] reports [facility] is making visits to provide wound care to sacral decubitus between sacral folds.  Patient also has developed weeping and an ulcer on right lower extremity.  Patients right lower extremity was wrapped at wound center and patient goes to wound center tomorrow to have wrap removed and lower extremity assessed.  Patient denies having any pain at the present time. Patients daughter reports patient remains reluctant to have hospital bed delivered. Nurse provided education to patient and daughter again on the importance of patient being able to reposition and hospital bed would allow for patients legs to be elevated to decrease swelling. Patient in agreement. Nurse will contact wound center [facility] and request order for hospital bed. Patient was also started on doxycycline and daughter added probiotic to patients daily medications. Patients appetite remains good. Patients current weight 120 lbs. Patient remains an able to ambulate with her rollator walker. Patient wears Pull-Ups for intermittent urinary incontinence. Patient denies suffering any recent falls.  Patients vital signs are stable. Patient breath sounds are diminished with some scattered  rhonchi throughout left lung. Patient completed Nebulizer treatment yesterday but has not use nebulizer today. Education to patient to complete nebulizer treatment to help move fluid from lung. Patient reports she has been sleeping well at night. Daughter reports she has been giving patient protein shake daily to for extra protein. Patient and daughter remain in agreement with palliative care services. Patient and daughter encouraged to contact palliative care with questions or concerns.      HISTORY OF PRESENT ILLNESS:  Patient is a 86 year old female who resides in home with her daughter and son in law.  Patient is followed by palliative care and is seen monthly and PRN.    CODE STATUS: DNR  ADVANCED DIRECTIVES: Y MOST FORM: No PPS: 50%   PHYSICAL EXAM:   VITALS: Today's Vitals   07/03/20 1350  BP: 122/64  Pulse: 69  Resp: 20  Temp: 98.9 F (37.2 C)  TempSrc: Temporal  SpO2: 92%  Weight: 120 lb (54.4 kg)  PainSc: 0-No pain    LUNGS: decreased breath sounds with rhonchi in left lung. CARDIAC: Cor RRR  EXTREMITIES: wraps on lower extremities.  Wound on RLE that wound center is following  SKIN: Stage II sacral decub, Joyce [name] nurse providing wound care.  NEURO: positive for gait problems and weakness       [name], RN

## 2020-07-04 ENCOUNTER — Encounter: Payer: Medicare Other | Admitting: Physician Assistant

## 2020-07-04 ENCOUNTER — Other Ambulatory Visit: Payer: Self-pay

## 2020-07-04 DIAGNOSIS — J449 Chronic obstructive pulmonary disease, unspecified: Secondary | ICD-10-CM | POA: Diagnosis not present

## 2020-07-04 DIAGNOSIS — I10 Essential (primary) hypertension: Secondary | ICD-10-CM | POA: Diagnosis not present

## 2020-07-04 DIAGNOSIS — I872 Venous insufficiency (chronic) (peripheral): Secondary | ICD-10-CM | POA: Diagnosis not present

## 2020-07-04 DIAGNOSIS — I890 Lymphedema, not elsewhere classified: Secondary | ICD-10-CM | POA: Diagnosis not present

## 2020-07-04 DIAGNOSIS — L97812 Non-pressure chronic ulcer of other part of right lower leg with fat layer exposed: Secondary | ICD-10-CM | POA: Diagnosis not present

## 2020-07-04 DIAGNOSIS — L89153 Pressure ulcer of sacral region, stage 3: Secondary | ICD-10-CM | POA: Diagnosis not present

## 2020-07-04 NOTE — Progress Notes (Signed)
IESHIA, HATCHER ([id number]) Visit Report for 07/04/2020 Chief Complaint Document Details Patient Name: [name], [name] Date of Service: 07/04/2020 8:30 AM Medical Record Number: [id number] Patient Account Number: [id number] Date of Birth/Sex: 08-09-1928 (86 y.o. F) Treating RN: Cornell Barman Primary Care Provider: [name], [name] Other Clinician: Referring Provider: [name] Treating Provider/Extender: Melburn Hake, Sebastiano Luecke Weeks in Treatment: 1 Information Obtained from: Patient Chief Complaint Sacral pressure ulcer and right leg ulcer Electronic Signature(s) Signed: 07/04/2020 8:51:03 AM By: [name] PA-C Entered By: [name] on 07/04/2020 08:51:03 Slavens, Arshiya L. ([id number]) -------------------------------------------------------------------------------- Problem List Details Patient Name: [name], [name] Date of Service: 07/04/2020 8:30 AM Medical Record Number: [id number] Patient Account Number: [id number] Date of Birth/Sex: Oct 14, 1928 (86 y.o. F) Treating RN: Cornell Barman Primary Care Provider: [name], [name] Other Clinician: Referring Provider: [name], [name] Treating Provider/Extender: Sharalyn Ink in Treatment: 1 Active Problems ICD-10 Encounter Code Description Active Date MDM Diagnosis L89.153 Pressure ulcer of sacral region, stage 3 06/27/2020 No Yes I89.0 Lymphedema, not elsewhere classified 06/27/2020 No Yes I87.2 Venous insufficiency (chronic) (peripheral) 06/27/2020 No Yes L97.812 Non-pressure chronic ulcer of other part of right lower leg with fat layer 06/27/2020 No Yes exposed [place] (primary) hypertension 06/27/2020 No Yes J44.9 Chronic obstructive pulmonary disease, unspecified 06/27/2020 No Yes Inactive Problems Resolved Problems Electronic Signature(s) Signed: 07/04/2020 8:50:57 AM By: [name] PA-C Entered By: [name] on 07/04/2020 08:50:57

## 2020-07-05 NOTE — Progress Notes (Signed)
AIMIE, WAGMAN ([id number]) Visit Report for 07/04/2020 Arrival Information Details Patient Name: [name], [name] Date of Service: 07/04/2020 8:30 AM Medical Record Number: [id number] Patient Account Number: [id number] Date of Birth/Sex: 1928/04/19 (86 y.o. F) Treating RN: Joyce [name] Primary Care Joyce [name]: [name], Joyce [name] Other Clinician: Referring Joyce [name]: [name], Joyce [name] Treating Joyce [name]: Joyce [name] in Treatment: 1 Visit Information History Since Last Visit Added or deleted any medications: No Patient Arrived: Joyce [name] Any new allergies or adverse reactions: No Arrival Time: 08:43 Had a fall or experienced change in No Accompanied By: self activities of daily living that may affect Transfer Assistance: None risk of falls: Patient Identification Verified: Yes Signs or symptoms of abuse/neglect since last visito No Secondary Verification Process Completed: Yes Hospitalized since last visit: No Implantable device outside of the clinic excluding No cellular tissue based products placed in the center since last visit: Has Dressing in Place as Prescribed: Yes Pain Present Now: No Electronic Signature(s) Signed: 07/05/2020 3:55:47 PM By: Joyce [name] Joyce [name] Entered By: Joyce [name] on 07/04/2020 08:44:53 [name], [name] ([id number]) -------------------------------------------------------------------------------- Clinic Level of Care Assessment Details Patient Name: Joyce [name], [name] Date of Service: 07/04/2020 8:30 AM Medical Record Number: [id number] Patient Account Number: [id number] Date of Birth/Sex: 06/25/1928 (86 y.o. F) Treating RN: Joyce [name] Primary Care Glema Takaki: [name], Joyce [name] Other Clinician: Referring Joyce [name]: [name], Joyce [name] Treating Joyce [name]: Joyce [name] [name] in Treatment: 1 Clinic Level of Care Assessment Items TOOL 4 Quantity Score []  - Use when only an EandM is  performed on FOLLOW-UP visit 0 ASSESSMENTS - Nursing Assessment / Reassessment X - Reassessment of Co-morbidities (includes updates in patient status) 1 10 X- 1 5 Reassessment of Adherence to Treatment Plan ASSESSMENTS - Wound and Skin Assessment / Reassessment []  - Simple Wound Assessment / Reassessment - one wound 0 X- 3 5 Complex Wound Assessment / Reassessment - multiple wounds []  - 0 Dermatologic / Skin Assessment (not related to wound area) ASSESSMENTS - Focused Assessment []  - Circumferential Edema Measurements - multi extremities 0 []  - 0 Nutritional Assessment / Counseling / Intervention X- 1 5 Lower Extremity Assessment (monofilament, tuning fork, pulses) []  - 0 Peripheral Arterial Disease Assessment (using hand held doppler) ASSESSMENTS - Ostomy and/or Continence Assessment and Care []  - Incontinence Assessment and Management 0 []  - 0 Ostomy Care Assessment and Management (repouching, etc.) PROCESS - Coordination of Care X - Simple Patient / Family Education for ongoing care 1 15 []  - 0 Complex (extensive) Patient / Family Education for ongoing care []  - 0 Staff obtains Programmer, systems, Records, Test Results / Process Orders []  - 0 Staff telephones HHA, Nursing Homes / Clarify orders / etc []  - 0 Routine Transfer to another Facility (non-emergent condition) []  - 0 Routine Hospital Admission (non-emergent condition) []  - 0 New Admissions / Biomedical engineer / Ordering NPWT, Apligraf, etc. []  - 0 Emergency Hospital Admission (emergent condition) X- 1 10 Simple Discharge Coordination []  - 0 Complex (extensive) Discharge Coordination PROCESS - Special Needs []  - Pediatric / Minor Patient Management 0 []  - 0 Isolation Patient Management []  - 0 Hearing / Language / Visual special needs []  - 0 Assessment of Community assistance (transportation, D/C planning, etc.) []  - 0 Additional assistance / Altered mentation []  - 0 Support Surface(s) Assessment (bed,  cushion, seat, etc.) INTERVENTIONS - Wound Cleansing / Measurement Joyce [name], [name] ([id number]) []  - 0 Simple Wound Cleansing - one wound X- 3 5 Complex Wound Cleansing -  multiple wounds []  - 0 Wound Imaging (photographs - any number of wounds) []  - 0 Wound Tracing (instead of photographs) []  - 0 Simple Wound Measurement - one wound X- 3 5 Complex Wound Measurement - multiple wounds INTERVENTIONS - Wound Dressings []  - Small Wound Dressing one or multiple wounds 0 X- 3 15 Medium Wound Dressing one or multiple wounds []  - 0 Large Wound Dressing one or multiple wounds []  - 0 Application of Medications - topical []  - 0 Application of Medications - injection INTERVENTIONS - Miscellaneous []  - External ear exam 0 []  - 0 Specimen Collection (cultures, biopsies, blood, body fluids, etc.) []  - 0 Specimen(s) / Culture(s) sent or taken to Lab for analysis []  - 0 Patient Transfer (multiple staff / Civil Service fast streamer / Similar devices) []  - 0 Simple Staple / Suture removal (25 or less) []  - 0 Complex Staple / Suture removal (26 or more) []  - 0 Hypo / Hyperglycemic Management (close monitor of Blood Glucose) []  - 0 Ankle / Brachial Index (ABI) - do not check if billed separately X- 1 5 Vital Signs Has the patient been seen at the hospital within the last three years: Yes Total Score: 140 Level Of Care: New/Established - Level 4 Electronic Signature(s) Signed: 07/04/2020 4:32:20 PM By: Joyce [name] Entered By: Joyce [name] on 07/04/2020 10:05:09 Joyce [name], [name] ([id number]) -------------------------------------------------------------------------------- Compression Therapy Details Patient Name: Joyce [name], [name] Date of Service: 07/04/2020 8:30 AM Medical Record Number: [id number] Patient Account Number: [id number] Date of Birth/Sex: [birth year] (86 y.o. F) Treating RN: Joyce [name] Primary Care Otho Michalik: [name], Joyce [name] Other Clinician: Referring Kasha Howeth: [name],  Joyce [name] Treating Debie Ashline/Extender: Joyce [name] [name] in Treatment: 1 Compression Therapy Performed for Wound Assessment: Wound #5 Right,Medial Lower Leg Performed By: Clinician Joyce Canavan, RN Compression Type: Three Layer Post Procedure Diagnosis Same as Pre-procedure Electronic Signature(s) Signed: 07/04/2020 4:32:20 PM By: Joyce [name] Entered By: Joyce [name] on 07/04/2020 10:04:06 Joyce [name], [name] ([id number]) -------------------------------------------------------------------------------- Encounter Discharge Information Details Patient Name: Joyce [name], [name] Date of Service: 07/04/2020 8:30 AM Medical Record Number: [id number] Patient Account Number: [id number] Date of Birth/Sex: [birth year] (86 y.o. F) Treating RN: Joyce [name] Primary Care Lamaj Metoyer: [name], Joyce [name] Other Clinician: Referring Anmol Paschen: [name], Joyce [name] Treating Ezariah Nace/Extender: Sharalyn Ink in Treatment: 1 Encounter Discharge Information Items Discharge Condition: Stable Ambulatory Status: Walker Discharge Destination: Home Transportation: Private Auto Accompanied By: daughter Schedule Follow-up Appointment: Yes Clinical Summary of Care: Electronic Signature(s) Signed: 07/04/2020 4:32:20 PM By: Joyce [name] Entered By: Joyce [name] on 07/04/2020 10:05:52 Ransom, Milton. ([id number]) -------------------------------------------------------------------------------- Lower Extremity Assessment Details Patient Name: Joyce [name], [name] Date of Service: 07/04/2020 8:30 AM Medical Record Number: [id number] Patient Account Number: [id number] Date of Birth/Sex: Aug 03, 1928 (86 y.o. F) Treating RN: Joyce [name] Primary Care Caileigh Canche: [name], Joyce [name] Other Clinician: Referring Gazella Anglin: [name], RICHARD Treating Mayleigh Tetrault/Extender: Joyce [name] [name] in Treatment: 1 Edema Assessment Assessed: [Left: Yes] [Right: Yes] Edema: [Left: Yes] [Right: Yes] Calf Left:  Right: Point of Measurement: 22 cm From Medial Instep 31 cm 30 cm Ankle Left: Right: Point of Measurement: 12 cm From Medial Instep 22.5 cm 23.5 cm Vascular Assessment Pulses: Dorsalis Pedis Palpable: [Left:Yes] [Right:Yes] Posterior Tibial Palpable: [Left:Yes] [Right:Yes] Electronic Signature(s) Signed: 07/04/2020 11:30:22 AM By: Darci Needle Signed: 07/05/2020 11:13:21 AM By: Gretta Cool, BSN, RN, CWS, Kim RN, BSN Entered By: Darci Needle on 07/04/2020 09:41:27 Joyce [name], [name] ([id number]) -------------------------------------------------------------------------------- Multi Wound Chart Details Patient Name: Joyce [name], [name] Date of Service: 07/04/2020  8:30 AM Medical Record Number: [id number] Patient Account Number: [id number] Date of Birth/Sex: February 12, 1928 (86 y.o. F) Treating RN: Joyce [name] Primary Care Alyxis Grippi: [name], Joyce [name] Other Clinician: Referring Zoe Creasman: [name], Joyce [name] Treating Seniya Stoffers/Extender: Joyce [name] [name] in Treatment: 1 Vital Signs Height(in): Pulse(bpm): 98 Weight(lbs): Blood Pressure(mmHg): 132/70 Body Mass Index(BMI): Temperature(F): 98.2 Respiratory Rate(breaths/min): 16 Photos: Wound Location: Medial Sacrum Right, Medial Lower Leg Right Gluteus Wounding Event: Gradually Appeared Gradually Appeared Pressure Injury Primary Etiology: Pressure Ulcer Venous Leg Ulcer Pressure Ulcer Comorbid History: Chronic Obstructive Pulmonary Chronic Obstructive Pulmonary Chronic Obstructive Pulmonary Disease (COPD), History of pressure Disease (COPD), History of pressure Disease (COPD), History of pressure wounds, Osteoarthritis wounds, Osteoarthritis wounds, Osteoarthritis Date Acquired: 09/21/2018 06/24/2020 06/27/2020 [name] of Treatment: 1 1 0 Wound Status: Open Open Open Measurements L x W x D (cm) 2.2x2x1.5 1x0.5x0.1 1.5x0.6x0.1 Area (cm) : 3.456 0.393 0.707 Volume (cm) : 5.184 0.039 0.071 % Reduction in Area: -120.00% 66.60% 0.00% %  Reduction in Volume: -230.00% 66.90% 90.00% Classification: Category/Stage III Partial Thickness Category/Stage II Exudate Amount: Medium Medium Small Exudate Type: Serosanguineous Serosanguineous N/A Exudate Color: red, brown red, brown N/A Wound Margin: Flat and Intact Distinct, outline attached N/A Granulation Amount: Large (67-100%) Small (1-33%) Small (1-33%) Granulation Quality: Red Red Pink Necrotic Amount: None Present (0%) Medium (34-66%) Large (67-100%) Exposed Structures: Fat Layer (Subcutaneous Tissue): N/A Fat Layer (Subcutaneous Tissue): Yes Yes Fascia: No Fascia: No Tendon: No Tendon: No Muscle: No Muscle: No Joint: No Joint: No Bone: No Bone: No Epithelialization: None None Small (1-33%) Treatment Notes Electronic Signature(s) Signed: 07/04/2020 4:32:20 PM By: Joyce [name] Entered By: Joyce [name] on 07/04/2020 09:58:10 Joyce [name], Joyce [name] ([id number]) -------------------------------------------------------------------------------- [place] Details Patient Name: Holstrom, Augustine L. Date of Service: 07/04/2020 8:30 AM Medical Record Number: [id number] Patient Account Number: [id number] Date of Birth/Sex: [birth year] (86 y.o. F) Treating RN: Joyce [name] Primary Care Keziyah Kneale: [name], Joyce [name] Other Clinician: Referring Damione Robideau: [name], Joyce [name] Treating Blue Winther/Extender: Sharalyn Ink in Treatment: 1 Active Inactive Necrotic Tissue Nursing Diagnoses: Impaired tissue integrity related to necrotic/devitalized tissue Goals: Necrotic/devitalized tissue will be minimized in the wound bed Date Initiated: 06/27/2020 Target Resolution Date: 07/04/2020 Goal Status: Active Interventions: Assess patient pain level pre-, during and post procedure and prior to discharge Treatment Activities: Apply topical anesthetic as ordered : 06/27/2020 Notes: Orientation to the Wound Care Program Nursing Diagnoses: Knowledge deficit related  to the wound healing center program Goals: Patient/caregiver will verbalize understanding of the [place] Date Initiated: 06/27/2020 Target Resolution Date: 06/27/2020 Goal Status: Active Interventions: Provide education on orientation to the wound center Notes: Pressure Nursing Diagnoses: Knowledge deficit related to management of pressures ulcers Goals: Patient will remain free from development of additional pressure ulcers Date Initiated: 06/27/2020 Target Resolution Date: 07/04/2020 Goal Status: Active Patient/caregiver will verbalize understanding of pressure ulcer management Date Initiated: 06/27/2020 Target Resolution Date: 07/04/2020 Goal Status: Active Interventions: Assess: immobility, friction, shearing, incontinence upon admission and as needed Notes: Venous Leg Ulcer Nursing Diagnoses: Actual venous Insuffiency (use after diagnosis is confirmed) Abercrombie, Linetta L. ([id number]) Goals: Patient will maintain optimal edema control Date Initiated: 06/27/2020 Target Resolution Date: 07/04/2020 Goal Status: Active Interventions: Assess peripheral edema status every visit. Treatment Activities: Therapeutic compression applied : 06/27/2020 Notes: Wound/Skin Impairment Nursing Diagnoses: Impaired tissue integrity Goals: Ulcer/skin breakdown will have a volume reduction of 30% by week 4 Date Initiated: 06/27/2020 Target Resolution Date: 07/29/2011 Goal Status: Active Ulcer/skin breakdown will have a volume reduction of  50% by week 8 Date Initiated: 06/27/2020 Target Resolution Date: 08/27/2020 Goal Status: Active Interventions: Provide education on ulcer and skin care Treatment Activities: Skin care regimen initiated : 06/27/2020 Notes: Electronic Signature(s) Signed: 07/04/2020 4:32:20 PM By: Joyce [name] Entered By: Joyce [name] on 07/04/2020 09:58:02 Joyce [name], Joyce [name]  ([id number]) -------------------------------------------------------------------------------- Pain Assessment Details Patient Name: Joyce [name], [name] Date of Service: 07/04/2020 8:30 AM Medical Record Number: [id number] Patient Account Number: [id number] Date of Birth/Sex: [birth year] (86 y.o. F) Treating RN: Joyce [name] Primary Care Treg [name]: [name], Joyce [name] Other Clinician: Referring Montgomery Rothlisberger: [name] Treating Burdell Peed/Extender: Joyce [name] [name] in Treatment: 1 Active Problems Location of Pain Severity and Description of Pain Patient Has Paino No Site Locations With Dressing Change: No Pain Management and Medication Current Pain Management: Electronic Signature(s) Signed: 07/04/2020 11:30:22 AM By: Darci Needle Signed: 07/05/2020 11:13:21 AM By: Gretta Cool, BSN, RN, CWS, Kim RN, BSN Entered By: Darci Needle on 07/04/2020 09:15:54 [place]lands, Joyce [name] ([id number]) -------------------------------------------------------------------------------- Patient/Caregiver Education Details Patient Name: Sen, Yaneth L. Date of Service: 07/04/2020 8:30 AM Medical Record Number: [id number] Patient Account Number: [id number] Date of Birth/Gender: 02/18/1928 (86 y.o. F) Treating RN: Joyce [name] Primary Care Physician: [name], Joyce [name] Other Clinician: Referring Physician: [name] Treating Physician/Extender: Sharalyn Ink in Treatment: 1 Education Assessment Education Provided To: Patient Education Topics Provided Wound/Skin Impairment: Handouts: Caring for Your Ulcer Methods: Explain/Verbal Responses: State content correctly Electronic Signature(s) Signed: 07/04/2020 4:32:20 PM By: Joyce [name] Entered By: Joyce [name] on 07/04/2020 09:58:41 Udall, Mykiah L. ([id number]) -------------------------------------------------------------------------------- Wound Assessment Details Patient Name: Spiker, Kelce L. Date of Service: 07/04/2020 8:30  AM Medical Record Number: [id number] Patient Account Number: [id number] Date of Birth/Sex: [birth year] (86 y.o. F) Treating RN: Joyce [name] Primary Care Emanuel Dowson: [name], Joyce [name] Other Clinician: Referring Derril Franek: [name], Joyce [name] Treating Mervil Wacker/Extender: Joyce [name] [name] in Treatment: 1 Wound Status Wound Number: 4 Primary Pressure Ulcer Etiology: Wound Location: Medial Sacrum Wound Open Wounding Event: Gradually Appeared Status: Date Acquired: 09/21/2018 Comorbid Chronic Obstructive Pulmonary Disease (COPD), History [name] Of Treatment: 1 History: of pressure wounds, Osteoarthritis Clustered Wound: No Photos Wound Measurements Length: (cm) 2.2 Width: (cm) 2 Depth: (cm) 1.5 Area: (cm) 3.456 Volume: (cm) 5.184 % Reduction in Area: -120% % Reduction in Volume: -230% Epithelialization: None Wound Description Classification: Category/Stage III Wound Margin: Flat and Intact Exudate Amount: Medium Exudate Type: Serosanguineous Exudate Color: red, brown Foul Odor After Cleansing: No Slough/Fibrino No Wound Bed Granulation Amount: Large (67-100%) Exposed Structure Granulation Quality: Red Fascia Exposed: No Necrotic Amount: None Present (0%) Fat Layer (Subcutaneous Tissue) Exposed: Yes Tendon Exposed: No Muscle Exposed: No Joint Exposed: No Bone Exposed: No Electronic Signature(s) Signed: 07/04/2020 11:30:22 AM By: Darci Needle Signed: 07/05/2020 11:13:21 AM By: Gretta Cool, BSN, RN, CWS, Kim RN, BSN Entered By: Darci Needle on 07/04/2020 09:35:57 Heidel, Xanthe L. ([id number]) -------------------------------------------------------------------------------- Wound Assessment Details Patient Name: Sura, Alyne L. Date of Service: 07/04/2020 8:30 AM Medical Record Number: [id number] Patient Account Number: [id number] Date of Birth/Sex: January 21, 1928 (86 y.o. F) Treating RN: Joyce [name] Primary Care Nazar Kuan: [name], Joyce [name] Other Clinician: Referring  Noe Pittsley: [name], Joyce [name] Treating Mont Jagoda/Extender: Joyce [name] [name] in Treatment: 1 Wound Status Wound Number: 5 Primary Venous Leg Ulcer Etiology: Wound Location: Right, Medial Lower Leg Wound Open Wounding Event: Gradually Appeared Status: Date Acquired: 06/24/2020 Comorbid Chronic Obstructive Pulmonary Disease (COPD), History [name] Of Treatment: 1 History: of pressure wounds, Osteoarthritis Clustered Wound: No Photos Wound Measurements Length: (cm) 1 Width: (cm)  0.5 Depth: (cm) 0.1 Area: (cm) 0.393 Volume: (cm) 0.039 % Reduction in Area: 66.6% % Reduction in Volume: 66.9% Epithelialization: None Wound Description Classification: Partial Thickness Wound Margin: Distinct, outline attached Exudate Amount: Medium Exudate Type: Serosanguineous Exudate Color: red, brown Foul Odor After Cleansing: No Slough/Fibrino No Wound Bed Granulation Amount: Small (1-33%) Granulation Quality: Red Necrotic Amount: Medium (34-66%) Necrotic Quality: Adherent Therapist, music) Signed: 07/04/2020 11:30:22 AM By: Darci Needle Signed: 07/05/2020 11:13:21 AM By: Gretta Cool, BSN, RN, CWS, Kim RN, BSN Entered By: Darci Needle on 07/04/2020 09:36:25 Tweten, Aarianna L. ([id number]) -------------------------------------------------------------------------------- Wound Assessment Details Patient Name: Melendrez, Christian L. Date of Service: 07/04/2020 8:30 AM Medical Record Number: [id number] Patient Account Number: [id number] Date of Birth/Sex: October 06, 1928 (86 y.o. F) Treating RN: Joyce [name] Primary Care Islay Polanco: [name], Joyce [name] Other Clinician: Referring Aaylah Pokorny: [name], Joyce [name] Treating Lewayne Pauley/Extender: Joyce [name] [name] in Treatment: 1 Wound Status Wound Number: 6 Primary Pressure Ulcer Etiology: Wound Location: Right Gluteus Wound Open Wounding Event: Pressure Injury Status: Date Acquired: 06/27/2020 Comorbid Chronic Obstructive Pulmonary Disease  (COPD), History [name] Of Treatment: 0 History: of pressure wounds, Osteoarthritis Clustered Wound: No Photos Wound Measurements Length: (cm) 1.5 Width: (cm) 0.6 Depth: (cm) 0.1 Area: (cm) 0.707 Volume: (cm) 0.071 % Reduction in Area: 0% % Reduction in Volume: 90% Epithelialization: Small (1-33%) Tunneling: No Undermining: No Wound Description Classification: Category/Stage II Exudate Amount: Small Foul Odor After Cleansing: No Slough/Fibrino Yes Wound Bed Granulation Amount: Small (1-33%) Exposed Structure Granulation Quality: Pink Fascia Exposed: No Necrotic Amount: Large (67-100%) Fat Layer (Subcutaneous Tissue) Exposed: Yes Necrotic Quality: Adherent Slough Tendon Exposed: No Muscle Exposed: No Joint Exposed: No Bone Exposed: No Electronic Signature(s) Signed: 07/04/2020 11:30:22 AM By: Darci Needle Signed: 07/05/2020 11:13:21 AM By: Gretta Cool, BSN, RN, CWS, Kim RN, BSN Entered By: Darci Needle on 07/04/2020 09:40:32 Koontz [name], Lomax ([id number]) -------------------------------------------------------------------------------- Vitals Details Patient Name: Colonna, Shawndrea L. Date of Service: 07/04/2020 8:30 AM Medical Record Number: [id number] Patient Account Number: [id number] Date of Birth/Sex: October 09, 1928 (86 y.o. F) Treating RN: Joyce [name] Primary Care Marquise Lambson: [name], Joyce [name] Other Clinician: Referring Vernie Vinciguerra: [name], Joyce [name] Treating Isom Kochan/Extender: Joyce [name] [name] in Treatment: 1 Vital Signs Time Taken: 08:45 Temperature (F): 98.2 Pulse (bpm): 98 Respiratory Rate (breaths/min): 16 Blood Pressure (mmHg): 132/70 Reference Range: 80 - 120 mg / dl Electronic Signature(s) Signed: 07/05/2020 3:55:47 PM By: Joyce [name] Joyce [name] Entered By: Joyce [name] on 07/04/2020 08:48:00

## 2020-07-07 ENCOUNTER — Other Ambulatory Visit: Payer: Self-pay | Admitting: Family Medicine

## 2020-07-07 DIAGNOSIS — L89153 Pressure ulcer of sacral region, stage 3: Secondary | ICD-10-CM | POA: Diagnosis not present

## 2020-07-07 DIAGNOSIS — I872 Venous insufficiency (chronic) (peripheral): Secondary | ICD-10-CM | POA: Diagnosis not present

## 2020-07-07 DIAGNOSIS — L97812 Non-pressure chronic ulcer of other part of right lower leg with fat layer exposed: Secondary | ICD-10-CM | POA: Diagnosis not present

## 2020-07-07 DIAGNOSIS — J449 Chronic obstructive pulmonary disease, unspecified: Secondary | ICD-10-CM | POA: Diagnosis not present

## 2020-07-07 DIAGNOSIS — I890 Lymphedema, not elsewhere classified: Secondary | ICD-10-CM | POA: Diagnosis not present

## 2020-07-07 DIAGNOSIS — I10 Essential (primary) hypertension: Secondary | ICD-10-CM | POA: Diagnosis not present

## 2020-07-17 DIAGNOSIS — I10 Essential (primary) hypertension: Secondary | ICD-10-CM | POA: Diagnosis not present

## 2020-07-17 DIAGNOSIS — I890 Lymphedema, not elsewhere classified: Secondary | ICD-10-CM | POA: Diagnosis not present

## 2020-07-17 DIAGNOSIS — L97812 Non-pressure chronic ulcer of other part of right lower leg with fat layer exposed: Secondary | ICD-10-CM | POA: Diagnosis not present

## 2020-07-17 DIAGNOSIS — I872 Venous insufficiency (chronic) (peripheral): Secondary | ICD-10-CM | POA: Diagnosis not present

## 2020-07-17 DIAGNOSIS — L89153 Pressure ulcer of sacral region, stage 3: Secondary | ICD-10-CM | POA: Diagnosis not present

## 2020-07-17 DIAGNOSIS — J449 Chronic obstructive pulmonary disease, unspecified: Secondary | ICD-10-CM | POA: Diagnosis not present

## 2020-07-18 ENCOUNTER — Ambulatory Visit: Payer: Medicare Other | Admitting: Physician Assistant

## 2020-07-27 ENCOUNTER — Other Ambulatory Visit: Payer: Self-pay | Admitting: Family Medicine

## 2020-07-27 DIAGNOSIS — J449 Chronic obstructive pulmonary disease, unspecified: Secondary | ICD-10-CM | POA: Diagnosis not present

## 2020-07-27 DIAGNOSIS — L89153 Pressure ulcer of sacral region, stage 3: Secondary | ICD-10-CM | POA: Diagnosis not present

## 2020-07-27 DIAGNOSIS — I10 Essential (primary) hypertension: Secondary | ICD-10-CM | POA: Diagnosis not present

## 2020-07-27 DIAGNOSIS — I872 Venous insufficiency (chronic) (peripheral): Secondary | ICD-10-CM | POA: Diagnosis not present

## 2020-07-27 DIAGNOSIS — L97812 Non-pressure chronic ulcer of other part of right lower leg with fat layer exposed: Secondary | ICD-10-CM | POA: Diagnosis not present

## 2020-07-27 DIAGNOSIS — I890 Lymphedema, not elsewhere classified: Secondary | ICD-10-CM | POA: Diagnosis not present

## 2020-07-29 DIAGNOSIS — J449 Chronic obstructive pulmonary disease, unspecified: Secondary | ICD-10-CM | POA: Diagnosis not present

## 2020-07-29 DIAGNOSIS — I890 Lymphedema, not elsewhere classified: Secondary | ICD-10-CM | POA: Diagnosis not present

## 2020-07-29 DIAGNOSIS — L97812 Non-pressure chronic ulcer of other part of right lower leg with fat layer exposed: Secondary | ICD-10-CM | POA: Diagnosis not present

## 2020-07-29 DIAGNOSIS — L89153 Pressure ulcer of sacral region, stage 3: Secondary | ICD-10-CM | POA: Diagnosis not present

## 2020-07-29 DIAGNOSIS — I872 Venous insufficiency (chronic) (peripheral): Secondary | ICD-10-CM | POA: Diagnosis not present

## 2020-07-29 DIAGNOSIS — I10 Essential (primary) hypertension: Secondary | ICD-10-CM | POA: Diagnosis not present

## 2020-07-31 ENCOUNTER — Telehealth: Payer: Self-pay

## 2020-07-31 NOTE — Telephone Encounter (Signed)
Palliative care SW outreached patient's daighter to complete telephonic visit. Daughter provided update on medical condition and/or changes. Daughter shared that patient has been doing well. Patient reports no current pain. No recent falls. Patient is eating well with no recent weight loss. Daughter states that patient has been coughing a bit more now due to weather change. Upping nebulizer usage. No medication changes. Patient sleeping well.Patient has [place] RN for wound care with [facility] 2x/wk. Patient no longer going to wound care clinic.Daughter declined hospital bed at the moment. Patient see's Dr. Rosanna Randy 11/17 and will discuss hospital bed option in more detail with him at that time. Anxiety is a factor in changing her current ways. Patient appreciative of telephonic check in. Daughter had no further questions of SW.Palliative care will continue to monitor and assist with long term care planning as needed.

## 2020-08-01 DIAGNOSIS — I890 Lymphedema, not elsewhere classified: Secondary | ICD-10-CM | POA: Diagnosis not present

## 2020-08-01 DIAGNOSIS — L97812 Non-pressure chronic ulcer of other part of right lower leg with fat layer exposed: Secondary | ICD-10-CM | POA: Diagnosis not present

## 2020-08-01 DIAGNOSIS — I10 Essential (primary) hypertension: Secondary | ICD-10-CM | POA: Diagnosis not present

## 2020-08-01 DIAGNOSIS — J449 Chronic obstructive pulmonary disease, unspecified: Secondary | ICD-10-CM | POA: Diagnosis not present

## 2020-08-01 DIAGNOSIS — I872 Venous insufficiency (chronic) (peripheral): Secondary | ICD-10-CM | POA: Diagnosis not present

## 2020-08-01 DIAGNOSIS — L89153 Pressure ulcer of sacral region, stage 3: Secondary | ICD-10-CM | POA: Diagnosis not present

## 2020-08-04 DIAGNOSIS — J449 Chronic obstructive pulmonary disease, unspecified: Secondary | ICD-10-CM | POA: Diagnosis not present

## 2020-08-04 DIAGNOSIS — L97812 Non-pressure chronic ulcer of other part of right lower leg with fat layer exposed: Secondary | ICD-10-CM | POA: Diagnosis not present

## 2020-08-04 DIAGNOSIS — I890 Lymphedema, not elsewhere classified: Secondary | ICD-10-CM | POA: Diagnosis not present

## 2020-08-04 DIAGNOSIS — I872 Venous insufficiency (chronic) (peripheral): Secondary | ICD-10-CM | POA: Diagnosis not present

## 2020-08-04 DIAGNOSIS — I10 Essential (primary) hypertension: Secondary | ICD-10-CM | POA: Diagnosis not present

## 2020-08-04 DIAGNOSIS — L89153 Pressure ulcer of sacral region, stage 3: Secondary | ICD-10-CM | POA: Diagnosis not present

## 2020-08-07 DIAGNOSIS — Z23 Encounter for immunization: Secondary | ICD-10-CM | POA: Diagnosis not present

## 2020-08-08 ENCOUNTER — Other Ambulatory Visit: Payer: Self-pay | Admitting: Family Medicine

## 2020-08-08 DIAGNOSIS — L97812 Non-pressure chronic ulcer of other part of right lower leg with fat layer exposed: Secondary | ICD-10-CM | POA: Diagnosis not present

## 2020-08-08 DIAGNOSIS — I872 Venous insufficiency (chronic) (peripheral): Secondary | ICD-10-CM | POA: Diagnosis not present

## 2020-08-08 DIAGNOSIS — L89153 Pressure ulcer of sacral region, stage 3: Secondary | ICD-10-CM | POA: Diagnosis not present

## 2020-08-08 DIAGNOSIS — J449 Chronic obstructive pulmonary disease, unspecified: Secondary | ICD-10-CM | POA: Diagnosis not present

## 2020-08-08 DIAGNOSIS — I10 Essential (primary) hypertension: Secondary | ICD-10-CM | POA: Diagnosis not present

## 2020-08-08 DIAGNOSIS — I890 Lymphedema, not elsewhere classified: Secondary | ICD-10-CM | POA: Diagnosis not present

## 2020-08-08 NOTE — Telephone Encounter (Signed)
Requested Prescriptions  Pending Prescriptions Disp Refills   ADVAIR DISKUS 250-50 MCG/DOSE AEPB [Pharmacy Med Name: ADVAIR DISKUS 250/50MCG (YELLOW) 60] 60 each 12    Sig: INHALE 1 PUFF INTO THE LUNGS TWICE DAILY     Pulmonology:  Combination Products Passed - 08/08/2020  3:10 AM      Passed - Valid encounter within last 12 months    Recent Outpatient Visits          2 months ago Generalized anxiety disorder   [facility] Jerrol Banana., MD   4 months ago Generalized anxiety disorder   [facility] Jerrol Banana., MD   9 months ago Sacral decubitus ulcer, stage III [facility])   [facility] Flinchum, Kelby Aline, FNP   10 months ago Blister of toe of left foot with infection, initial encounter   [facility] Flinchum, Kelby Aline, FNP   10 months ago Essential hypertension   [facility] Jerrol Banana., MD      Future Appointments            In 1 month Jerrol Banana., MD [facility], [place]

## 2020-08-11 DIAGNOSIS — I872 Venous insufficiency (chronic) (peripheral): Secondary | ICD-10-CM | POA: Diagnosis not present

## 2020-08-11 DIAGNOSIS — L97812 Non-pressure chronic ulcer of other part of right lower leg with fat layer exposed: Secondary | ICD-10-CM | POA: Diagnosis not present

## 2020-08-11 DIAGNOSIS — L89153 Pressure ulcer of sacral region, stage 3: Secondary | ICD-10-CM | POA: Diagnosis not present

## 2020-08-11 DIAGNOSIS — J449 Chronic obstructive pulmonary disease, unspecified: Secondary | ICD-10-CM | POA: Diagnosis not present

## 2020-08-11 DIAGNOSIS — I10 Essential (primary) hypertension: Secondary | ICD-10-CM | POA: Diagnosis not present

## 2020-08-11 DIAGNOSIS — I890 Lymphedema, not elsewhere classified: Secondary | ICD-10-CM | POA: Diagnosis not present

## 2020-08-15 DIAGNOSIS — J449 Chronic obstructive pulmonary disease, unspecified: Secondary | ICD-10-CM | POA: Diagnosis not present

## 2020-08-15 DIAGNOSIS — I10 Essential (primary) hypertension: Secondary | ICD-10-CM | POA: Diagnosis not present

## 2020-08-15 DIAGNOSIS — I872 Venous insufficiency (chronic) (peripheral): Secondary | ICD-10-CM | POA: Diagnosis not present

## 2020-08-15 DIAGNOSIS — L97812 Non-pressure chronic ulcer of other part of right lower leg with fat layer exposed: Secondary | ICD-10-CM | POA: Diagnosis not present

## 2020-08-15 DIAGNOSIS — L89153 Pressure ulcer of sacral region, stage 3: Secondary | ICD-10-CM | POA: Diagnosis not present

## 2020-08-15 DIAGNOSIS — I890 Lymphedema, not elsewhere classified: Secondary | ICD-10-CM | POA: Diagnosis not present

## 2020-08-21 ENCOUNTER — Other Ambulatory Visit: Payer: Self-pay | Admitting: Family Medicine

## 2020-08-21 ENCOUNTER — Telehealth: Payer: Self-pay

## 2020-08-21 DIAGNOSIS — I10 Essential (primary) hypertension: Secondary | ICD-10-CM | POA: Diagnosis not present

## 2020-08-21 DIAGNOSIS — L97812 Non-pressure chronic ulcer of other part of right lower leg with fat layer exposed: Secondary | ICD-10-CM | POA: Diagnosis not present

## 2020-08-21 DIAGNOSIS — I872 Venous insufficiency (chronic) (peripheral): Secondary | ICD-10-CM | POA: Diagnosis not present

## 2020-08-21 DIAGNOSIS — J449 Chronic obstructive pulmonary disease, unspecified: Secondary | ICD-10-CM | POA: Diagnosis not present

## 2020-08-21 DIAGNOSIS — I890 Lymphedema, not elsewhere classified: Secondary | ICD-10-CM | POA: Diagnosis not present

## 2020-08-21 DIAGNOSIS — L89153 Pressure ulcer of sacral region, stage 3: Secondary | ICD-10-CM | POA: Diagnosis not present

## 2020-08-21 NOTE — Telephone Encounter (Signed)
441 pm.  Message received from the office requesting a call back to [name]-daughter of patient.  Return call made to [facility] who wanted to provide an update on patient's condition.  She is reporting patient sustained a fall yesterday in the bathroom.  This is the 1st fall in 1 1/2 years.  Daughter is also notes an increase in memory deficits. Patient is currently being seeing by home health due to a "wound on her bottom".   She is seeking further support due to caregiver fatigue and questioning if patient might be hospice appropriate.  Appointment is scheduled for next Wednesday at 1030 am.

## 2020-08-22 ENCOUNTER — Telehealth: Payer: Self-pay

## 2020-08-22 NOTE — Telephone Encounter (Signed)
Copied from [place] [phone]. Topic: General - Other >> Aug 22, 2020 10:22 AM Oneta Rack wrote:  Osvaldo Human name: [place]  Relation to pt: [facility]  Call back number: [phone]   Reason for call: decrease orders to 1x a week for for wound care

## 2020-08-22 NOTE — Telephone Encounter (Signed)
Please advise 

## 2020-08-23 DIAGNOSIS — Z23 Encounter for immunization: Secondary | ICD-10-CM | POA: Diagnosis not present

## 2020-08-23 NOTE — Telephone Encounter (Signed)
ok 

## 2020-08-23 NOTE — Telephone Encounter (Signed)
Verbal order was given. 

## 2020-08-25 DIAGNOSIS — I10 Essential (primary) hypertension: Secondary | ICD-10-CM | POA: Diagnosis not present

## 2020-08-25 DIAGNOSIS — L89153 Pressure ulcer of sacral region, stage 3: Secondary | ICD-10-CM | POA: Diagnosis not present

## 2020-08-25 DIAGNOSIS — I890 Lymphedema, not elsewhere classified: Secondary | ICD-10-CM | POA: Diagnosis not present

## 2020-08-25 DIAGNOSIS — I872 Venous insufficiency (chronic) (peripheral): Secondary | ICD-10-CM | POA: Diagnosis not present

## 2020-08-25 DIAGNOSIS — L97812 Non-pressure chronic ulcer of other part of right lower leg with fat layer exposed: Secondary | ICD-10-CM | POA: Diagnosis not present

## 2020-08-25 DIAGNOSIS — J449 Chronic obstructive pulmonary disease, unspecified: Secondary | ICD-10-CM | POA: Diagnosis not present

## 2020-08-28 DIAGNOSIS — J449 Chronic obstructive pulmonary disease, unspecified: Secondary | ICD-10-CM | POA: Diagnosis not present

## 2020-08-28 DIAGNOSIS — Z9181 History of falling: Secondary | ICD-10-CM | POA: Diagnosis not present

## 2020-08-28 DIAGNOSIS — I10 Essential (primary) hypertension: Secondary | ICD-10-CM | POA: Diagnosis not present

## 2020-08-28 DIAGNOSIS — L89153 Pressure ulcer of sacral region, stage 3: Secondary | ICD-10-CM | POA: Diagnosis not present

## 2020-08-28 DIAGNOSIS — Z4800 Encounter for change or removal of nonsurgical wound dressing: Secondary | ICD-10-CM | POA: Diagnosis not present

## 2020-08-28 DIAGNOSIS — I872 Venous insufficiency (chronic) (peripheral): Secondary | ICD-10-CM | POA: Diagnosis not present

## 2020-08-28 DIAGNOSIS — I890 Lymphedema, not elsewhere classified: Secondary | ICD-10-CM | POA: Diagnosis not present

## 2020-08-29 DIAGNOSIS — I890 Lymphedema, not elsewhere classified: Secondary | ICD-10-CM | POA: Diagnosis not present

## 2020-08-29 DIAGNOSIS — I10 Essential (primary) hypertension: Secondary | ICD-10-CM | POA: Diagnosis not present

## 2020-08-29 DIAGNOSIS — I872 Venous insufficiency (chronic) (peripheral): Secondary | ICD-10-CM | POA: Diagnosis not present

## 2020-08-29 DIAGNOSIS — Z4800 Encounter for change or removal of nonsurgical wound dressing: Secondary | ICD-10-CM | POA: Diagnosis not present

## 2020-08-29 DIAGNOSIS — J449 Chronic obstructive pulmonary disease, unspecified: Secondary | ICD-10-CM | POA: Diagnosis not present

## 2020-08-29 DIAGNOSIS — L89153 Pressure ulcer of sacral region, stage 3: Secondary | ICD-10-CM | POA: Diagnosis not present

## 2020-08-30 ENCOUNTER — Other Ambulatory Visit: Payer: Self-pay

## 2020-08-30 ENCOUNTER — Other Ambulatory Visit: Payer: Medicare Other

## 2020-08-30 VITALS — BP 132/62 | HR 80 | Resp 32

## 2020-08-30 DIAGNOSIS — Z515 Encounter for palliative care: Secondary | ICD-10-CM

## 2020-08-30 NOTE — Progress Notes (Signed)
COMMUNITY PALLIATIVE CARE SW NOTE  PATIENT NAME: Joyce [name] DOB: [birth year] MRN: [id number]  PRIMARY CARE PROVIDER: Jerrol Banana., MD  RESPONSIBLE PARTY:  Acct ID - Guarantor Home Phone Work Phone Relationship Acct Type  [id number] HASINI, [id number]  Self P/F     [address], [place], [place] [address]     PLAN OF CARE and INTERVENTIONS:             1. GOALS OF CARE/ ADVANCE CARE PLANNING:  Patient is a DNR. [name] is HCPOA. Patients goal is to avoid hospitalizations and remain in the home with daughter and SIL. 2. SOCIAL/EMOTIONAL/SPIRITUAL ASSESSMENT/ INTERVENTIONS:  SW and RN met with patient and daughter, [name] in daughters home for monthly palliative care visit. Patient resides with daughter and SIL in a one story home. Daughter is primary caregiver. Patient sitting in recliner in her room during visit. Daughter updated SW and RN and patients medical condition/changes. Per daughter she is noticing more memory deficits in patient and patient is weaker and not coming out of room as much any more. SW and RN discussed long term care planning with daughter - additional care in the home vs placement and payment for each setting as well as Hospice services. Patient shared that she has been doing fine. Patient eating well and drinks boost supplements as well, current weight is 120lbs. Patient sleeps well, continues to sleep in recliner stating it's comfortable for her and helps her breathing as well since she is sitting/lying up right.  Daughter shares that patient had a fall a couple weeks ago with no injuries, patient uses rollator with ambulation. Patient has [facility] RN coming once a week to dress stage lll wound to sacral area. RN reviewed medication and vitals and encouraged patient to use nebulizer more often. Patient and daughter had not other concerns for SW. discussed goals, reviewed care plan, provided emotional support, used active and reflective  listening. 3. PATIENT/CAREGIVER EDUCATION/ COPING:  Patient A&O and engaged in discussion. Patient shared that her anxiety surfaces mostly in the morning after waking up, states that she wakes up almost in a startled state and that triggers her anxiety. Patient takes lorazepam as needed. Daughter or SIL are supportive. 4. PERSONAL EMERGENCY PLAN:  Family to call 911 for emergencies. 5. COMMUNITY RESOURCES COORDINATION/ HEALTH CARE NAVIGATION:  Daughter manages care. Patient has [facility] nurse for wound. Daughter has contacts for personal caregivers should she need to go out of town that can be with patient. 6. FINANCIAL/LEGAL CONCERNS/INTERVENTIONS:  None.       SOCIAL HX:  Social History   Tobacco Use  . Smoking status: Never Smoker  . Smokeless tobacco: Never Used  Substance Use Topics  . Alcohol use: Not Currently    CODE STATUS: DNR ADVANCED DIRECTIVES: Y MOST FORM COMPLETE:  Y HOSPICE EDUCATION PROVIDED: Y  PPS: Patient is ambulatory with rollator. Patient is MIN-A with all ADL's. She is able to feedself.  Time spent: 1 hour      [place], [place]

## 2020-08-30 NOTE — Progress Notes (Signed)
PATIENT NAME: Joyce [name] DOB: [birth year] MRN: [id number]  PRIMARY CARE PROVIDER: Jerrol Banana., MD  RESPONSIBLE PARTY:  Acct ID - Guarantor Home Phone Work Phone Relationship Acct Type  [id number] Joyce [name], [phone]  Self P/F     [address] [place], [place] [address]    PLAN OF CARE and INTERVENTIONS:               1.  GOALS OF CARE/ ADVANCE CARE PLANNING:  Daughter would like to keep patient home for as long as possible.  Currently has a DNR in place.               2.  PATIENT/CAREGIVER EDUCATION:  Medication safety, Hospice vs Palliative Care               5.  DISEASE STATUS: Greeted at the door by daughter Joyce [name].  We discussed changes that daughter has noted in the last couple of months.  Increase weakness noted and increase in memory loss. Patient has been forgetful about taking her medications and when her last meal was.  I have offered some advice about medication safety given patient is having some episodes of forgetfulness.  We discussed only providing a couple of lorazepam a day or doing a pill count to ensure patient is not taking more than ordered.  We have discussed goals of care and options has patient declines.  I have reviewed the differences between Palliative Care and Hospice and advised that we would be monitoring for patient's appropriateness for hospice services.  Actively listening provided as daughter shared concerns and caregiver stress.  Patient is found in her room sitting in the recliner chair.  She is alert and engaging in conservation. Noted shortness of breath with talking.  Pursed lip breathing with ambulation but patient is able to recover within 5 minutes of resting.  No O2 is in use.  She does have a productive cough and reports a pale or clear sputum production that has been ongoing.  She is using an inhaler.  I have provided education on using her albuterol nebulizer more regularly to help with breathing.  She notes that she is only using  the nebulizer on a PRN basis that is very rare. Patient notes improvement with breathing after she did a breathing treatment.  She will begin using this in the am and possibly pm.   Appetite has been fair.  Weight remains stable at 120 lbs and patient was weighed today.  She will occasionally fix her own breakfast and lunch.  Daughter is present to assist and often makes dinner.  There is some swallowing issues and patient is eating yogurt to take her medications.  Patient is using a rollator to ambulate safely within the home.  She did have a fall on last Sunday with no injuries.  Patient does report weakness to her legs today.  She mostly remains in her recliner chair.       HISTORY OF PRESENT ILLNESS:  86 year old female with history of COPD.  She is being followed by Palliative Care monthly and PRN.  CODE STATUS:  DNR ADVANCED DIRECTIVES: Yes MOST FORM: No PPS: 50%   PHYSICAL EXAM:   VITALS:There were no vitals filed for this visit.  LUNGS: Left lower lobe with rhonchi. CARDIAC: HRR EXTREMITIES: 2+ pitting edema to bilateral lower extremities. SKIN: Stage 3 sacral wound being managed weekly by [facility].  Skin warm and dry to touch. NEURO: Alert and oriented. +  for forgetfulness.       Lorenza Burton, RN

## 2020-09-01 ENCOUNTER — Other Ambulatory Visit: Payer: Self-pay | Admitting: Family Medicine

## 2020-09-06 DIAGNOSIS — Z4800 Encounter for change or removal of nonsurgical wound dressing: Secondary | ICD-10-CM | POA: Diagnosis not present

## 2020-09-06 DIAGNOSIS — I872 Venous insufficiency (chronic) (peripheral): Secondary | ICD-10-CM | POA: Diagnosis not present

## 2020-09-06 DIAGNOSIS — I890 Lymphedema, not elsewhere classified: Secondary | ICD-10-CM | POA: Diagnosis not present

## 2020-09-06 DIAGNOSIS — L89153 Pressure ulcer of sacral region, stage 3: Secondary | ICD-10-CM | POA: Diagnosis not present

## 2020-09-06 DIAGNOSIS — I10 Essential (primary) hypertension: Secondary | ICD-10-CM | POA: Diagnosis not present

## 2020-09-06 DIAGNOSIS — J449 Chronic obstructive pulmonary disease, unspecified: Secondary | ICD-10-CM | POA: Diagnosis not present

## 2020-09-09 ENCOUNTER — Other Ambulatory Visit: Payer: Self-pay | Admitting: Family Medicine

## 2020-09-10 ENCOUNTER — Other Ambulatory Visit: Payer: Self-pay | Admitting: Family Medicine

## 2020-09-12 DIAGNOSIS — Z4800 Encounter for change or removal of nonsurgical wound dressing: Secondary | ICD-10-CM | POA: Diagnosis not present

## 2020-09-12 DIAGNOSIS — L89153 Pressure ulcer of sacral region, stage 3: Secondary | ICD-10-CM | POA: Diagnosis not present

## 2020-09-12 DIAGNOSIS — I872 Venous insufficiency (chronic) (peripheral): Secondary | ICD-10-CM | POA: Diagnosis not present

## 2020-09-12 DIAGNOSIS — I10 Essential (primary) hypertension: Secondary | ICD-10-CM | POA: Diagnosis not present

## 2020-09-12 DIAGNOSIS — I890 Lymphedema, not elsewhere classified: Secondary | ICD-10-CM | POA: Diagnosis not present

## 2020-09-12 DIAGNOSIS — J449 Chronic obstructive pulmonary disease, unspecified: Secondary | ICD-10-CM | POA: Diagnosis not present

## 2020-09-19 DIAGNOSIS — L89153 Pressure ulcer of sacral region, stage 3: Secondary | ICD-10-CM | POA: Diagnosis not present

## 2020-09-19 DIAGNOSIS — I890 Lymphedema, not elsewhere classified: Secondary | ICD-10-CM | POA: Diagnosis not present

## 2020-09-19 DIAGNOSIS — J449 Chronic obstructive pulmonary disease, unspecified: Secondary | ICD-10-CM | POA: Diagnosis not present

## 2020-09-19 DIAGNOSIS — I10 Essential (primary) hypertension: Secondary | ICD-10-CM | POA: Diagnosis not present

## 2020-09-19 DIAGNOSIS — Z4800 Encounter for change or removal of nonsurgical wound dressing: Secondary | ICD-10-CM | POA: Diagnosis not present

## 2020-09-19 DIAGNOSIS — I872 Venous insufficiency (chronic) (peripheral): Secondary | ICD-10-CM | POA: Diagnosis not present

## 2020-09-20 ENCOUNTER — Other Ambulatory Visit: Payer: Self-pay

## 2020-09-20 ENCOUNTER — Encounter: Payer: Self-pay | Admitting: Family Medicine

## 2020-09-20 ENCOUNTER — Ambulatory Visit (INDEPENDENT_AMBULATORY_CARE_PROVIDER_SITE_OTHER): Payer: Medicare Other | Admitting: Family Medicine

## 2020-09-20 VITALS — BP 126/74 | HR 86 | Temp 98.5°F | Resp 18 | Ht 60.0 in | Wt 119.0 lb

## 2020-09-20 DIAGNOSIS — E440 Moderate protein-calorie malnutrition: Secondary | ICD-10-CM | POA: Diagnosis not present

## 2020-09-20 DIAGNOSIS — R413 Other amnesia: Secondary | ICD-10-CM | POA: Diagnosis not present

## 2020-09-20 DIAGNOSIS — F411 Generalized anxiety disorder: Secondary | ICD-10-CM | POA: Diagnosis not present

## 2020-09-20 DIAGNOSIS — Z853 Personal history of malignant neoplasm of breast: Secondary | ICD-10-CM | POA: Diagnosis not present

## 2020-09-20 DIAGNOSIS — I10 Essential (primary) hypertension: Secondary | ICD-10-CM

## 2020-09-20 DIAGNOSIS — L89153 Pressure ulcer of sacral region, stage 3: Secondary | ICD-10-CM

## 2020-09-20 DIAGNOSIS — I77810 Thoracic aortic ectasia: Secondary | ICD-10-CM

## 2020-09-20 DIAGNOSIS — J479 Bronchiectasis, uncomplicated: Secondary | ICD-10-CM

## 2020-09-20 DIAGNOSIS — R634 Abnormal weight loss: Secondary | ICD-10-CM

## 2020-09-20 NOTE — Progress Notes (Signed)
I,April Wisenbaker,acting as a scribe for [name], MD.,have documented all relevant documentation on the behalf of [name], MD,as directed by  [name], MD while in the presence of [name], MD.   Established patient visit   Patient: Joyce [name]   DOB: 1928/04/20   86 y.o. Female  MRN: [id number] Visit Date: 09/20/2020  Today's healthcare provider: [name], MD   Chief Complaint  Patient presents with  . Anxiety  . Follow-up  . Hypertension   Subjective    HPI  Call patient states she feels well and has no complaints. Hypertension, follow-up  BP Readings from Last 3 Encounters:  09/20/20 126/74  08/30/20 132/62  07/03/20 122/64   Wt Readings from Last 3 Encounters:  09/20/20 119 lb (54 kg)  07/03/20 120 lb (54.4 kg)  05/16/20 118 lb (53.5 kg)     She was last seen for hypertension 4 months ago.  BP at that visit was 126/64. Management since that visit includes; well controlled. She reports good compliance with treatment. She is not having side effects. none She is not exercising. She is adherent to low salt diet.   Outside blood pressures are normal.  She does not smoke.  Use of agents associated with hypertension: none.   --------------------------------------------------------------------  Anxiety, Follow-up  She was last seen for anxiety 4 months ago. Changes made at last visit include; Cautioned about a potential fall risk. No changes in medications will recheck in four months.    She reports good compliance with treatment. She reports good tolerance of treatment. She is not having side effects. none  She feels her anxiety is mild and Improved since last visit.  Symptoms: No chest pain No difficulty concentrating  No dizziness Yes fatigue  No feelings of losing control No insomnia  No irritable No palpitations  No panic attacks No racing thoughts  No shortness of breath No sweating  No  tremors/shakes    GAD-7 Results No flowsheet data found.  PHQ-9 Scores PHQ9 SCORE ONLY 09/20/2020 06/07/2020 06/02/2019  PHQ-9 Total Score 0 0 0    --------------------------------------------------------------------       Medications: Outpatient Medications Prior to Visit  Medication Sig  . ADVAIR DISKUS 250-50 MCG/DOSE AEPB INHALE 1 PUFF BY MOUTH TWICE DAILY. RINSE MOUTH WITH WATER AFTER USE TO REDUCE AFTERTASTE AND INCIDENCE OF CANDIDIASIS. DO NOT SWALLOW  . albuterol (PROVENTIL) (2.5 MG/3ML) 0.083% nebulizer solution USE 1 VIAL VIA NEBULIZER EVERY 6 HOURS AS NEEDED FOR WHEEZING OR SHORTNESS OF BREATH  . atorvastatin (LIPITOR) 10 MG tablet TAKE 1 TABLET BY MOUTH DAILY  . calcium carbonate (TUMS - DOSED IN MG ELEMENTAL CALCIUM) 500 MG chewable tablet Chew 2 tablets by mouth daily.   Marland Kitchen DILT-XR 120 MG 24 hr capsule TAKE 2 CAPSULES BY MOUTH EVERY MORNING AND 1 CAPSULE EVERY EVENING  . feeding supplement, ENSURE ENLIVE, (ENSURE ENLIVE) LIQD Take 237 mLs by mouth 2 (two) times daily between meals.  Marland Kitchen levothyroxine (SYNTHROID) 100 MCG tablet TAKE 1 TABLET(100 MCG) BY MOUTH DAILY  . LORazepam (ATIVAN) 0.5 MG tablet TAKE 1/2 TABLET(0.25 MG) BY MOUTH TWICE DAILY AS NEEDED FOR ANXIETY  . mirtazapine (REMERON) 30 MG tablet TAKE 1 TABLET(30 MG) BY MOUTH AT BEDTIME  . Multiple Vitamin (MULTIVITAMIN WITH MINERALS) TABS tablet Take 1 tablet by mouth daily.  Marland Kitchen acetaminophen (TYLENOL) 325 MG tablet Take 650 mg by mouth 2 (two) times daily. Taking two tablets every morning. (Patient not taking:  Reported on 09/20/2020)  . acidophilus (RISAQUAD) CAPS capsule Take 1 capsule by mouth daily. (Patient not taking: Reported on 06/07/2020)  . ADVAIR DISKUS 250-50 MCG/DOSE AEPB INHALE 1 PUFF INTO THE LUNGS TWICE DAILY  . alendronate (FOSAMAX) 70 MG tablet TAKE 1 TABLET BY MOUTH ONCE A WEEK WITH A FULL GLASS OF WATER ON AN EMPTY STOMACH (Patient not taking: Reported on 09/20/2020)  . cholecalciferol (VITAMIN D) 1000  units tablet Take 2,000 Units by mouth daily. (Patient not taking: Reported on 09/20/2020)  . clotrimazole (CLOTRIMAZOLE ATHLETES FOOT) 1 % cream Apply 1 application topically 2 (two) times daily. Between toes, not on any open skin/ for fungus (Patient not taking: Reported on 06/07/2020)  . diltiazem (CARDIZEM CD) 120 MG 24 hr capsule TK 2 CS PO QAM AND 1 C QPM (Patient not taking: Reported on 06/07/2020)  . doxycycline (VIBRA-TABS) 100 MG tablet Take 1 tablet (100 mg total) by mouth 2 (two) times daily. (Patient not taking: Reported on 03/14/2020)  . levofloxacin (LEVAQUIN) 500 MG tablet Take 1 tablet (500 mg total) by mouth daily. (Patient not taking: Reported on 03/14/2020)  . mupirocin ointment (BACTROBAN) 2 % Apply 1 application topically 2 (two) times daily. Apply very small thin amount after cleaning with soap and water. (Patient not taking: Reported on 03/14/2020)   No facility-administered medications prior to visit.    Review of Systems  Constitutional: Negative for appetite change, chills, fatigue and fever.  Respiratory: Negative for chest tightness and shortness of breath.   Cardiovascular: Negative for chest pain and palpitations.  Gastrointestinal: Negative for abdominal pain, nausea and vomiting.  Neurological: Negative for dizziness and weakness.       Objective    BP 126/74 (BP Location: Right Arm, Patient Position: Sitting, Cuff Size: Normal)   Pulse 86   Temp 98.5 F (36.9 C) (Oral)   Resp 18   Ht 5' (1.524 m)   Wt 119 lb (54 kg)   SpO2 93%   BMI 23.24 kg/m  BP Readings from Last 3 Encounters:  09/20/20 126/74  08/30/20 132/62  07/03/20 122/64   Wt Readings from Last 3 Encounters:  09/20/20 119 lb (54 kg)  07/03/20 120 lb (54.4 kg)  05/16/20 118 lb (53.5 kg)      Physical Exam Vitals reviewed.  Constitutional:      Appearance: She is not toxic-appearing.  HENT:     Head: Normocephalic and atraumatic.     Right Ear: External ear normal.     Left Ear:  External ear normal.  Eyes:     General: No scleral icterus.    Conjunctiva/sclera: Conjunctivae normal.  Cardiovascular:     Rate and Rhythm: Normal rate and regular rhythm.     Heart sounds: Murmur heard.      Comments: 2/6 systolic murmur at left lower sternal border Pulmonary:     Effort: Pulmonary effort is normal.  Abdominal:     Palpations: Abdomen is soft.  Skin:    General: Skin is warm and dry.  Neurological:     General: No focal deficit present.     Mental Status: She is alert and oriented to person, place, and time. Mental status is at baseline.  Psychiatric:        Mood and Affect: Mood normal.        Behavior: Behavior normal.        Thought Content: Thought content normal.        Judgment: Judgment normal.  No results found for any visits on 09/20/20.  Assessment & Plan     1. Bronchiectasis without acute exacerbation ([place] Ackley stable.  2. Essential hypertension Excellent control.  3. Generalized anxiety disorder Ackley stable  4. Personal history of malignant neoplasm of breast Clinically in this 86 year old  5. Weight loss Having some malnutrition due to to her extensive weight loss.  She was over 300 pounds several years ago.  6. Memory loss or impairment Stable.  Check at time of wellness visit  7. Sacral decubitus ulcer, stage III (HCC) Stable and improved per patient and daughter 8. Ascending aorta dilatation (HCC)   9. Malnutrition of moderate degree (HCC) Follow-up in about 4 months.   No follow-ups on file.         Jawaan Adachi [name], MD  [facility] [phone] (phone) [phone] (fax)  [place]

## 2020-09-26 ENCOUNTER — Telehealth: Payer: Self-pay

## 2020-09-26 NOTE — Telephone Encounter (Signed)
1020 am.  Phone call made to [facility] to follow up on patient's condition.  Joyce [name] reports patient had a recent MD visit.  She is doing very well right now.   No issues with her appetite.  Recent weight is 119 lbs.  Respiratory status is doing quite well.  Patient is using her nebulizer every other day for shortness of breath.  Sacral wound is about 1/2 the size as it was 2 months ago.  This area has been ongoing for about 2 years now.  Daughter reports no new concerns at this time.  Advised that PC would contact her again next month for a visit.

## 2020-09-27 DIAGNOSIS — I890 Lymphedema, not elsewhere classified: Secondary | ICD-10-CM | POA: Diagnosis not present

## 2020-09-27 DIAGNOSIS — I872 Venous insufficiency (chronic) (peripheral): Secondary | ICD-10-CM | POA: Diagnosis not present

## 2020-09-27 DIAGNOSIS — L89153 Pressure ulcer of sacral region, stage 3: Secondary | ICD-10-CM | POA: Diagnosis not present

## 2020-09-27 DIAGNOSIS — Z4800 Encounter for change or removal of nonsurgical wound dressing: Secondary | ICD-10-CM | POA: Diagnosis not present

## 2020-09-27 DIAGNOSIS — Z9181 History of falling: Secondary | ICD-10-CM | POA: Diagnosis not present

## 2020-09-27 DIAGNOSIS — I10 Essential (primary) hypertension: Secondary | ICD-10-CM | POA: Diagnosis not present

## 2020-09-27 DIAGNOSIS — J449 Chronic obstructive pulmonary disease, unspecified: Secondary | ICD-10-CM | POA: Diagnosis not present

## 2020-10-03 DIAGNOSIS — I872 Venous insufficiency (chronic) (peripheral): Secondary | ICD-10-CM | POA: Diagnosis not present

## 2020-10-03 DIAGNOSIS — L89153 Pressure ulcer of sacral region, stage 3: Secondary | ICD-10-CM | POA: Diagnosis not present

## 2020-10-03 DIAGNOSIS — J449 Chronic obstructive pulmonary disease, unspecified: Secondary | ICD-10-CM | POA: Diagnosis not present

## 2020-10-03 DIAGNOSIS — I890 Lymphedema, not elsewhere classified: Secondary | ICD-10-CM | POA: Diagnosis not present

## 2020-10-03 DIAGNOSIS — Z4800 Encounter for change or removal of nonsurgical wound dressing: Secondary | ICD-10-CM | POA: Diagnosis not present

## 2020-10-03 DIAGNOSIS — I10 Essential (primary) hypertension: Secondary | ICD-10-CM | POA: Diagnosis not present

## 2020-10-16 ENCOUNTER — Other Ambulatory Visit: Payer: Self-pay | Admitting: Adult Health

## 2020-10-16 DIAGNOSIS — L89153 Pressure ulcer of sacral region, stage 3: Secondary | ICD-10-CM

## 2020-10-16 DIAGNOSIS — R0989 Other specified symptoms and signs involving the circulatory and respiratory systems: Secondary | ICD-10-CM

## 2020-10-16 DIAGNOSIS — R634 Abnormal weight loss: Secondary | ICD-10-CM

## 2020-10-24 ENCOUNTER — Other Ambulatory Visit: Payer: Self-pay | Admitting: Family Medicine

## 2020-10-24 DIAGNOSIS — F411 Generalized anxiety disorder: Secondary | ICD-10-CM

## 2020-10-30 ENCOUNTER — Telehealth: Payer: Self-pay

## 2020-10-30 NOTE — Telephone Encounter (Signed)
10:13AM: Palliative care SW outreached patient to complete telephonic visit.   Palliative care SW outreached patient to complete telephonic visit. Patient's daughter, Lynden Ang, provided update on medical condition and/or changes. Daughter shared that patient is still doing quite well physically but that she is having more difficulty with her memory and is considering placement in an ALF for patient. Daughter is having to remind patient to do small things that she use to be able to do on her own 2 months ago (using nebulizer treatments, putting her les up, reminding her to shower, overlooking her meal prep, etc). Daughter states that COPD is still present and is doing nebulizer treatments QOD. HH RN is no longer coming to address wound on sacral, states that wound is at a place that they no longer to come to the home. No current pain reported. No recent falls. Patient's appetite is the same and is at least 2 good meals a day. She has not loss any weight that daughter can tell. Patient denies financial, food insecurities or issues with transportation. No other psychosocial needs. Daughter appreciative of telephonic check in. In home visit scheduled for 11/14/20 @1030 .  Palliative care will continue to monitor and assist with long term care planning as needed.

## 2020-11-14 ENCOUNTER — Other Ambulatory Visit: Payer: Self-pay

## 2020-11-14 ENCOUNTER — Other Ambulatory Visit: Payer: Medicare Other

## 2020-11-14 VITALS — BP 120/60 | HR 87 | Temp 98.5°F | Wt 121.2 lb

## 2020-11-14 DIAGNOSIS — Z515 Encounter for palliative care: Secondary | ICD-10-CM

## 2020-11-14 NOTE — Progress Notes (Signed)
PATIENT NAME: Joyce [name] DOB: 1928-04-20 MRN: [id number]  PRIMARY CARE PROVIDER: Maple Hudson., MD  RESPONSIBLE PARTY:  Acct ID - Guarantor Home Phone Work Phone Relationship Acct Type  [id number] Joyce [name], Joyce [name]* [phone]  Self P/F     [address] [place], [place] [address]    PLAN OF CARE and INTERVENTIONS:               1.  GOALS OF CARE/ ADVANCE CARE PLANNING:  Daughter is considering ALF placement due to caregiver fatigue.               2.  PATIENT/CAREGIVER EDUCATION:  SNF vs ALF               4. PERSONAL EMERGENCY PLAN:  Activate 911 for emergencies               5.  DISEASE STATUS:  Joint visit completed with Joyce [name], SW.  Met with daughter Joyce [name] and her spouse before seeing patient.  Joyce [name] and her husband discussed the ongoing issues of caregiving, fatigue they are feeling and concern that patient may not be doing as much as she can.  Joyce [name] is asking about nursing homes and what this might look like.  Differences explained between SNF and ALF.  Joyce [name] states they had not thought about ALF and would like to explore this.  SW is able to explain medicaid and qualification process.  Daughter states patient does not come out of her room to sit with them in the living room.  When a private caregiver comes into the home, patient will sit in the living room when family is not present.  Daughter concerned that patient may feel like a burden.  Patient is found in her room in the recliner chair with the television on.  She is alert and engaging in conversation.  Patient enjoys talking about her life and travels.  She answers questions appropriately but needs some prompting from daughter when asked about pain.    Patient enjoys coloring and watching game shows.  She is ambulating to the bathroom and kitchen for meals and medications.  She continues with a rolling walker and gait is steady.  She requires no assistance getting out of her chair.  Dressing is completed on her own.   She does require supervision with showers.    Patient is very complimentary of her daughter and care that is being provided.  She often states she is very "blessed" to have the care that she does.  Patient states she does not want to interfere with her daughter and spouse.  She chooses to stay in her room for dinner and does not go into the living room to allow them private time.  Patient appears thinner in the face with temporal wasting noted.  She has actually gained 2 lbs since our last visit in November.  There continues to be edema present to her bilateral lower extremities.  Patient does not recline her chair to elevate her feet due to her COPD.  She feels most comfortable sitting upright and feels she breathes best in this position.  No O2 in use.  Nebulizers are being used on a prn basis.  Patient denies issues with insomnia.  She reports going to bed around 10-11 pm and awaking around 7-730 am.  She will take naps on rare occasions.  Pain is reported to coccyx area.  Patient states the area is tender.  She previously had a wound to  this area but this has improved significantly.  HH is no longer providing wound care and daughter/patient report no ointments or dressings to the area.  Given patient's overall condition she would likely be and excellent candidate for ALF.   HISTORY OF PRESENT ILLNESS:  87 year old female with hx of COPD.  Patient is being followed by Palliative Care monthly and PRN.  CODE STATUS: DNR ADVANCED DIRECTIVES: Yes MOST FORM: No PPS: 50%   PHYSICAL EXAM:   VITALS:  TEMP 98.5 F, BP 120/60, p 87,O2 sats 94% on RA. LUNGS: rhonchi present to lung fields. CARDIAC: HRR EXTREMITIES: 2+ bilateral lower extremities. SKIN: Jacqualin Combes and dry to touch. NEURO: Alert and oriented x 3       Lorenza Burton, RN

## 2020-11-14 NOTE — Progress Notes (Signed)
COMMUNITY PALLIATIVE CARE SW NOTE  PATIENT NAME: Joyce [name] DOB: 1928-03-04 MRN: [id number]  PRIMARY CARE PROVIDER: Jerrol Banana., MD  RESPONSIBLE PARTY:  Acct ID - Guarantor Home Phone Work Phone Relationship Acct Type  [id number] BRENNAH, [phone]  Self P/F     [address], [place], [place] [address]     PLAN OF CARE and INTERVENTIONS:             GOALS OF CARE/ ADVANCE CARE PLANNING: Patient is a DNR. [name] is HCPOA. Patients goal is to avoid hospitalizations and remain in the home with daughter and SIL. 2.         SOCIAL/EMOTIONAL/SPIRITUAL ASSESSMENT/ INTERVENTIONS:  SW and RN met with patient and daughter, [name] in daughters home for monthly palliative care visit. Patient resides with daughter and SIL in a one story home. Daughter is primary caregiver. Patient sitting in recliner in her room during visit.   Daughter updated SW and RN and patients medical condition/changes prior to seeing patient. Per daughter states she continues to have issues with caregiver fatigue, mainly due to patient not doing as much for herself as she possibly can. SW and RN discussed long term care planning with daughter - additional care in the home vs placement and payment for each setting. Patient shared that she has been doing fine. Patient eating well and drinks boost supplements as well, current weight is 121lbs. Patient sleeps well, continues to sleep in recliner stating it's comfortable for her and helps her breathing as well since she is sitting/lying up right.  No falls reported, patient uses rollator with ambulation. Patient has memory deficits of which she is aware of.  Patient shared that she feels she is doing well. She does not feel as though she is any weaker than before. Patient is happy that she has not loss any weight. Patient shared that she enjoys siting in her recliner in her room and watching TV and coloring. Patient shared that she still makes her own  breakfast. Patient shared that she does not go out to sit in the living room much due to her not wanting to intrude on daughters and SIL privacy. Patient shares that she walks around her room with her rollator.   RN reviewed medication and vitals and encouraged patient to use nebulizer more often. Patient continues to have area/wound on bottom. Patient says that she does not feel it is getting any worse. Patient and daughter were encouraged to outreach palliative care should area or pain in this area worsen.  SW has outreached POC with [employer] to gain more clarity on ALF Medicaid income guidelines and assistance with this process for patient. Patient and daughter had no other concerns for SW. discussed goals, reviewed care plan, provided emotional support, used active and reflective listening. 3.         PATIENT/CAREGIVER EDUCATION/ COPING:  Patient A&O and engaged in discussion. Patient shared that her anxiety surfaces mostly in the morning after waking up, states that she wakes up almost in a startled state and that triggers her anxiety. Patient takes lorazepam as needed. Daughter shares that she feels patient is taking lorazepam more often than she is relaying to [place]. Daughter or SIL are supportive. 4.         PERSONAL EMERGENCY PLAN:  Family to call 911 for emergencies. 5.         COMMUNITY RESOURCES COORDINATION/ HEALTH CARE NAVIGATION:  Daughter manages care. Patient has [facility] nurse for wound. Daughter has contacts for personal caregivers should she need to go out of town that can be with patient. 6.         FINANCIAL/LEGAL CONCERNS/INTERVENTIONS:  None.      SOCIAL HX:  Social History   Tobacco Use  . Smoking status: Never Smoker  . Smokeless tobacco: Never Used  Substance Use Topics  . Alcohol use: Not Currently    CODE STATUS: DNR ADVANCED DIRECTIVES: Y MOST FORM COMPLETE:  N HOSPICE EDUCATION PROVIDED: N  PPS: Patient is independent with ADL's. Patient is ambulatory  with rollator. Patient does require assistance with showers. Patient wears briefs but is mostly continent of bowel and bladder and able to take self to bathroom.    Time spent: 1hr 30 min   [place], [place]

## 2020-11-16 ENCOUNTER — Other Ambulatory Visit: Payer: Self-pay | Admitting: Family Medicine

## 2020-11-19 ENCOUNTER — Other Ambulatory Visit: Payer: Self-pay | Admitting: Family Medicine

## 2020-11-24 ENCOUNTER — Other Ambulatory Visit: Payer: Self-pay | Admitting: Family Medicine

## 2020-12-25 ENCOUNTER — Telehealth: Payer: Self-pay

## 2020-12-25 NOTE — Telephone Encounter (Signed)
10:10AM: Palliative care SW outreached patient/family for monthly telephonic visit.  SW left HIPPA complaint VM. Awaiting return call.  Will continue to offer palliative care support.

## 2021-01-16 NOTE — Progress Notes (Signed)
I,April Walen,acting as a scribe for [name], MD.,have documented all relevant documentation on the behalf of [name], MD,as directed by  [name], MD while in the presence of [name], MD.   Established patient visit   Patient: Joyce [name]   DOB: Mar 10, 1928   87 y.o. Female  MRN: [id number] Visit Date: 01/17/2021  Today's healthcare provider: [name], MD   Chief Complaint  Patient presents with  . Follow-up  . Hypertension   Subjective    HPI  Patient comes in today for follow-up.  Overall she is feeling fairly well.  She is having some memory loss as noted by the daughter. She does have a sacral decubitus which is not improving.  Hypertension, follow-up  BP Readings from Last 3 Encounters:  01/17/21 130/70  11/14/20 120/60  09/20/20 126/74   Wt Readings from Last 3 Encounters:  01/17/21 121 lb (54.9 kg)  11/14/20 121 lb 3.2 oz (55 kg)  09/20/20 119 lb (54 kg)     She was last seen for hypertension 4 months ago.  BP at that visit was 126/74. Management since that visit includes; Excellent control. She reports good compliance with treatment. She is not having side effects. none She is not exercising. She is adherent to low salt diet.   Outside blood pressures are notchecking.  She does not smoke.  Use of agents associated with hypertension: none.   --------------------------------------------------------------------  Bronchiectasis without acute exacerbation ([place] From 09/20/2020-[place] stable.  Weight loss From 09/20/2020-Having some malnutrition due to to her extensive weight loss.  She was over 300 pounds several years ago.  Memory loss or impairment From 09/20/2020-Stable.  Check at time of wellness visit.       Medications: Outpatient Medications Prior to Visit  Medication Sig  . acetaminophen (TYLENOL) 325 MG tablet Take 650 mg by mouth 2 (two) times daily. Taking two tablets every  morning.  Marland Kitchen ADVAIR DISKUS 250-50 MCG/DOSE AEPB INHALE 1 PUFF BY MOUTH TWICE DAILY. RINSE MOUTH WITH WATER AFTER USE TO REDUCE AFTERTASTE AND INCIDENCE OF CANDIDIASIS. DO NOT SWALLOW  . albuterol (PROVENTIL) (2.5 MG/3ML) 0.083% nebulizer solution USE 1 VIAL VIA NEBULIZER EVERY 6 HOURS AS NEEDED FOR WHEEZING OR SHORTNESS OF BREATH  . alendronate (FOSAMAX) 70 MG tablet TAKE 1 TABLET BY MOUTH ONCE A WEEK WITH A FULL GLASS OF WATER ON AN EMPTY STOMACH  . atorvastatin (LIPITOR) 10 MG tablet TAKE 1 TABLET BY MOUTH DAILY  . calcium carbonate (TUMS - DOSED IN MG ELEMENTAL CALCIUM) 500 MG chewable tablet Chew 2 tablets by mouth daily.   . cholecalciferol (VITAMIN D) 1000 units tablet Take 2,000 Units by mouth daily.  Marland Kitchen DILT-XR 120 MG 24 hr capsule TAKE 2 CAPSULES BY MOUTH EVERY MORNING AND 1 CAPSULE EVERY EVENING  . feeding supplement, ENSURE ENLIVE, (ENSURE ENLIVE) LIQD Take 237 mLs by mouth 2 (two) times daily between meals.  Marland Kitchen levothyroxine (SYNTHROID) 100 MCG tablet TAKE 1 TABLET(100 MCG) BY MOUTH DAILY  . LORazepam (ATIVAN) 0.5 MG tablet TAKE 1/2 TABLET(0.25 MG) BY MOUTH TWICE DAILY AS NEEDED FOR ANXIETY  . mirtazapine (REMERON) 30 MG tablet TAKE 1 TABLET(30 MG) BY MOUTH AT BEDTIME  . acidophilus (RISAQUAD) CAPS capsule Take 1 capsule by mouth daily. (Patient not taking: Reported on 06/07/2020)  . ADVAIR DISKUS 250-50 MCG/DOSE AEPB INHALE 1 PUFF INTO THE LUNGS TWICE DAILY  . clotrimazole (CLOTRIMAZOLE ATHLETES FOOT) 1 % cream Apply 1 application topically 2 (  two) times daily. Between toes, not on any open skin/ for fungus (Patient not taking: Reported on 06/07/2020)  . diltiazem (CARDIZEM CD) 120 MG 24 hr capsule TK 2 CS PO QAM AND 1 C QPM (Patient not taking: Reported on 06/07/2020)  . doxycycline (VIBRA-TABS) 100 MG tablet Take 1 tablet (100 mg total) by mouth 2 (two) times daily.  Marland Kitchen levofloxacin (LEVAQUIN) 500 MG tablet Take 1 tablet (500 mg total) by mouth daily.  . Multiple Vitamin (MULTIVITAMIN WITH  MINERALS) TABS tablet Take 1 tablet by mouth daily.  . mupirocin ointment (BACTROBAN) 2 % Apply 1 application topically 2 (two) times daily. Apply very small thin amount after cleaning with soap and water. (Patient not taking: Reported on 03/14/2020)   No facility-administered medications prior to visit.    Review of Systems  Constitutional: Negative for appetite change, chills, fatigue and fever.  Respiratory: Negative for chest tightness and shortness of breath.   Cardiovascular: Negative for chest pain and palpitations.  Gastrointestinal: Negative for abdominal pain, nausea and vomiting.  Neurological: Negative for dizziness and weakness.        Objective    BP 130/70 (BP Location: Left Arm, Patient Position: Sitting, Cuff Size: Normal)   Pulse 90   Temp 98.2 F (36.8 C) (Oral)   Resp 18   Ht 5' (1.524 m)   Wt 121 lb (54.9 kg)   SpO2 94%   BMI 23.63 kg/m  BP Readings from Last 3 Encounters:  01/17/21 130/70  11/14/20 120/60  09/20/20 126/74   Wt Readings from Last 3 Encounters:  01/17/21 121 lb (54.9 kg)  11/14/20 121 lb 3.2 oz (55 kg)  09/20/20 119 lb (54 kg)       Physical Exam Vitals reviewed.  Constitutional:      Appearance: She is not toxic-appearing.  HENT:     Head: Normocephalic and atraumatic.     Right Ear: External ear normal.     Left Ear: External ear normal.  Eyes:     General: No scleral icterus.    Conjunctiva/sclera: Conjunctivae normal.  Cardiovascular:     Rate and Rhythm: Normal rate and regular rhythm.     Heart sounds: Murmur heard.      Comments: 2/6 systolic murmur at left lower sternal border Pulmonary:     Effort: Pulmonary effort is normal.     Breath sounds: Rhonchi present.  Abdominal:     Palpations: Abdomen is soft.  Skin:    General: Skin is warm and dry.  Neurological:     General: No focal deficit present.     Mental Status: She is alert and oriented to person, place, and time. Mental status is at baseline.   Psychiatric:        Mood and Affect: Mood normal.        Behavior: Behavior normal.        Thought Content: Thought content normal.        Judgment: Judgment normal.      MMSE - Mini Mental State Exam 01/17/2021  Orientation to time 4  Orientation to Place 4  Registration 3  Attention/ Calculation 5  Recall 3  Language- name 2 objects 2  Language- repeat 1  Language- follow 3 step command 3  Language- read & follow direction 1  Write a sentence 1  Copy design 1  Total score 28    No results found for any visits on 01/17/21.  Assessment & Plan      1.  Essential hypertension Controlled. - Lipid panel - TSH - CBC w/Diff/Platelet - Comprehensive Metabolic Panel (CMET)  2. Mixed hyperlipidemia  - Lipid panel - TSH - CBC w/Diff/Platelet - Comprehensive Metabolic Panel (CMET)  3. Gastroesophageal reflux disease without esophagitis  - Lipid panel - TSH - CBC w/Diff/Platelet - Comprehensive Metabolic Panel (CMET)  4. Primary hyperparathyroidism (HCC)  - Lipid panel - TSH - CBC w/Diff/Platelet - Comprehensive Metabolic Panel (CMET)  5. Age-related osteoporosis without current pathological fracture  - DG Bone Density  6. Sacral decubitus ulcer, stage III [facility]) Patient and daughter declined referral to wound care clinic - Ambulatory referral to [place]  7. Malnutrition of moderate degree (HCC)   8. Personal history of malignant neoplasm of breast   9. Memory loss or impairment MMSE 28/30 today. Follow clinically.   Return in about 4 months (around 05/19/2021).      I, [name], MD, have reviewed all documentation for this visit. The documentation on 01/21/21 for the exam, diagnosis, procedures, and orders are all accurate and complete.    Richard [name], MD  [facility] [phone] (phone) [phone] (fax)  [place]

## 2021-01-17 ENCOUNTER — Ambulatory Visit (INDEPENDENT_AMBULATORY_CARE_PROVIDER_SITE_OTHER): Payer: Medicare Other | Admitting: Family Medicine

## 2021-01-17 ENCOUNTER — Other Ambulatory Visit: Payer: Self-pay

## 2021-01-17 ENCOUNTER — Encounter: Payer: Self-pay | Admitting: Family Medicine

## 2021-01-17 VITALS — BP 130/70 | HR 90 | Temp 98.2°F | Resp 18 | Ht 60.0 in | Wt 121.0 lb

## 2021-01-17 DIAGNOSIS — E210 Primary hyperparathyroidism: Secondary | ICD-10-CM | POA: Diagnosis not present

## 2021-01-17 DIAGNOSIS — M810 Age-related osteoporosis without current pathological fracture: Secondary | ICD-10-CM | POA: Diagnosis not present

## 2021-01-17 DIAGNOSIS — R413 Other amnesia: Secondary | ICD-10-CM | POA: Diagnosis not present

## 2021-01-17 DIAGNOSIS — Z853 Personal history of malignant neoplasm of breast: Secondary | ICD-10-CM

## 2021-01-17 DIAGNOSIS — L89153 Pressure ulcer of sacral region, stage 3: Secondary | ICD-10-CM

## 2021-01-17 DIAGNOSIS — I10 Essential (primary) hypertension: Secondary | ICD-10-CM

## 2021-01-17 DIAGNOSIS — E440 Moderate protein-calorie malnutrition: Secondary | ICD-10-CM | POA: Diagnosis not present

## 2021-01-17 DIAGNOSIS — K219 Gastro-esophageal reflux disease without esophagitis: Secondary | ICD-10-CM | POA: Diagnosis not present

## 2021-01-17 DIAGNOSIS — E782 Mixed hyperlipidemia: Secondary | ICD-10-CM

## 2021-01-17 NOTE — Patient Instructions (Addendum)
Stop Alendronate. Try egg crate seat.

## 2021-01-18 ENCOUNTER — Telehealth: Payer: Self-pay | Admitting: Family Medicine

## 2021-01-18 ENCOUNTER — Ambulatory Visit: Payer: Medicare Other | Admitting: Family Medicine

## 2021-01-18 NOTE — Telephone Encounter (Signed)
Tiffany, from [facility], calling stating that they were supposed to visit pt this Saturday, but pt is requesting to wait until Monday 01/22/21. Please advise.      [phone] opt 2 secure

## 2021-01-18 NOTE — Telephone Encounter (Signed)
Ok with me 

## 2021-01-19 NOTE — Telephone Encounter (Signed)
Tiffany was advised.

## 2021-01-20 ENCOUNTER — Other Ambulatory Visit: Payer: Self-pay | Admitting: Family Medicine

## 2021-01-20 DIAGNOSIS — F411 Generalized anxiety disorder: Secondary | ICD-10-CM

## 2021-01-20 NOTE — Telephone Encounter (Signed)
Requested Prescriptions  Pending Prescriptions Disp Refills  . mirtazapine (REMERON) 30 MG tablet [Pharmacy Med Name: MIRTAZAPINE 30MG  TABLETS] 90 tablet 0    Sig: TAKE 1 TABLET(30 MG) BY MOUTH AT BEDTIME     Psychiatry: Antidepressants - mirtazapine Passed - 01/20/2021  8:53 AM      Passed - AST in normal range and within 360 days    AST  Date Value Ref Range Status  05/16/2020 18 0 - 40 IU/L Final         Passed - ALT in normal range and within 360 days    ALT  Date Value Ref Range Status  05/16/2020 17 0 - 32 IU/L Final         Passed - Triglycerides in normal range and within 360 days    Triglycerides  Date Value Ref Range Status  05/16/2020 45 0 - 149 mg/dL Final         Passed - Total Cholesterol in normal range and within 360 days    Cholesterol, Total  Date Value Ref Range Status  05/16/2020 135 100 - 199 mg/dL Final         Passed - WBC in normal range and within 360 days    WBC  Date Value Ref Range Status  05/16/2020 10.8 3.4 - 10.8 x10E3/uL Final  02/15/2019 10.0 4.0 - 10.5 K/uL Final         Passed - Valid encounter within last 6 months    Recent Outpatient Visits          3 days ago Essential hypertension   [facility] Jerrol Banana., MD   4 months ago Bronchiectasis without acute exacerbation [facility])   [facility] Jerrol Banana., MD   8 months ago Generalized anxiety disorder   [facility] Jerrol Banana., MD   10 months ago Generalized anxiety disorder   [facility] Jerrol Banana., MD   1 year ago Sacral decubitus ulcer, stage III [facility])   [employer] Flinchum, Kelby Aline, FNP      Future Appointments            In 2 months Jerrol Banana., MD [facility], [place]

## 2021-01-22 ENCOUNTER — Telehealth: Payer: Self-pay

## 2021-01-22 NOTE — Telephone Encounter (Signed)
Copied from [place] [phone] [phone]. Topic: General - Other >> Jan 22, 2021  1:02 PM Tessa Lerner A wrote: Caller/Agency: Mardene Celeste / [facility] has made contact requesting new referral orders due to delay in patient's original orders  Patient's original orders came with instructions for them to be completed within 2-3 days but due to delay that was unable to be done  Please contact to advise further when possible

## 2021-01-23 ENCOUNTER — Telehealth: Payer: Self-pay | Admitting: Family Medicine

## 2021-01-23 ENCOUNTER — Telehealth: Payer: Self-pay

## 2021-01-23 DIAGNOSIS — E440 Moderate protein-calorie malnutrition: Secondary | ICD-10-CM | POA: Diagnosis not present

## 2021-01-23 DIAGNOSIS — I10 Essential (primary) hypertension: Secondary | ICD-10-CM | POA: Diagnosis not present

## 2021-01-23 DIAGNOSIS — J449 Chronic obstructive pulmonary disease, unspecified: Secondary | ICD-10-CM | POA: Diagnosis not present

## 2021-01-23 DIAGNOSIS — M810 Age-related osteoporosis without current pathological fracture: Secondary | ICD-10-CM | POA: Diagnosis not present

## 2021-01-23 DIAGNOSIS — Z4800 Encounter for change or removal of nonsurgical wound dressing: Secondary | ICD-10-CM | POA: Diagnosis not present

## 2021-01-23 DIAGNOSIS — L89153 Pressure ulcer of sacral region, stage 3: Secondary | ICD-10-CM

## 2021-01-23 DIAGNOSIS — E782 Mixed hyperlipidemia: Secondary | ICD-10-CM | POA: Diagnosis not present

## 2021-01-23 DIAGNOSIS — Z853 Personal history of malignant neoplasm of breast: Secondary | ICD-10-CM | POA: Diagnosis not present

## 2021-01-23 DIAGNOSIS — E210 Primary hyperparathyroidism: Secondary | ICD-10-CM | POA: Diagnosis not present

## 2021-01-23 DIAGNOSIS — R413 Other amnesia: Secondary | ICD-10-CM | POA: Diagnosis not present

## 2021-01-23 DIAGNOSIS — K219 Gastro-esophageal reflux disease without esophagitis: Secondary | ICD-10-CM | POA: Diagnosis not present

## 2021-01-23 DIAGNOSIS — Z9181 History of falling: Secondary | ICD-10-CM | POA: Diagnosis not present

## 2021-01-23 NOTE — Telephone Encounter (Signed)
Will refer to wound clinic in [facility]

## 2021-01-23 NOTE — Telephone Encounter (Signed)
Copied from [place] [phone]. Topic: General - Other >> Jan 23, 2021 12:29 PM Pawlus, Brayton Layman A wrote: Reason for CRM: Mardene Celeste from [facility] wanted to let Dr Rosanna Randy know the pt is no longer taking the following medications: clotrimazole (CLOTRIMAZOLE ATHLETES FOOT) 1 % cream doxycycline (VIBRA-TABS) 100 MG tablet levofloxacin (LEVAQUIN) 500 MG tablet Multiple Vitamin (MULTIVITAMIN WITH MINERALS) TABS tablet

## 2021-01-23 NOTE — Telephone Encounter (Signed)
Home Health Verbal Orders - Caller/Agency: [facility] Number: [phone] [id number]  [id number] OT/PT/Skilled Nursing/Social Work/Speech Therapy: Skilled Nursing Frequency: 1wk for 9x  Wound orders or referral to wound clinic in [place].

## 2021-01-23 NOTE — Telephone Encounter (Signed)
Saralyn Pilar as below. Referral for wound clinic in [employer] placed.

## 2021-01-23 NOTE — Telephone Encounter (Signed)
Please review. Thanks!  

## 2021-01-24 NOTE — Telephone Encounter (Signed)
ok 

## 2021-01-25 ENCOUNTER — Telehealth: Payer: Self-pay | Admitting: Family Medicine

## 2021-01-25 ENCOUNTER — Telehealth: Payer: Self-pay

## 2021-01-25 NOTE — Telephone Encounter (Signed)
Could consider dermatology referral if willing to take patient.   Ok to give orders as below as well. If worsening at anytime needs follow up with Dr. Rosanna Randy.

## 2021-01-25 NOTE — Telephone Encounter (Signed)
Joyce [name] with Palliative care states pt cannot get into the wound care clinic until 4/20 and there is no wound care going on for the stage 3 sacral wound. [facility] needs orders to go into the home for at least one time a week to do wound care.   For cleaning, and wet to dry pack. Wound seems to be tunneling.  Not sure how much insurance will pay, but once a week is better than nothing.  Will Dr Rosanna Randy be willing to give the orders? Nurse can come in and access.  But they cannot go in without orders.  cb [phone]

## 2021-01-25 NOTE — Telephone Encounter (Signed)
1145 am.  Phone call made to [facility] to follow up on patient's worsening sacral wound and home health orders.  [name] states the last home health visit was in December and at that time the wound was healing.  [name] stated she has not done any wound care nor is she able to look at this area.  There was a referral made for [place] but only an assessment visit was completed.  [name] states they now have a Wound Clinic visit scheduled for April 41OI but she is uncertain the patient will be able to go to this appointment.   It would be ideal for home health to see patient as her anxiety level and mobility have worsened.  Patient is mostly remaining in her room and [name] is taking meals to her.  On my last visit, patient was doing some simple meal prep on her own.  I have advised [name] that I would follow up with the PCP and [place] to see how we can further assist with wound care orders.    1222 pm.  Phone call made to [facility].  She notes the wound is tunneling and would need to be packed.  She does not have any wound care orders for patient at this time.  I have advised her of the referral to the wound clinic but the appointment is not until April 20 th.  [facility] would need wound care orders to see patient until she can be seen by the wound clinic.  At this time, it is requested to cleanse the wound and pack with a wet to dry dressing and cover with a dry dressing at least weekly.  Advised that I would contact the PCP to see if wound care orders could be given until patient is seen at the clinic.  205 pm.  Phone call made to PCP office and spoke with [name]-receptionist.  Advised of the need for wound care orders so [place] could see the patient until she was seen by the wound clinic next month.  Response pending.

## 2021-01-26 ENCOUNTER — Telehealth: Payer: Self-pay

## 2021-01-26 NOTE — Telephone Encounter (Signed)
39 am.  Incoming call from PCP office.  New orders were received from [facility], NP to be wound care to sacral wound weekly.  I have contacted Patrica, RN for [facility] and provided these orders.  She will contact patient next week to schedule her first visit.  1146 am.  Phone call made to [name]-daughter to advise of above orders and that Mardene Celeste, RN will be in touch with her next week to schedule a visit.  [name] voiced appreciation for follow up and and will await call from [facility].

## 2021-01-26 NOTE — Telephone Encounter (Signed)
Verbal orders were give to [employer] as below. KW

## 2021-01-30 DIAGNOSIS — J449 Chronic obstructive pulmonary disease, unspecified: Secondary | ICD-10-CM | POA: Diagnosis not present

## 2021-01-30 DIAGNOSIS — R413 Other amnesia: Secondary | ICD-10-CM | POA: Diagnosis not present

## 2021-01-30 DIAGNOSIS — E782 Mixed hyperlipidemia: Secondary | ICD-10-CM | POA: Diagnosis not present

## 2021-01-30 DIAGNOSIS — L89153 Pressure ulcer of sacral region, stage 3: Secondary | ICD-10-CM | POA: Diagnosis not present

## 2021-01-30 DIAGNOSIS — E440 Moderate protein-calorie malnutrition: Secondary | ICD-10-CM | POA: Diagnosis not present

## 2021-01-30 DIAGNOSIS — I10 Essential (primary) hypertension: Secondary | ICD-10-CM | POA: Diagnosis not present

## 2021-02-05 DIAGNOSIS — R413 Other amnesia: Secondary | ICD-10-CM | POA: Diagnosis not present

## 2021-02-05 DIAGNOSIS — I10 Essential (primary) hypertension: Secondary | ICD-10-CM | POA: Diagnosis not present

## 2021-02-05 DIAGNOSIS — E782 Mixed hyperlipidemia: Secondary | ICD-10-CM | POA: Diagnosis not present

## 2021-02-05 DIAGNOSIS — J449 Chronic obstructive pulmonary disease, unspecified: Secondary | ICD-10-CM | POA: Diagnosis not present

## 2021-02-05 DIAGNOSIS — L89153 Pressure ulcer of sacral region, stage 3: Secondary | ICD-10-CM | POA: Diagnosis not present

## 2021-02-05 DIAGNOSIS — E440 Moderate protein-calorie malnutrition: Secondary | ICD-10-CM | POA: Diagnosis not present

## 2021-02-12 DIAGNOSIS — E782 Mixed hyperlipidemia: Secondary | ICD-10-CM | POA: Diagnosis not present

## 2021-02-12 DIAGNOSIS — L89153 Pressure ulcer of sacral region, stage 3: Secondary | ICD-10-CM | POA: Diagnosis not present

## 2021-02-12 DIAGNOSIS — J449 Chronic obstructive pulmonary disease, unspecified: Secondary | ICD-10-CM | POA: Diagnosis not present

## 2021-02-12 DIAGNOSIS — E440 Moderate protein-calorie malnutrition: Secondary | ICD-10-CM | POA: Diagnosis not present

## 2021-02-12 DIAGNOSIS — I10 Essential (primary) hypertension: Secondary | ICD-10-CM | POA: Diagnosis not present

## 2021-02-12 DIAGNOSIS — R413 Other amnesia: Secondary | ICD-10-CM | POA: Diagnosis not present

## 2021-02-19 DIAGNOSIS — R413 Other amnesia: Secondary | ICD-10-CM | POA: Diagnosis not present

## 2021-02-19 DIAGNOSIS — J449 Chronic obstructive pulmonary disease, unspecified: Secondary | ICD-10-CM | POA: Diagnosis not present

## 2021-02-19 DIAGNOSIS — I10 Essential (primary) hypertension: Secondary | ICD-10-CM | POA: Diagnosis not present

## 2021-02-19 DIAGNOSIS — E440 Moderate protein-calorie malnutrition: Secondary | ICD-10-CM | POA: Diagnosis not present

## 2021-02-19 DIAGNOSIS — E782 Mixed hyperlipidemia: Secondary | ICD-10-CM | POA: Diagnosis not present

## 2021-02-19 DIAGNOSIS — L89153 Pressure ulcer of sacral region, stage 3: Secondary | ICD-10-CM | POA: Diagnosis not present

## 2021-02-21 ENCOUNTER — Encounter (HOSPITAL_BASED_OUTPATIENT_CLINIC_OR_DEPARTMENT_OTHER): Payer: Medicare Other | Admitting: Physician Assistant

## 2021-02-22 DIAGNOSIS — E782 Mixed hyperlipidemia: Secondary | ICD-10-CM | POA: Diagnosis not present

## 2021-02-22 DIAGNOSIS — E210 Primary hyperparathyroidism: Secondary | ICD-10-CM | POA: Diagnosis not present

## 2021-02-22 DIAGNOSIS — Z853 Personal history of malignant neoplasm of breast: Secondary | ICD-10-CM | POA: Diagnosis not present

## 2021-02-22 DIAGNOSIS — L89153 Pressure ulcer of sacral region, stage 3: Secondary | ICD-10-CM | POA: Diagnosis not present

## 2021-02-22 DIAGNOSIS — J449 Chronic obstructive pulmonary disease, unspecified: Secondary | ICD-10-CM | POA: Diagnosis not present

## 2021-02-22 DIAGNOSIS — R413 Other amnesia: Secondary | ICD-10-CM | POA: Diagnosis not present

## 2021-02-22 DIAGNOSIS — K219 Gastro-esophageal reflux disease without esophagitis: Secondary | ICD-10-CM | POA: Diagnosis not present

## 2021-02-22 DIAGNOSIS — M810 Age-related osteoporosis without current pathological fracture: Secondary | ICD-10-CM | POA: Diagnosis not present

## 2021-02-22 DIAGNOSIS — I10 Essential (primary) hypertension: Secondary | ICD-10-CM | POA: Diagnosis not present

## 2021-02-22 DIAGNOSIS — Z9181 History of falling: Secondary | ICD-10-CM | POA: Diagnosis not present

## 2021-02-22 DIAGNOSIS — E440 Moderate protein-calorie malnutrition: Secondary | ICD-10-CM | POA: Diagnosis not present

## 2021-02-22 DIAGNOSIS — Z4800 Encounter for change or removal of nonsurgical wound dressing: Secondary | ICD-10-CM | POA: Diagnosis not present

## 2021-02-25 ENCOUNTER — Other Ambulatory Visit: Payer: Self-pay | Admitting: Family Medicine

## 2021-02-25 NOTE — Telephone Encounter (Signed)
Requested Prescriptions  Pending Prescriptions Disp Refills  . levothyroxine (SYNTHROID) 100 MCG tablet [Pharmacy Med Name: LEVOTHYROXINE 0.100MG  (100MCG) TAB] 90 tablet 0    Sig: TAKE 1 TABLET(100 MCG) BY MOUTH DAILY     Endocrinology:  Hypothyroid Agents Failed - 02/25/2021 11:02 AM      Failed - TSH needs to be rechecked within 3 months after an abnormal result. Refill until TSH is due.      Failed - TSH in normal range and within 360 days    TSH  Date Value Ref Range Status  05/16/2020 7.720 (H) 0.450 - 4.500 uIU/mL Final         Passed - Valid encounter within last 12 months    Recent Outpatient Visits          1 month ago Essential hypertension   [facility] Jerrol Banana., MD   5 months ago Bronchiectasis without acute exacerbation [facility])   [facility] Jerrol Banana., MD   9 months ago Generalized anxiety disorder   [facility] Jerrol Banana., MD   11 months ago Generalized anxiety disorder   [facility] Jerrol Banana., MD   1 year ago Sacral decubitus ulcer, stage III [facility])   [place] Flinchum, Kelby Aline, FNP      Future Appointments            In 1 month Jerrol Banana., MD [facility], [place]

## 2021-02-26 ENCOUNTER — Other Ambulatory Visit: Payer: Medicare Other

## 2021-02-26 ENCOUNTER — Other Ambulatory Visit: Payer: Self-pay

## 2021-02-26 VITALS — BP 126/70 | HR 95 | Temp 98.2°F | Resp 18 | Wt 117.2 lb

## 2021-02-26 DIAGNOSIS — Z515 Encounter for palliative care: Secondary | ICD-10-CM

## 2021-02-26 NOTE — Progress Notes (Signed)
PATIENT NAME: Joyce [name] DOB: [birth year] MRN: [id number]  PRIMARY CARE PROVIDER: Jerrol Banana., MD  RESPONSIBLE PARTY:  Acct ID - Guarantor Home Phone Work Phone Relationship Acct Type  [id number] BRIYONNA, [phone]  Self P/F     [address] [place], [place] [address]    PLAN OF CARE and INTERVENTIONS:               1.  GOALS OF CARE/ ADVANCE CARE PLANNING:  Remain home under the care of her daughter.               2.  PATIENT/CAREGIVER EDUCATION:  S/S of infection to the sacral wound.               4. PERSONAL EMERGENCY PLAN:  Activate 911 for emergencies.               5.  DISEASE STATUS:  Joint visit with [place], SW, Kathy-daughter and patient.  Patient is found sitting in her recliner chair.  She is engaging in conversation and is able to provide updates since our last visit.   Home health continues to see patient weekly for a sacral wound.  Daughter is unable to provide care to this area.  We discussed the likelihood that home health will discharge patient soon.  Currently a simple dressing is being applied to this area that consist of a Vaseline gauze and dressing on a weekly basis.  Advised daughter that the area should be monitored routinely once home health is discharged.  Advised of s/s of wound infection and need for patient to be seen by PCP should signs of infection occur.  Daughter discussed caregiver fatigue and limitations of being the primary caregiver.  She is utilizing a private caregiver when out of town trips are being planned.  I have discussed contacting [facility] nursing department to see if any nursing students would be interested in private sitting as an alternative/back-up.  Daughter has contacted agencies but the cost is limiting for patient.  At this time, daughter is planing for patient to remain in the home under her care.  Should something unforeseen occur and patient is hospitalized, she would then look at SNF placement for  patient.  Patient continues to use her rolling walker for safe ambulation.  No falls are reported.  Patient is able to get out of her recliner without assistance.  She continues to ambulate to the bathroom and kitchen.  Daughter feels that patient is requiring more assistance.  Patient is reporting weakness to her legs so daughter will occasionally bring medications and meals to her room.   Daughter and patient report a slight decline in po intake.  Patient is taking a Boost supplement daily.  She is preparing simple meals for breakfast and lunch.  Patient has experienced a 4 lb weight loss in the last 3 months.  Patient is reporting a productive cough with clear sputum.  She is using her nebulizer as needed and notes this is helpful.  She does experience shortness of breath with exertion.  No oxygen in use at this time.   Patient does have some anxiety with changes.  She currently has lorazepam that she takes as needed.  She reports needing this 1-2x a week.    HISTORY OF PRESENT ILLNESS:  87 year old female with COPD.  Patient is being followed by Palliative Care monthly and PRN.  CODE STATUS: DNR ADVANCED DIRECTIVES: Yes MOST FORM: No PPS: 50%  PHYSICAL EXAM:   VITALS: Today's Vitals   02/26/21 1155  BP: 126/70  Pulse: 95  Resp: 18  Temp: 98.2 F (36.8 C)  SpO2: 94%  Weight: 117 lb 3.2 oz (53.2 kg)  PainSc: 0-No pain    LUNGS: Diminished to bilateral bases but clear, no wheezes, rhonchi or rales present.  DOE. CARDIAC: HRR EXTREMITIES: trace edema to bilateral ankles. SKIN: Stage 3 sacral wound  NEURO: Alert and oriented x 3.  Some forgetfulness present.       Lorenza Burton, RN

## 2021-02-26 NOTE — Progress Notes (Signed)
COMMUNITY PALLIATIVE CARE SW NOTE  PATIENT NAME: Joyce [name] DOB: 26-Aug-1928 MRN: [id number]  PRIMARY CARE PROVIDER: Jerrol Banana., MD  RESPONSIBLE PARTY:  Acct ID - Guarantor Home Phone Work Phone Relationship Acct Type  [id number] Joyce [name]  Self P/F     [address], [place], [place] [address]     PLAN OF CARE and INTERVENTIONS:             1. GOALS OF CARE/ ADVANCE CARE PLANNING: :  Patient is a DNR. Daughter Joyce [name] is HCPOA. Patient's goal is to avoid hospitalizations and remain in the home with daughter and SIL.  2.         SOCIAL/EMOTIONAL/SPIRITUAL ASSESSMENT/ INTERVENTIONS:  SW and RN met with patient and daughter, Joyce [name] in daughters home for monthly palliative care visit. Patient resides with daughter and SIL in a one story home. Daughter is primary caregiver. Patient sitting in recliner in her room during visit.    Daughter updated SW and RN and patients medical condition/changes prior to seeing patient. Per daughter patient has seemed to decline a bit, cognitively. SW and RN discussed long term care planning with daughter - additional care in the home vs placement. Patient [place] not qualify [place] at this time and is too high functioning for SNF placement. Daughter shared that she has a Electronics engineer that she outreaches for some respite hours every so often. SW and RN encouraged daughter to outreach [facility] nursing program to put fillers out for any nursing students looking to acquire additional money.   Patient shared that she feels she has been doing fine, but does think she has been getting weaker since the beginning of the year. Patient eating well and drinks boost supplements as well, current weight is 117lbs, down 3 lbs from last visit. Patient and daughter state that patients appetite has declined a bit since beginning of year as well. Patient sleeps well, continues to sleep in recliner stating it's comfortable for her and helps her  breathing as well since she is sitting/lying up right.  No falls reported, patient uses    RN reviewed medication and vitals. Patient continues to have area/wound on bottom. [facility] providing wound care weekly. RN assessed wound during visit, wound is clean, no drainage and no infection identified.   Patient and daughter had no other concerns for SW. discussed goals, reviewed care plan, provided emotional support, used active and reflective listening.  3.         PATIENT/CAREGIVER EDUCATION/ COPING:  Patient A&O and engaged in discussion. Patient shared that her anxiety declined since last visit. Patient takes lorazepam as needed. Daughter shares that she feels patient is taking lorazepam more often than she is relaying to [place]. Daughter or SIL are supportive. SW encouraged patient to sit out on screened in deck more often. Patient enjoys coloring during the day to assist with her anxiety and watching TV.   4.         PERSONAL EMERGENCY PLAN:  Family to call 911 for emergencies.  5.         COMMUNITY RESOURCES COORDINATION/ HEALTH CARE NAVIGATION:  Daughter manages care. Patient has [facility] nurse for wound. Daughter has contacts for personal caregivers should she need to go out of town that can be with patient.  6.         FINANCIAL/LEGAL CONCERNS/INTERVENTIONS:  None.         SOCIAL HX:  Social History  Tobacco Use  . Smoking status: Never Smoker  . Smokeless tobacco: Never Used  Substance Use Topics  . Alcohol use: Not Currently    CODE STATUS: DNR ADVANCED DIRECTIVES: Y MOST FORM COMPLETE: N HOSPICE EDUCATION PROVIDED: N  PPS: patient is MIN A to supervison with ADLS such as bathing. Patient able to dress self, toilet self, and prepare light meals.   Time spent: [place], [place]

## 2021-03-01 ENCOUNTER — Other Ambulatory Visit: Payer: Self-pay | Admitting: Family Medicine

## 2021-03-05 DIAGNOSIS — J449 Chronic obstructive pulmonary disease, unspecified: Secondary | ICD-10-CM | POA: Diagnosis not present

## 2021-03-05 DIAGNOSIS — E782 Mixed hyperlipidemia: Secondary | ICD-10-CM | POA: Diagnosis not present

## 2021-03-05 DIAGNOSIS — R413 Other amnesia: Secondary | ICD-10-CM | POA: Diagnosis not present

## 2021-03-05 DIAGNOSIS — I10 Essential (primary) hypertension: Secondary | ICD-10-CM | POA: Diagnosis not present

## 2021-03-05 DIAGNOSIS — L89153 Pressure ulcer of sacral region, stage 3: Secondary | ICD-10-CM | POA: Diagnosis not present

## 2021-03-05 DIAGNOSIS — E440 Moderate protein-calorie malnutrition: Secondary | ICD-10-CM | POA: Diagnosis not present

## 2021-03-14 DIAGNOSIS — J449 Chronic obstructive pulmonary disease, unspecified: Secondary | ICD-10-CM | POA: Diagnosis not present

## 2021-03-14 DIAGNOSIS — I10 Essential (primary) hypertension: Secondary | ICD-10-CM | POA: Diagnosis not present

## 2021-03-14 DIAGNOSIS — E782 Mixed hyperlipidemia: Secondary | ICD-10-CM | POA: Diagnosis not present

## 2021-03-14 DIAGNOSIS — E440 Moderate protein-calorie malnutrition: Secondary | ICD-10-CM | POA: Diagnosis not present

## 2021-03-14 DIAGNOSIS — L89153 Pressure ulcer of sacral region, stage 3: Secondary | ICD-10-CM | POA: Diagnosis not present

## 2021-03-14 DIAGNOSIS — R413 Other amnesia: Secondary | ICD-10-CM | POA: Diagnosis not present

## 2021-03-26 ENCOUNTER — Other Ambulatory Visit: Payer: Self-pay

## 2021-03-26 ENCOUNTER — Other Ambulatory Visit: Payer: Medicare Other

## 2021-03-26 DIAGNOSIS — Z515 Encounter for palliative care: Secondary | ICD-10-CM

## 2021-03-26 NOTE — Progress Notes (Signed)
PATIENT NAME: Joyce [name] DOB: January 05, 1928 MRN: [id number]  PRIMARY CARE PROVIDER: Jerrol [name]., MD  RESPONSIBLE PARTY:  Acct ID - Guarantor Home Phone Work Phone Relationship Acct Type  [id number] Joyce, [name]  Self P/F     [address] [place], [place] [address]   TELEHEALTH VISIT STATEMENT Due to the COVID-19 crisis, this visit was done via telemedicine from my office and it was initiated and consent by this patient and or family.  PLAN OF CARE and INTERVENTIONS:               1.  GOALS OF CARE/ ADVANCE CARE PLANNING:                 2.  PATIENT/CAREGIVER EDUCATION:  S/s of wound infection               4. PERSONAL EMERGENCY PLAN:  Activate 911 for emergencies.               5.  DISEASE STATUS:  Telephonic visit completed with daughter Joyce [name].  She reports there have been no changes with the patient since my last visit.  Daughter states home health discharged patient 1-2 weeks ago. No changes are reported to the wound at this time.   Joyce [name] is aware she can call Palliative Care to assess the wound if there is concern that it is worsening or showing signs of infections.   At this time there are not dressings to the area.  Patient continues to use a rolling walker to ambulate.  There are not falls reported.  Patient continues to have a good appetite and daughter does not believe there has been any changes with her weight.   HISTORY OF PRESENT ILLNESS:  87 year old female with COPD. Patient is being followed by Palliative Care monthly and PRN. CODE STATUS: DNR ADVANCED DIRECTIVES: Yes MOST FORM: No PPS: 50%    Lorenza Burton, RN

## 2021-04-03 ENCOUNTER — Other Ambulatory Visit: Payer: Self-pay | Admitting: Family Medicine

## 2021-04-03 DIAGNOSIS — F411 Generalized anxiety disorder: Secondary | ICD-10-CM

## 2021-04-03 NOTE — Telephone Encounter (Signed)
Requested medication (s) are due for refill today: no  Requested medication (s) are on the active medication list: yes  Last refill:  07/25/2020  Future visit scheduled: yes  Notes to clinic: this refill cannot be delegated   Requested Prescriptions  Pending Prescriptions Disp Refills   LORazepam (ATIVAN) 0.5 MG tablet [Pharmacy Med Name: LORAZEPAM 0.5MG  TABLETS] 60 tablet     Sig: TAKE 1/2 TABLET(0.25 MG) BY MOUTH TWICE DAILY AS NEEDED FOR ANXIETY      Not Delegated - Psychiatry:  Anxiolytics/Hypnotics Failed - 04/03/2021  8:35 AM      Failed - This refill cannot be delegated      Failed - Urine Drug Screen completed in last 360 days      Passed - Valid encounter within last 6 months    Recent Outpatient Visits           2 months ago Essential hypertension   [facility] Jerrol Banana., MD   6 months ago Bronchiectasis without acute exacerbation [facility])   [facility] Jerrol Banana., MD   10 months ago Generalized anxiety disorder   [facility] Jerrol Banana., MD   1 year ago Generalized anxiety disorder   [facility] Jerrol Banana., MD   1 year ago Sacral decubitus ulcer, stage III [facility])   [place], Kelby Aline, FNP       Future Appointments             In 2 weeks Jerrol Banana., MD [facility], [place]

## 2021-04-19 ENCOUNTER — Other Ambulatory Visit: Payer: Medicare Other

## 2021-04-19 ENCOUNTER — Telehealth: Payer: Self-pay

## 2021-04-19 ENCOUNTER — Other Ambulatory Visit: Payer: Self-pay | Admitting: Family Medicine

## 2021-04-19 ENCOUNTER — Ambulatory Visit: Payer: Medicare Other | Admitting: Family Medicine

## 2021-04-19 ENCOUNTER — Other Ambulatory Visit: Payer: Self-pay

## 2021-04-19 VITALS — BP 120/64 | HR 60 | Temp 97.8°F | Resp 28 | Wt 115.8 lb

## 2021-04-19 DIAGNOSIS — Z515 Encounter for palliative care: Secondary | ICD-10-CM

## 2021-04-19 DIAGNOSIS — F411 Generalized anxiety disorder: Secondary | ICD-10-CM

## 2021-04-19 NOTE — Progress Notes (Signed)
PATIENT NAME: Joyce [name] DOB: Oct 01, 1928 MRN: [id number]  PRIMARY CARE PROVIDER: Jerrol Banana., MD  RESPONSIBLE PARTY:  Acct ID - Guarantor Home Phone Work Phone Relationship Acct Type  [id number] KYNZLIE, [phone]  Self P/F     [address] [place], [place] [address]    PLAN OF CARE and INTERVENTIONS:               1.  GOALS OF CARE/ ADVANCE CARE PLANNING:  Remain home under the care of her daughter.  Long term placement would be explored should patient require a hospitalization.               2.  PATIENT/CAREGIVER EDUCATION:  COPD               4. PERSONAL EMERGENCY PLAN:  Activate 911 for emergencies.               5.  DISEASE STATUS:  Joint visit completed with [place], SW, daughter [name] and patient.  Daughter shares ongoing caregiver fatigue and changes she is noted with patient.  Cognitive decline is noted by daughter.  Patient is forgetting to take her medications and needs prompting.  She no longer makes meals and daughter is bringing meals to patient. On occasion, she will go to the kitchen to warm of breakfast that was prepared by daughter.  Patient has been found in the kitchen standing by the counter but uncertain as to what to prepare for meals.    Patient with O2 saturation of 87-88% on RA.  Productive cough of clear sputum.  Albuterol breathing treatment administered and O2 sats are 90-91% on RA.  Patient and daughter note and increase in shortness of breath with increase in cough.  Rales present.  Patient ambulated to the bathroom to obtain a current weight.  She became very anxious ambulating the short distance.  O2 sats dropped to 87% on RA with HR 101.  Patient returned to her chair, pursed lip breathing and use of accessory muscles noted.  Patient is able to settle after 10 minutes and O2 sats are 90% on RA.   Current weight is 115.8 lbs.  BMI 22.62.  7 lb weight loss noted in 3 months.  Daughter notes patient's appetite has decreased she is not  eating 100% of meals now.  Sometimes not eating and other times eating 50% of meals.  She is drinking a supplement daily.     Patient is dressing herself independently.  Daughter is assisting with showers.  She is incontinent of urine and using depends with poise pads.  She is changing pads about 5-6 x a day.  She remains continent of bowel and reports no issues with constipation.  Stage 3 sacral wound remains present.  No signs of infection noted.  [facility] is no longer seeing patient.  No dressing in place.  Daughter states she is unable to manage the wound.   1155 am.  Phone call made to PCP office to advise of respiratory status.  Response pending at this time.    HISTORY OF PRESENT ILLNESS:  87 year old female with COPD and HTN.  Patient is being followed by Palliative Care monthly and PRN.  CODE STATUS: DNR ADVANCED DIRECTIVES: Yes MOST FORM: No PPS: 50% (weak)   PHYSICAL EXAM:   VITALS: Today's Vitals   04/19/21 1118  BP: 120/64  Pulse: 60  Resp: (!) 28  Temp: 97.8 F (36.6 C)  SpO2: (!) 87%  PainSc: 0-No pain    LUNGS: positive findings: rales right upper anterior, right upper posterior, and left upper posterior, scattered rales bilaterally CARDIAC: Cor RRR EXTREMITIES: 2+ edema to right ankle and 1+ edema to left ankle SKIN: Skin color, texture, turgor normal. No rashes or lesions or Stage 3 sacral wound.   NEURO: positive for memory problems and weakness       Lorenza Burton, RN

## 2021-04-19 NOTE — Telephone Encounter (Signed)
Please advise 

## 2021-04-19 NOTE — Telephone Encounter (Signed)
Copied from [place] [id number]. Topic: General - Other >> Apr 19, 2021 11:56 AM Tessa Lerner A wrote: Reason for CRM Joyce [name] with Palliative care called to provide the following updates on patient's breathing and oxygen  Patient's oxygen was 87 with room air Elevated  to 91 with a nebulizer treatment Declined back to 87 after light activity  Finally settled at 90  Patient also has a cough as well as worsening shortness of breath  Almyra Free has additional information related to the paitent's breathing that she would like to discuss further  Please contact to further advise

## 2021-04-19 NOTE — Progress Notes (Signed)
COMMUNITY PALLIATIVE CARE SW NOTE  PATIENT NAME: Joyce [name] DOB: [birth year] MRN: [id number]  PRIMARY CARE PROVIDER: Jerrol [name]., MD  RESPONSIBLE PARTY:  Acct ID - Guarantor Home Phone Work Phone Relationship Acct Type  [id number] Joyce [name]  Self P/F     [address], [place], [place] [address]     PLAN OF CARE and INTERVENTIONS:             GOALS OF CARE/ ADVANCE CARE PLANNING:  Patient is a DNR. Daughter Joyce [name] is HCPOA. Patient's goal is to avoid hospitalizations and remain in the home with daughter and SIL.   2.         SOCIAL/EMOTIONAL/SPIRITUAL ASSESSMENT/ INTERVENTIONS:  SW and RN met with patient and daughter, Joyce [name] in daughters home for monthly palliative care visit. Patient resides with daughter and SIL in a one story home. Daughter is primary caregiver. Patient sitting in recliner in her room during visit.   Daughter updated SW and RN and patients medical condition/changes prior to seeing patient. Per daughter patient has seemed to decline a bit, cognitively. SW and RN discussed long term care planning with daughter - additional care in the home vs placement. Patient is more incontinent with bladder.    Patient shared that she feels she has been doing fine, but does think she has been getting weaker since the beginning of the year. Patient eating well and drinks boost supplements as well, current weight is 115lbs, down 2 lbs from last visit. Patient sleeps well, continues to sleep in recliner stating it's comfortable for her and helps her breathing as well since she is sitting/lying up right.  No falls reported, patient uses rollator for ambulation.    RN reviewed medication and vitals. [facility] is no longer proving wound care to bottom. Patient expresses pain in bottom every now and then. RN assessed wound, there is some drainage from the wound. Patients breathing is exacerbated during this visit. Stats are 87%/88% at exertion. Patient has some  congestion. Breathing treatment applied during visit. Patient shared her worry and anxiety that comes with experiencing more SHOB. RN outreached PCP office to make aware.   Patient and daughter had no other concerns for SW. discussed goals, reviewed care plan, provided emotional support, used active and reflective listening.   3.         PATIENT/CAREGIVER EDUCATION/ COPING:  Patient A&O and engaged in discussion. Patient shared that her anxiety declined since last visit. Patient takes lorazepam as needed. Daughter shares that she feels patient is taking lorazepam more often than she is relaying to [place]. Daughter or SIL are supportive. SW encouraged patient to sit out on screened in deck more often. Patient enjoys coloring during the day to assist with her anxiety and watching TV.   4.         PERSONAL EMERGENCY PLAN:  Family to call 911 for emergencies.   5.         COMMUNITY RESOURCES COORDINATION/ HEALTH CARE NAVIGATION:  Daughter manages care. Patient has [facility] nurse for wound. Daughter has contacts for personal caregivers should she need to go out of town that can be with patient.  6.         FINANCIAL/LEGAL CONCERNS/INTERVENTIONS:  None.         SOCIAL HX:  Social History   Tobacco Use   Smoking status: Never   Smokeless tobacco: Never  Substance Use Topics   Alcohol use: Not Currently  CODE STATUS: DNR  ADVANCED DIRECTIVES: Y MOST FORM COMPLETE:  N HOSPICE EDUCATION PROVIDED: N  PPS: Patient is Min A with ADL's. Daughter assist with meal prep.       Joyce [name], Joyce [name]

## 2021-04-19 NOTE — Telephone Encounter (Signed)
145 pm.  Incoming call from Dr. Rosanna Randy.  Advised of assessment from this morning with patient.  New orders received for Doxycycline 100 mg bid x 5 days and Prednisone 20 mg daily for 5 days.  Re-check oxygen level after treatment completed.  O2 can be ordered if O2 sats remain at 87%.  155 pm.  Phone call made to daughter [name] with above information.  Confirmed pharmacy is [facility] in [place].  Advised that I would need to do a re-check of oxygen level next week. Daughter stated if patient did not improve after treatment she may want a chest x-ray.  Advised that we would reassess next week.  201 pm.  Call made to [facility] in [place] with new medications.

## 2021-04-19 NOTE — Telephone Encounter (Signed)
Using doxycycline and prednisone for 5 days to try to clear flare of bronchiectasis. Overall health failing Palliative care will follow up in 1 to 2 weeks for room air oxygen No callback necessary. May need oxygen.

## 2021-05-05 DIAGNOSIS — Z20822 Contact with and (suspected) exposure to covid-19: Secondary | ICD-10-CM | POA: Diagnosis not present

## 2021-05-08 ENCOUNTER — Telehealth: Payer: Self-pay

## 2021-05-08 NOTE — Telephone Encounter (Signed)
10 am.  Phone call made to [facility] to follow up on patient's respiratory status.  [name] reports obtaining a pulse ox to monitor O2 saturations.  Patient is now at 94% on room air.  She reports improvement in patient's breathing with the completion of prednisone and antibiotics.  Wound is bleeding slightly and patient will be seen by PCP tomorrow.  Advised if RN needs to assess this to please let me know.  No other concerns voiced at this time.

## 2021-05-09 ENCOUNTER — Ambulatory Visit (INDEPENDENT_AMBULATORY_CARE_PROVIDER_SITE_OTHER): Payer: Medicare Other | Admitting: Family Medicine

## 2021-05-09 ENCOUNTER — Encounter: Payer: Self-pay | Admitting: Family Medicine

## 2021-05-09 ENCOUNTER — Other Ambulatory Visit: Payer: Self-pay | Admitting: Family Medicine

## 2021-05-09 ENCOUNTER — Other Ambulatory Visit: Payer: Self-pay

## 2021-05-09 VITALS — BP 126/66 | HR 86 | Temp 98.3°F | Resp 20 | Wt 117.0 lb

## 2021-05-09 DIAGNOSIS — E782 Mixed hyperlipidemia: Secondary | ICD-10-CM | POA: Diagnosis not present

## 2021-05-09 DIAGNOSIS — E871 Hypo-osmolality and hyponatremia: Secondary | ICD-10-CM

## 2021-05-09 DIAGNOSIS — R634 Abnormal weight loss: Secondary | ICD-10-CM | POA: Diagnosis not present

## 2021-05-09 DIAGNOSIS — L89153 Pressure ulcer of sacral region, stage 3: Secondary | ICD-10-CM

## 2021-05-09 DIAGNOSIS — I10 Essential (primary) hypertension: Secondary | ICD-10-CM | POA: Diagnosis not present

## 2021-05-09 DIAGNOSIS — C73 Malignant neoplasm of thyroid gland: Secondary | ICD-10-CM

## 2021-05-09 DIAGNOSIS — E210 Primary hyperparathyroidism: Secondary | ICD-10-CM | POA: Diagnosis not present

## 2021-05-09 DIAGNOSIS — E440 Moderate protein-calorie malnutrition: Secondary | ICD-10-CM | POA: Diagnosis not present

## 2021-05-09 DIAGNOSIS — J479 Bronchiectasis, uncomplicated: Secondary | ICD-10-CM | POA: Diagnosis not present

## 2021-05-09 NOTE — Progress Notes (Signed)
Established patient visit   Patient: Joyce [name]   DOB: 1928-09-03   87 y.o. Female  MRN: [id number] Visit Date: 05/09/2021  Today's healthcare provider: [name], MD   Chief Complaint  Patient presents with   Hypertension   Hypothyroidism   Hyperlipidemia   MCI   Subjective    HPI  Patient is brought in by her son-in-law.  Overall she is doing fairly well.  She has no complaints today.  Oxygen saturations at home are 94 to 95%. Home health is coming back to check in on her and check in on her sacral decubitus decubitus. It is not worsening. Appetite is okay weight is stable. She has had no recent falls. Hypertension, follow-up  BP Readings from Last 3 Encounters:  05/09/21 126/66  04/19/21 120/64  02/26/21 126/70   Wt Readings from Last 3 Encounters:  05/09/21 117 lb (53.1 kg)  04/19/21 115 lb 12.8 oz (52.5 kg)  02/26/21 117 lb 3.2 oz (53.2 kg)     She was last seen for hypertension 3 months ago.  BP at that visit was 130/70. Management since that visit includes no medication changes. Continue to monitor.   She reports good compliance with treatment. She is not having side effects.  She is following a Regular diet. She is not exercising. She does not smoke.  Use of agents associated with hypertension: none.   Outside blood pressures are checked occasionally . Symptoms: No chest pain No chest pressure  No palpitations No syncope  No dyspnea No orthopnea  No paroxysmal nocturnal dyspnea No lower extremity edema   Pertinent labs: Lab Results  Component Value Date   CHOL 135 05/16/2020   HDL 55 05/16/2020   LDLCALC 70 05/16/2020   TRIG 45 05/16/2020   CHOLHDL 2.5 05/16/2020   Lab Results  Component Value Date   NA 141 05/16/2020   K 4.1 05/16/2020   CREATININE 0.57 05/16/2020   GFRNONAA 82 05/16/2020   GFRAA 94 05/16/2020   GLUCOSE 83 05/16/2020     The ASCVD Risk score (Haywood., et al., 2013) failed to calculate for the  following reasons:   The 2013 ASCVD risk score is only valid for ages 62 to 50   --------------------------------------------------------------------------------------------------- Thyroid, follow-up  Lab Results  Component Value Date   TSH 7.720 (H) 05/16/2020   TSH 0.272 (L) 09/15/2019   TSH 6.213 (H) 01/29/2019   T4TOTAL 9.9 08/27/2018   Wt Readings from Last 3 Encounters:  05/09/21 117 lb (53.1 kg)  04/19/21 115 lb 12.8 oz (52.5 kg)  02/26/21 117 lb 3.2 oz (53.2 kg)    She was last seen for hypothyroid 3 months ago.  Management since that visit includes no medication changes. She reports good compliance with treatment. She is not having side effects.   Symptoms: No change in energy level No constipation  No diarrhea No heat / cold intolerance  No nervousness No palpitations  No weight changes    Lipid/Cholesterol, Follow-up  Last lipid panel Other pertinent labs  Lab Results  Component Value Date   CHOL 135 05/16/2020   HDL 55 05/16/2020   LDLCALC 70 05/16/2020   TRIG 45 05/16/2020   CHOLHDL 2.5 05/16/2020   Lab Results  Component Value Date   ALT 17 05/16/2020   AST 18 05/16/2020   PLT 293 05/16/2020   TSH 7.720 (H) 05/16/2020     She was last seen for this 3 months ago.  Management since that visit includes no medication changes.  She reports good compliance with treatment. She is not having side effects.        Medications: Outpatient Medications Prior to Visit  Medication Sig   acetaminophen (TYLENOL) 325 MG tablet Take 650 mg by mouth 2 (two) times daily. Taking two tablets every morning.   ADVAIR DISKUS 250-50 MCG/DOSE AEPB INHALE 1 PUFF BY MOUTH TWICE DAILY. RINSE MOUTH WITH WATER AFTER USE TO REDUCE AFTERTASTE AND INCIDENCE OF CANDIDIASIS. DO NOT SWALLOW   albuterol (PROVENTIL) (2.5 MG/3ML) 0.083% nebulizer solution USE 1 VIAL VIA NEBULIZER EVERY 6 HOURS AS NEEDED FOR WHEEZING OR SHORTNESS OF BREATH   alendronate (FOSAMAX) 70 MG tablet TAKE 1  TABLET BY MOUTH ONCE A WEEK WITH A FULL GLASS OF WATER ON AN EMPTY STOMACH   atorvastatin (LIPITOR) 10 MG tablet TAKE 1 TABLET BY MOUTH DAILY   calcium carbonate (TUMS - DOSED IN MG ELEMENTAL CALCIUM) 500 MG chewable tablet Chew 2 tablets by mouth daily.    cholecalciferol (VITAMIN D) 1000 units tablet Take 2,000 Units by mouth daily.   DILT-XR 120 MG 24 hr capsule TAKE 2 CAPSULES BY MOUTH EVERY MORNING AND 1 CAPSULE EVERY EVENING   feeding supplement, ENSURE ENLIVE, (ENSURE ENLIVE) LIQD Take 237 mLs by mouth 2 (two) times daily between meals.   levothyroxine (SYNTHROID) 100 MCG tablet TAKE 1 TABLET(100 MCG) BY MOUTH DAILY   LORazepam (ATIVAN) 0.5 MG tablet TAKE 1/2 TABLET(0.25 MG) BY MOUTH TWICE DAILY AS NEEDED FOR ANXIETY   mirtazapine (REMERON) 30 MG tablet TAKE 1 TABLET(30 MG) BY MOUTH AT BEDTIME   acidophilus (RISAQUAD) CAPS capsule Take 1 capsule by mouth daily. (Patient not taking: Reported on 06/07/2020)   ADVAIR DISKUS 250-50 MCG/DOSE AEPB INHALE 1 PUFF INTO THE LUNGS TWICE DAILY   clotrimazole (CLOTRIMAZOLE ATHLETES FOOT) 1 % cream Apply 1 application topically 2 (two) times daily. Between toes, not on any open skin/ for fungus (Patient not taking: Reported on 06/07/2020)   diltiazem (CARDIZEM CD) 120 MG 24 hr capsule TK 2 CS PO QAM AND 1 C QPM (Patient not taking: Reported on 06/07/2020)   doxycycline (VIBRA-TABS) 100 MG tablet Take 1 tablet (100 mg total) by mouth 2 (two) times daily.   levofloxacin (LEVAQUIN) 500 MG tablet Take 1 tablet (500 mg total) by mouth daily.   Multiple Vitamin (MULTIVITAMIN WITH MINERALS) TABS tablet Take 1 tablet by mouth daily.   mupirocin ointment (BACTROBAN) 2 % Apply 1 application topically 2 (two) times daily. Apply very small thin amount after cleaning with soap and water. (Patient not taking: Reported on 03/14/2020)   No facility-administered medications prior to visit.    Review of Systems  Constitutional:  Negative for activity change and fatigue.   Respiratory:  Negative for cough and shortness of breath.   Cardiovascular:  Negative for chest pain, palpitations and leg swelling.  Neurological:  Negative for dizziness, light-headedness and headaches.  Psychiatric/Behavioral:  Negative for self-injury, sleep disturbance and suicidal ideas. The patient is not nervous/anxious.        Objective    BP 126/66   Pulse 86   Temp 98.3 F (36.8 C)   Resp 20   Wt 117 lb (53.1 kg)   SpO2 97%   BMI 22.85 kg/m  BP Readings from Last 3 Encounters:  05/09/21 126/66  04/19/21 120/64  02/26/21 126/70   Wt Readings from Last 3 Encounters:  05/09/21 117 lb (53.1 kg)  04/19/21 115 lb 12.8 oz (52.5  kg)  02/26/21 117 lb 3.2 oz (53.2 kg)       Physical Exam Vitals reviewed.  Constitutional:      Appearance: She is not toxic-appearing.  HENT:     Head: Normocephalic and atraumatic.     Right Ear: External ear normal.     Left Ear: External ear normal.  Eyes:     General: No scleral icterus.    Conjunctiva/sclera: Conjunctivae normal.  Cardiovascular:     Rate and Rhythm: Normal rate and regular rhythm.     Heart sounds: Murmur heard.     Comments: 2/6 systolic murmur at left lower sternal border Pulmonary:     Effort: Pulmonary effort is normal.     Breath sounds: Rhonchi present.  Abdominal:     Palpations: Abdomen is soft.  Skin:    General: Skin is warm and dry.     Comments: Stage III sacral decubitus that is not infected and stable today.  Neurological:     General: No focal deficit present.     Mental Status: She is alert and oriented to person, place, and time. Mental status is at baseline.  Psychiatric:        Mood and Affect: Mood normal.        Behavior: Behavior normal.        Thought Content: Thought content normal.        Judgment: Judgment normal.      No results found for any visits on 05/09/21.  Assessment & Plan     1. Essential hypertension Controlled.  No changes on diltiazem - CBC with  Differential/Platelet - Comprehensive metabolic panel  2. Bronchiectasis without acute exacerbation (HCC) Clinically stable.  O2 sat stable.  No recent infections  3. Malnutrition of moderate degree (HCC) Clinically patient failure to thrive but stable.  She gets good care at home with her daughter and son-in-law.  4. Mixed hyperlipidemia On atorvastatin - Lipid panel  5. Primary hyperparathyroidism (HCC)  - TSH  6. Weight loss Stable in recent couple of years  7. H/o Papillary carcinoma of thyroid ([place])   8. Hyponatremia Follow labs  9. H/oThyroid cancer ([place])  - TSH  10. Sacral decubitus ulcer, stage III (HCC) Clinically stable.  Patient declines referral and has for more than a year to wound care center .  This is not infected.   No follow-ups on file.      I, [name], MD, have reviewed all documentation for this visit. The documentation on 05/13/21 for the exam, diagnosis, procedures, and orders are all accurate and complete.    Shayle Donahoo [name], MD  [facility] [phone] (phone) [phone] (fax)  [place]

## 2021-05-10 LAB — CBC WITH DIFFERENTIAL/PLATELET
Basophils Absolute: 0.1 10*3/uL (ref 0.0–0.2)
Basos: 0 %
EOS (ABSOLUTE): 0.1 10*3/uL (ref 0.0–0.4)
Eos: 1 %
Hematocrit: 37.2 % (ref 34.0–46.6)
Hemoglobin: 12.2 g/dL (ref 11.1–15.9)
Immature Grans (Abs): 0 10*3/uL (ref 0.0–0.1)
Immature Granulocytes: 0 %
Lymphocytes Absolute: 1.6 10*3/uL (ref 0.7–3.1)
Lymphs: 13 %
MCH: 27.2 pg (ref 26.6–33.0)
MCHC: 32.8 g/dL (ref 31.5–35.7)
MCV: 83 fL (ref 79–97)
Monocytes Absolute: 0.8 10*3/uL (ref 0.1–0.9)
Monocytes: 7 %
Neutrophils Absolute: 9.4 10*3/uL — ABNORMAL HIGH (ref 1.4–7.0)
Neutrophils: 79 %
Platelets: 270 10*3/uL (ref 150–450)
RBC: 4.48 x10E6/uL (ref 3.77–5.28)
RDW: 14.4 % (ref 11.7–15.4)
WBC: 12.1 10*3/uL — ABNORMAL HIGH (ref 3.4–10.8)

## 2021-05-10 LAB — COMPREHENSIVE METABOLIC PANEL
ALT: 11 IU/L (ref 0–32)
AST: 13 IU/L (ref 0–40)
Albumin/Globulin Ratio: 1.5 (ref 1.2–2.2)
Albumin: 4 g/dL (ref 3.5–4.6)
Alkaline Phosphatase: 91 IU/L (ref 44–121)
BUN/Creatinine Ratio: 32 — ABNORMAL HIGH (ref 12–28)
BUN: 19 mg/dL (ref 10–36)
Bilirubin Total: 0.6 mg/dL (ref 0.0–1.2)
CO2: 26 mmol/L (ref 20–29)
Calcium: 9.2 mg/dL (ref 8.7–10.3)
Chloride: 97 mmol/L (ref 96–106)
Creatinine, Ser: 0.59 mg/dL (ref 0.57–1.00)
Globulin, Total: 2.6 g/dL (ref 1.5–4.5)
Glucose: 94 mg/dL (ref 65–99)
Potassium: 3.7 mmol/L (ref 3.5–5.2)
Sodium: 141 mmol/L (ref 134–144)
Total Protein: 6.6 g/dL (ref 6.0–8.5)
eGFR: 85 mL/min/{1.73_m2} (ref >59)

## 2021-05-10 LAB — TSH: TSH: 13.8 u[IU]/mL — ABNORMAL HIGH (ref 0.450–4.500)

## 2021-05-10 LAB — LIPID PANEL
Chol/HDL Ratio: 2.5 ratio (ref 0.0–4.4)
Cholesterol, Total: 142 mg/dL (ref 100–199)
HDL: 57 mg/dL (ref >39)
LDL Chol Calc (NIH): 74 mg/dL (ref 0–99)
Triglycerides: 50 mg/dL (ref 0–149)
VLDL Cholesterol Cal: 11 mg/dL (ref 5–40)

## 2021-05-14 ENCOUNTER — Telehealth: Payer: Self-pay

## 2021-05-14 NOTE — Telephone Encounter (Signed)
Please review. Thanks!  

## 2021-05-14 NOTE — Telephone Encounter (Signed)
Copied from [place] [id number]. Topic: General - Other >> May 14, 2021  2:29 PM Tessa Lerner A wrote: Reason for CRM: Patient's daughter would like to be contacted regarding lab results from 05/09/21  Please contact when possible

## 2021-05-15 ENCOUNTER — Other Ambulatory Visit: Payer: Self-pay

## 2021-05-15 ENCOUNTER — Telehealth: Payer: Self-pay

## 2021-05-15 DIAGNOSIS — E890 Postprocedural hypothyroidism: Secondary | ICD-10-CM

## 2021-05-15 MED ORDER — LEVOTHYROXINE SODIUM 125 MCG PO TABS: 125.0000 ug | ORAL_TABLET | Freq: Every day | ORAL | 2 refills | Status: DC

## 2021-05-15 NOTE — Telephone Encounter (Signed)
Pts family calling again. They state that they were able to see pts blood work on [employer] and are concerned with the results. State the pt has not been feeling well and is requesting to have PCP reach out today. Please advise.

## 2021-05-15 NOTE — Telephone Encounter (Signed)
946 am.  Incoming call from [name] and message has been left. Daughter has seen lab results and asking about follow up .  211 pm.  Return call made to [name].  She states she has reviewed the lab values from last week's visit with PCP.  She is concerned about WBC's and TSH levels. Daughter states she has contacted PCP office but has not heard anything back.  Chart reviewed and advised that PCP has reviewed results and she should be getting a call regarding medication adjustments to Synthroid.  Advised PCP did not have any other concerns about labs per notation.  Encouraged daughter to call the PCP office tomorrow if she did not hear back from them today. Daughter voiced understanding and will follow up with PCP.

## 2021-05-15 NOTE — Telephone Encounter (Signed)
Advised patient's daughter of results.

## 2021-05-24 ENCOUNTER — Other Ambulatory Visit: Payer: Self-pay | Admitting: Family Medicine

## 2021-05-28 ENCOUNTER — Telehealth: Payer: Self-pay

## 2021-05-28 NOTE — Telephone Encounter (Signed)
1035 am.  Return call made to [facility] regarding changes in her mother.  [name] reports patient is starting to have bowel incontinence.  Patient is napping more during the daytime hours.  Sleeping longer in the am.  Increase weakness, difficulty standing and decrease po intake.  [name] is needing additional assistance with care and would like to explore placement options.  Visit is scheduled for Wednesday morning at 930 am with RN/SW team.

## 2021-05-30 ENCOUNTER — Other Ambulatory Visit: Payer: Self-pay

## 2021-05-30 ENCOUNTER — Other Ambulatory Visit: Payer: Medicare Other

## 2021-05-30 DIAGNOSIS — Z515 Encounter for palliative care: Secondary | ICD-10-CM

## 2021-05-30 NOTE — Progress Notes (Signed)
COMMUNITY PALLIATIVE CARE SW NOTE  PATIENT NAME: Joyce [name] DOB: [birth year] MRN: [id number]  PRIMARY CARE PROVIDER: Jerrol Banana., MD  RESPONSIBLE PARTY:  Acct ID - Guarantor Home Phone Work Phone Relationship Acct Type  [id number] ANDREA, FERRER* [phone]  Self P/F     [address], [place], [place] [address]     PLAN OF CARE and INTERVENTIONS:             GOALS OF CARE/ ADVANCE CARE PLANNING:  Patient is a DNR. [name] is HCPOA. Patient's goal is to avoid hospitalizations and remain in the home with daughter and SIL.   2.         SOCIAL/EMOTIONAL/SPIRITUAL ASSESSMENT/ INTERVENTIONS:  SW met with patient and daughter, [name] in daughters home for monthly palliative care visit. Patient resides with daughter and SIL in a one story home. Daughter is primary caregiver. Patient sitting in recliner in her room during visit.   Daughter updated SW on patients medical condition/changes prior to seeing patient. Daughter shares that patient seems to have declined more since last in person. Daughter shares that patient is weaker, appetite has decreased, current weight today is 109.8 - down 8lbs this month. Patient is sleeping more at night and napping more during the day. Daughter shares that patients cognitive status has declined and patient is forgetting normal/daily routine tasks, such as when to make breakfast or taking medications. Patient is more incontinent of B/B. Daughter is needing to provide more assistance with care and ADL's such as bathing, toileting, meal prep and dressing. Daughter is more difficulty with mobility and patients sit-to-stand.  SW discussed long term planning and placement options vs hospice eligibility with daughter. Daughter shared that she would be open to both options, if patient qualified.  Patient has an overall decline - physically and cognitively. Patient shared that she does feel weaker and believes that she just needs to push herself more.  Patient shares that she sleeps well, continues to sleep in recliner stating it's comfortable for her and helps her breathing as well since she is sitting/lying up right.  Patient had a fall a couple weeks ago, where patient slipped out of her recliner while she was sleeping. Patient complained of being light headed/dizzy mainly in the mornings and when she stands after sitting for a long period of time.     Patient continues to have wound to buttocks. Patient expresses pain in bottom every now and then. Daughter is not addressing or caring for wound due to lack of comfortability. Patient continues to have some congestion and coughing. Patient shared her worry and anxiety that comes with experiencing more SHOB.   Patient and daughter had no other concerns for SW. discussed goals, reviewed care plan, provided emotional support, used active and reflective listening.   3.         PATIENT/CAREGIVER EDUCATION/ COPING:  Patient A&O and engaged in discussion. Patient shared that her anxiety declined since last visit. Patient takes lorazepam as needed. Daughter shares that she feels patient is taking lorazepam more often than she is relaying to [place]. Daughter or SIL are supportive. SW encouraged patient to sit out on screened in deck more often. Patient enjoys coloring during the day to assist with her anxiety and watching TV.   4.         PERSONAL EMERGENCY PLAN:  Family to call 911 for emergencies.  5.         COMMUNITY RESOURCES COORDINATION/ HEALTH CARE NAVIGATION:  Daughter manages care. Daughter has contacts for personal caregivers should she need to go out of town that can be with patient.   6.         FINANCIAL/LEGAL CONCERNS/INTERVENTIONS:  None.         SOCIAL HX:  Social History   Tobacco Use   Smoking status: Never   Smokeless tobacco: Never  Substance Use Topics   Alcohol use: Not Currently    CODE STATUS: DNR ADVANCED DIRECTIVES: Y MOST FORM COMPLETE:  N HOSPICE EDUCATION PROVIDED:  Y  PPS: Patient is MIN-MODA with ADL's.    Time spent: 45 min    [place], [place]

## 2021-06-01 ENCOUNTER — Telehealth: Payer: Self-pay

## 2021-06-01 DIAGNOSIS — R413 Other amnesia: Secondary | ICD-10-CM

## 2021-06-01 DIAGNOSIS — Z515 Encounter for palliative care: Secondary | ICD-10-CM

## 2021-06-01 NOTE — Telephone Encounter (Signed)
06/01/21 @ 1240PM: palliaitve care SW outreached PCP office to request hospice order/referral to be [facility].   Patient has had a decline since previous in person visit last month. Patients daughter, [name], called today stating that patient has become worse over the past few days since SW In person visit on 05/30/21. Patient is no longer ambulating and eating has become very minimal. Daughter is apprehensive about transporting to the hospital.  Case reviewed with medial director and patient is hospice eligible. Daughter in agreement with Hospice services.

## 2021-06-04 NOTE — Telephone Encounter (Signed)
Please review for Dr. Gilbert  Thanks,   -Emmelia [place]  

## 2021-06-04 NOTE — Telephone Encounter (Signed)
Marissa for hospice referral.

## 2021-06-04 NOTE — Telephone Encounter (Signed)
[place], Social Worker from [employer] # [phone] following up on  hospice referral request. Palliative Care fax # [phone]

## 2021-06-05 NOTE — Telephone Encounter (Signed)
Hospice referral placed.   Thanks,   -Mickel Baas

## 2021-06-05 NOTE — Addendum Note (Signed)
Addended by: Kizzie Furnish on: 06/05/2021 09:33 AM   Modules accepted: Orders

## 2021-06-05 NOTE — Telephone Encounter (Signed)
Order for hospice placed

## 2021-06-06 ENCOUNTER — Other Ambulatory Visit: Payer: Medicare Other

## 2021-06-06 ENCOUNTER — Other Ambulatory Visit: Payer: Self-pay

## 2021-06-06 VITALS — BP 112/60 | HR 86 | Temp 97.5°F | Resp 40 | Wt 110.6 lb

## 2021-06-06 DIAGNOSIS — Z515 Encounter for palliative care: Secondary | ICD-10-CM

## 2021-06-06 NOTE — Progress Notes (Signed)
PATIENT NAME: Joyce [name] DOB: [birth year] MRN: [id number]  PRIMARY CARE PROVIDER: Jerrol Banana., MD  RESPONSIBLE PARTY:  Acct ID - Guarantor Home Phone Work Phone Relationship Acct Type  [id number] SHENAE, [id number]  Self P/F     [address], [place], [place] [address]    PLAN OF CARE and INTERVENTIONS:               1.  GOALS OF CARE/ ADVANCE CARE PLANNING:  Remain home under the care of her daughter for as long as possible.  Have hospice present for additional support.               2.  PATIENT/CAREGIVER EDUCATION:  Education about respiratory status and use of nebulizer's.               4. PERSONAL EMERGENCY PLAN:  Activate 911 for emergencies.               5.  DISEASE STATUS:  Phone call received from [place].  Patient continues to decline with O2 sats in the upper 80's.  Patient is scheduled for a hospice admission tomorrow at 7 pm.  Daughter is uncertain how to further help patient.  Advised visit would be made.  Arrived to find patient in her recliner chair watching television.  Patient states she is not having any pain. She was having some weakness to her right foot over the last couple of days but this has improved as of today.  Right lower extremity with 3+ pitting edema present.  Patient visibly short of breath while talking with pursed lip breathing. Productive cough of yellow/greenish colored sputum. O2 saturation ranging from 85-86% on RA. Patient states she has been thinking of doing breathing treatments in the morning when she first awakens but she has not started this. Daughter states they are administering nebulizer's daily.  Patient is given a albuterol breathing treatment on my visit. O2 saturation after breathing treatment is 87-88%.  Phone call made to Dr. Alben Spittle office to advise of above.  Message will be given to MD by receptionist and either MD or RN will call back.  06/07/21 Follow up call made to PCP office and spoke with [facility].  Message  to be sent to MD.  Response pending. Checked Epic and zpak has been sent to [facility] in [place].  I have contacted [name] and left her a message with the update.  Appetite continues to be poor with the exception of breakfast. Lunches are mostly supplement drinks.  On occasion patient will have 1/2 sandwich with fruit but daughter notes this has declined in the last 2 weeks. Dinner meals are poor.  Daughter states 2 meals a day is rare now.  Weight obtained and is 110.6 lbs.  Weight up by 1 lb since SW visit last week.  This is likely due to edema on the right lower leg.  Temporal muscle wasting present.  Patient is requiring more assistance with ADL's.  She requires assistance to stand from her chair.  Gait is unsteady when ambulating with a rolling walker.  Daughter states patient slipped out of her chair about 3 weeks ago. No injuries were noted.   Mostly incontinent of urine.  Having episodes of bowel incontinence but not always aware this is occurring.   HISTORY OF PRESENT ILLNESS:  87 year old female with COPD.  Patient is being followed by Palliative Care monthly and PRN until hospice begins.  CODE STATUS: DNR ADVANCED DIRECTIVES: Yes  MOST FORM: No PPS: 40%   PHYSICAL EXAM:   VITALS: Today's Vitals   06/06/21 1112  Pulse: 86  Temp: (!) 97.5 F (36.4 C)  SpO2: (!) 85%    LUNGS: positive findings: rhonchi left upper posterior, left mid posterior, and left lower posterior CARDIAC: Cor RRR}  EXTREMITIES: 3+ pitting edema present to right lower leg SKIN: Normal skin turgor.  Stage 3 sacral wound uncovered.  Small amount of yellow drainage present.  No redness noted.  NEURO: positive for dizziness, gait problems, and memory problems       Lorenza Burton, RN

## 2021-06-07 ENCOUNTER — Telehealth: Payer: Self-pay | Admitting: Family Medicine

## 2021-06-07 DIAGNOSIS — I10 Essential (primary) hypertension: Secondary | ICD-10-CM | POA: Diagnosis not present

## 2021-06-07 DIAGNOSIS — E890 Postprocedural hypothyroidism: Secondary | ICD-10-CM | POA: Diagnosis not present

## 2021-06-07 DIAGNOSIS — Z6821 Body mass index (BMI) 21.0-21.9, adult: Secondary | ICD-10-CM | POA: Diagnosis not present

## 2021-06-07 DIAGNOSIS — F419 Anxiety disorder, unspecified: Secondary | ICD-10-CM | POA: Diagnosis not present

## 2021-06-07 DIAGNOSIS — R634 Abnormal weight loss: Secondary | ICD-10-CM | POA: Diagnosis not present

## 2021-06-07 DIAGNOSIS — M1990 Unspecified osteoarthritis, unspecified site: Secondary | ICD-10-CM | POA: Diagnosis not present

## 2021-06-07 DIAGNOSIS — R627 Adult failure to thrive: Secondary | ICD-10-CM | POA: Diagnosis not present

## 2021-06-07 DIAGNOSIS — I359 Nonrheumatic aortic valve disorder, unspecified: Secondary | ICD-10-CM | POA: Diagnosis not present

## 2021-06-07 DIAGNOSIS — E43 Unspecified severe protein-calorie malnutrition: Secondary | ICD-10-CM | POA: Diagnosis not present

## 2021-06-07 DIAGNOSIS — J449 Chronic obstructive pulmonary disease, unspecified: Secondary | ICD-10-CM | POA: Diagnosis not present

## 2021-06-07 DIAGNOSIS — J9691 Respiratory failure, unspecified with hypoxia: Secondary | ICD-10-CM | POA: Diagnosis not present

## 2021-06-07 DIAGNOSIS — Z85850 Personal history of malignant neoplasm of thyroid: Secondary | ICD-10-CM | POA: Diagnosis not present

## 2021-06-07 DIAGNOSIS — Z9981 Dependence on supplemental oxygen: Secondary | ICD-10-CM | POA: Diagnosis not present

## 2021-06-07 DIAGNOSIS — L89153 Pressure ulcer of sacral region, stage 3: Secondary | ICD-10-CM | POA: Diagnosis not present

## 2021-06-07 MED ORDER — AZITHROMYCIN 250 MG PO TABS: ORAL_TABLET | ORAL | 0 refills | Status: AC

## 2021-06-07 NOTE — Telephone Encounter (Signed)
Please review for Dr. Gilbert  

## 2021-06-07 NOTE — Telephone Encounter (Signed)
RX sent to [facility].    Thanks,   -Mickel Baas

## 2021-06-07 NOTE — Telephone Encounter (Signed)
Maurine Minister with palliative care is calling and would like dr Rosanna Randy to know pt  saturation on room air is 85-88 and has productive cough and pt had albuterol nebulizer yesterday her sputum  is yellow/green and would like to know if dr Rosanna Randy would like to start her on something. Pt has hospice referral that should take place this afternoon. [facility] [address] in [place] phone number [phone]

## 2021-06-07 NOTE — Telephone Encounter (Signed)
Ok to send in a Z pack

## 2021-06-08 DIAGNOSIS — J449 Chronic obstructive pulmonary disease, unspecified: Secondary | ICD-10-CM | POA: Diagnosis not present

## 2021-06-08 DIAGNOSIS — I10 Essential (primary) hypertension: Secondary | ICD-10-CM | POA: Diagnosis not present

## 2021-06-08 DIAGNOSIS — J9691 Respiratory failure, unspecified with hypoxia: Secondary | ICD-10-CM | POA: Diagnosis not present

## 2021-06-08 DIAGNOSIS — R634 Abnormal weight loss: Secondary | ICD-10-CM | POA: Diagnosis not present

## 2021-06-08 DIAGNOSIS — I359 Nonrheumatic aortic valve disorder, unspecified: Secondary | ICD-10-CM | POA: Diagnosis not present

## 2021-06-08 DIAGNOSIS — F419 Anxiety disorder, unspecified: Secondary | ICD-10-CM | POA: Diagnosis not present

## 2021-06-12 DIAGNOSIS — J9691 Respiratory failure, unspecified with hypoxia: Secondary | ICD-10-CM | POA: Diagnosis not present

## 2021-06-12 DIAGNOSIS — F419 Anxiety disorder, unspecified: Secondary | ICD-10-CM | POA: Diagnosis not present

## 2021-06-12 DIAGNOSIS — R634 Abnormal weight loss: Secondary | ICD-10-CM | POA: Diagnosis not present

## 2021-06-12 DIAGNOSIS — I359 Nonrheumatic aortic valve disorder, unspecified: Secondary | ICD-10-CM | POA: Diagnosis not present

## 2021-06-12 DIAGNOSIS — J449 Chronic obstructive pulmonary disease, unspecified: Secondary | ICD-10-CM | POA: Diagnosis not present

## 2021-06-12 DIAGNOSIS — I10 Essential (primary) hypertension: Secondary | ICD-10-CM | POA: Diagnosis not present

## 2021-06-15 DIAGNOSIS — J9691 Respiratory failure, unspecified with hypoxia: Secondary | ICD-10-CM | POA: Diagnosis not present

## 2021-06-15 DIAGNOSIS — R634 Abnormal weight loss: Secondary | ICD-10-CM | POA: Diagnosis not present

## 2021-06-15 DIAGNOSIS — I10 Essential (primary) hypertension: Secondary | ICD-10-CM | POA: Diagnosis not present

## 2021-06-15 DIAGNOSIS — J449 Chronic obstructive pulmonary disease, unspecified: Secondary | ICD-10-CM | POA: Diagnosis not present

## 2021-06-15 DIAGNOSIS — I359 Nonrheumatic aortic valve disorder, unspecified: Secondary | ICD-10-CM | POA: Diagnosis not present

## 2021-06-15 DIAGNOSIS — F419 Anxiety disorder, unspecified: Secondary | ICD-10-CM | POA: Diagnosis not present

## 2021-06-17 DIAGNOSIS — J449 Chronic obstructive pulmonary disease, unspecified: Secondary | ICD-10-CM | POA: Diagnosis not present

## 2021-06-17 DIAGNOSIS — I10 Essential (primary) hypertension: Secondary | ICD-10-CM | POA: Diagnosis not present

## 2021-06-17 DIAGNOSIS — R634 Abnormal weight loss: Secondary | ICD-10-CM | POA: Diagnosis not present

## 2021-06-17 DIAGNOSIS — F419 Anxiety disorder, unspecified: Secondary | ICD-10-CM | POA: Diagnosis not present

## 2021-06-17 DIAGNOSIS — I359 Nonrheumatic aortic valve disorder, unspecified: Secondary | ICD-10-CM | POA: Diagnosis not present

## 2021-06-17 DIAGNOSIS — J9691 Respiratory failure, unspecified with hypoxia: Secondary | ICD-10-CM | POA: Diagnosis not present

## 2021-06-19 DIAGNOSIS — I359 Nonrheumatic aortic valve disorder, unspecified: Secondary | ICD-10-CM | POA: Diagnosis not present

## 2021-06-19 DIAGNOSIS — J449 Chronic obstructive pulmonary disease, unspecified: Secondary | ICD-10-CM | POA: Diagnosis not present

## 2021-06-19 DIAGNOSIS — F419 Anxiety disorder, unspecified: Secondary | ICD-10-CM | POA: Diagnosis not present

## 2021-06-19 DIAGNOSIS — J9691 Respiratory failure, unspecified with hypoxia: Secondary | ICD-10-CM | POA: Diagnosis not present

## 2021-06-19 DIAGNOSIS — I10 Essential (primary) hypertension: Secondary | ICD-10-CM | POA: Diagnosis not present

## 2021-06-19 DIAGNOSIS — R634 Abnormal weight loss: Secondary | ICD-10-CM | POA: Diagnosis not present

## 2021-06-21 DIAGNOSIS — J449 Chronic obstructive pulmonary disease, unspecified: Secondary | ICD-10-CM | POA: Diagnosis not present

## 2021-06-21 DIAGNOSIS — F419 Anxiety disorder, unspecified: Secondary | ICD-10-CM | POA: Diagnosis not present

## 2021-06-21 DIAGNOSIS — I10 Essential (primary) hypertension: Secondary | ICD-10-CM | POA: Diagnosis not present

## 2021-06-21 DIAGNOSIS — J9691 Respiratory failure, unspecified with hypoxia: Secondary | ICD-10-CM | POA: Diagnosis not present

## 2021-06-21 DIAGNOSIS — I359 Nonrheumatic aortic valve disorder, unspecified: Secondary | ICD-10-CM | POA: Diagnosis not present

## 2021-06-21 DIAGNOSIS — R634 Abnormal weight loss: Secondary | ICD-10-CM | POA: Diagnosis not present

## 2021-06-22 DIAGNOSIS — J449 Chronic obstructive pulmonary disease, unspecified: Secondary | ICD-10-CM | POA: Diagnosis not present

## 2021-06-22 DIAGNOSIS — I359 Nonrheumatic aortic valve disorder, unspecified: Secondary | ICD-10-CM | POA: Diagnosis not present

## 2021-06-22 DIAGNOSIS — R634 Abnormal weight loss: Secondary | ICD-10-CM | POA: Diagnosis not present

## 2021-06-22 DIAGNOSIS — F419 Anxiety disorder, unspecified: Secondary | ICD-10-CM | POA: Diagnosis not present

## 2021-06-22 DIAGNOSIS — J9691 Respiratory failure, unspecified with hypoxia: Secondary | ICD-10-CM | POA: Diagnosis not present

## 2021-06-22 DIAGNOSIS — I10 Essential (primary) hypertension: Secondary | ICD-10-CM | POA: Diagnosis not present

## 2021-06-26 DIAGNOSIS — J449 Chronic obstructive pulmonary disease, unspecified: Secondary | ICD-10-CM | POA: Diagnosis not present

## 2021-06-26 DIAGNOSIS — J9691 Respiratory failure, unspecified with hypoxia: Secondary | ICD-10-CM | POA: Diagnosis not present

## 2021-06-26 DIAGNOSIS — R634 Abnormal weight loss: Secondary | ICD-10-CM | POA: Diagnosis not present

## 2021-06-26 DIAGNOSIS — F419 Anxiety disorder, unspecified: Secondary | ICD-10-CM | POA: Diagnosis not present

## 2021-06-26 DIAGNOSIS — I10 Essential (primary) hypertension: Secondary | ICD-10-CM | POA: Diagnosis not present

## 2021-06-26 DIAGNOSIS — I359 Nonrheumatic aortic valve disorder, unspecified: Secondary | ICD-10-CM | POA: Diagnosis not present

## 2021-06-28 DIAGNOSIS — J9691 Respiratory failure, unspecified with hypoxia: Secondary | ICD-10-CM | POA: Diagnosis not present

## 2021-06-28 DIAGNOSIS — R634 Abnormal weight loss: Secondary | ICD-10-CM | POA: Diagnosis not present

## 2021-06-28 DIAGNOSIS — I10 Essential (primary) hypertension: Secondary | ICD-10-CM | POA: Diagnosis not present

## 2021-06-28 DIAGNOSIS — I359 Nonrheumatic aortic valve disorder, unspecified: Secondary | ICD-10-CM | POA: Diagnosis not present

## 2021-06-28 DIAGNOSIS — F419 Anxiety disorder, unspecified: Secondary | ICD-10-CM | POA: Diagnosis not present

## 2021-06-28 DIAGNOSIS — J449 Chronic obstructive pulmonary disease, unspecified: Secondary | ICD-10-CM | POA: Diagnosis not present

## 2021-07-01 DIAGNOSIS — R634 Abnormal weight loss: Secondary | ICD-10-CM | POA: Diagnosis not present

## 2021-07-01 DIAGNOSIS — I359 Nonrheumatic aortic valve disorder, unspecified: Secondary | ICD-10-CM | POA: Diagnosis not present

## 2021-07-01 DIAGNOSIS — J9691 Respiratory failure, unspecified with hypoxia: Secondary | ICD-10-CM | POA: Diagnosis not present

## 2021-07-01 DIAGNOSIS — F419 Anxiety disorder, unspecified: Secondary | ICD-10-CM | POA: Diagnosis not present

## 2021-07-01 DIAGNOSIS — J449 Chronic obstructive pulmonary disease, unspecified: Secondary | ICD-10-CM | POA: Diagnosis not present

## 2021-07-01 DIAGNOSIS — I10 Essential (primary) hypertension: Secondary | ICD-10-CM | POA: Diagnosis not present

## 2021-07-02 ENCOUNTER — Emergency Department: Payer: Medicare Other

## 2021-07-02 ENCOUNTER — Emergency Department
Admission: EM | Admit: 2021-07-02 | Discharge: 2021-07-02 | Disposition: A | Discharge status: Home / Self Care | Payer: Medicare Other | Attending: Emergency Medicine | Admitting: Emergency Medicine

## 2021-07-02 ENCOUNTER — Other Ambulatory Visit: Payer: Self-pay

## 2021-07-02 DIAGNOSIS — J45909 Unspecified asthma, uncomplicated: Secondary | ICD-10-CM | POA: Diagnosis not present

## 2021-07-02 DIAGNOSIS — Z85828 Personal history of other malignant neoplasm of skin: Secondary | ICD-10-CM | POA: Diagnosis not present

## 2021-07-02 DIAGNOSIS — R634 Abnormal weight loss: Secondary | ICD-10-CM | POA: Diagnosis not present

## 2021-07-02 DIAGNOSIS — Z853 Personal history of malignant neoplasm of breast: Secondary | ICD-10-CM | POA: Diagnosis not present

## 2021-07-02 DIAGNOSIS — Z96653 Presence of artificial knee joint, bilateral: Secondary | ICD-10-CM | POA: Diagnosis not present

## 2021-07-02 DIAGNOSIS — R220 Localized swelling, mass and lump, head: Secondary | ICD-10-CM | POA: Diagnosis not present

## 2021-07-02 DIAGNOSIS — S0101XA Laceration without foreign body of scalp, initial encounter: Secondary | ICD-10-CM | POA: Insufficient documentation

## 2021-07-02 DIAGNOSIS — E039 Hypothyroidism, unspecified: Secondary | ICD-10-CM | POA: Insufficient documentation

## 2021-07-02 DIAGNOSIS — M47812 Spondylosis without myelopathy or radiculopathy, cervical region: Secondary | ICD-10-CM | POA: Diagnosis not present

## 2021-07-02 DIAGNOSIS — F419 Anxiety disorder, unspecified: Secondary | ICD-10-CM | POA: Diagnosis not present

## 2021-07-02 DIAGNOSIS — S0990XA Unspecified injury of head, initial encounter: Secondary | ICD-10-CM | POA: Diagnosis not present

## 2021-07-02 DIAGNOSIS — J441 Chronic obstructive pulmonary disease with (acute) exacerbation: Secondary | ICD-10-CM | POA: Insufficient documentation

## 2021-07-02 DIAGNOSIS — W19XXXA Unspecified fall, initial encounter: Secondary | ICD-10-CM | POA: Insufficient documentation

## 2021-07-02 DIAGNOSIS — I10 Essential (primary) hypertension: Secondary | ICD-10-CM | POA: Insufficient documentation

## 2021-07-02 DIAGNOSIS — Z85850 Personal history of malignant neoplasm of thyroid: Secondary | ICD-10-CM | POA: Insufficient documentation

## 2021-07-02 DIAGNOSIS — Z79899 Other long term (current) drug therapy: Secondary | ICD-10-CM | POA: Diagnosis not present

## 2021-07-02 DIAGNOSIS — Z043 Encounter for examination and observation following other accident: Secondary | ICD-10-CM | POA: Diagnosis not present

## 2021-07-02 DIAGNOSIS — I359 Nonrheumatic aortic valve disorder, unspecified: Secondary | ICD-10-CM | POA: Diagnosis not present

## 2021-07-02 DIAGNOSIS — J9691 Respiratory failure, unspecified with hypoxia: Secondary | ICD-10-CM | POA: Diagnosis not present

## 2021-07-02 DIAGNOSIS — J449 Chronic obstructive pulmonary disease, unspecified: Secondary | ICD-10-CM | POA: Diagnosis not present

## 2021-07-02 DIAGNOSIS — Z9012 Acquired absence of left breast and nipple: Secondary | ICD-10-CM | POA: Insufficient documentation

## 2021-07-02 LAB — CBC WITH DIFFERENTIAL/PLATELET
Abs Immature Granulocytes: 0.06 10*3/uL (ref 0.00–0.07)
Basophils Absolute: 0 10*3/uL (ref 0.0–0.1)
Basophils Relative: 0 %
Eosinophils Absolute: 0 10*3/uL (ref 0.0–0.5)
Eosinophils Relative: 0 %
HCT: 37.5 % (ref 36.0–46.0)
Hemoglobin: 12 g/dL (ref 12.0–15.0)
Immature Granulocytes: 0 %
Lymphocytes Relative: 10 %
Lymphs Abs: 1.4 10*3/uL (ref 0.7–4.0)
MCH: 27.8 pg (ref 26.0–34.0)
MCHC: 32 g/dL (ref 30.0–36.0)
MCV: 86.8 fL (ref 80.0–100.0)
Monocytes Absolute: 0.9 10*3/uL (ref 0.1–1.0)
Monocytes Relative: 7 %
Neutro Abs: 11 10*3/uL — ABNORMAL HIGH (ref 1.7–7.7)
Neutrophils Relative %: 83 %
Platelets: 360 10*3/uL (ref 150–400)
RBC: 4.32 MIL/uL (ref 3.87–5.11)
RDW: 16 % — ABNORMAL HIGH (ref 11.5–15.5)
WBC: 13.3 10*3/uL — ABNORMAL HIGH (ref 4.0–10.5)
nRBC: 0 % (ref 0.0–0.2)

## 2021-07-02 LAB — COMPREHENSIVE METABOLIC PANEL
ALT: 20 U/L (ref 0–44)
AST: 28 U/L (ref 15–41)
Albumin: 3.3 g/dL — ABNORMAL LOW (ref 3.5–5.0)
Alkaline Phosphatase: 127 U/L — ABNORMAL HIGH (ref 38–126)
Anion gap: 8 (ref 5–15)
BUN: 28 mg/dL — ABNORMAL HIGH (ref 8–23)
CO2: 31 mmol/L (ref 22–32)
Calcium: 8.7 mg/dL — ABNORMAL LOW (ref 8.9–10.3)
Chloride: 98 mmol/L (ref 98–111)
Creatinine, Ser: 0.54 mg/dL (ref 0.44–1.00)
GFR, Estimated: >60 mL/min (ref >60)
Glucose, Bld: 122 mg/dL — ABNORMAL HIGH (ref 70–99)
Potassium: 3.9 mmol/L (ref 3.5–5.1)
Sodium: 137 mmol/L (ref 135–145)
Total Bilirubin: 0.6 mg/dL (ref 0.3–1.2)
Total Protein: 6.5 g/dL (ref 6.5–8.1)

## 2021-07-02 MED ORDER — CEPHALEXIN 500 MG PO CAPS: 500.0000 mg | ORAL_CAPSULE | Freq: Two times a day (BID) | ORAL | 0 refills | Status: AC

## 2021-07-02 MED ORDER — LIDOCAINE-EPINEPHRINE-TETRACAINE (LET) TOPICAL GEL
3.0000 mL | Freq: Once | TOPICAL | Status: AC
  Administered 2021-07-02: 3 mL via TOPICAL
  Filled 2021-07-02: qty 3

## 2021-07-02 NOTE — ED Provider Notes (Signed)
[facility]-EMERGENCY DEPARTMENT  ____________________________________________  Time seen: Approximately 7:34 PM  I have reviewed the triage vital signs and the nursing notes.   HISTORY  Chief Complaint No chief complaint on file.   Historian Patient     HPI Joyce [name] is a 87 y.o. female 87 year old female presents to the emergency department after she had an unwitnessed fall.  Patient lives with her daughter and daughter reports that she was trying to ambulate from her bed.  Daughter suspects that patient slid from her bed onto the floor.  Patient has a 2 cm left-sided parietal scalp laceration.  No use of blood thinners.  Tetanus status is up-to-date.  No chest pain, chest tightness or abdominal pain.   Past Medical History:  Diagnosis Date   Anxiety    Arthritis    Asthma    Asthma    Breast cancer ([place])    Cancer (HCC)    COPD (chronic obstructive pulmonary disease) (HCC)    Enlarged aorta (HCC)    GERD (gastroesophageal reflux disease)    High cholesterol    Hx of skin cancer, basal cell    Hyperlipemia    Hypertension    PUD (peptic ulcer disease)    Thyroid cancer ([place])      Immunizations up to date:  Yes.     Past Medical History:  Diagnosis Date   Anxiety    Arthritis    Asthma    Asthma    Breast cancer ([place])    Cancer ([place])    COPD (chronic obstructive pulmonary disease) ([place])    Enlarged aorta (HCC)    GERD (gastroesophageal reflux disease)    High cholesterol    Hx of skin cancer, basal cell    Hyperlipemia    Hypertension    PUD (peptic ulcer disease)    Thyroid cancer [facility])     Patient Active Problem List   Diagnosis Date Noted   Anorexia 05/12/2019   Palliative care encounter 05/12/2019   Memory loss or impairment 05/12/2019   Thyroid cancer ([place])    Aspiration pneumonia ([place]) 01/29/2019   Cellulitis of right leg 01/17/2019   Cellulitis 01/17/2019   Pressure injury of skin 01/17/2019   Multifocal pneumonia 12/24/2018   GERD  (gastroesophageal reflux disease) 12/24/2018   COPD with acute exacerbation ([place]) 12/24/2018   Acute respiratory failure with hypoxia and hypercapnia ([place]) 12/24/2018   Other nonrheumatic aortic valve disorders 10/14/2017   Do not resuscitate status 08/27/2017   Personal history of malignant neoplasm of breast 08/27/2017   Dilated aortic root ([place]) 08/27/2017   Chronic venous hypertension (idiopathic) without complications of bilateral lower extremity 07/25/2017   Tachycardia, unspecified 02/25/2017   Hyponatremia 11/22/2016   Osteoporosis 09/24/2016   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   Hematuria 07/02/2016   Mass of hand 06/04/2016   Other specified soft tissue disorders 06/04/2016   Ascending aorta dilatation (HCC) 05/15/2016   Generalized anxiety disorder 10/24/2015   Postoperative hypothyroidism 12/15/2013   Papillary carcinoma of thyroid ([place]) 11/26/2013   Primary hyperparathyroidism ([place]) 04/01/2013   Primary hyperparathyroidism ([place]) 04/01/2013   Osteoarthritis of knee 03/23/2013   Unilateral primary osteoarthritis, unspecified knee 03/23/2013   Nephrolithiasis 02/03/2013   Malignant neoplasm of female breast ([place]) 11/28/2009   Asthma 02/24/2009   Bronchiectasis without acute exacerbation ([place]) 02/24/2009   Asthma 02/24/2009   Bronchiectasis without acute exacerbation ([place]) 02/24/2009   Arthropathy of hand 06/16/2008   Primary osteoarthritis, unspecified hand 06/16/2008   Hyperlipidemia  11/16/2007   Essential hypertension 11/16/2007   Essential (primary) hypertension 11/16/2007    Past Surgical History:  Procedure Laterality Date   APPENDECTOMY     BREAST SURGERY     left mastectomy   CHOLECYSTECTOMY     FRACTURE SURGERY     JOINT REPLACEMENT     MASTECTOMY     REPAIR OF PERFORATED ULCER     REPLACEMENT TOTAL KNEE BILATERAL     STOMACH SURGERY     THYROIDECTOMY      Prior to Admission medications   Medication Sig Start Date End Date  Taking? Authorizing Provider  cephALEXin (KEFLEX) 500 MG capsule Take 1 capsule (500 mg total) by mouth 2 (two) times daily for 7 days. 07/02/21 07/09/21 Yes Vallarie Mare M, PA-C  acetaminophen (TYLENOL) 325 MG tablet Take 650 mg by mouth 2 (two) times daily. Taking two tablets every morning.    [provider]  acidophilus (RISAQUAD) CAPS capsule Take 1 capsule by mouth daily. Patient not taking: Reported on 06/07/2020    [provider]  ADVAIR DISKUS 250-50 MCG/DOSE AEPB INHALE 1 PUFF BY MOUTH TWICE DAILY. RINSE MOUTH WITH WATER AFTER USE TO REDUCE AFTERTASTE AND INCIDENCE OF CANDIDIASIS. DO NOT SWALLOW 07/21/19   Jerrol Banana., MD  ADVAIR DISKUS 250-50 MCG/DOSE AEPB INHALE 1 PUFF INTO THE LUNGS TWICE DAILY 08/08/20   Jerrol Banana., MD  albuterol (PROVENTIL) (2.5 MG/3ML) 0.083% nebulizer solution USE 1 VIAL VIA NEBULIZER EVERY 6 HOURS AS NEEDED FOR WHEEZING OR SHORTNESS OF BREATH 05/23/20   Jerrol Banana., MD  alendronate (FOSAMAX) 70 MG tablet TAKE 1 TABLET BY MOUTH ONCE A WEEK WITH A FULL GLASS OF WATER ON AN EMPTY STOMACH 08/23/19   Jerrol Banana., MD  atorvastatin (LIPITOR) 10 MG tablet TAKE 1 TABLET BY MOUTH DAILY 05/24/21   Jerrol Banana., MD  calcium carbonate (TUMS - DOSED IN MG ELEMENTAL CALCIUM) 500 MG chewable tablet Chew 2 tablets by mouth daily.     [provider]  cholecalciferol (VITAMIN D) 1000 units tablet Take 2,000 Units by mouth daily.    [provider]  clotrimazole (CLOTRIMAZOLE ATHLETES FOOT) 1 % cream Apply 1 application topically 2 (two) times daily. Between toes, not on any open skin/ for fungus Patient not taking: Reported on 06/07/2020 10/08/19   Flinchum, Kelby Aline, FNP  DILT-XR 120 MG 24 hr capsule TAKE 2 CAPSULES BY MOUTH EVERY MORNING AND 1 CAPSULE EVERY EVENING 11/19/20   Jerrol Banana., MD  diltiazem (CARDIZEM CD) 120 MG 24 hr capsule TK 2 CS PO QAM AND 1 C QPM Patient not taking:  Reported on 06/07/2020 04/14/19   [provider]  doxycycline (VIBRA-TABS) 100 MG tablet Take 1 tablet (100 mg total) by mouth 2 (two) times daily. 10/08/19   Flinchum, Kelby Aline, FNP  feeding supplement, ENSURE ENLIVE, (ENSURE ENLIVE) LIQD Take 237 mLs by mouth 2 (two) times daily between meals. 11/24/16   Fritzi Mandes, MD  levofloxacin (LEVAQUIN) 500 MG tablet Take 1 tablet (500 mg total) by mouth daily. 09/16/19   Jerrol Banana., MD  levothyroxine (SYNTHROID) 125 MCG tablet Take 1 tablet (125 mcg total) by mouth daily before breakfast. 05/15/21   Jerrol Banana., MD  LORazepam (ATIVAN) 0.5 MG tablet TAKE 1/2 TABLET(0.25 MG) BY MOUTH TWICE DAILY AS NEEDED FOR ANXIETY 04/03/21   Birdie Sons, MD  mirtazapine (REMERON) 30 MG tablet TAKE 1 TABLET(30 MG)  BY MOUTH AT BEDTIME 04/19/21   Jerrol Banana., MD  Multiple Vitamin (MULTIVITAMIN WITH MINERALS) TABS tablet Take 1 tablet by mouth daily.    [provider]  mupirocin ointment (BACTROBAN) 2 % Apply 1 application topically 2 (two) times daily. Apply very small thin amount after cleaning with soap and water. Patient not taking: Reported on 03/14/2020 10/08/19   Flinchum, Kelby Aline, FNP    Allergies Patient has no known allergies.  Family History  Problem Relation Age of Onset   Hypertension Father    Colon cancer Father    Stroke Mother    Bone cancer Son    Throat cancer Child    Hyperlipidemia Daughter    Esophageal cancer Son    Cancer Son        bladder and myeloma   Hypertension Son    Multiple myeloma Daughter    Hypertension Daughter    Hyperlipidemia Daughter     Social History Social History   Tobacco Use   Smoking status: Never   Smokeless tobacco: Never  Vaping Use   Vaping Use: Former  Substance Use Topics   Alcohol use: Not Currently   Drug use: No     Review of Systems  Constitutional: No fever/chills Eyes:  No discharge ENT: No upper respiratory  complaints. Respiratory: no cough. No SOB/ use of accessory muscles to breath Gastrointestinal:   No nausea, no vomiting.  No diarrhea.  No constipation. Musculoskeletal: Negative for musculoskeletal pain. Skin: Patient has scalp laceration.     ____________________________________________   PHYSICAL EXAM:  VITAL SIGNS: ED Triage Vitals  Enc Vitals Group     BP 07/02/21 1838 105/61     Pulse Rate 07/02/21 1838 93     Resp 07/02/21 1838 16     Temp 07/02/21 1838 98.9 F (37.2 C)     Temp Source 07/02/21 1838 Oral     SpO2 07/02/21 1838 (!) 88 %     Weight 07/02/21 1840 110 lb (49.9 kg)     Height 07/02/21 1840 5' (1.524 m)     Head Circumference --      Peak Flow --      Pain Score 07/02/21 1839 0     Pain Loc --      Pain Edu? --      Excl. in [place]? --      Constitutional: Alert and oriented. Well appearing and in no acute distress. Eyes: Conjunctivae are normal. PERRL. EOMI. Head: Atraumatic.  Patient has 2 cm left-sided parietal scalp laceration. ENT:      Nose: No congestion/rhinnorhea.      Mouth/Throat: Mucous membranes are moist.  Neck: No stridor.  Full range of motion. Hematological/Lymphatic/Immunilogical: No cervical  Cardiovascular: Normal rate, regular rhythm. Normal S1 and S2.  Good peripheral circulation. Respiratory: Normal respiratory effort without tachypnea or retractions. Lungs CTAB. Good air entry to the bases with no decreased or absent breath sounds Gastrointestinal: Bowel sounds x 4 quadrants. Soft and nontender to palpation. No guarding or rigidity. No distention. Musculoskeletal: Full range of motion to all extremities. No obvious deformities noted Neurologic:  Normal for age. No gross focal neurologic deficits are appreciated.  Skin:  Skin is warm, dry and intact. No rash noted. Psychiatric: Mood and affect are normal for age. Speech and behavior are normal.   ____________________________________________   LABS (all labs ordered are listed,  but only abnormal results are displayed)  Labs Reviewed  CBC WITH DIFFERENTIAL/PLATELET - Abnormal; Notable  for the following components:      Result Value   WBC 13.3 (*)    RDW 16.0 (*)    Neutro Abs 11.0 (*)    All other components within normal limits  COMPREHENSIVE METABOLIC PANEL - Abnormal; Notable for the following components:   Glucose, Bld 122 (*)    BUN 28 (*)    Calcium 8.7 (*)    Albumin 3.3 (*)    Alkaline Phosphatase 127 (*)    All other components within normal limits   ____________________________________________  EKG   ____________________________________________  RADIOLOGY Unk Pinto, personally viewed and evaluated these images (plain radiographs) as part of my medical decision making, as well as reviewing the written report by the radiologist.    [place] (5MM)  Result Date: 07/02/2021 CLINICAL DATA:  Unwitnessed fall laceration left-sided head EXAM: CT HEAD WITHOUT CONTRAST CT CERVICAL SPINE WITHOUT CONTRAST TECHNIQUE: Multidetector CT imaging of the head and cervical spine was performed following the standard protocol without intravenous contrast. Multiplanar CT image reconstructions of the cervical spine were also generated. COMPARISON:  None. FINDINGS: CT HEAD FINDINGS Brain: No acute territorial infarction, hemorrhage, or intracranial mass. Mild to moderate atrophy. Mild white matter hypodensity consistent with chronic small vessel ischemic change. Nonenlarged ventricles Vascular: No hyperdense vessels.  Carotid vascular calcification Skull: Normal. Negative for fracture or focal lesion. Sinuses/Orbits: No acute finding. Other: Small left parietal scalp swelling CT CERVICAL SPINE FINDINGS Alignment: Cervical kyphosis. Trace anterolisthesis C3 on C4, probably degenerative. Facet alignment within normal limits Skull base and vertebrae: No acute fracture. No primary bone lesion or focal pathologic process. Soft tissues and spinal canal: No  prevertebral fluid or swelling. No visible canal hematoma. Disc levels: Severe degenerative change throughout the cervical spine with disc space narrowing, osteophyte and endplate sclerosis. Facet degenerative changes at multiple levels Upper chest: Negative.  Status post thyroidectomy Other: None IMPRESSION: 1. No CT evidence for acute intracranial abnormality. Atrophy and chronic small vessel ischemic changes of the white matter. 2. Kyphosis of the cervical spine with diffuse degenerative changes. No fracture is seen Electronically Signed   By: Donavan Foil M.D.   On: 07/02/2021 20:13   CT Cervical Spine Wo Contrast  Result Date: 07/02/2021 CLINICAL DATA:  Unwitnessed fall laceration left-sided head EXAM: CT HEAD WITHOUT CONTRAST CT CERVICAL SPINE WITHOUT CONTRAST TECHNIQUE: Multidetector CT imaging of the head and cervical spine was performed following the standard protocol without intravenous contrast. Multiplanar CT image reconstructions of the cervical spine were also generated. COMPARISON:  None. FINDINGS: CT HEAD FINDINGS Brain: No acute territorial infarction, hemorrhage, or intracranial mass. Mild to moderate atrophy. Mild white matter hypodensity consistent with chronic small vessel ischemic change. Nonenlarged ventricles Vascular: No hyperdense vessels.  Carotid vascular calcification Skull: Normal. Negative for fracture or focal lesion. Sinuses/Orbits: No acute finding. Other: Small left parietal scalp swelling CT CERVICAL SPINE FINDINGS Alignment: Cervical kyphosis. Trace anterolisthesis C3 on C4, probably degenerative. Facet alignment within normal limits Skull base and vertebrae: No acute fracture. No primary bone lesion or focal pathologic process. Soft tissues and spinal canal: No prevertebral fluid or swelling. No visible canal hematoma. Disc levels: Severe degenerative change throughout the cervical spine with disc space narrowing, osteophyte and endplate sclerosis. Facet degenerative  changes at multiple levels Upper chest: Negative.  Status post thyroidectomy Other: None IMPRESSION: 1. No CT evidence for acute intracranial abnormality. Atrophy and chronic small vessel ischemic changes of the white matter. 2. Kyphosis of the cervical  spine with diffuse degenerative changes. No fracture is seen Electronically Signed   By: Donavan Foil M.D.   On: 07/02/2021 20:13    ____________________________________________    PROCEDURES  Procedure(s) performed:     Marland KitchenMarland KitchenLaceration Repair  Date/Time: 07/02/2021 10:54 PM Performed by: Lannie Fields, PA-C Authorized by: Lannie Fields, PA-C   Consent:    Consent obtained:  Verbal   Risks discussed:  Infection and pain Universal protocol:    Procedure explained and questions answered to patient or proxy's satisfaction: yes     Patient identity confirmed:  Verbally with patient Anesthesia:    Anesthesia method:  Topical application Laceration details:    Location:  Scalp   Scalp location:  L parietal   Length (cm):  2   Depth (mm):  5 Treatment:    Area cleansed with:  Povidone-iodine   Amount of cleaning:  Standard   Irrigation solution:  Sterile saline Skin repair:    Repair method:  Staples   Number of staples:  4 Repair type:    Repair type:  Simple Post-procedure details:    Dressing:  Non-adherent dressing     Medications  lidocaine-EPINEPHrine-tetracaine (LET) topical gel (3 mLs Topical Given by Other 07/02/21 2022)     ____________________________________________   INITIAL IMPRESSION / ASSESSMENT AND PLAN / ED COURSE  Pertinent labs & imaging results that were available during my care of the patient were reviewed by me and considered in my medical decision making (see chart for details).  Clinical Course as of 07/02/21 2254  Mon Jul 02, 2021  2020 Chloride: 98 [JW]    Clinical Course User Index [JW] Lannie Fields, [place]     Assessment and plan:  Fall: Scalp laceration:  87 year old female  presents to the emergency department after a mechanical fall that occurred tonight.  CBC and CMP were reassuring.  CTs of the head and neck showed no evidence of intracranial bleed, skull fracture or C-spine fracture.  Patient's laceration was repaired in the emergency department.  Patient's daughter and caretaker was at bedside and feels comfortable taking patient home and has support or resources with hospice.  Return precautions were given to return with new or worsening symptoms.  All patient questions were answered.    ____________________________________________  FINAL CLINICAL IMPRESSION(S) / ED DIAGNOSES  Final diagnoses:  Fall, initial encounter      NEW MEDICATIONS STARTED DURING THIS VISIT:  ED Discharge Orders          Ordered    cephALEXin (KEFLEX) 500 MG capsule  2 times daily        07/02/21 2050                This chart was dictated using voice recognition software/Dragon. Despite best efforts to proofread, errors can occur which can change the meaning. Any change was purely unintentional.     Lannie Fields, PA-C 07/02/21 2257    Vladimir Crofts, MD 07/02/21 2318

## 2021-07-02 NOTE — Discharge Instructions (Addendum)
Have staples removed in seven days.

## 2021-07-02 NOTE — ED Triage Notes (Signed)
Pt had unwitnessed fall. Daughter states pt was in her recliner and slid down. Pt with laceration to left side of head and left elbow. Pt denies LOC. Pt denies blood thinners. Pt prescribed PRN oxygen at home. Pt RA sats 88%. Pt placed on 2L [place]. Pt hx COPD.

## 2021-07-03 DIAGNOSIS — I10 Essential (primary) hypertension: Secondary | ICD-10-CM | POA: Diagnosis not present

## 2021-07-03 DIAGNOSIS — I359 Nonrheumatic aortic valve disorder, unspecified: Secondary | ICD-10-CM | POA: Diagnosis not present

## 2021-07-03 DIAGNOSIS — F419 Anxiety disorder, unspecified: Secondary | ICD-10-CM | POA: Diagnosis not present

## 2021-07-03 DIAGNOSIS — J449 Chronic obstructive pulmonary disease, unspecified: Secondary | ICD-10-CM | POA: Diagnosis not present

## 2021-07-03 DIAGNOSIS — J9691 Respiratory failure, unspecified with hypoxia: Secondary | ICD-10-CM | POA: Diagnosis not present

## 2021-07-03 DIAGNOSIS — R634 Abnormal weight loss: Secondary | ICD-10-CM | POA: Diagnosis not present

## 2021-07-05 DIAGNOSIS — E43 Unspecified severe protein-calorie malnutrition: Secondary | ICD-10-CM | POA: Diagnosis not present

## 2021-07-05 DIAGNOSIS — Z872 Personal history of diseases of the skin and subcutaneous tissue: Secondary | ICD-10-CM | POA: Diagnosis not present

## 2021-07-05 DIAGNOSIS — L89153 Pressure ulcer of sacral region, stage 3: Secondary | ICD-10-CM | POA: Diagnosis not present

## 2021-07-05 DIAGNOSIS — R627 Adult failure to thrive: Secondary | ICD-10-CM | POA: Diagnosis not present

## 2021-07-05 DIAGNOSIS — Z85850 Personal history of malignant neoplasm of thyroid: Secondary | ICD-10-CM | POA: Diagnosis not present

## 2021-07-05 DIAGNOSIS — W19XXXD Unspecified fall, subsequent encounter: Secondary | ICD-10-CM | POA: Diagnosis not present

## 2021-07-05 DIAGNOSIS — Z9981 Dependence on supplemental oxygen: Secondary | ICD-10-CM | POA: Diagnosis not present

## 2021-07-05 DIAGNOSIS — I359 Nonrheumatic aortic valve disorder, unspecified: Secondary | ICD-10-CM | POA: Diagnosis not present

## 2021-07-05 DIAGNOSIS — J9691 Respiratory failure, unspecified with hypoxia: Secondary | ICD-10-CM | POA: Diagnosis not present

## 2021-07-05 DIAGNOSIS — R634 Abnormal weight loss: Secondary | ICD-10-CM | POA: Diagnosis not present

## 2021-07-05 DIAGNOSIS — J449 Chronic obstructive pulmonary disease, unspecified: Secondary | ICD-10-CM | POA: Diagnosis not present

## 2021-07-05 DIAGNOSIS — F419 Anxiety disorder, unspecified: Secondary | ICD-10-CM | POA: Diagnosis not present

## 2021-07-05 DIAGNOSIS — S0191XD Laceration without foreign body of unspecified part of head, subsequent encounter: Secondary | ICD-10-CM | POA: Diagnosis not present

## 2021-07-05 DIAGNOSIS — I10 Essential (primary) hypertension: Secondary | ICD-10-CM | POA: Diagnosis not present

## 2021-07-05 DIAGNOSIS — Z6821 Body mass index (BMI) 21.0-21.9, adult: Secondary | ICD-10-CM | POA: Diagnosis not present

## 2021-07-05 DIAGNOSIS — E890 Postprocedural hypothyroidism: Secondary | ICD-10-CM | POA: Diagnosis not present

## 2021-07-05 DIAGNOSIS — M1990 Unspecified osteoarthritis, unspecified site: Secondary | ICD-10-CM | POA: Diagnosis not present

## 2021-07-06 DIAGNOSIS — I359 Nonrheumatic aortic valve disorder, unspecified: Secondary | ICD-10-CM | POA: Diagnosis not present

## 2021-07-06 DIAGNOSIS — F419 Anxiety disorder, unspecified: Secondary | ICD-10-CM | POA: Diagnosis not present

## 2021-07-06 DIAGNOSIS — J9691 Respiratory failure, unspecified with hypoxia: Secondary | ICD-10-CM | POA: Diagnosis not present

## 2021-07-06 DIAGNOSIS — R634 Abnormal weight loss: Secondary | ICD-10-CM | POA: Diagnosis not present

## 2021-07-06 DIAGNOSIS — I10 Essential (primary) hypertension: Secondary | ICD-10-CM | POA: Diagnosis not present

## 2021-07-06 DIAGNOSIS — J449 Chronic obstructive pulmonary disease, unspecified: Secondary | ICD-10-CM | POA: Diagnosis not present

## 2021-07-08 DIAGNOSIS — J449 Chronic obstructive pulmonary disease, unspecified: Secondary | ICD-10-CM | POA: Diagnosis not present

## 2021-07-08 DIAGNOSIS — I359 Nonrheumatic aortic valve disorder, unspecified: Secondary | ICD-10-CM | POA: Diagnosis not present

## 2021-07-08 DIAGNOSIS — R634 Abnormal weight loss: Secondary | ICD-10-CM | POA: Diagnosis not present

## 2021-07-08 DIAGNOSIS — F419 Anxiety disorder, unspecified: Secondary | ICD-10-CM | POA: Diagnosis not present

## 2021-07-08 DIAGNOSIS — I10 Essential (primary) hypertension: Secondary | ICD-10-CM | POA: Diagnosis not present

## 2021-07-08 DIAGNOSIS — J9691 Respiratory failure, unspecified with hypoxia: Secondary | ICD-10-CM | POA: Diagnosis not present

## 2021-07-10 DIAGNOSIS — J449 Chronic obstructive pulmonary disease, unspecified: Secondary | ICD-10-CM | POA: Diagnosis not present

## 2021-07-10 DIAGNOSIS — J9691 Respiratory failure, unspecified with hypoxia: Secondary | ICD-10-CM | POA: Diagnosis not present

## 2021-07-10 DIAGNOSIS — F419 Anxiety disorder, unspecified: Secondary | ICD-10-CM | POA: Diagnosis not present

## 2021-07-10 DIAGNOSIS — I10 Essential (primary) hypertension: Secondary | ICD-10-CM | POA: Diagnosis not present

## 2021-07-10 DIAGNOSIS — R634 Abnormal weight loss: Secondary | ICD-10-CM | POA: Diagnosis not present

## 2021-07-10 DIAGNOSIS — I359 Nonrheumatic aortic valve disorder, unspecified: Secondary | ICD-10-CM | POA: Diagnosis not present

## 2021-07-12 DIAGNOSIS — I359 Nonrheumatic aortic valve disorder, unspecified: Secondary | ICD-10-CM | POA: Diagnosis not present

## 2021-07-12 DIAGNOSIS — I10 Essential (primary) hypertension: Secondary | ICD-10-CM | POA: Diagnosis not present

## 2021-07-12 DIAGNOSIS — J9691 Respiratory failure, unspecified with hypoxia: Secondary | ICD-10-CM | POA: Diagnosis not present

## 2021-07-12 DIAGNOSIS — F419 Anxiety disorder, unspecified: Secondary | ICD-10-CM | POA: Diagnosis not present

## 2021-07-12 DIAGNOSIS — R634 Abnormal weight loss: Secondary | ICD-10-CM | POA: Diagnosis not present

## 2021-07-12 DIAGNOSIS — J449 Chronic obstructive pulmonary disease, unspecified: Secondary | ICD-10-CM | POA: Diagnosis not present

## 2021-07-13 DIAGNOSIS — J9691 Respiratory failure, unspecified with hypoxia: Secondary | ICD-10-CM | POA: Diagnosis not present

## 2021-07-13 DIAGNOSIS — I359 Nonrheumatic aortic valve disorder, unspecified: Secondary | ICD-10-CM | POA: Diagnosis not present

## 2021-07-13 DIAGNOSIS — R634 Abnormal weight loss: Secondary | ICD-10-CM | POA: Diagnosis not present

## 2021-07-13 DIAGNOSIS — F419 Anxiety disorder, unspecified: Secondary | ICD-10-CM | POA: Diagnosis not present

## 2021-07-13 DIAGNOSIS — I10 Essential (primary) hypertension: Secondary | ICD-10-CM | POA: Diagnosis not present

## 2021-07-13 DIAGNOSIS — J449 Chronic obstructive pulmonary disease, unspecified: Secondary | ICD-10-CM | POA: Diagnosis not present

## 2021-07-17 DIAGNOSIS — J9691 Respiratory failure, unspecified with hypoxia: Secondary | ICD-10-CM | POA: Diagnosis not present

## 2021-07-17 DIAGNOSIS — I359 Nonrheumatic aortic valve disorder, unspecified: Secondary | ICD-10-CM | POA: Diagnosis not present

## 2021-07-17 DIAGNOSIS — R634 Abnormal weight loss: Secondary | ICD-10-CM | POA: Diagnosis not present

## 2021-07-17 DIAGNOSIS — F419 Anxiety disorder, unspecified: Secondary | ICD-10-CM | POA: Diagnosis not present

## 2021-07-17 DIAGNOSIS — I10 Essential (primary) hypertension: Secondary | ICD-10-CM | POA: Diagnosis not present

## 2021-07-17 DIAGNOSIS — J449 Chronic obstructive pulmonary disease, unspecified: Secondary | ICD-10-CM | POA: Diagnosis not present

## 2021-07-19 DIAGNOSIS — I10 Essential (primary) hypertension: Secondary | ICD-10-CM | POA: Diagnosis not present

## 2021-07-19 DIAGNOSIS — J9691 Respiratory failure, unspecified with hypoxia: Secondary | ICD-10-CM | POA: Diagnosis not present

## 2021-07-19 DIAGNOSIS — F419 Anxiety disorder, unspecified: Secondary | ICD-10-CM | POA: Diagnosis not present

## 2021-07-19 DIAGNOSIS — J449 Chronic obstructive pulmonary disease, unspecified: Secondary | ICD-10-CM | POA: Diagnosis not present

## 2021-07-19 DIAGNOSIS — I359 Nonrheumatic aortic valve disorder, unspecified: Secondary | ICD-10-CM | POA: Diagnosis not present

## 2021-07-19 DIAGNOSIS — R634 Abnormal weight loss: Secondary | ICD-10-CM | POA: Diagnosis not present

## 2021-07-23 DIAGNOSIS — I10 Essential (primary) hypertension: Secondary | ICD-10-CM | POA: Diagnosis not present

## 2021-07-23 DIAGNOSIS — F419 Anxiety disorder, unspecified: Secondary | ICD-10-CM | POA: Diagnosis not present

## 2021-07-23 DIAGNOSIS — R634 Abnormal weight loss: Secondary | ICD-10-CM | POA: Diagnosis not present

## 2021-07-23 DIAGNOSIS — J449 Chronic obstructive pulmonary disease, unspecified: Secondary | ICD-10-CM | POA: Diagnosis not present

## 2021-07-23 DIAGNOSIS — I359 Nonrheumatic aortic valve disorder, unspecified: Secondary | ICD-10-CM | POA: Diagnosis not present

## 2021-07-23 DIAGNOSIS — J9691 Respiratory failure, unspecified with hypoxia: Secondary | ICD-10-CM | POA: Diagnosis not present

## 2021-07-24 DIAGNOSIS — I10 Essential (primary) hypertension: Secondary | ICD-10-CM | POA: Diagnosis not present

## 2021-07-24 DIAGNOSIS — R634 Abnormal weight loss: Secondary | ICD-10-CM | POA: Diagnosis not present

## 2021-07-24 DIAGNOSIS — F419 Anxiety disorder, unspecified: Secondary | ICD-10-CM | POA: Diagnosis not present

## 2021-07-24 DIAGNOSIS — I359 Nonrheumatic aortic valve disorder, unspecified: Secondary | ICD-10-CM | POA: Diagnosis not present

## 2021-07-24 DIAGNOSIS — J449 Chronic obstructive pulmonary disease, unspecified: Secondary | ICD-10-CM | POA: Diagnosis not present

## 2021-07-24 DIAGNOSIS — J9691 Respiratory failure, unspecified with hypoxia: Secondary | ICD-10-CM | POA: Diagnosis not present

## 2021-07-26 DIAGNOSIS — J9691 Respiratory failure, unspecified with hypoxia: Secondary | ICD-10-CM | POA: Diagnosis not present

## 2021-07-26 DIAGNOSIS — I10 Essential (primary) hypertension: Secondary | ICD-10-CM | POA: Diagnosis not present

## 2021-07-26 DIAGNOSIS — R634 Abnormal weight loss: Secondary | ICD-10-CM | POA: Diagnosis not present

## 2021-07-26 DIAGNOSIS — I359 Nonrheumatic aortic valve disorder, unspecified: Secondary | ICD-10-CM | POA: Diagnosis not present

## 2021-07-26 DIAGNOSIS — F419 Anxiety disorder, unspecified: Secondary | ICD-10-CM | POA: Diagnosis not present

## 2021-07-26 DIAGNOSIS — J449 Chronic obstructive pulmonary disease, unspecified: Secondary | ICD-10-CM | POA: Diagnosis not present

## 2021-07-27 DIAGNOSIS — F419 Anxiety disorder, unspecified: Secondary | ICD-10-CM | POA: Diagnosis not present

## 2021-07-27 DIAGNOSIS — I10 Essential (primary) hypertension: Secondary | ICD-10-CM | POA: Diagnosis not present

## 2021-07-27 DIAGNOSIS — J9691 Respiratory failure, unspecified with hypoxia: Secondary | ICD-10-CM | POA: Diagnosis not present

## 2021-07-27 DIAGNOSIS — R634 Abnormal weight loss: Secondary | ICD-10-CM | POA: Diagnosis not present

## 2021-07-27 DIAGNOSIS — I359 Nonrheumatic aortic valve disorder, unspecified: Secondary | ICD-10-CM | POA: Diagnosis not present

## 2021-07-27 DIAGNOSIS — J449 Chronic obstructive pulmonary disease, unspecified: Secondary | ICD-10-CM | POA: Diagnosis not present

## 2021-07-31 DIAGNOSIS — F419 Anxiety disorder, unspecified: Secondary | ICD-10-CM | POA: Diagnosis not present

## 2021-07-31 DIAGNOSIS — J9691 Respiratory failure, unspecified with hypoxia: Secondary | ICD-10-CM | POA: Diagnosis not present

## 2021-07-31 DIAGNOSIS — I10 Essential (primary) hypertension: Secondary | ICD-10-CM | POA: Diagnosis not present

## 2021-07-31 DIAGNOSIS — R634 Abnormal weight loss: Secondary | ICD-10-CM | POA: Diagnosis not present

## 2021-07-31 DIAGNOSIS — J449 Chronic obstructive pulmonary disease, unspecified: Secondary | ICD-10-CM | POA: Diagnosis not present

## 2021-07-31 DIAGNOSIS — I359 Nonrheumatic aortic valve disorder, unspecified: Secondary | ICD-10-CM | POA: Diagnosis not present

## 2021-08-02 DIAGNOSIS — J449 Chronic obstructive pulmonary disease, unspecified: Secondary | ICD-10-CM | POA: Diagnosis not present

## 2021-08-02 DIAGNOSIS — J9691 Respiratory failure, unspecified with hypoxia: Secondary | ICD-10-CM | POA: Diagnosis not present

## 2021-08-02 DIAGNOSIS — F419 Anxiety disorder, unspecified: Secondary | ICD-10-CM | POA: Diagnosis not present

## 2021-08-02 DIAGNOSIS — I359 Nonrheumatic aortic valve disorder, unspecified: Secondary | ICD-10-CM | POA: Diagnosis not present

## 2021-08-02 DIAGNOSIS — I10 Essential (primary) hypertension: Secondary | ICD-10-CM | POA: Diagnosis not present

## 2021-08-02 DIAGNOSIS — R634 Abnormal weight loss: Secondary | ICD-10-CM | POA: Diagnosis not present

## 2021-08-04 DIAGNOSIS — Z85850 Personal history of malignant neoplasm of thyroid: Secondary | ICD-10-CM | POA: Diagnosis not present

## 2021-08-04 DIAGNOSIS — I10 Essential (primary) hypertension: Secondary | ICD-10-CM | POA: Diagnosis not present

## 2021-08-04 DIAGNOSIS — L89153 Pressure ulcer of sacral region, stage 3: Secondary | ICD-10-CM | POA: Diagnosis not present

## 2021-08-04 DIAGNOSIS — Z6821 Body mass index (BMI) 21.0-21.9, adult: Secondary | ICD-10-CM | POA: Diagnosis not present

## 2021-08-04 DIAGNOSIS — J449 Chronic obstructive pulmonary disease, unspecified: Secondary | ICD-10-CM | POA: Diagnosis not present

## 2021-08-04 DIAGNOSIS — J9691 Respiratory failure, unspecified with hypoxia: Secondary | ICD-10-CM | POA: Diagnosis not present

## 2021-08-04 DIAGNOSIS — I359 Nonrheumatic aortic valve disorder, unspecified: Secondary | ICD-10-CM | POA: Diagnosis not present

## 2021-08-04 DIAGNOSIS — Z9981 Dependence on supplemental oxygen: Secondary | ICD-10-CM | POA: Diagnosis not present

## 2021-08-04 DIAGNOSIS — R627 Adult failure to thrive: Secondary | ICD-10-CM | POA: Diagnosis not present

## 2021-08-04 DIAGNOSIS — F419 Anxiety disorder, unspecified: Secondary | ICD-10-CM | POA: Diagnosis not present

## 2021-08-04 DIAGNOSIS — S0191XD Laceration without foreign body of unspecified part of head, subsequent encounter: Secondary | ICD-10-CM | POA: Diagnosis not present

## 2021-08-04 DIAGNOSIS — Z872 Personal history of diseases of the skin and subcutaneous tissue: Secondary | ICD-10-CM | POA: Diagnosis not present

## 2021-08-04 DIAGNOSIS — E890 Postprocedural hypothyroidism: Secondary | ICD-10-CM | POA: Diagnosis not present

## 2021-08-04 DIAGNOSIS — W19XXXD Unspecified fall, subsequent encounter: Secondary | ICD-10-CM | POA: Diagnosis not present

## 2021-08-04 DIAGNOSIS — E43 Unspecified severe protein-calorie malnutrition: Secondary | ICD-10-CM | POA: Diagnosis not present

## 2021-08-04 DIAGNOSIS — M1990 Unspecified osteoarthritis, unspecified site: Secondary | ICD-10-CM | POA: Diagnosis not present

## 2021-08-04 DIAGNOSIS — R634 Abnormal weight loss: Secondary | ICD-10-CM | POA: Diagnosis not present

## 2021-08-06 DIAGNOSIS — J9691 Respiratory failure, unspecified with hypoxia: Secondary | ICD-10-CM | POA: Diagnosis not present

## 2021-08-06 DIAGNOSIS — I359 Nonrheumatic aortic valve disorder, unspecified: Secondary | ICD-10-CM | POA: Diagnosis not present

## 2021-08-06 DIAGNOSIS — I10 Essential (primary) hypertension: Secondary | ICD-10-CM | POA: Diagnosis not present

## 2021-08-06 DIAGNOSIS — J449 Chronic obstructive pulmonary disease, unspecified: Secondary | ICD-10-CM | POA: Diagnosis not present

## 2021-08-06 DIAGNOSIS — R634 Abnormal weight loss: Secondary | ICD-10-CM | POA: Diagnosis not present

## 2021-08-06 DIAGNOSIS — F419 Anxiety disorder, unspecified: Secondary | ICD-10-CM | POA: Diagnosis not present

## 2021-08-09 DIAGNOSIS — J449 Chronic obstructive pulmonary disease, unspecified: Secondary | ICD-10-CM | POA: Diagnosis not present

## 2021-08-09 DIAGNOSIS — I10 Essential (primary) hypertension: Secondary | ICD-10-CM | POA: Diagnosis not present

## 2021-08-09 DIAGNOSIS — R634 Abnormal weight loss: Secondary | ICD-10-CM | POA: Diagnosis not present

## 2021-08-09 DIAGNOSIS — I359 Nonrheumatic aortic valve disorder, unspecified: Secondary | ICD-10-CM | POA: Diagnosis not present

## 2021-08-09 DIAGNOSIS — J9691 Respiratory failure, unspecified with hypoxia: Secondary | ICD-10-CM | POA: Diagnosis not present

## 2021-08-09 DIAGNOSIS — F419 Anxiety disorder, unspecified: Secondary | ICD-10-CM | POA: Diagnosis not present

## 2021-08-14 DIAGNOSIS — I359 Nonrheumatic aortic valve disorder, unspecified: Secondary | ICD-10-CM | POA: Diagnosis not present

## 2021-08-14 DIAGNOSIS — J449 Chronic obstructive pulmonary disease, unspecified: Secondary | ICD-10-CM | POA: Diagnosis not present

## 2021-08-14 DIAGNOSIS — F419 Anxiety disorder, unspecified: Secondary | ICD-10-CM | POA: Diagnosis not present

## 2021-08-14 DIAGNOSIS — R634 Abnormal weight loss: Secondary | ICD-10-CM | POA: Diagnosis not present

## 2021-08-14 DIAGNOSIS — J9691 Respiratory failure, unspecified with hypoxia: Secondary | ICD-10-CM | POA: Diagnosis not present

## 2021-08-14 DIAGNOSIS — I10 Essential (primary) hypertension: Secondary | ICD-10-CM | POA: Diagnosis not present

## 2021-08-16 DIAGNOSIS — F419 Anxiety disorder, unspecified: Secondary | ICD-10-CM | POA: Diagnosis not present

## 2021-08-16 DIAGNOSIS — J449 Chronic obstructive pulmonary disease, unspecified: Secondary | ICD-10-CM | POA: Diagnosis not present

## 2021-08-16 DIAGNOSIS — I359 Nonrheumatic aortic valve disorder, unspecified: Secondary | ICD-10-CM | POA: Diagnosis not present

## 2021-08-16 DIAGNOSIS — I10 Essential (primary) hypertension: Secondary | ICD-10-CM | POA: Diagnosis not present

## 2021-08-16 DIAGNOSIS — J9691 Respiratory failure, unspecified with hypoxia: Secondary | ICD-10-CM | POA: Diagnosis not present

## 2021-08-16 DIAGNOSIS — R634 Abnormal weight loss: Secondary | ICD-10-CM | POA: Diagnosis not present

## 2021-08-18 DIAGNOSIS — J449 Chronic obstructive pulmonary disease, unspecified: Secondary | ICD-10-CM | POA: Diagnosis not present

## 2021-08-18 DIAGNOSIS — I10 Essential (primary) hypertension: Secondary | ICD-10-CM | POA: Diagnosis not present

## 2021-08-18 DIAGNOSIS — I359 Nonrheumatic aortic valve disorder, unspecified: Secondary | ICD-10-CM | POA: Diagnosis not present

## 2021-08-18 DIAGNOSIS — R634 Abnormal weight loss: Secondary | ICD-10-CM | POA: Diagnosis not present

## 2021-08-18 DIAGNOSIS — F419 Anxiety disorder, unspecified: Secondary | ICD-10-CM | POA: Diagnosis not present

## 2021-08-18 DIAGNOSIS — J9691 Respiratory failure, unspecified with hypoxia: Secondary | ICD-10-CM | POA: Diagnosis not present

## 2021-08-21 DIAGNOSIS — R634 Abnormal weight loss: Secondary | ICD-10-CM | POA: Diagnosis not present

## 2021-08-21 DIAGNOSIS — I359 Nonrheumatic aortic valve disorder, unspecified: Secondary | ICD-10-CM | POA: Diagnosis not present

## 2021-08-21 DIAGNOSIS — I10 Essential (primary) hypertension: Secondary | ICD-10-CM | POA: Diagnosis not present

## 2021-08-21 DIAGNOSIS — J9691 Respiratory failure, unspecified with hypoxia: Secondary | ICD-10-CM | POA: Diagnosis not present

## 2021-08-21 DIAGNOSIS — J449 Chronic obstructive pulmonary disease, unspecified: Secondary | ICD-10-CM | POA: Diagnosis not present

## 2021-08-21 DIAGNOSIS — F419 Anxiety disorder, unspecified: Secondary | ICD-10-CM | POA: Diagnosis not present

## 2021-08-23 DIAGNOSIS — I10 Essential (primary) hypertension: Secondary | ICD-10-CM | POA: Diagnosis not present

## 2021-08-23 DIAGNOSIS — J9691 Respiratory failure, unspecified with hypoxia: Secondary | ICD-10-CM | POA: Diagnosis not present

## 2021-08-23 DIAGNOSIS — I359 Nonrheumatic aortic valve disorder, unspecified: Secondary | ICD-10-CM | POA: Diagnosis not present

## 2021-08-23 DIAGNOSIS — F419 Anxiety disorder, unspecified: Secondary | ICD-10-CM | POA: Diagnosis not present

## 2021-08-23 DIAGNOSIS — J449 Chronic obstructive pulmonary disease, unspecified: Secondary | ICD-10-CM | POA: Diagnosis not present

## 2021-08-23 DIAGNOSIS — R634 Abnormal weight loss: Secondary | ICD-10-CM | POA: Diagnosis not present

## 2021-08-25 DIAGNOSIS — J449 Chronic obstructive pulmonary disease, unspecified: Secondary | ICD-10-CM | POA: Diagnosis not present

## 2021-08-25 DIAGNOSIS — R634 Abnormal weight loss: Secondary | ICD-10-CM | POA: Diagnosis not present

## 2021-08-25 DIAGNOSIS — I359 Nonrheumatic aortic valve disorder, unspecified: Secondary | ICD-10-CM | POA: Diagnosis not present

## 2021-08-25 DIAGNOSIS — I10 Essential (primary) hypertension: Secondary | ICD-10-CM | POA: Diagnosis not present

## 2021-08-25 DIAGNOSIS — J9691 Respiratory failure, unspecified with hypoxia: Secondary | ICD-10-CM | POA: Diagnosis not present

## 2021-08-25 DIAGNOSIS — F419 Anxiety disorder, unspecified: Secondary | ICD-10-CM | POA: Diagnosis not present

## 2021-08-26 DIAGNOSIS — F419 Anxiety disorder, unspecified: Secondary | ICD-10-CM | POA: Diagnosis not present

## 2021-08-26 DIAGNOSIS — J9691 Respiratory failure, unspecified with hypoxia: Secondary | ICD-10-CM | POA: Diagnosis not present

## 2021-08-26 DIAGNOSIS — I359 Nonrheumatic aortic valve disorder, unspecified: Secondary | ICD-10-CM | POA: Diagnosis not present

## 2021-08-26 DIAGNOSIS — I10 Essential (primary) hypertension: Secondary | ICD-10-CM | POA: Diagnosis not present

## 2021-08-26 DIAGNOSIS — R634 Abnormal weight loss: Secondary | ICD-10-CM | POA: Diagnosis not present

## 2021-08-26 DIAGNOSIS — J449 Chronic obstructive pulmonary disease, unspecified: Secondary | ICD-10-CM | POA: Diagnosis not present

## 2021-08-27 DIAGNOSIS — J9691 Respiratory failure, unspecified with hypoxia: Secondary | ICD-10-CM | POA: Diagnosis not present

## 2021-08-27 DIAGNOSIS — I10 Essential (primary) hypertension: Secondary | ICD-10-CM | POA: Diagnosis not present

## 2021-08-27 DIAGNOSIS — I359 Nonrheumatic aortic valve disorder, unspecified: Secondary | ICD-10-CM | POA: Diagnosis not present

## 2021-08-27 DIAGNOSIS — F419 Anxiety disorder, unspecified: Secondary | ICD-10-CM | POA: Diagnosis not present

## 2021-08-27 DIAGNOSIS — J449 Chronic obstructive pulmonary disease, unspecified: Secondary | ICD-10-CM | POA: Diagnosis not present

## 2021-08-27 DIAGNOSIS — R634 Abnormal weight loss: Secondary | ICD-10-CM | POA: Diagnosis not present

## 2021-08-28 DIAGNOSIS — F419 Anxiety disorder, unspecified: Secondary | ICD-10-CM | POA: Diagnosis not present

## 2021-08-28 DIAGNOSIS — I10 Essential (primary) hypertension: Secondary | ICD-10-CM | POA: Diagnosis not present

## 2021-08-28 DIAGNOSIS — J449 Chronic obstructive pulmonary disease, unspecified: Secondary | ICD-10-CM | POA: Diagnosis not present

## 2021-08-28 DIAGNOSIS — I359 Nonrheumatic aortic valve disorder, unspecified: Secondary | ICD-10-CM | POA: Diagnosis not present

## 2021-08-28 DIAGNOSIS — J9691 Respiratory failure, unspecified with hypoxia: Secondary | ICD-10-CM | POA: Diagnosis not present

## 2021-08-28 DIAGNOSIS — R634 Abnormal weight loss: Secondary | ICD-10-CM | POA: Diagnosis not present

## 2021-08-30 DIAGNOSIS — I359 Nonrheumatic aortic valve disorder, unspecified: Secondary | ICD-10-CM | POA: Diagnosis not present

## 2021-08-30 DIAGNOSIS — F419 Anxiety disorder, unspecified: Secondary | ICD-10-CM | POA: Diagnosis not present

## 2021-08-30 DIAGNOSIS — I10 Essential (primary) hypertension: Secondary | ICD-10-CM | POA: Diagnosis not present

## 2021-08-30 DIAGNOSIS — J9691 Respiratory failure, unspecified with hypoxia: Secondary | ICD-10-CM | POA: Diagnosis not present

## 2021-08-30 DIAGNOSIS — R634 Abnormal weight loss: Secondary | ICD-10-CM | POA: Diagnosis not present

## 2021-08-30 DIAGNOSIS — J449 Chronic obstructive pulmonary disease, unspecified: Secondary | ICD-10-CM | POA: Diagnosis not present

## 2021-09-03 DIAGNOSIS — I359 Nonrheumatic aortic valve disorder, unspecified: Secondary | ICD-10-CM | POA: Diagnosis not present

## 2021-09-03 DIAGNOSIS — J9691 Respiratory failure, unspecified with hypoxia: Secondary | ICD-10-CM | POA: Diagnosis not present

## 2021-09-03 DIAGNOSIS — R634 Abnormal weight loss: Secondary | ICD-10-CM | POA: Diagnosis not present

## 2021-09-03 DIAGNOSIS — J449 Chronic obstructive pulmonary disease, unspecified: Secondary | ICD-10-CM | POA: Diagnosis not present

## 2021-09-03 DIAGNOSIS — F419 Anxiety disorder, unspecified: Secondary | ICD-10-CM | POA: Diagnosis not present

## 2021-09-03 DIAGNOSIS — I10 Essential (primary) hypertension: Secondary | ICD-10-CM | POA: Diagnosis not present

## 2021-09-04 DIAGNOSIS — Z741 Need for assistance with personal care: Secondary | ICD-10-CM | POA: Diagnosis not present

## 2021-09-04 DIAGNOSIS — Z9981 Dependence on supplemental oxygen: Secondary | ICD-10-CM | POA: Diagnosis not present

## 2021-09-04 DIAGNOSIS — E43 Unspecified severe protein-calorie malnutrition: Secondary | ICD-10-CM | POA: Diagnosis not present

## 2021-09-04 DIAGNOSIS — R296 Repeated falls: Secondary | ICD-10-CM | POA: Diagnosis not present

## 2021-09-04 DIAGNOSIS — I10 Essential (primary) hypertension: Secondary | ICD-10-CM | POA: Diagnosis not present

## 2021-09-04 DIAGNOSIS — Z6821 Body mass index (BMI) 21.0-21.9, adult: Secondary | ICD-10-CM | POA: Diagnosis not present

## 2021-09-04 DIAGNOSIS — R159 Full incontinence of feces: Secondary | ICD-10-CM | POA: Diagnosis not present

## 2021-09-04 DIAGNOSIS — E890 Postprocedural hypothyroidism: Secondary | ICD-10-CM | POA: Diagnosis not present

## 2021-09-04 DIAGNOSIS — I359 Nonrheumatic aortic valve disorder, unspecified: Secondary | ICD-10-CM | POA: Diagnosis not present

## 2021-09-04 DIAGNOSIS — R627 Adult failure to thrive: Secondary | ICD-10-CM | POA: Diagnosis not present

## 2021-09-04 DIAGNOSIS — R634 Abnormal weight loss: Secondary | ICD-10-CM | POA: Diagnosis not present

## 2021-09-04 DIAGNOSIS — M1990 Unspecified osteoarthritis, unspecified site: Secondary | ICD-10-CM | POA: Diagnosis not present

## 2021-09-04 DIAGNOSIS — F419 Anxiety disorder, unspecified: Secondary | ICD-10-CM | POA: Diagnosis not present

## 2021-09-04 DIAGNOSIS — Z85850 Personal history of malignant neoplasm of thyroid: Secondary | ICD-10-CM | POA: Diagnosis not present

## 2021-09-04 DIAGNOSIS — L89312 Pressure ulcer of right buttock, stage 2: Secondary | ICD-10-CM | POA: Diagnosis not present

## 2021-09-04 DIAGNOSIS — L89322 Pressure ulcer of left buttock, stage 2: Secondary | ICD-10-CM | POA: Diagnosis not present

## 2021-09-04 DIAGNOSIS — L89153 Pressure ulcer of sacral region, stage 3: Secondary | ICD-10-CM | POA: Diagnosis not present

## 2021-09-04 DIAGNOSIS — J449 Chronic obstructive pulmonary disease, unspecified: Secondary | ICD-10-CM | POA: Diagnosis not present

## 2021-09-04 DIAGNOSIS — R32 Unspecified urinary incontinence: Secondary | ICD-10-CM | POA: Diagnosis not present

## 2021-09-04 DIAGNOSIS — Z872 Personal history of diseases of the skin and subcutaneous tissue: Secondary | ICD-10-CM | POA: Diagnosis not present

## 2021-09-04 DIAGNOSIS — J9691 Respiratory failure, unspecified with hypoxia: Secondary | ICD-10-CM | POA: Diagnosis not present

## 2021-09-06 DIAGNOSIS — R634 Abnormal weight loss: Secondary | ICD-10-CM | POA: Diagnosis not present

## 2021-09-06 DIAGNOSIS — I359 Nonrheumatic aortic valve disorder, unspecified: Secondary | ICD-10-CM | POA: Diagnosis not present

## 2021-09-06 DIAGNOSIS — J9691 Respiratory failure, unspecified with hypoxia: Secondary | ICD-10-CM | POA: Diagnosis not present

## 2021-09-06 DIAGNOSIS — I10 Essential (primary) hypertension: Secondary | ICD-10-CM | POA: Diagnosis not present

## 2021-09-06 DIAGNOSIS — F419 Anxiety disorder, unspecified: Secondary | ICD-10-CM | POA: Diagnosis not present

## 2021-09-06 DIAGNOSIS — J449 Chronic obstructive pulmonary disease, unspecified: Secondary | ICD-10-CM | POA: Diagnosis not present

## 2021-09-07 DIAGNOSIS — F419 Anxiety disorder, unspecified: Secondary | ICD-10-CM | POA: Diagnosis not present

## 2021-09-07 DIAGNOSIS — J9691 Respiratory failure, unspecified with hypoxia: Secondary | ICD-10-CM | POA: Diagnosis not present

## 2021-09-07 DIAGNOSIS — I359 Nonrheumatic aortic valve disorder, unspecified: Secondary | ICD-10-CM | POA: Diagnosis not present

## 2021-09-07 DIAGNOSIS — J449 Chronic obstructive pulmonary disease, unspecified: Secondary | ICD-10-CM | POA: Diagnosis not present

## 2021-09-07 DIAGNOSIS — I10 Essential (primary) hypertension: Secondary | ICD-10-CM | POA: Diagnosis not present

## 2021-09-07 DIAGNOSIS — R634 Abnormal weight loss: Secondary | ICD-10-CM | POA: Diagnosis not present

## 2021-09-09 DIAGNOSIS — J449 Chronic obstructive pulmonary disease, unspecified: Secondary | ICD-10-CM | POA: Diagnosis not present

## 2021-09-09 DIAGNOSIS — F419 Anxiety disorder, unspecified: Secondary | ICD-10-CM | POA: Diagnosis not present

## 2021-09-09 DIAGNOSIS — J9691 Respiratory failure, unspecified with hypoxia: Secondary | ICD-10-CM | POA: Diagnosis not present

## 2021-09-09 DIAGNOSIS — I359 Nonrheumatic aortic valve disorder, unspecified: Secondary | ICD-10-CM | POA: Diagnosis not present

## 2021-09-09 DIAGNOSIS — I10 Essential (primary) hypertension: Secondary | ICD-10-CM | POA: Diagnosis not present

## 2021-09-09 DIAGNOSIS — R634 Abnormal weight loss: Secondary | ICD-10-CM | POA: Diagnosis not present

## 2021-09-11 DIAGNOSIS — J9691 Respiratory failure, unspecified with hypoxia: Secondary | ICD-10-CM | POA: Diagnosis not present

## 2021-09-11 DIAGNOSIS — R634 Abnormal weight loss: Secondary | ICD-10-CM | POA: Diagnosis not present

## 2021-09-11 DIAGNOSIS — J449 Chronic obstructive pulmonary disease, unspecified: Secondary | ICD-10-CM | POA: Diagnosis not present

## 2021-09-11 DIAGNOSIS — I359 Nonrheumatic aortic valve disorder, unspecified: Secondary | ICD-10-CM | POA: Diagnosis not present

## 2021-09-11 DIAGNOSIS — F419 Anxiety disorder, unspecified: Secondary | ICD-10-CM | POA: Diagnosis not present

## 2021-09-11 DIAGNOSIS — I10 Essential (primary) hypertension: Secondary | ICD-10-CM | POA: Diagnosis not present

## 2021-09-12 ENCOUNTER — Ambulatory Visit: Payer: Medicare Other | Admitting: Family Medicine

## 2021-09-12 DIAGNOSIS — J449 Chronic obstructive pulmonary disease, unspecified: Secondary | ICD-10-CM | POA: Diagnosis not present

## 2021-09-12 DIAGNOSIS — I10 Essential (primary) hypertension: Secondary | ICD-10-CM | POA: Diagnosis not present

## 2021-09-12 DIAGNOSIS — I359 Nonrheumatic aortic valve disorder, unspecified: Secondary | ICD-10-CM | POA: Diagnosis not present

## 2021-09-12 DIAGNOSIS — F419 Anxiety disorder, unspecified: Secondary | ICD-10-CM | POA: Diagnosis not present

## 2021-09-12 DIAGNOSIS — J9691 Respiratory failure, unspecified with hypoxia: Secondary | ICD-10-CM | POA: Diagnosis not present

## 2021-09-12 DIAGNOSIS — R634 Abnormal weight loss: Secondary | ICD-10-CM | POA: Diagnosis not present

## 2021-09-13 DIAGNOSIS — J449 Chronic obstructive pulmonary disease, unspecified: Secondary | ICD-10-CM | POA: Diagnosis not present

## 2021-09-13 DIAGNOSIS — R634 Abnormal weight loss: Secondary | ICD-10-CM | POA: Diagnosis not present

## 2021-09-13 DIAGNOSIS — I359 Nonrheumatic aortic valve disorder, unspecified: Secondary | ICD-10-CM | POA: Diagnosis not present

## 2021-09-13 DIAGNOSIS — I10 Essential (primary) hypertension: Secondary | ICD-10-CM | POA: Diagnosis not present

## 2021-09-13 DIAGNOSIS — F419 Anxiety disorder, unspecified: Secondary | ICD-10-CM | POA: Diagnosis not present

## 2021-09-13 DIAGNOSIS — J9691 Respiratory failure, unspecified with hypoxia: Secondary | ICD-10-CM | POA: Diagnosis not present

## 2021-09-17 DIAGNOSIS — J449 Chronic obstructive pulmonary disease, unspecified: Secondary | ICD-10-CM | POA: Diagnosis not present

## 2021-09-17 DIAGNOSIS — J9691 Respiratory failure, unspecified with hypoxia: Secondary | ICD-10-CM | POA: Diagnosis not present

## 2021-09-17 DIAGNOSIS — F419 Anxiety disorder, unspecified: Secondary | ICD-10-CM | POA: Diagnosis not present

## 2021-09-17 DIAGNOSIS — I10 Essential (primary) hypertension: Secondary | ICD-10-CM | POA: Diagnosis not present

## 2021-09-17 DIAGNOSIS — I359 Nonrheumatic aortic valve disorder, unspecified: Secondary | ICD-10-CM | POA: Diagnosis not present

## 2021-09-17 DIAGNOSIS — R634 Abnormal weight loss: Secondary | ICD-10-CM | POA: Diagnosis not present

## 2021-09-20 DIAGNOSIS — R634 Abnormal weight loss: Secondary | ICD-10-CM | POA: Diagnosis not present

## 2021-09-20 DIAGNOSIS — J9691 Respiratory failure, unspecified with hypoxia: Secondary | ICD-10-CM | POA: Diagnosis not present

## 2021-09-20 DIAGNOSIS — J449 Chronic obstructive pulmonary disease, unspecified: Secondary | ICD-10-CM | POA: Diagnosis not present

## 2021-09-20 DIAGNOSIS — F419 Anxiety disorder, unspecified: Secondary | ICD-10-CM | POA: Diagnosis not present

## 2021-09-20 DIAGNOSIS — I10 Essential (primary) hypertension: Secondary | ICD-10-CM | POA: Diagnosis not present

## 2021-09-20 DIAGNOSIS — I359 Nonrheumatic aortic valve disorder, unspecified: Secondary | ICD-10-CM | POA: Diagnosis not present

## 2021-09-22 DIAGNOSIS — R634 Abnormal weight loss: Secondary | ICD-10-CM | POA: Diagnosis not present

## 2021-09-22 DIAGNOSIS — I10 Essential (primary) hypertension: Secondary | ICD-10-CM | POA: Diagnosis not present

## 2021-09-22 DIAGNOSIS — I359 Nonrheumatic aortic valve disorder, unspecified: Secondary | ICD-10-CM | POA: Diagnosis not present

## 2021-09-22 DIAGNOSIS — F419 Anxiety disorder, unspecified: Secondary | ICD-10-CM | POA: Diagnosis not present

## 2021-09-22 DIAGNOSIS — J9691 Respiratory failure, unspecified with hypoxia: Secondary | ICD-10-CM | POA: Diagnosis not present

## 2021-09-22 DIAGNOSIS — J449 Chronic obstructive pulmonary disease, unspecified: Secondary | ICD-10-CM | POA: Diagnosis not present

## 2021-09-24 DIAGNOSIS — F419 Anxiety disorder, unspecified: Secondary | ICD-10-CM | POA: Diagnosis not present

## 2021-09-24 DIAGNOSIS — R634 Abnormal weight loss: Secondary | ICD-10-CM | POA: Diagnosis not present

## 2021-09-24 DIAGNOSIS — I10 Essential (primary) hypertension: Secondary | ICD-10-CM | POA: Diagnosis not present

## 2021-09-24 DIAGNOSIS — J449 Chronic obstructive pulmonary disease, unspecified: Secondary | ICD-10-CM | POA: Diagnosis not present

## 2021-09-24 DIAGNOSIS — J9691 Respiratory failure, unspecified with hypoxia: Secondary | ICD-10-CM | POA: Diagnosis not present

## 2021-09-24 DIAGNOSIS — I359 Nonrheumatic aortic valve disorder, unspecified: Secondary | ICD-10-CM | POA: Diagnosis not present

## 2021-09-25 DIAGNOSIS — I10 Essential (primary) hypertension: Secondary | ICD-10-CM | POA: Diagnosis not present

## 2021-09-25 DIAGNOSIS — F419 Anxiety disorder, unspecified: Secondary | ICD-10-CM | POA: Diagnosis not present

## 2021-09-25 DIAGNOSIS — J449 Chronic obstructive pulmonary disease, unspecified: Secondary | ICD-10-CM | POA: Diagnosis not present

## 2021-09-25 DIAGNOSIS — R634 Abnormal weight loss: Secondary | ICD-10-CM | POA: Diagnosis not present

## 2021-09-25 DIAGNOSIS — J9691 Respiratory failure, unspecified with hypoxia: Secondary | ICD-10-CM | POA: Diagnosis not present

## 2021-09-25 DIAGNOSIS — I359 Nonrheumatic aortic valve disorder, unspecified: Secondary | ICD-10-CM | POA: Diagnosis not present

## 2021-09-28 ENCOUNTER — Other Ambulatory Visit: Payer: Self-pay | Admitting: Family Medicine

## 2021-09-28 DIAGNOSIS — F419 Anxiety disorder, unspecified: Secondary | ICD-10-CM | POA: Diagnosis not present

## 2021-09-28 DIAGNOSIS — I359 Nonrheumatic aortic valve disorder, unspecified: Secondary | ICD-10-CM | POA: Diagnosis not present

## 2021-09-28 DIAGNOSIS — J9691 Respiratory failure, unspecified with hypoxia: Secondary | ICD-10-CM | POA: Diagnosis not present

## 2021-09-28 DIAGNOSIS — F411 Generalized anxiety disorder: Secondary | ICD-10-CM

## 2021-09-28 DIAGNOSIS — I10 Essential (primary) hypertension: Secondary | ICD-10-CM | POA: Diagnosis not present

## 2021-09-28 DIAGNOSIS — J449 Chronic obstructive pulmonary disease, unspecified: Secondary | ICD-10-CM | POA: Diagnosis not present

## 2021-09-28 DIAGNOSIS — R634 Abnormal weight loss: Secondary | ICD-10-CM | POA: Diagnosis not present

## 2021-09-29 NOTE — Telephone Encounter (Signed)
Requested medication (s) are due for refill today: yes  Requested medication (s) are on the active medication list: yes  Last refill:  04/03/21 #60 5 RF  Future visit scheduled: no  Notes to clinic:  med not delegated to NT to RF   Requested Prescriptions  Pending Prescriptions Disp Refills   LORazepam (ATIVAN) 0.5 MG tablet [Pharmacy Med Name: LORAZEPAM 0.5MG  TABLETS] 60 tablet     Sig: TAKE 1/2 TABLET(0.25 MG) BY MOUTH TWICE DAILY AS NEEDED FOR ANXIETY     Not Delegated - Psychiatry:  Anxiolytics/Hypnotics Failed - 09/28/2021  1:35 PM      Failed - This refill cannot be delegated      Failed - Urine Drug Screen completed in last 360 days      Passed - Valid encounter within last 6 months    Recent Outpatient Visits           4 months ago Essential hypertension   [facility] Jerrol Banana., MD   8 months ago Essential hypertension   [facility] Jerrol Banana., MD   1 year ago Bronchiectasis without acute exacerbation [facility])   [facility] Jerrol Banana., MD   1 year ago Generalized anxiety disorder   [facility] Jerrol Banana., MD   1 year ago Generalized anxiety disorder   [facility] Jerrol Banana., MD

## 2021-09-30 DIAGNOSIS — F419 Anxiety disorder, unspecified: Secondary | ICD-10-CM | POA: Diagnosis not present

## 2021-09-30 DIAGNOSIS — J449 Chronic obstructive pulmonary disease, unspecified: Secondary | ICD-10-CM | POA: Diagnosis not present

## 2021-09-30 DIAGNOSIS — J9691 Respiratory failure, unspecified with hypoxia: Secondary | ICD-10-CM | POA: Diagnosis not present

## 2021-09-30 DIAGNOSIS — I10 Essential (primary) hypertension: Secondary | ICD-10-CM | POA: Diagnosis not present

## 2021-09-30 DIAGNOSIS — I359 Nonrheumatic aortic valve disorder, unspecified: Secondary | ICD-10-CM | POA: Diagnosis not present

## 2021-09-30 DIAGNOSIS — R634 Abnormal weight loss: Secondary | ICD-10-CM | POA: Diagnosis not present

## 2021-10-02 DIAGNOSIS — J449 Chronic obstructive pulmonary disease, unspecified: Secondary | ICD-10-CM | POA: Diagnosis not present

## 2021-10-02 DIAGNOSIS — J9691 Respiratory failure, unspecified with hypoxia: Secondary | ICD-10-CM | POA: Diagnosis not present

## 2021-10-02 DIAGNOSIS — F419 Anxiety disorder, unspecified: Secondary | ICD-10-CM | POA: Diagnosis not present

## 2021-10-02 DIAGNOSIS — I359 Nonrheumatic aortic valve disorder, unspecified: Secondary | ICD-10-CM | POA: Diagnosis not present

## 2021-10-02 DIAGNOSIS — R634 Abnormal weight loss: Secondary | ICD-10-CM | POA: Diagnosis not present

## 2021-10-02 DIAGNOSIS — I10 Essential (primary) hypertension: Secondary | ICD-10-CM | POA: Diagnosis not present

## 2021-10-03 DIAGNOSIS — J9691 Respiratory failure, unspecified with hypoxia: Secondary | ICD-10-CM | POA: Diagnosis not present

## 2021-10-03 DIAGNOSIS — F419 Anxiety disorder, unspecified: Secondary | ICD-10-CM | POA: Diagnosis not present

## 2021-10-03 DIAGNOSIS — I10 Essential (primary) hypertension: Secondary | ICD-10-CM | POA: Diagnosis not present

## 2021-10-03 DIAGNOSIS — J449 Chronic obstructive pulmonary disease, unspecified: Secondary | ICD-10-CM | POA: Diagnosis not present

## 2021-10-03 DIAGNOSIS — R634 Abnormal weight loss: Secondary | ICD-10-CM | POA: Diagnosis not present

## 2021-10-03 DIAGNOSIS — I359 Nonrheumatic aortic valve disorder, unspecified: Secondary | ICD-10-CM | POA: Diagnosis not present

## 2021-10-04 DIAGNOSIS — R627 Adult failure to thrive: Secondary | ICD-10-CM | POA: Diagnosis not present

## 2021-10-04 DIAGNOSIS — Z9981 Dependence on supplemental oxygen: Secondary | ICD-10-CM | POA: Diagnosis not present

## 2021-10-04 DIAGNOSIS — R32 Unspecified urinary incontinence: Secondary | ICD-10-CM | POA: Diagnosis not present

## 2021-10-04 DIAGNOSIS — Z6821 Body mass index (BMI) 21.0-21.9, adult: Secondary | ICD-10-CM | POA: Diagnosis not present

## 2021-10-04 DIAGNOSIS — L89322 Pressure ulcer of left buttock, stage 2: Secondary | ICD-10-CM | POA: Diagnosis not present

## 2021-10-04 DIAGNOSIS — Z741 Need for assistance with personal care: Secondary | ICD-10-CM | POA: Diagnosis not present

## 2021-10-04 DIAGNOSIS — R296 Repeated falls: Secondary | ICD-10-CM | POA: Diagnosis not present

## 2021-10-04 DIAGNOSIS — L89312 Pressure ulcer of right buttock, stage 2: Secondary | ICD-10-CM | POA: Diagnosis not present

## 2021-10-04 DIAGNOSIS — L89153 Pressure ulcer of sacral region, stage 3: Secondary | ICD-10-CM | POA: Diagnosis not present

## 2021-10-04 DIAGNOSIS — J449 Chronic obstructive pulmonary disease, unspecified: Secondary | ICD-10-CM | POA: Diagnosis not present

## 2021-10-04 DIAGNOSIS — Z85850 Personal history of malignant neoplasm of thyroid: Secondary | ICD-10-CM | POA: Diagnosis not present

## 2021-10-04 DIAGNOSIS — I359 Nonrheumatic aortic valve disorder, unspecified: Secondary | ICD-10-CM | POA: Diagnosis not present

## 2021-10-04 DIAGNOSIS — M1990 Unspecified osteoarthritis, unspecified site: Secondary | ICD-10-CM | POA: Diagnosis not present

## 2021-10-04 DIAGNOSIS — E890 Postprocedural hypothyroidism: Secondary | ICD-10-CM | POA: Diagnosis not present

## 2021-10-04 DIAGNOSIS — J9691 Respiratory failure, unspecified with hypoxia: Secondary | ICD-10-CM | POA: Diagnosis not present

## 2021-10-04 DIAGNOSIS — R634 Abnormal weight loss: Secondary | ICD-10-CM | POA: Diagnosis not present

## 2021-10-04 DIAGNOSIS — R159 Full incontinence of feces: Secondary | ICD-10-CM | POA: Diagnosis not present

## 2021-10-04 DIAGNOSIS — Z872 Personal history of diseases of the skin and subcutaneous tissue: Secondary | ICD-10-CM | POA: Diagnosis not present

## 2021-10-04 DIAGNOSIS — F419 Anxiety disorder, unspecified: Secondary | ICD-10-CM | POA: Diagnosis not present

## 2021-10-04 DIAGNOSIS — E43 Unspecified severe protein-calorie malnutrition: Secondary | ICD-10-CM | POA: Diagnosis not present

## 2021-10-04 DIAGNOSIS — I10 Essential (primary) hypertension: Secondary | ICD-10-CM | POA: Diagnosis not present

## 2021-10-06 DIAGNOSIS — I10 Essential (primary) hypertension: Secondary | ICD-10-CM | POA: Diagnosis not present

## 2021-10-06 DIAGNOSIS — J449 Chronic obstructive pulmonary disease, unspecified: Secondary | ICD-10-CM | POA: Diagnosis not present

## 2021-10-06 DIAGNOSIS — I359 Nonrheumatic aortic valve disorder, unspecified: Secondary | ICD-10-CM | POA: Diagnosis not present

## 2021-10-06 DIAGNOSIS — J9691 Respiratory failure, unspecified with hypoxia: Secondary | ICD-10-CM | POA: Diagnosis not present

## 2021-10-06 DIAGNOSIS — F419 Anxiety disorder, unspecified: Secondary | ICD-10-CM | POA: Diagnosis not present

## 2021-10-06 DIAGNOSIS — R634 Abnormal weight loss: Secondary | ICD-10-CM | POA: Diagnosis not present

## 2021-10-10 DIAGNOSIS — R634 Abnormal weight loss: Secondary | ICD-10-CM | POA: Diagnosis not present

## 2021-10-10 DIAGNOSIS — J449 Chronic obstructive pulmonary disease, unspecified: Secondary | ICD-10-CM | POA: Diagnosis not present

## 2021-10-10 DIAGNOSIS — I10 Essential (primary) hypertension: Secondary | ICD-10-CM | POA: Diagnosis not present

## 2021-10-10 DIAGNOSIS — J9691 Respiratory failure, unspecified with hypoxia: Secondary | ICD-10-CM | POA: Diagnosis not present

## 2021-10-10 DIAGNOSIS — F419 Anxiety disorder, unspecified: Secondary | ICD-10-CM | POA: Diagnosis not present

## 2021-10-10 DIAGNOSIS — I359 Nonrheumatic aortic valve disorder, unspecified: Secondary | ICD-10-CM | POA: Diagnosis not present

## 2021-10-11 DIAGNOSIS — I359 Nonrheumatic aortic valve disorder, unspecified: Secondary | ICD-10-CM | POA: Diagnosis not present

## 2021-10-11 DIAGNOSIS — F419 Anxiety disorder, unspecified: Secondary | ICD-10-CM | POA: Diagnosis not present

## 2021-10-11 DIAGNOSIS — J449 Chronic obstructive pulmonary disease, unspecified: Secondary | ICD-10-CM | POA: Diagnosis not present

## 2021-10-11 DIAGNOSIS — R634 Abnormal weight loss: Secondary | ICD-10-CM | POA: Diagnosis not present

## 2021-10-11 DIAGNOSIS — J9691 Respiratory failure, unspecified with hypoxia: Secondary | ICD-10-CM | POA: Diagnosis not present

## 2021-10-11 DIAGNOSIS — I10 Essential (primary) hypertension: Secondary | ICD-10-CM | POA: Diagnosis not present

## 2021-10-13 DIAGNOSIS — F419 Anxiety disorder, unspecified: Secondary | ICD-10-CM | POA: Diagnosis not present

## 2021-10-13 DIAGNOSIS — J449 Chronic obstructive pulmonary disease, unspecified: Secondary | ICD-10-CM | POA: Diagnosis not present

## 2021-10-13 DIAGNOSIS — I10 Essential (primary) hypertension: Secondary | ICD-10-CM | POA: Diagnosis not present

## 2021-10-13 DIAGNOSIS — J9691 Respiratory failure, unspecified with hypoxia: Secondary | ICD-10-CM | POA: Diagnosis not present

## 2021-10-13 DIAGNOSIS — I359 Nonrheumatic aortic valve disorder, unspecified: Secondary | ICD-10-CM | POA: Diagnosis not present

## 2021-10-13 DIAGNOSIS — R634 Abnormal weight loss: Secondary | ICD-10-CM | POA: Diagnosis not present

## 2021-10-16 DIAGNOSIS — I10 Essential (primary) hypertension: Secondary | ICD-10-CM | POA: Diagnosis not present

## 2021-10-16 DIAGNOSIS — J9691 Respiratory failure, unspecified with hypoxia: Secondary | ICD-10-CM | POA: Diagnosis not present

## 2021-10-16 DIAGNOSIS — R634 Abnormal weight loss: Secondary | ICD-10-CM | POA: Diagnosis not present

## 2021-10-16 DIAGNOSIS — I359 Nonrheumatic aortic valve disorder, unspecified: Secondary | ICD-10-CM | POA: Diagnosis not present

## 2021-10-16 DIAGNOSIS — F419 Anxiety disorder, unspecified: Secondary | ICD-10-CM | POA: Diagnosis not present

## 2021-10-16 DIAGNOSIS — J449 Chronic obstructive pulmonary disease, unspecified: Secondary | ICD-10-CM | POA: Diagnosis not present

## 2021-10-18 DIAGNOSIS — J9691 Respiratory failure, unspecified with hypoxia: Secondary | ICD-10-CM | POA: Diagnosis not present

## 2021-10-18 DIAGNOSIS — I359 Nonrheumatic aortic valve disorder, unspecified: Secondary | ICD-10-CM | POA: Diagnosis not present

## 2021-10-18 DIAGNOSIS — I10 Essential (primary) hypertension: Secondary | ICD-10-CM | POA: Diagnosis not present

## 2021-10-18 DIAGNOSIS — F419 Anxiety disorder, unspecified: Secondary | ICD-10-CM | POA: Diagnosis not present

## 2021-10-18 DIAGNOSIS — R634 Abnormal weight loss: Secondary | ICD-10-CM | POA: Diagnosis not present

## 2021-10-18 DIAGNOSIS — J449 Chronic obstructive pulmonary disease, unspecified: Secondary | ICD-10-CM | POA: Diagnosis not present

## 2021-10-27 DIAGNOSIS — I10 Essential (primary) hypertension: Secondary | ICD-10-CM | POA: Diagnosis not present

## 2021-10-27 DIAGNOSIS — I359 Nonrheumatic aortic valve disorder, unspecified: Secondary | ICD-10-CM | POA: Diagnosis not present

## 2021-10-27 DIAGNOSIS — R634 Abnormal weight loss: Secondary | ICD-10-CM | POA: Diagnosis not present

## 2021-10-27 DIAGNOSIS — J449 Chronic obstructive pulmonary disease, unspecified: Secondary | ICD-10-CM | POA: Diagnosis not present

## 2021-10-27 DIAGNOSIS — F419 Anxiety disorder, unspecified: Secondary | ICD-10-CM | POA: Diagnosis not present

## 2021-10-27 DIAGNOSIS — J9691 Respiratory failure, unspecified with hypoxia: Secondary | ICD-10-CM | POA: Diagnosis not present

## 2021-10-30 DIAGNOSIS — I359 Nonrheumatic aortic valve disorder, unspecified: Secondary | ICD-10-CM | POA: Diagnosis not present

## 2021-10-30 DIAGNOSIS — I10 Essential (primary) hypertension: Secondary | ICD-10-CM | POA: Diagnosis not present

## 2021-10-30 DIAGNOSIS — J449 Chronic obstructive pulmonary disease, unspecified: Secondary | ICD-10-CM | POA: Diagnosis not present

## 2021-10-30 DIAGNOSIS — F419 Anxiety disorder, unspecified: Secondary | ICD-10-CM | POA: Diagnosis not present

## 2021-10-30 DIAGNOSIS — R634 Abnormal weight loss: Secondary | ICD-10-CM | POA: Diagnosis not present

## 2021-10-30 DIAGNOSIS — J9691 Respiratory failure, unspecified with hypoxia: Secondary | ICD-10-CM | POA: Diagnosis not present

## 2021-11-01 DIAGNOSIS — I10 Essential (primary) hypertension: Secondary | ICD-10-CM | POA: Diagnosis not present

## 2021-11-01 DIAGNOSIS — J9691 Respiratory failure, unspecified with hypoxia: Secondary | ICD-10-CM | POA: Diagnosis not present

## 2021-11-01 DIAGNOSIS — I359 Nonrheumatic aortic valve disorder, unspecified: Secondary | ICD-10-CM | POA: Diagnosis not present

## 2021-11-01 DIAGNOSIS — R634 Abnormal weight loss: Secondary | ICD-10-CM | POA: Diagnosis not present

## 2021-11-01 DIAGNOSIS — F419 Anxiety disorder, unspecified: Secondary | ICD-10-CM | POA: Diagnosis not present

## 2021-11-01 DIAGNOSIS — J449 Chronic obstructive pulmonary disease, unspecified: Secondary | ICD-10-CM | POA: Diagnosis not present

## 2021-11-04 DIAGNOSIS — Z741 Need for assistance with personal care: Secondary | ICD-10-CM | POA: Diagnosis not present

## 2021-11-04 DIAGNOSIS — I359 Nonrheumatic aortic valve disorder, unspecified: Secondary | ICD-10-CM | POA: Diagnosis not present

## 2021-11-04 DIAGNOSIS — R296 Repeated falls: Secondary | ICD-10-CM | POA: Diagnosis not present

## 2021-11-04 DIAGNOSIS — I10 Essential (primary) hypertension: Secondary | ICD-10-CM | POA: Diagnosis not present

## 2021-11-04 DIAGNOSIS — Z85850 Personal history of malignant neoplasm of thyroid: Secondary | ICD-10-CM | POA: Diagnosis not present

## 2021-11-04 DIAGNOSIS — Z872 Personal history of diseases of the skin and subcutaneous tissue: Secondary | ICD-10-CM | POA: Diagnosis not present

## 2021-11-04 DIAGNOSIS — R159 Full incontinence of feces: Secondary | ICD-10-CM | POA: Diagnosis not present

## 2021-11-04 DIAGNOSIS — E890 Postprocedural hypothyroidism: Secondary | ICD-10-CM | POA: Diagnosis not present

## 2021-11-04 DIAGNOSIS — L89322 Pressure ulcer of left buttock, stage 2: Secondary | ICD-10-CM | POA: Diagnosis not present

## 2021-11-04 DIAGNOSIS — E43 Unspecified severe protein-calorie malnutrition: Secondary | ICD-10-CM | POA: Diagnosis not present

## 2021-11-04 DIAGNOSIS — J449 Chronic obstructive pulmonary disease, unspecified: Secondary | ICD-10-CM | POA: Diagnosis not present

## 2021-11-04 DIAGNOSIS — R634 Abnormal weight loss: Secondary | ICD-10-CM | POA: Diagnosis not present

## 2021-11-04 DIAGNOSIS — R32 Unspecified urinary incontinence: Secondary | ICD-10-CM | POA: Diagnosis not present

## 2021-11-04 DIAGNOSIS — Z6821 Body mass index (BMI) 21.0-21.9, adult: Secondary | ICD-10-CM | POA: Diagnosis not present

## 2021-11-04 DIAGNOSIS — M1990 Unspecified osteoarthritis, unspecified site: Secondary | ICD-10-CM | POA: Diagnosis not present

## 2021-11-04 DIAGNOSIS — L89312 Pressure ulcer of right buttock, stage 2: Secondary | ICD-10-CM | POA: Diagnosis not present

## 2021-11-04 DIAGNOSIS — F419 Anxiety disorder, unspecified: Secondary | ICD-10-CM | POA: Diagnosis not present

## 2021-11-04 DIAGNOSIS — L89153 Pressure ulcer of sacral region, stage 3: Secondary | ICD-10-CM | POA: Diagnosis not present

## 2021-11-04 DIAGNOSIS — Z9981 Dependence on supplemental oxygen: Secondary | ICD-10-CM | POA: Diagnosis not present

## 2021-11-04 DIAGNOSIS — R627 Adult failure to thrive: Secondary | ICD-10-CM | POA: Diagnosis not present

## 2021-11-04 DIAGNOSIS — J9691 Respiratory failure, unspecified with hypoxia: Secondary | ICD-10-CM | POA: Diagnosis not present

## 2021-11-05 DIAGNOSIS — F419 Anxiety disorder, unspecified: Secondary | ICD-10-CM | POA: Diagnosis not present

## 2021-11-05 DIAGNOSIS — I359 Nonrheumatic aortic valve disorder, unspecified: Secondary | ICD-10-CM | POA: Diagnosis not present

## 2021-11-05 DIAGNOSIS — J449 Chronic obstructive pulmonary disease, unspecified: Secondary | ICD-10-CM | POA: Diagnosis not present

## 2021-11-05 DIAGNOSIS — R634 Abnormal weight loss: Secondary | ICD-10-CM | POA: Diagnosis not present

## 2021-11-05 DIAGNOSIS — I10 Essential (primary) hypertension: Secondary | ICD-10-CM | POA: Diagnosis not present

## 2021-11-05 DIAGNOSIS — J9691 Respiratory failure, unspecified with hypoxia: Secondary | ICD-10-CM | POA: Diagnosis not present

## 2021-11-06 DIAGNOSIS — J449 Chronic obstructive pulmonary disease, unspecified: Secondary | ICD-10-CM | POA: Diagnosis not present

## 2021-11-06 DIAGNOSIS — J9691 Respiratory failure, unspecified with hypoxia: Secondary | ICD-10-CM | POA: Diagnosis not present

## 2021-11-06 DIAGNOSIS — I10 Essential (primary) hypertension: Secondary | ICD-10-CM | POA: Diagnosis not present

## 2021-11-06 DIAGNOSIS — I359 Nonrheumatic aortic valve disorder, unspecified: Secondary | ICD-10-CM | POA: Diagnosis not present

## 2021-11-06 DIAGNOSIS — F419 Anxiety disorder, unspecified: Secondary | ICD-10-CM | POA: Diagnosis not present

## 2021-11-06 DIAGNOSIS — R634 Abnormal weight loss: Secondary | ICD-10-CM | POA: Diagnosis not present

## 2021-11-06 IMAGING — CT CT HEAD W/O CM
4 of 5 series · 16 of 47 positions shown, 17 images · non-contrast
Comparison: None.

CLINICAL DATA: Unwitnessed fall laceration left-sided head

EXAM:
CT HEAD WITHOUT CONTRAST
CT CERVICAL SPINE WITHOUT CONTRAST
TECHNIQUE: Multidetector CT imaging of the head and cervical spine was
performed following the standard protocol without intravenous
contrast. Multiplanar CT image reconstructions of the cervical spine
were also generated.

[Series 2: ax head wo · axial · 0.32mm/px · z∈[-231,-151]mm · 3 of 36 slices shown, 4 images]
[im 9/36  brain]
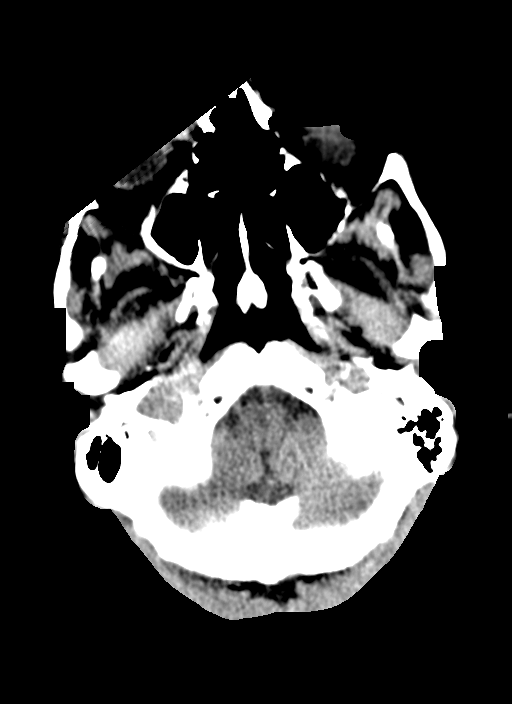
[im 9/36  bone]
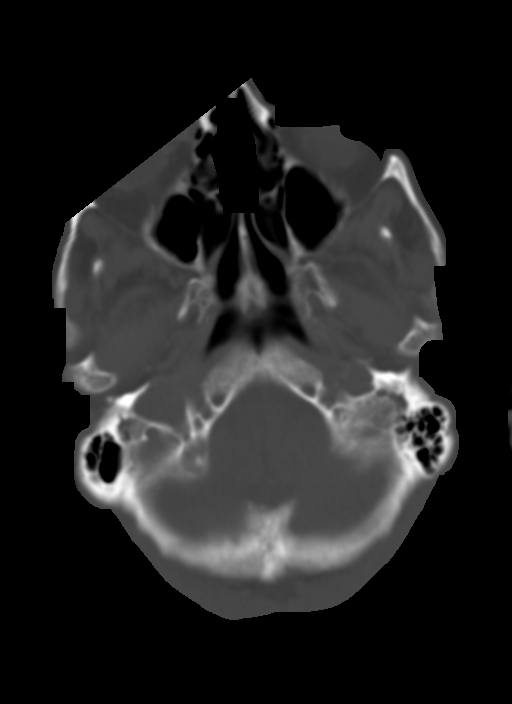
[im 18/36  brain]
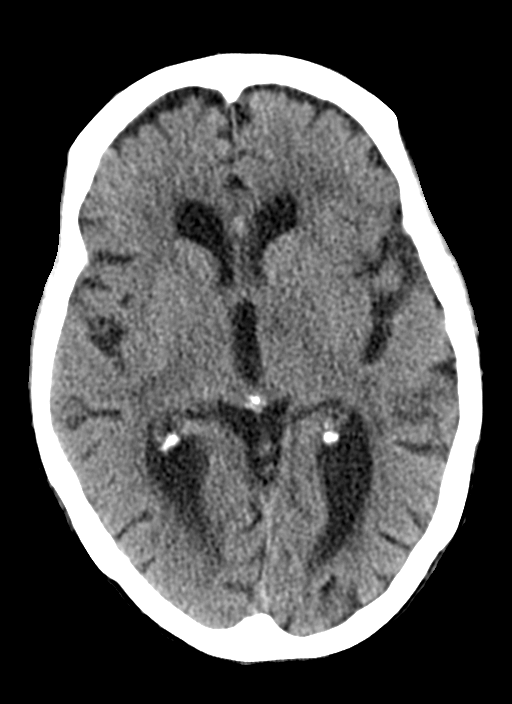
[im 27/36  brain]
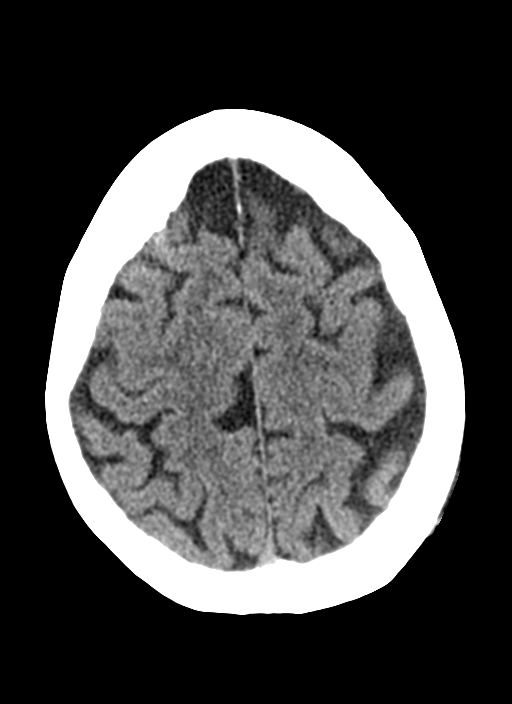

[Series 4: head bone · axial · 0.45mm/px · z∈[-189,-65]mm · 7 of 86 slices shown]
[im 8/86  bone]
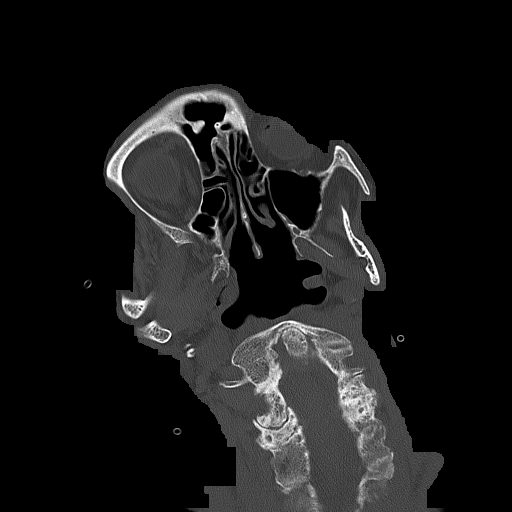
[im 16/86  bone]
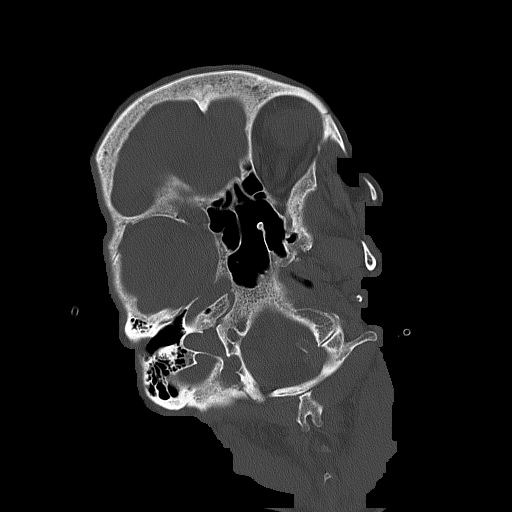
[im 31/86  bone]
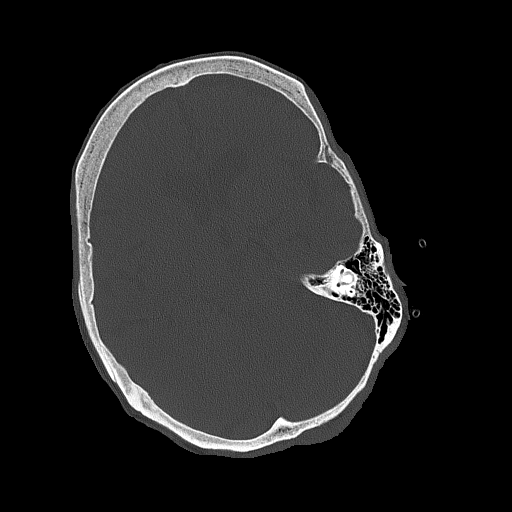
[im 39/86  bone]
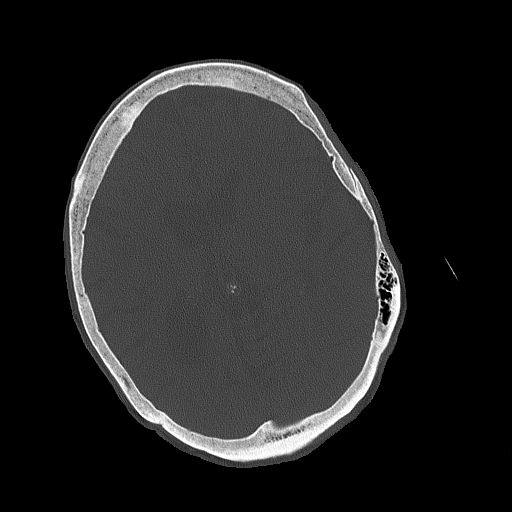
[im 47/86  bone]
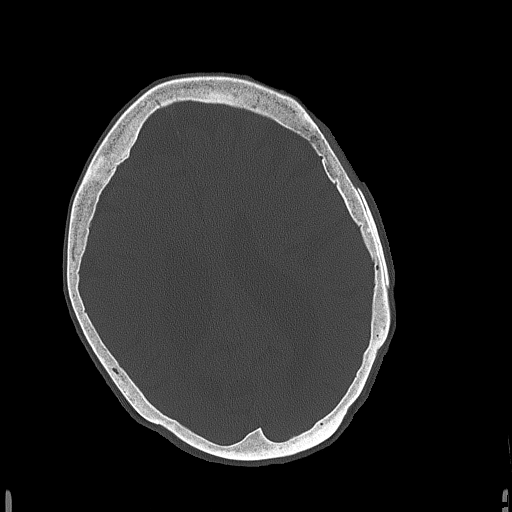
[im 55/86  bone]
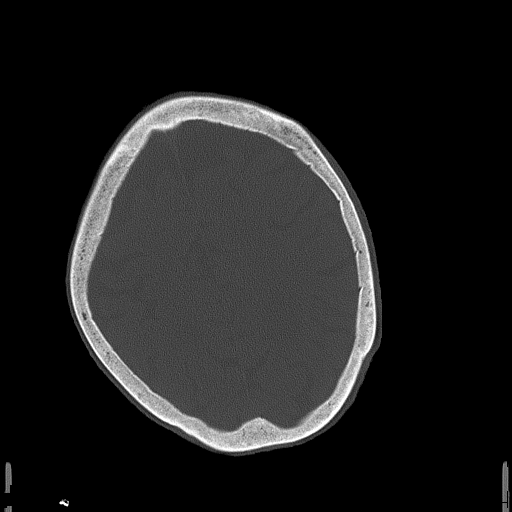
[im 70/86  bone]
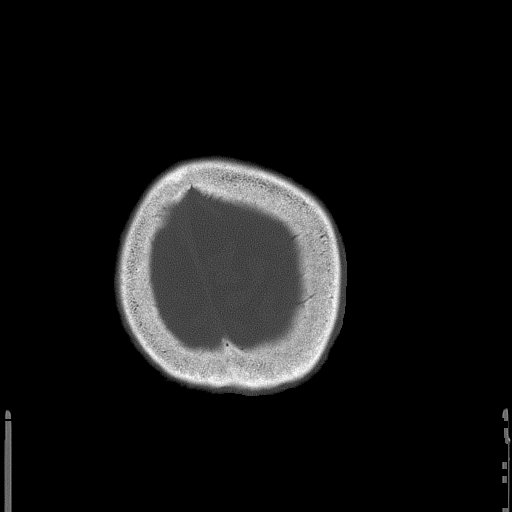

[Series 5: coronal soft tissue · coronal · 0.32mm/px · 3 of 72 slices shown]
[im 25/72  brain]
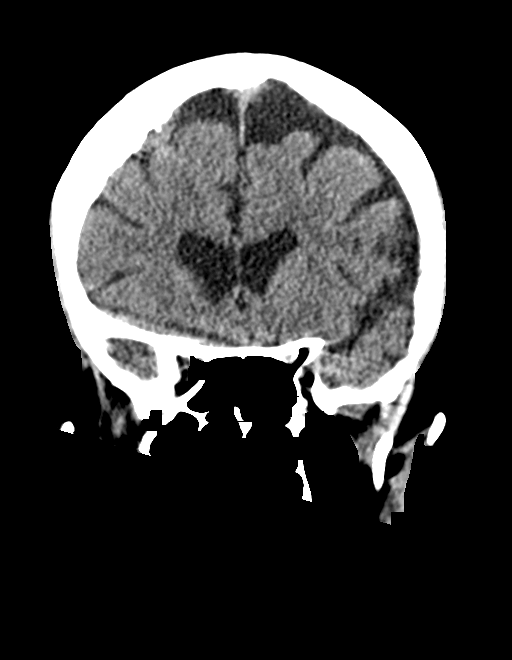
[im 32/72  brain]
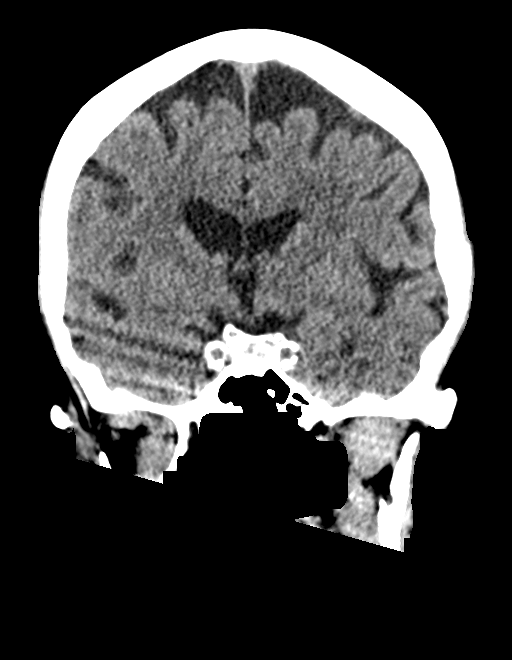
[im 40/72  brain]
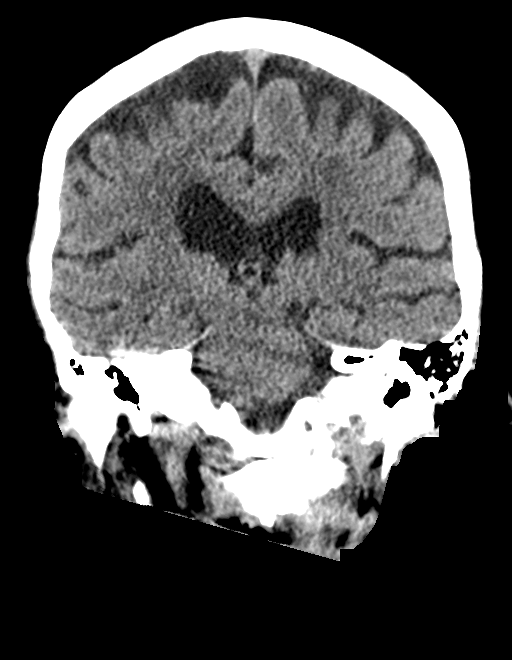

[Series 6: sagittal soft tissue · sagittal · 0.41mm/px · 3 of 55 slices shown]
[im 23/55  brain]
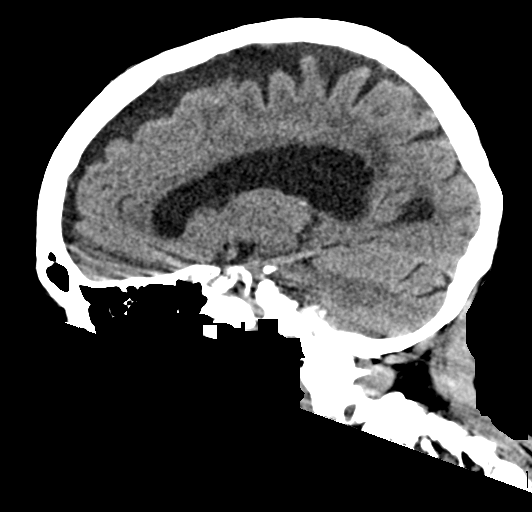
[im 28/55  brain]
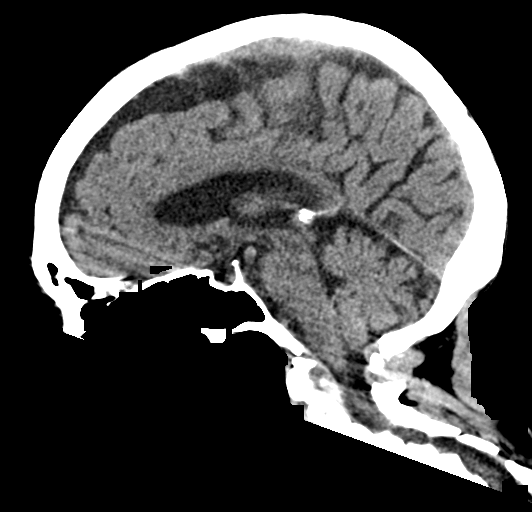
[im 32/55  brain]
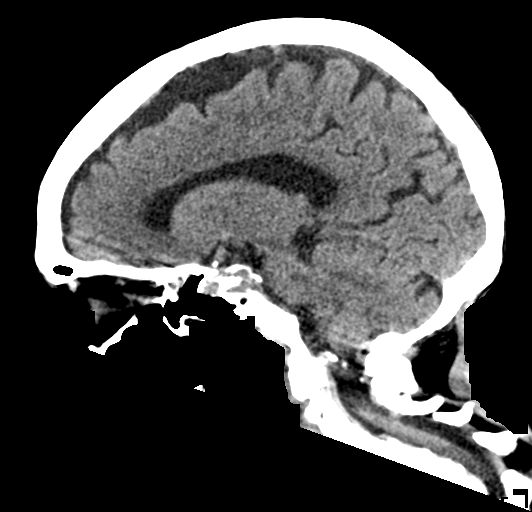

[16 of 47 positions shown; findings below may reference images not displayed]

FINDINGS: CT HEAD FINDINGS

Brain: No acute territorial infarction, hemorrhage, or intracranial
mass. Mild to moderate atrophy. Mild white matter hypodensity
consistent with chronic small vessel ischemic change. Nonenlarged
ventricles

Vascular: No hyperdense vessels.  Carotid vascular calcification

Skull: Normal. Negative for fracture or focal lesion.

Sinuses/Orbits: No acute finding.

Other: Small left parietal scalp swelling

CT CERVICAL SPINE FINDINGS

Alignment: Cervical kyphosis. Trace anterolisthesis C3 on C4,
probably degenerative. Facet alignment within normal limits

Skull base and vertebrae: No acute fracture. No primary bone lesion
or focal pathologic process.

Soft tissues and spinal canal: No prevertebral fluid or swelling. No
visible canal hematoma.

Disc levels: Severe degenerative change throughout the cervical
spine with disc space narrowing, osteophyte and endplate sclerosis.
Facet degenerative changes at multiple levels

Upper chest: Negative.  Status post thyroidectomy

Other: None
IMPRESSION: 1. No CT evidence for acute intracranial abnormality. Atrophy and
chronic small vessel ischemic changes of the white matter.
2. Kyphosis of the cervical spine with diffuse degenerative changes.
No fracture is seen

## 2021-11-07 DIAGNOSIS — R634 Abnormal weight loss: Secondary | ICD-10-CM | POA: Diagnosis not present

## 2021-11-07 DIAGNOSIS — J9691 Respiratory failure, unspecified with hypoxia: Secondary | ICD-10-CM | POA: Diagnosis not present

## 2021-11-07 DIAGNOSIS — F419 Anxiety disorder, unspecified: Secondary | ICD-10-CM | POA: Diagnosis not present

## 2021-11-07 DIAGNOSIS — J449 Chronic obstructive pulmonary disease, unspecified: Secondary | ICD-10-CM | POA: Diagnosis not present

## 2021-11-07 DIAGNOSIS — I359 Nonrheumatic aortic valve disorder, unspecified: Secondary | ICD-10-CM | POA: Diagnosis not present

## 2021-11-07 DIAGNOSIS — I10 Essential (primary) hypertension: Secondary | ICD-10-CM | POA: Diagnosis not present

## 2021-11-08 DIAGNOSIS — F419 Anxiety disorder, unspecified: Secondary | ICD-10-CM | POA: Diagnosis not present

## 2021-11-08 DIAGNOSIS — I10 Essential (primary) hypertension: Secondary | ICD-10-CM | POA: Diagnosis not present

## 2021-11-08 DIAGNOSIS — J9691 Respiratory failure, unspecified with hypoxia: Secondary | ICD-10-CM | POA: Diagnosis not present

## 2021-11-08 DIAGNOSIS — R634 Abnormal weight loss: Secondary | ICD-10-CM | POA: Diagnosis not present

## 2021-11-08 DIAGNOSIS — I359 Nonrheumatic aortic valve disorder, unspecified: Secondary | ICD-10-CM | POA: Diagnosis not present

## 2021-11-08 DIAGNOSIS — J449 Chronic obstructive pulmonary disease, unspecified: Secondary | ICD-10-CM | POA: Diagnosis not present

## 2021-11-11 DIAGNOSIS — J9691 Respiratory failure, unspecified with hypoxia: Secondary | ICD-10-CM | POA: Diagnosis not present

## 2021-11-11 DIAGNOSIS — R634 Abnormal weight loss: Secondary | ICD-10-CM | POA: Diagnosis not present

## 2021-11-11 DIAGNOSIS — F419 Anxiety disorder, unspecified: Secondary | ICD-10-CM | POA: Diagnosis not present

## 2021-11-11 DIAGNOSIS — J449 Chronic obstructive pulmonary disease, unspecified: Secondary | ICD-10-CM | POA: Diagnosis not present

## 2021-11-11 DIAGNOSIS — I10 Essential (primary) hypertension: Secondary | ICD-10-CM | POA: Diagnosis not present

## 2021-11-11 DIAGNOSIS — I359 Nonrheumatic aortic valve disorder, unspecified: Secondary | ICD-10-CM | POA: Diagnosis not present

## 2021-11-13 DIAGNOSIS — J9691 Respiratory failure, unspecified with hypoxia: Secondary | ICD-10-CM | POA: Diagnosis not present

## 2021-11-13 DIAGNOSIS — R634 Abnormal weight loss: Secondary | ICD-10-CM | POA: Diagnosis not present

## 2021-11-13 DIAGNOSIS — J449 Chronic obstructive pulmonary disease, unspecified: Secondary | ICD-10-CM | POA: Diagnosis not present

## 2021-11-13 DIAGNOSIS — F419 Anxiety disorder, unspecified: Secondary | ICD-10-CM | POA: Diagnosis not present

## 2021-11-13 DIAGNOSIS — I359 Nonrheumatic aortic valve disorder, unspecified: Secondary | ICD-10-CM | POA: Diagnosis not present

## 2021-11-13 DIAGNOSIS — I10 Essential (primary) hypertension: Secondary | ICD-10-CM | POA: Diagnosis not present

## 2021-11-14 DIAGNOSIS — I359 Nonrheumatic aortic valve disorder, unspecified: Secondary | ICD-10-CM | POA: Diagnosis not present

## 2021-11-14 DIAGNOSIS — R634 Abnormal weight loss: Secondary | ICD-10-CM | POA: Diagnosis not present

## 2021-11-14 DIAGNOSIS — J9691 Respiratory failure, unspecified with hypoxia: Secondary | ICD-10-CM | POA: Diagnosis not present

## 2021-11-14 DIAGNOSIS — I10 Essential (primary) hypertension: Secondary | ICD-10-CM | POA: Diagnosis not present

## 2021-11-14 DIAGNOSIS — J449 Chronic obstructive pulmonary disease, unspecified: Secondary | ICD-10-CM | POA: Diagnosis not present

## 2021-11-14 DIAGNOSIS — F419 Anxiety disorder, unspecified: Secondary | ICD-10-CM | POA: Diagnosis not present

## 2021-11-18 DIAGNOSIS — R634 Abnormal weight loss: Secondary | ICD-10-CM | POA: Diagnosis not present

## 2021-11-18 DIAGNOSIS — J449 Chronic obstructive pulmonary disease, unspecified: Secondary | ICD-10-CM | POA: Diagnosis not present

## 2021-11-18 DIAGNOSIS — I359 Nonrheumatic aortic valve disorder, unspecified: Secondary | ICD-10-CM | POA: Diagnosis not present

## 2021-11-18 DIAGNOSIS — I10 Essential (primary) hypertension: Secondary | ICD-10-CM | POA: Diagnosis not present

## 2021-11-18 DIAGNOSIS — J9691 Respiratory failure, unspecified with hypoxia: Secondary | ICD-10-CM | POA: Diagnosis not present

## 2021-11-18 DIAGNOSIS — F419 Anxiety disorder, unspecified: Secondary | ICD-10-CM | POA: Diagnosis not present

## 2021-11-20 DIAGNOSIS — J449 Chronic obstructive pulmonary disease, unspecified: Secondary | ICD-10-CM | POA: Diagnosis not present

## 2021-11-20 DIAGNOSIS — I359 Nonrheumatic aortic valve disorder, unspecified: Secondary | ICD-10-CM | POA: Diagnosis not present

## 2021-11-20 DIAGNOSIS — F419 Anxiety disorder, unspecified: Secondary | ICD-10-CM | POA: Diagnosis not present

## 2021-11-20 DIAGNOSIS — R634 Abnormal weight loss: Secondary | ICD-10-CM | POA: Diagnosis not present

## 2021-11-20 DIAGNOSIS — J9691 Respiratory failure, unspecified with hypoxia: Secondary | ICD-10-CM | POA: Diagnosis not present

## 2021-11-20 DIAGNOSIS — I10 Essential (primary) hypertension: Secondary | ICD-10-CM | POA: Diagnosis not present

## 2021-11-21 DIAGNOSIS — R634 Abnormal weight loss: Secondary | ICD-10-CM | POA: Diagnosis not present

## 2021-11-21 DIAGNOSIS — I359 Nonrheumatic aortic valve disorder, unspecified: Secondary | ICD-10-CM | POA: Diagnosis not present

## 2021-11-21 DIAGNOSIS — I10 Essential (primary) hypertension: Secondary | ICD-10-CM | POA: Diagnosis not present

## 2021-11-21 DIAGNOSIS — J449 Chronic obstructive pulmonary disease, unspecified: Secondary | ICD-10-CM | POA: Diagnosis not present

## 2021-11-21 DIAGNOSIS — J9691 Respiratory failure, unspecified with hypoxia: Secondary | ICD-10-CM | POA: Diagnosis not present

## 2021-11-21 DIAGNOSIS — F419 Anxiety disorder, unspecified: Secondary | ICD-10-CM | POA: Diagnosis not present

## 2021-11-23 ENCOUNTER — Other Ambulatory Visit: Payer: Self-pay | Admitting: Family Medicine

## 2021-11-23 NOTE — Telephone Encounter (Signed)
Requested Prescriptions  Pending Prescriptions Disp Refills   DILT-XR 120 MG 24 hr capsule [Pharmacy Med Name: DILTIAZEM XR 120MG  CAPSULES (24 HR)] 270 capsule 0    Sig: TAKE 2 CAPSULES BY MOUTH EVERY MORNING AND 1 EVERY EVENING     Cardiovascular:  Calcium Channel Blockers Failed - 11/23/2021  5:02 PM      Failed - Valid encounter within last 6 months    Recent Outpatient Visits          6 months ago Essential hypertension   [facility] Jerrol Banana., MD   10 months ago Essential hypertension   [facility] Jerrol Banana., MD   1 year ago Bronchiectasis without acute exacerbation [facility])   [facility] Jerrol Banana., MD   1 year ago Generalized anxiety disorder   [facility] Jerrol Banana., MD   1 year ago Generalized anxiety disorder   [facility] Jerrol Banana., MD             Passed - Last BP in normal range    BP Readings from Last 1 Encounters:  07/02/21 130/70          Has Cardizem rx on current list from Historical Provider, however, it states pt does not take Cardizem anymore.

## 2021-11-25 DIAGNOSIS — I359 Nonrheumatic aortic valve disorder, unspecified: Secondary | ICD-10-CM | POA: Diagnosis not present

## 2021-11-25 DIAGNOSIS — F419 Anxiety disorder, unspecified: Secondary | ICD-10-CM | POA: Diagnosis not present

## 2021-11-25 DIAGNOSIS — J449 Chronic obstructive pulmonary disease, unspecified: Secondary | ICD-10-CM | POA: Diagnosis not present

## 2021-11-25 DIAGNOSIS — R634 Abnormal weight loss: Secondary | ICD-10-CM | POA: Diagnosis not present

## 2021-11-25 DIAGNOSIS — I10 Essential (primary) hypertension: Secondary | ICD-10-CM | POA: Diagnosis not present

## 2021-11-25 DIAGNOSIS — J9691 Respiratory failure, unspecified with hypoxia: Secondary | ICD-10-CM | POA: Diagnosis not present

## 2021-11-27 DIAGNOSIS — F419 Anxiety disorder, unspecified: Secondary | ICD-10-CM | POA: Diagnosis not present

## 2021-11-27 DIAGNOSIS — I359 Nonrheumatic aortic valve disorder, unspecified: Secondary | ICD-10-CM | POA: Diagnosis not present

## 2021-11-27 DIAGNOSIS — I10 Essential (primary) hypertension: Secondary | ICD-10-CM | POA: Diagnosis not present

## 2021-11-27 DIAGNOSIS — J9691 Respiratory failure, unspecified with hypoxia: Secondary | ICD-10-CM | POA: Diagnosis not present

## 2021-11-27 DIAGNOSIS — R634 Abnormal weight loss: Secondary | ICD-10-CM | POA: Diagnosis not present

## 2021-11-27 DIAGNOSIS — J449 Chronic obstructive pulmonary disease, unspecified: Secondary | ICD-10-CM | POA: Diagnosis not present

## 2021-11-29 DIAGNOSIS — J449 Chronic obstructive pulmonary disease, unspecified: Secondary | ICD-10-CM | POA: Diagnosis not present

## 2021-11-29 DIAGNOSIS — R634 Abnormal weight loss: Secondary | ICD-10-CM | POA: Diagnosis not present

## 2021-11-29 DIAGNOSIS — F419 Anxiety disorder, unspecified: Secondary | ICD-10-CM | POA: Diagnosis not present

## 2021-11-29 DIAGNOSIS — J9691 Respiratory failure, unspecified with hypoxia: Secondary | ICD-10-CM | POA: Diagnosis not present

## 2021-11-29 DIAGNOSIS — I359 Nonrheumatic aortic valve disorder, unspecified: Secondary | ICD-10-CM | POA: Diagnosis not present

## 2021-11-29 DIAGNOSIS — I10 Essential (primary) hypertension: Secondary | ICD-10-CM | POA: Diagnosis not present

## 2021-12-02 DIAGNOSIS — J9691 Respiratory failure, unspecified with hypoxia: Secondary | ICD-10-CM | POA: Diagnosis not present

## 2021-12-02 DIAGNOSIS — F419 Anxiety disorder, unspecified: Secondary | ICD-10-CM | POA: Diagnosis not present

## 2021-12-02 DIAGNOSIS — I359 Nonrheumatic aortic valve disorder, unspecified: Secondary | ICD-10-CM | POA: Diagnosis not present

## 2021-12-02 DIAGNOSIS — I10 Essential (primary) hypertension: Secondary | ICD-10-CM | POA: Diagnosis not present

## 2021-12-02 DIAGNOSIS — J449 Chronic obstructive pulmonary disease, unspecified: Secondary | ICD-10-CM | POA: Diagnosis not present

## 2021-12-02 DIAGNOSIS — R634 Abnormal weight loss: Secondary | ICD-10-CM | POA: Diagnosis not present

## 2021-12-05 DIAGNOSIS — M1990 Unspecified osteoarthritis, unspecified site: Secondary | ICD-10-CM | POA: Diagnosis not present

## 2021-12-05 DIAGNOSIS — L89322 Pressure ulcer of left buttock, stage 2: Secondary | ICD-10-CM | POA: Diagnosis not present

## 2021-12-05 DIAGNOSIS — R634 Abnormal weight loss: Secondary | ICD-10-CM | POA: Diagnosis not present

## 2021-12-05 DIAGNOSIS — R159 Full incontinence of feces: Secondary | ICD-10-CM | POA: Diagnosis not present

## 2021-12-05 DIAGNOSIS — Z872 Personal history of diseases of the skin and subcutaneous tissue: Secondary | ICD-10-CM | POA: Diagnosis not present

## 2021-12-05 DIAGNOSIS — Z9981 Dependence on supplemental oxygen: Secondary | ICD-10-CM | POA: Diagnosis not present

## 2021-12-05 DIAGNOSIS — E43 Unspecified severe protein-calorie malnutrition: Secondary | ICD-10-CM | POA: Diagnosis not present

## 2021-12-05 DIAGNOSIS — Z6821 Body mass index (BMI) 21.0-21.9, adult: Secondary | ICD-10-CM | POA: Diagnosis not present

## 2021-12-05 DIAGNOSIS — J449 Chronic obstructive pulmonary disease, unspecified: Secondary | ICD-10-CM | POA: Diagnosis not present

## 2021-12-05 DIAGNOSIS — F419 Anxiety disorder, unspecified: Secondary | ICD-10-CM | POA: Diagnosis not present

## 2021-12-05 DIAGNOSIS — Z85850 Personal history of malignant neoplasm of thyroid: Secondary | ICD-10-CM | POA: Diagnosis not present

## 2021-12-05 DIAGNOSIS — R296 Repeated falls: Secondary | ICD-10-CM | POA: Diagnosis not present

## 2021-12-05 DIAGNOSIS — I359 Nonrheumatic aortic valve disorder, unspecified: Secondary | ICD-10-CM | POA: Diagnosis not present

## 2021-12-05 DIAGNOSIS — J9691 Respiratory failure, unspecified with hypoxia: Secondary | ICD-10-CM | POA: Diagnosis not present

## 2021-12-05 DIAGNOSIS — I10 Essential (primary) hypertension: Secondary | ICD-10-CM | POA: Diagnosis not present

## 2021-12-05 DIAGNOSIS — R32 Unspecified urinary incontinence: Secondary | ICD-10-CM | POA: Diagnosis not present

## 2021-12-05 DIAGNOSIS — Z741 Need for assistance with personal care: Secondary | ICD-10-CM | POA: Diagnosis not present

## 2021-12-05 DIAGNOSIS — E890 Postprocedural hypothyroidism: Secondary | ICD-10-CM | POA: Diagnosis not present

## 2021-12-05 DIAGNOSIS — R627 Adult failure to thrive: Secondary | ICD-10-CM | POA: Diagnosis not present

## 2021-12-05 DIAGNOSIS — L89153 Pressure ulcer of sacral region, stage 3: Secondary | ICD-10-CM | POA: Diagnosis not present

## 2021-12-06 DIAGNOSIS — R634 Abnormal weight loss: Secondary | ICD-10-CM | POA: Diagnosis not present

## 2021-12-06 DIAGNOSIS — I10 Essential (primary) hypertension: Secondary | ICD-10-CM | POA: Diagnosis not present

## 2021-12-06 DIAGNOSIS — I359 Nonrheumatic aortic valve disorder, unspecified: Secondary | ICD-10-CM | POA: Diagnosis not present

## 2021-12-06 DIAGNOSIS — J9691 Respiratory failure, unspecified with hypoxia: Secondary | ICD-10-CM | POA: Diagnosis not present

## 2021-12-06 DIAGNOSIS — J449 Chronic obstructive pulmonary disease, unspecified: Secondary | ICD-10-CM | POA: Diagnosis not present

## 2021-12-06 DIAGNOSIS — F419 Anxiety disorder, unspecified: Secondary | ICD-10-CM | POA: Diagnosis not present

## 2021-12-09 DIAGNOSIS — J9691 Respiratory failure, unspecified with hypoxia: Secondary | ICD-10-CM | POA: Diagnosis not present

## 2021-12-09 DIAGNOSIS — F419 Anxiety disorder, unspecified: Secondary | ICD-10-CM | POA: Diagnosis not present

## 2021-12-09 DIAGNOSIS — I359 Nonrheumatic aortic valve disorder, unspecified: Secondary | ICD-10-CM | POA: Diagnosis not present

## 2021-12-09 DIAGNOSIS — R634 Abnormal weight loss: Secondary | ICD-10-CM | POA: Diagnosis not present

## 2021-12-09 DIAGNOSIS — J449 Chronic obstructive pulmonary disease, unspecified: Secondary | ICD-10-CM | POA: Diagnosis not present

## 2021-12-09 DIAGNOSIS — I10 Essential (primary) hypertension: Secondary | ICD-10-CM | POA: Diagnosis not present

## 2021-12-11 DIAGNOSIS — J9691 Respiratory failure, unspecified with hypoxia: Secondary | ICD-10-CM | POA: Diagnosis not present

## 2021-12-11 DIAGNOSIS — F419 Anxiety disorder, unspecified: Secondary | ICD-10-CM | POA: Diagnosis not present

## 2021-12-11 DIAGNOSIS — I10 Essential (primary) hypertension: Secondary | ICD-10-CM | POA: Diagnosis not present

## 2021-12-11 DIAGNOSIS — R634 Abnormal weight loss: Secondary | ICD-10-CM | POA: Diagnosis not present

## 2021-12-11 DIAGNOSIS — I359 Nonrheumatic aortic valve disorder, unspecified: Secondary | ICD-10-CM | POA: Diagnosis not present

## 2021-12-11 DIAGNOSIS — J449 Chronic obstructive pulmonary disease, unspecified: Secondary | ICD-10-CM | POA: Diagnosis not present

## 2021-12-12 DIAGNOSIS — R634 Abnormal weight loss: Secondary | ICD-10-CM | POA: Diagnosis not present

## 2021-12-12 DIAGNOSIS — I359 Nonrheumatic aortic valve disorder, unspecified: Secondary | ICD-10-CM | POA: Diagnosis not present

## 2021-12-12 DIAGNOSIS — J9691 Respiratory failure, unspecified with hypoxia: Secondary | ICD-10-CM | POA: Diagnosis not present

## 2021-12-12 DIAGNOSIS — J449 Chronic obstructive pulmonary disease, unspecified: Secondary | ICD-10-CM | POA: Diagnosis not present

## 2021-12-12 DIAGNOSIS — I10 Essential (primary) hypertension: Secondary | ICD-10-CM | POA: Diagnosis not present

## 2021-12-12 DIAGNOSIS — F419 Anxiety disorder, unspecified: Secondary | ICD-10-CM | POA: Diagnosis not present

## 2021-12-13 DIAGNOSIS — I359 Nonrheumatic aortic valve disorder, unspecified: Secondary | ICD-10-CM | POA: Diagnosis not present

## 2021-12-13 DIAGNOSIS — I10 Essential (primary) hypertension: Secondary | ICD-10-CM | POA: Diagnosis not present

## 2021-12-13 DIAGNOSIS — F419 Anxiety disorder, unspecified: Secondary | ICD-10-CM | POA: Diagnosis not present

## 2021-12-13 DIAGNOSIS — J449 Chronic obstructive pulmonary disease, unspecified: Secondary | ICD-10-CM | POA: Diagnosis not present

## 2021-12-13 DIAGNOSIS — R634 Abnormal weight loss: Secondary | ICD-10-CM | POA: Diagnosis not present

## 2021-12-13 DIAGNOSIS — J9691 Respiratory failure, unspecified with hypoxia: Secondary | ICD-10-CM | POA: Diagnosis not present

## 2021-12-16 DIAGNOSIS — J449 Chronic obstructive pulmonary disease, unspecified: Secondary | ICD-10-CM | POA: Diagnosis not present

## 2021-12-16 DIAGNOSIS — I359 Nonrheumatic aortic valve disorder, unspecified: Secondary | ICD-10-CM | POA: Diagnosis not present

## 2021-12-16 DIAGNOSIS — I10 Essential (primary) hypertension: Secondary | ICD-10-CM | POA: Diagnosis not present

## 2021-12-16 DIAGNOSIS — J9691 Respiratory failure, unspecified with hypoxia: Secondary | ICD-10-CM | POA: Diagnosis not present

## 2021-12-16 DIAGNOSIS — R634 Abnormal weight loss: Secondary | ICD-10-CM | POA: Diagnosis not present

## 2021-12-16 DIAGNOSIS — F419 Anxiety disorder, unspecified: Secondary | ICD-10-CM | POA: Diagnosis not present

## 2021-12-18 DIAGNOSIS — R634 Abnormal weight loss: Secondary | ICD-10-CM | POA: Diagnosis not present

## 2021-12-18 DIAGNOSIS — I10 Essential (primary) hypertension: Secondary | ICD-10-CM | POA: Diagnosis not present

## 2021-12-18 DIAGNOSIS — J449 Chronic obstructive pulmonary disease, unspecified: Secondary | ICD-10-CM | POA: Diagnosis not present

## 2021-12-18 DIAGNOSIS — J9691 Respiratory failure, unspecified with hypoxia: Secondary | ICD-10-CM | POA: Diagnosis not present

## 2021-12-18 DIAGNOSIS — F419 Anxiety disorder, unspecified: Secondary | ICD-10-CM | POA: Diagnosis not present

## 2021-12-18 DIAGNOSIS — I359 Nonrheumatic aortic valve disorder, unspecified: Secondary | ICD-10-CM | POA: Diagnosis not present

## 2021-12-19 DIAGNOSIS — F419 Anxiety disorder, unspecified: Secondary | ICD-10-CM | POA: Diagnosis not present

## 2021-12-19 DIAGNOSIS — J9691 Respiratory failure, unspecified with hypoxia: Secondary | ICD-10-CM | POA: Diagnosis not present

## 2021-12-19 DIAGNOSIS — J449 Chronic obstructive pulmonary disease, unspecified: Secondary | ICD-10-CM | POA: Diagnosis not present

## 2021-12-19 DIAGNOSIS — R634 Abnormal weight loss: Secondary | ICD-10-CM | POA: Diagnosis not present

## 2021-12-19 DIAGNOSIS — I359 Nonrheumatic aortic valve disorder, unspecified: Secondary | ICD-10-CM | POA: Diagnosis not present

## 2021-12-19 DIAGNOSIS — I10 Essential (primary) hypertension: Secondary | ICD-10-CM | POA: Diagnosis not present

## 2021-12-25 DIAGNOSIS — I10 Essential (primary) hypertension: Secondary | ICD-10-CM | POA: Diagnosis not present

## 2021-12-25 DIAGNOSIS — J9691 Respiratory failure, unspecified with hypoxia: Secondary | ICD-10-CM | POA: Diagnosis not present

## 2021-12-25 DIAGNOSIS — J449 Chronic obstructive pulmonary disease, unspecified: Secondary | ICD-10-CM | POA: Diagnosis not present

## 2021-12-25 DIAGNOSIS — I359 Nonrheumatic aortic valve disorder, unspecified: Secondary | ICD-10-CM | POA: Diagnosis not present

## 2021-12-25 DIAGNOSIS — R634 Abnormal weight loss: Secondary | ICD-10-CM | POA: Diagnosis not present

## 2021-12-25 DIAGNOSIS — F419 Anxiety disorder, unspecified: Secondary | ICD-10-CM | POA: Diagnosis not present

## 2021-12-29 DIAGNOSIS — I10 Essential (primary) hypertension: Secondary | ICD-10-CM | POA: Diagnosis not present

## 2021-12-29 DIAGNOSIS — I359 Nonrheumatic aortic valve disorder, unspecified: Secondary | ICD-10-CM | POA: Diagnosis not present

## 2021-12-29 DIAGNOSIS — J449 Chronic obstructive pulmonary disease, unspecified: Secondary | ICD-10-CM | POA: Diagnosis not present

## 2021-12-29 DIAGNOSIS — F419 Anxiety disorder, unspecified: Secondary | ICD-10-CM | POA: Diagnosis not present

## 2021-12-29 DIAGNOSIS — J9691 Respiratory failure, unspecified with hypoxia: Secondary | ICD-10-CM | POA: Diagnosis not present

## 2021-12-29 DIAGNOSIS — R634 Abnormal weight loss: Secondary | ICD-10-CM | POA: Diagnosis not present

## 2021-12-30 DIAGNOSIS — R634 Abnormal weight loss: Secondary | ICD-10-CM | POA: Diagnosis not present

## 2021-12-30 DIAGNOSIS — F419 Anxiety disorder, unspecified: Secondary | ICD-10-CM | POA: Diagnosis not present

## 2021-12-30 DIAGNOSIS — I359 Nonrheumatic aortic valve disorder, unspecified: Secondary | ICD-10-CM | POA: Diagnosis not present

## 2021-12-30 DIAGNOSIS — J9691 Respiratory failure, unspecified with hypoxia: Secondary | ICD-10-CM | POA: Diagnosis not present

## 2021-12-30 DIAGNOSIS — I10 Essential (primary) hypertension: Secondary | ICD-10-CM | POA: Diagnosis not present

## 2021-12-30 DIAGNOSIS — J449 Chronic obstructive pulmonary disease, unspecified: Secondary | ICD-10-CM | POA: Diagnosis not present

## 2021-12-31 DIAGNOSIS — F419 Anxiety disorder, unspecified: Secondary | ICD-10-CM | POA: Diagnosis not present

## 2021-12-31 DIAGNOSIS — J9691 Respiratory failure, unspecified with hypoxia: Secondary | ICD-10-CM | POA: Diagnosis not present

## 2021-12-31 DIAGNOSIS — R634 Abnormal weight loss: Secondary | ICD-10-CM | POA: Diagnosis not present

## 2021-12-31 DIAGNOSIS — I10 Essential (primary) hypertension: Secondary | ICD-10-CM | POA: Diagnosis not present

## 2021-12-31 DIAGNOSIS — J449 Chronic obstructive pulmonary disease, unspecified: Secondary | ICD-10-CM | POA: Diagnosis not present

## 2021-12-31 DIAGNOSIS — I359 Nonrheumatic aortic valve disorder, unspecified: Secondary | ICD-10-CM | POA: Diagnosis not present

## 2022-01-01 DIAGNOSIS — R634 Abnormal weight loss: Secondary | ICD-10-CM | POA: Diagnosis not present

## 2022-01-01 DIAGNOSIS — I10 Essential (primary) hypertension: Secondary | ICD-10-CM | POA: Diagnosis not present

## 2022-01-01 DIAGNOSIS — F419 Anxiety disorder, unspecified: Secondary | ICD-10-CM | POA: Diagnosis not present

## 2022-01-01 DIAGNOSIS — J9691 Respiratory failure, unspecified with hypoxia: Secondary | ICD-10-CM | POA: Diagnosis not present

## 2022-01-01 DIAGNOSIS — I359 Nonrheumatic aortic valve disorder, unspecified: Secondary | ICD-10-CM | POA: Diagnosis not present

## 2022-01-01 DIAGNOSIS — J449 Chronic obstructive pulmonary disease, unspecified: Secondary | ICD-10-CM | POA: Diagnosis not present

## 2022-01-02 DIAGNOSIS — I10 Essential (primary) hypertension: Secondary | ICD-10-CM | POA: Diagnosis not present

## 2022-01-02 DIAGNOSIS — J9691 Respiratory failure, unspecified with hypoxia: Secondary | ICD-10-CM | POA: Diagnosis not present

## 2022-01-02 DIAGNOSIS — L89153 Pressure ulcer of sacral region, stage 3: Secondary | ICD-10-CM | POA: Diagnosis not present

## 2022-01-02 DIAGNOSIS — J449 Chronic obstructive pulmonary disease, unspecified: Secondary | ICD-10-CM | POA: Diagnosis not present

## 2022-01-02 DIAGNOSIS — Z9981 Dependence on supplemental oxygen: Secondary | ICD-10-CM | POA: Diagnosis not present

## 2022-01-02 DIAGNOSIS — M1990 Unspecified osteoarthritis, unspecified site: Secondary | ICD-10-CM | POA: Diagnosis not present

## 2022-01-02 DIAGNOSIS — R296 Repeated falls: Secondary | ICD-10-CM | POA: Diagnosis not present

## 2022-01-02 DIAGNOSIS — I359 Nonrheumatic aortic valve disorder, unspecified: Secondary | ICD-10-CM | POA: Diagnosis not present

## 2022-01-02 DIAGNOSIS — L89322 Pressure ulcer of left buttock, stage 2: Secondary | ICD-10-CM | POA: Diagnosis not present

## 2022-01-02 DIAGNOSIS — R159 Full incontinence of feces: Secondary | ICD-10-CM | POA: Diagnosis not present

## 2022-01-02 DIAGNOSIS — F419 Anxiety disorder, unspecified: Secondary | ICD-10-CM | POA: Diagnosis not present

## 2022-01-02 DIAGNOSIS — Z6821 Body mass index (BMI) 21.0-21.9, adult: Secondary | ICD-10-CM | POA: Diagnosis not present

## 2022-01-02 DIAGNOSIS — Z85850 Personal history of malignant neoplasm of thyroid: Secondary | ICD-10-CM | POA: Diagnosis not present

## 2022-01-02 DIAGNOSIS — E890 Postprocedural hypothyroidism: Secondary | ICD-10-CM | POA: Diagnosis not present

## 2022-01-02 DIAGNOSIS — E43 Unspecified severe protein-calorie malnutrition: Secondary | ICD-10-CM | POA: Diagnosis not present

## 2022-01-02 DIAGNOSIS — R634 Abnormal weight loss: Secondary | ICD-10-CM | POA: Diagnosis not present

## 2022-01-02 DIAGNOSIS — Z872 Personal history of diseases of the skin and subcutaneous tissue: Secondary | ICD-10-CM | POA: Diagnosis not present

## 2022-01-02 DIAGNOSIS — R627 Adult failure to thrive: Secondary | ICD-10-CM | POA: Diagnosis not present

## 2022-01-02 DIAGNOSIS — Z741 Need for assistance with personal care: Secondary | ICD-10-CM | POA: Diagnosis not present

## 2022-01-02 DIAGNOSIS — R32 Unspecified urinary incontinence: Secondary | ICD-10-CM | POA: Diagnosis not present

## 2022-01-03 DIAGNOSIS — F419 Anxiety disorder, unspecified: Secondary | ICD-10-CM | POA: Diagnosis not present

## 2022-01-03 DIAGNOSIS — I359 Nonrheumatic aortic valve disorder, unspecified: Secondary | ICD-10-CM | POA: Diagnosis not present

## 2022-01-03 DIAGNOSIS — J449 Chronic obstructive pulmonary disease, unspecified: Secondary | ICD-10-CM | POA: Diagnosis not present

## 2022-01-03 DIAGNOSIS — R634 Abnormal weight loss: Secondary | ICD-10-CM | POA: Diagnosis not present

## 2022-01-03 DIAGNOSIS — J9691 Respiratory failure, unspecified with hypoxia: Secondary | ICD-10-CM | POA: Diagnosis not present

## 2022-01-03 DIAGNOSIS — I10 Essential (primary) hypertension: Secondary | ICD-10-CM | POA: Diagnosis not present

## 2022-01-04 DIAGNOSIS — J449 Chronic obstructive pulmonary disease, unspecified: Secondary | ICD-10-CM | POA: Diagnosis not present

## 2022-01-04 DIAGNOSIS — J9691 Respiratory failure, unspecified with hypoxia: Secondary | ICD-10-CM | POA: Diagnosis not present

## 2022-01-04 DIAGNOSIS — F419 Anxiety disorder, unspecified: Secondary | ICD-10-CM | POA: Diagnosis not present

## 2022-01-04 DIAGNOSIS — I359 Nonrheumatic aortic valve disorder, unspecified: Secondary | ICD-10-CM | POA: Diagnosis not present

## 2022-01-04 DIAGNOSIS — R634 Abnormal weight loss: Secondary | ICD-10-CM | POA: Diagnosis not present

## 2022-01-04 DIAGNOSIS — I10 Essential (primary) hypertension: Secondary | ICD-10-CM | POA: Diagnosis not present

## 2022-01-07 DIAGNOSIS — J449 Chronic obstructive pulmonary disease, unspecified: Secondary | ICD-10-CM | POA: Diagnosis not present

## 2022-01-07 DIAGNOSIS — J9691 Respiratory failure, unspecified with hypoxia: Secondary | ICD-10-CM | POA: Diagnosis not present

## 2022-01-07 DIAGNOSIS — R634 Abnormal weight loss: Secondary | ICD-10-CM | POA: Diagnosis not present

## 2022-01-07 DIAGNOSIS — F419 Anxiety disorder, unspecified: Secondary | ICD-10-CM | POA: Diagnosis not present

## 2022-01-07 DIAGNOSIS — I359 Nonrheumatic aortic valve disorder, unspecified: Secondary | ICD-10-CM | POA: Diagnosis not present

## 2022-01-07 DIAGNOSIS — I10 Essential (primary) hypertension: Secondary | ICD-10-CM | POA: Diagnosis not present

## 2022-01-08 DIAGNOSIS — J9691 Respiratory failure, unspecified with hypoxia: Secondary | ICD-10-CM | POA: Diagnosis not present

## 2022-01-08 DIAGNOSIS — I359 Nonrheumatic aortic valve disorder, unspecified: Secondary | ICD-10-CM | POA: Diagnosis not present

## 2022-01-08 DIAGNOSIS — R634 Abnormal weight loss: Secondary | ICD-10-CM | POA: Diagnosis not present

## 2022-01-08 DIAGNOSIS — I10 Essential (primary) hypertension: Secondary | ICD-10-CM | POA: Diagnosis not present

## 2022-01-08 DIAGNOSIS — F419 Anxiety disorder, unspecified: Secondary | ICD-10-CM | POA: Diagnosis not present

## 2022-01-08 DIAGNOSIS — J449 Chronic obstructive pulmonary disease, unspecified: Secondary | ICD-10-CM | POA: Diagnosis not present

## 2022-01-11 DIAGNOSIS — J9691 Respiratory failure, unspecified with hypoxia: Secondary | ICD-10-CM | POA: Diagnosis not present

## 2022-01-11 DIAGNOSIS — I359 Nonrheumatic aortic valve disorder, unspecified: Secondary | ICD-10-CM | POA: Diagnosis not present

## 2022-01-11 DIAGNOSIS — I10 Essential (primary) hypertension: Secondary | ICD-10-CM | POA: Diagnosis not present

## 2022-01-11 DIAGNOSIS — J449 Chronic obstructive pulmonary disease, unspecified: Secondary | ICD-10-CM | POA: Diagnosis not present

## 2022-01-11 DIAGNOSIS — F419 Anxiety disorder, unspecified: Secondary | ICD-10-CM | POA: Diagnosis not present

## 2022-01-11 DIAGNOSIS — R634 Abnormal weight loss: Secondary | ICD-10-CM | POA: Diagnosis not present

## 2022-01-15 DIAGNOSIS — R634 Abnormal weight loss: Secondary | ICD-10-CM | POA: Diagnosis not present

## 2022-01-15 DIAGNOSIS — J449 Chronic obstructive pulmonary disease, unspecified: Secondary | ICD-10-CM | POA: Diagnosis not present

## 2022-01-15 DIAGNOSIS — I359 Nonrheumatic aortic valve disorder, unspecified: Secondary | ICD-10-CM | POA: Diagnosis not present

## 2022-01-15 DIAGNOSIS — J9691 Respiratory failure, unspecified with hypoxia: Secondary | ICD-10-CM | POA: Diagnosis not present

## 2022-01-15 DIAGNOSIS — I10 Essential (primary) hypertension: Secondary | ICD-10-CM | POA: Diagnosis not present

## 2022-01-15 DIAGNOSIS — F419 Anxiety disorder, unspecified: Secondary | ICD-10-CM | POA: Diagnosis not present

## 2022-01-17 DIAGNOSIS — R634 Abnormal weight loss: Secondary | ICD-10-CM | POA: Diagnosis not present

## 2022-01-17 DIAGNOSIS — J9691 Respiratory failure, unspecified with hypoxia: Secondary | ICD-10-CM | POA: Diagnosis not present

## 2022-01-17 DIAGNOSIS — J449 Chronic obstructive pulmonary disease, unspecified: Secondary | ICD-10-CM | POA: Diagnosis not present

## 2022-01-17 DIAGNOSIS — I359 Nonrheumatic aortic valve disorder, unspecified: Secondary | ICD-10-CM | POA: Diagnosis not present

## 2022-01-17 DIAGNOSIS — I10 Essential (primary) hypertension: Secondary | ICD-10-CM | POA: Diagnosis not present

## 2022-01-17 DIAGNOSIS — F419 Anxiety disorder, unspecified: Secondary | ICD-10-CM | POA: Diagnosis not present

## 2022-01-20 DIAGNOSIS — R634 Abnormal weight loss: Secondary | ICD-10-CM | POA: Diagnosis not present

## 2022-01-20 DIAGNOSIS — F419 Anxiety disorder, unspecified: Secondary | ICD-10-CM | POA: Diagnosis not present

## 2022-01-20 DIAGNOSIS — J449 Chronic obstructive pulmonary disease, unspecified: Secondary | ICD-10-CM | POA: Diagnosis not present

## 2022-01-20 DIAGNOSIS — J9691 Respiratory failure, unspecified with hypoxia: Secondary | ICD-10-CM | POA: Diagnosis not present

## 2022-01-20 DIAGNOSIS — I359 Nonrheumatic aortic valve disorder, unspecified: Secondary | ICD-10-CM | POA: Diagnosis not present

## 2022-01-20 DIAGNOSIS — I10 Essential (primary) hypertension: Secondary | ICD-10-CM | POA: Diagnosis not present

## 2022-01-22 DIAGNOSIS — J449 Chronic obstructive pulmonary disease, unspecified: Secondary | ICD-10-CM | POA: Diagnosis not present

## 2022-01-22 DIAGNOSIS — I10 Essential (primary) hypertension: Secondary | ICD-10-CM | POA: Diagnosis not present

## 2022-01-22 DIAGNOSIS — J9691 Respiratory failure, unspecified with hypoxia: Secondary | ICD-10-CM | POA: Diagnosis not present

## 2022-01-22 DIAGNOSIS — I359 Nonrheumatic aortic valve disorder, unspecified: Secondary | ICD-10-CM | POA: Diagnosis not present

## 2022-01-22 DIAGNOSIS — F419 Anxiety disorder, unspecified: Secondary | ICD-10-CM | POA: Diagnosis not present

## 2022-01-22 DIAGNOSIS — R634 Abnormal weight loss: Secondary | ICD-10-CM | POA: Diagnosis not present

## 2022-01-23 DIAGNOSIS — I359 Nonrheumatic aortic valve disorder, unspecified: Secondary | ICD-10-CM | POA: Diagnosis not present

## 2022-01-23 DIAGNOSIS — R634 Abnormal weight loss: Secondary | ICD-10-CM | POA: Diagnosis not present

## 2022-01-23 DIAGNOSIS — F419 Anxiety disorder, unspecified: Secondary | ICD-10-CM | POA: Diagnosis not present

## 2022-01-23 DIAGNOSIS — J449 Chronic obstructive pulmonary disease, unspecified: Secondary | ICD-10-CM | POA: Diagnosis not present

## 2022-01-23 DIAGNOSIS — I10 Essential (primary) hypertension: Secondary | ICD-10-CM | POA: Diagnosis not present

## 2022-01-23 DIAGNOSIS — J9691 Respiratory failure, unspecified with hypoxia: Secondary | ICD-10-CM | POA: Diagnosis not present

## 2022-01-24 DIAGNOSIS — J9691 Respiratory failure, unspecified with hypoxia: Secondary | ICD-10-CM | POA: Diagnosis not present

## 2022-01-24 DIAGNOSIS — I10 Essential (primary) hypertension: Secondary | ICD-10-CM | POA: Diagnosis not present

## 2022-01-24 DIAGNOSIS — J449 Chronic obstructive pulmonary disease, unspecified: Secondary | ICD-10-CM | POA: Diagnosis not present

## 2022-01-24 DIAGNOSIS — I359 Nonrheumatic aortic valve disorder, unspecified: Secondary | ICD-10-CM | POA: Diagnosis not present

## 2022-01-24 DIAGNOSIS — R634 Abnormal weight loss: Secondary | ICD-10-CM | POA: Diagnosis not present

## 2022-01-24 DIAGNOSIS — F419 Anxiety disorder, unspecified: Secondary | ICD-10-CM | POA: Diagnosis not present

## 2022-01-26 DIAGNOSIS — J449 Chronic obstructive pulmonary disease, unspecified: Secondary | ICD-10-CM | POA: Diagnosis not present

## 2022-01-26 DIAGNOSIS — J9691 Respiratory failure, unspecified with hypoxia: Secondary | ICD-10-CM | POA: Diagnosis not present

## 2022-01-26 DIAGNOSIS — R634 Abnormal weight loss: Secondary | ICD-10-CM | POA: Diagnosis not present

## 2022-01-26 DIAGNOSIS — I10 Essential (primary) hypertension: Secondary | ICD-10-CM | POA: Diagnosis not present

## 2022-01-26 DIAGNOSIS — I359 Nonrheumatic aortic valve disorder, unspecified: Secondary | ICD-10-CM | POA: Diagnosis not present

## 2022-01-26 DIAGNOSIS — F419 Anxiety disorder, unspecified: Secondary | ICD-10-CM | POA: Diagnosis not present

## 2022-01-29 DIAGNOSIS — J9691 Respiratory failure, unspecified with hypoxia: Secondary | ICD-10-CM | POA: Diagnosis not present

## 2022-01-29 DIAGNOSIS — J449 Chronic obstructive pulmonary disease, unspecified: Secondary | ICD-10-CM | POA: Diagnosis not present

## 2022-01-29 DIAGNOSIS — F419 Anxiety disorder, unspecified: Secondary | ICD-10-CM | POA: Diagnosis not present

## 2022-01-29 DIAGNOSIS — I359 Nonrheumatic aortic valve disorder, unspecified: Secondary | ICD-10-CM | POA: Diagnosis not present

## 2022-01-29 DIAGNOSIS — I10 Essential (primary) hypertension: Secondary | ICD-10-CM | POA: Diagnosis not present

## 2022-01-29 DIAGNOSIS — R634 Abnormal weight loss: Secondary | ICD-10-CM | POA: Diagnosis not present

## 2022-01-30 DIAGNOSIS — R634 Abnormal weight loss: Secondary | ICD-10-CM | POA: Diagnosis not present

## 2022-01-30 DIAGNOSIS — J9691 Respiratory failure, unspecified with hypoxia: Secondary | ICD-10-CM | POA: Diagnosis not present

## 2022-01-30 DIAGNOSIS — F419 Anxiety disorder, unspecified: Secondary | ICD-10-CM | POA: Diagnosis not present

## 2022-01-30 DIAGNOSIS — J449 Chronic obstructive pulmonary disease, unspecified: Secondary | ICD-10-CM | POA: Diagnosis not present

## 2022-01-30 DIAGNOSIS — I359 Nonrheumatic aortic valve disorder, unspecified: Secondary | ICD-10-CM | POA: Diagnosis not present

## 2022-01-30 DIAGNOSIS — I10 Essential (primary) hypertension: Secondary | ICD-10-CM | POA: Diagnosis not present

## 2022-01-31 DIAGNOSIS — I10 Essential (primary) hypertension: Secondary | ICD-10-CM | POA: Diagnosis not present

## 2022-01-31 DIAGNOSIS — I359 Nonrheumatic aortic valve disorder, unspecified: Secondary | ICD-10-CM | POA: Diagnosis not present

## 2022-01-31 DIAGNOSIS — F419 Anxiety disorder, unspecified: Secondary | ICD-10-CM | POA: Diagnosis not present

## 2022-01-31 DIAGNOSIS — J9691 Respiratory failure, unspecified with hypoxia: Secondary | ICD-10-CM | POA: Diagnosis not present

## 2022-01-31 DIAGNOSIS — R634 Abnormal weight loss: Secondary | ICD-10-CM | POA: Diagnosis not present

## 2022-01-31 DIAGNOSIS — J449 Chronic obstructive pulmonary disease, unspecified: Secondary | ICD-10-CM | POA: Diagnosis not present

## 2022-02-02 DIAGNOSIS — Z466 Encounter for fitting and adjustment of urinary device: Secondary | ICD-10-CM | POA: Diagnosis not present

## 2022-02-02 DIAGNOSIS — J449 Chronic obstructive pulmonary disease, unspecified: Secondary | ICD-10-CM | POA: Diagnosis not present

## 2022-02-02 DIAGNOSIS — R634 Abnormal weight loss: Secondary | ICD-10-CM | POA: Diagnosis not present

## 2022-02-02 DIAGNOSIS — R296 Repeated falls: Secondary | ICD-10-CM | POA: Diagnosis not present

## 2022-02-02 DIAGNOSIS — E43 Unspecified severe protein-calorie malnutrition: Secondary | ICD-10-CM | POA: Diagnosis not present

## 2022-02-02 DIAGNOSIS — I359 Nonrheumatic aortic valve disorder, unspecified: Secondary | ICD-10-CM | POA: Diagnosis not present

## 2022-02-02 DIAGNOSIS — R32 Unspecified urinary incontinence: Secondary | ICD-10-CM | POA: Diagnosis not present

## 2022-02-02 DIAGNOSIS — J9691 Respiratory failure, unspecified with hypoxia: Secondary | ICD-10-CM | POA: Diagnosis not present

## 2022-02-02 DIAGNOSIS — F419 Anxiety disorder, unspecified: Secondary | ICD-10-CM | POA: Diagnosis not present

## 2022-02-02 DIAGNOSIS — Z741 Need for assistance with personal care: Secondary | ICD-10-CM | POA: Diagnosis not present

## 2022-02-02 DIAGNOSIS — L89322 Pressure ulcer of left buttock, stage 2: Secondary | ICD-10-CM | POA: Diagnosis not present

## 2022-02-02 DIAGNOSIS — Z872 Personal history of diseases of the skin and subcutaneous tissue: Secondary | ICD-10-CM | POA: Diagnosis not present

## 2022-02-02 DIAGNOSIS — E890 Postprocedural hypothyroidism: Secondary | ICD-10-CM | POA: Diagnosis not present

## 2022-02-02 DIAGNOSIS — R159 Full incontinence of feces: Secondary | ICD-10-CM | POA: Diagnosis not present

## 2022-02-02 DIAGNOSIS — Z85850 Personal history of malignant neoplasm of thyroid: Secondary | ICD-10-CM | POA: Diagnosis not present

## 2022-02-02 DIAGNOSIS — Z6821 Body mass index (BMI) 21.0-21.9, adult: Secondary | ICD-10-CM | POA: Diagnosis not present

## 2022-02-02 DIAGNOSIS — R627 Adult failure to thrive: Secondary | ICD-10-CM | POA: Diagnosis not present

## 2022-02-02 DIAGNOSIS — I10 Essential (primary) hypertension: Secondary | ICD-10-CM | POA: Diagnosis not present

## 2022-02-02 DIAGNOSIS — L89153 Pressure ulcer of sacral region, stage 3: Secondary | ICD-10-CM | POA: Diagnosis not present

## 2022-02-02 DIAGNOSIS — Z9981 Dependence on supplemental oxygen: Secondary | ICD-10-CM | POA: Diagnosis not present

## 2022-02-02 DIAGNOSIS — F05 Delirium due to known physiological condition: Secondary | ICD-10-CM | POA: Diagnosis not present

## 2022-02-02 DIAGNOSIS — L89101 Pressure ulcer of unspecified part of back, stage 1: Secondary | ICD-10-CM | POA: Diagnosis not present

## 2022-02-02 DIAGNOSIS — M1990 Unspecified osteoarthritis, unspecified site: Secondary | ICD-10-CM | POA: Diagnosis not present

## 2022-02-03 DIAGNOSIS — I359 Nonrheumatic aortic valve disorder, unspecified: Secondary | ICD-10-CM | POA: Diagnosis not present

## 2022-02-03 DIAGNOSIS — J449 Chronic obstructive pulmonary disease, unspecified: Secondary | ICD-10-CM | POA: Diagnosis not present

## 2022-02-03 DIAGNOSIS — J9691 Respiratory failure, unspecified with hypoxia: Secondary | ICD-10-CM | POA: Diagnosis not present

## 2022-02-03 DIAGNOSIS — R634 Abnormal weight loss: Secondary | ICD-10-CM | POA: Diagnosis not present

## 2022-02-03 DIAGNOSIS — I10 Essential (primary) hypertension: Secondary | ICD-10-CM | POA: Diagnosis not present

## 2022-02-03 DIAGNOSIS — F419 Anxiety disorder, unspecified: Secondary | ICD-10-CM | POA: Diagnosis not present

## 2022-02-05 DIAGNOSIS — F419 Anxiety disorder, unspecified: Secondary | ICD-10-CM | POA: Diagnosis not present

## 2022-02-05 DIAGNOSIS — R634 Abnormal weight loss: Secondary | ICD-10-CM | POA: Diagnosis not present

## 2022-02-05 DIAGNOSIS — I10 Essential (primary) hypertension: Secondary | ICD-10-CM | POA: Diagnosis not present

## 2022-02-05 DIAGNOSIS — I359 Nonrheumatic aortic valve disorder, unspecified: Secondary | ICD-10-CM | POA: Diagnosis not present

## 2022-02-05 DIAGNOSIS — J449 Chronic obstructive pulmonary disease, unspecified: Secondary | ICD-10-CM | POA: Diagnosis not present

## 2022-02-05 DIAGNOSIS — J9691 Respiratory failure, unspecified with hypoxia: Secondary | ICD-10-CM | POA: Diagnosis not present

## 2022-02-06 DIAGNOSIS — I359 Nonrheumatic aortic valve disorder, unspecified: Secondary | ICD-10-CM | POA: Diagnosis not present

## 2022-02-06 DIAGNOSIS — F419 Anxiety disorder, unspecified: Secondary | ICD-10-CM | POA: Diagnosis not present

## 2022-02-06 DIAGNOSIS — R634 Abnormal weight loss: Secondary | ICD-10-CM | POA: Diagnosis not present

## 2022-02-06 DIAGNOSIS — J9691 Respiratory failure, unspecified with hypoxia: Secondary | ICD-10-CM | POA: Diagnosis not present

## 2022-02-06 DIAGNOSIS — I10 Essential (primary) hypertension: Secondary | ICD-10-CM | POA: Diagnosis not present

## 2022-02-06 DIAGNOSIS — J449 Chronic obstructive pulmonary disease, unspecified: Secondary | ICD-10-CM | POA: Diagnosis not present

## 2022-02-07 DIAGNOSIS — F419 Anxiety disorder, unspecified: Secondary | ICD-10-CM | POA: Diagnosis not present

## 2022-02-07 DIAGNOSIS — I10 Essential (primary) hypertension: Secondary | ICD-10-CM | POA: Diagnosis not present

## 2022-02-07 DIAGNOSIS — R634 Abnormal weight loss: Secondary | ICD-10-CM | POA: Diagnosis not present

## 2022-02-07 DIAGNOSIS — I359 Nonrheumatic aortic valve disorder, unspecified: Secondary | ICD-10-CM | POA: Diagnosis not present

## 2022-02-07 DIAGNOSIS — J9691 Respiratory failure, unspecified with hypoxia: Secondary | ICD-10-CM | POA: Diagnosis not present

## 2022-02-07 DIAGNOSIS — J449 Chronic obstructive pulmonary disease, unspecified: Secondary | ICD-10-CM | POA: Diagnosis not present

## 2022-02-08 ENCOUNTER — Other Ambulatory Visit: Payer: Self-pay | Admitting: Family Medicine

## 2022-02-08 DIAGNOSIS — E890 Postprocedural hypothyroidism: Secondary | ICD-10-CM

## 2022-02-09 DIAGNOSIS — J449 Chronic obstructive pulmonary disease, unspecified: Secondary | ICD-10-CM | POA: Diagnosis not present

## 2022-02-09 DIAGNOSIS — J9691 Respiratory failure, unspecified with hypoxia: Secondary | ICD-10-CM | POA: Diagnosis not present

## 2022-02-09 DIAGNOSIS — I10 Essential (primary) hypertension: Secondary | ICD-10-CM | POA: Diagnosis not present

## 2022-02-09 DIAGNOSIS — F419 Anxiety disorder, unspecified: Secondary | ICD-10-CM | POA: Diagnosis not present

## 2022-02-09 DIAGNOSIS — R634 Abnormal weight loss: Secondary | ICD-10-CM | POA: Diagnosis not present

## 2022-02-09 DIAGNOSIS — I359 Nonrheumatic aortic valve disorder, unspecified: Secondary | ICD-10-CM | POA: Diagnosis not present

## 2022-02-11 DIAGNOSIS — I359 Nonrheumatic aortic valve disorder, unspecified: Secondary | ICD-10-CM | POA: Diagnosis not present

## 2022-02-11 DIAGNOSIS — J9691 Respiratory failure, unspecified with hypoxia: Secondary | ICD-10-CM | POA: Diagnosis not present

## 2022-02-11 DIAGNOSIS — R634 Abnormal weight loss: Secondary | ICD-10-CM | POA: Diagnosis not present

## 2022-02-11 DIAGNOSIS — J449 Chronic obstructive pulmonary disease, unspecified: Secondary | ICD-10-CM | POA: Diagnosis not present

## 2022-02-11 DIAGNOSIS — I10 Essential (primary) hypertension: Secondary | ICD-10-CM | POA: Diagnosis not present

## 2022-02-11 DIAGNOSIS — F419 Anxiety disorder, unspecified: Secondary | ICD-10-CM | POA: Diagnosis not present

## 2022-02-11 NOTE — Telephone Encounter (Signed)
Provider aware of TSH lab results. ? ?Requested Prescriptions  ?Pending Prescriptions Disp Refills  ?? levothyroxine (SYNTHROID) 125 MCG tablet [Pharmacy Med Name: LEVOTHYROXINE 0.'125MG'$  (125MCG) TAB] 90 tablet 2  ?  Sig: TAKE 1 TABLET BY MOUTH BEFORE BREAKFAST  ?  ? Endocrinology:  Hypothyroid Agents Failed - 02/08/2022  1:00 PM  ?  ?  Failed - TSH in normal range and within 360 days  ?  TSH  ?Date Value Ref Range Status  ?05/09/2021 13.800 (H) 0.450 - 4.500 uIU/mL Final  ?   ?  ?  Passed - Valid encounter within last 12 months  ?  Recent Outpatient Visits   ?      ? 9 months ago Essential hypertension  ? [facility] Jerrol Banana., MD  ? 1 year ago Essential hypertension  ? [facility] Jerrol Banana., MD  ? 1 year ago Bronchiectasis without acute exacerbation [facility])  ? [facility] Jerrol Banana., MD  ? 1 year ago Generalized anxiety disorder  ? [facility] Jerrol Banana., MD  ? 1 year ago Generalized anxiety disorder  ? [facility] Jerrol Banana., MD  ?  ?  ? ?  ?  ?  ? ? ?

## 2022-02-12 DIAGNOSIS — R634 Abnormal weight loss: Secondary | ICD-10-CM | POA: Diagnosis not present

## 2022-02-12 DIAGNOSIS — J9691 Respiratory failure, unspecified with hypoxia: Secondary | ICD-10-CM | POA: Diagnosis not present

## 2022-02-12 DIAGNOSIS — J449 Chronic obstructive pulmonary disease, unspecified: Secondary | ICD-10-CM | POA: Diagnosis not present

## 2022-02-12 DIAGNOSIS — I359 Nonrheumatic aortic valve disorder, unspecified: Secondary | ICD-10-CM | POA: Diagnosis not present

## 2022-02-12 DIAGNOSIS — F419 Anxiety disorder, unspecified: Secondary | ICD-10-CM | POA: Diagnosis not present

## 2022-02-12 DIAGNOSIS — I10 Essential (primary) hypertension: Secondary | ICD-10-CM | POA: Diagnosis not present

## 2022-02-14 DIAGNOSIS — I359 Nonrheumatic aortic valve disorder, unspecified: Secondary | ICD-10-CM | POA: Diagnosis not present

## 2022-02-14 DIAGNOSIS — I10 Essential (primary) hypertension: Secondary | ICD-10-CM | POA: Diagnosis not present

## 2022-02-14 DIAGNOSIS — J449 Chronic obstructive pulmonary disease, unspecified: Secondary | ICD-10-CM | POA: Diagnosis not present

## 2022-02-14 DIAGNOSIS — J9691 Respiratory failure, unspecified with hypoxia: Secondary | ICD-10-CM | POA: Diagnosis not present

## 2022-02-14 DIAGNOSIS — R634 Abnormal weight loss: Secondary | ICD-10-CM | POA: Diagnosis not present

## 2022-02-14 DIAGNOSIS — F419 Anxiety disorder, unspecified: Secondary | ICD-10-CM | POA: Diagnosis not present

## 2022-02-17 DIAGNOSIS — F419 Anxiety disorder, unspecified: Secondary | ICD-10-CM | POA: Diagnosis not present

## 2022-02-17 DIAGNOSIS — I359 Nonrheumatic aortic valve disorder, unspecified: Secondary | ICD-10-CM | POA: Diagnosis not present

## 2022-02-17 DIAGNOSIS — J9691 Respiratory failure, unspecified with hypoxia: Secondary | ICD-10-CM | POA: Diagnosis not present

## 2022-02-17 DIAGNOSIS — I10 Essential (primary) hypertension: Secondary | ICD-10-CM | POA: Diagnosis not present

## 2022-02-17 DIAGNOSIS — J449 Chronic obstructive pulmonary disease, unspecified: Secondary | ICD-10-CM | POA: Diagnosis not present

## 2022-02-17 DIAGNOSIS — R634 Abnormal weight loss: Secondary | ICD-10-CM | POA: Diagnosis not present

## 2022-02-18 DIAGNOSIS — I10 Essential (primary) hypertension: Secondary | ICD-10-CM | POA: Diagnosis not present

## 2022-02-18 DIAGNOSIS — J9691 Respiratory failure, unspecified with hypoxia: Secondary | ICD-10-CM | POA: Diagnosis not present

## 2022-02-18 DIAGNOSIS — I359 Nonrheumatic aortic valve disorder, unspecified: Secondary | ICD-10-CM | POA: Diagnosis not present

## 2022-02-18 DIAGNOSIS — R634 Abnormal weight loss: Secondary | ICD-10-CM | POA: Diagnosis not present

## 2022-02-18 DIAGNOSIS — F419 Anxiety disorder, unspecified: Secondary | ICD-10-CM | POA: Diagnosis not present

## 2022-02-18 DIAGNOSIS — J449 Chronic obstructive pulmonary disease, unspecified: Secondary | ICD-10-CM | POA: Diagnosis not present

## 2022-02-19 DIAGNOSIS — J9691 Respiratory failure, unspecified with hypoxia: Secondary | ICD-10-CM | POA: Diagnosis not present

## 2022-02-19 DIAGNOSIS — I10 Essential (primary) hypertension: Secondary | ICD-10-CM | POA: Diagnosis not present

## 2022-02-19 DIAGNOSIS — F419 Anxiety disorder, unspecified: Secondary | ICD-10-CM | POA: Diagnosis not present

## 2022-02-19 DIAGNOSIS — I359 Nonrheumatic aortic valve disorder, unspecified: Secondary | ICD-10-CM | POA: Diagnosis not present

## 2022-02-19 DIAGNOSIS — J449 Chronic obstructive pulmonary disease, unspecified: Secondary | ICD-10-CM | POA: Diagnosis not present

## 2022-02-19 DIAGNOSIS — R634 Abnormal weight loss: Secondary | ICD-10-CM | POA: Diagnosis not present

## 2022-02-20 DIAGNOSIS — R634 Abnormal weight loss: Secondary | ICD-10-CM | POA: Diagnosis not present

## 2022-02-20 DIAGNOSIS — I10 Essential (primary) hypertension: Secondary | ICD-10-CM | POA: Diagnosis not present

## 2022-02-20 DIAGNOSIS — J9691 Respiratory failure, unspecified with hypoxia: Secondary | ICD-10-CM | POA: Diagnosis not present

## 2022-02-20 DIAGNOSIS — F419 Anxiety disorder, unspecified: Secondary | ICD-10-CM | POA: Diagnosis not present

## 2022-02-20 DIAGNOSIS — J449 Chronic obstructive pulmonary disease, unspecified: Secondary | ICD-10-CM | POA: Diagnosis not present

## 2022-02-20 DIAGNOSIS — I359 Nonrheumatic aortic valve disorder, unspecified: Secondary | ICD-10-CM | POA: Diagnosis not present

## 2022-02-21 DIAGNOSIS — R634 Abnormal weight loss: Secondary | ICD-10-CM | POA: Diagnosis not present

## 2022-02-21 DIAGNOSIS — F419 Anxiety disorder, unspecified: Secondary | ICD-10-CM | POA: Diagnosis not present

## 2022-02-21 DIAGNOSIS — I10 Essential (primary) hypertension: Secondary | ICD-10-CM | POA: Diagnosis not present

## 2022-02-21 DIAGNOSIS — I359 Nonrheumatic aortic valve disorder, unspecified: Secondary | ICD-10-CM | POA: Diagnosis not present

## 2022-02-21 DIAGNOSIS — J449 Chronic obstructive pulmonary disease, unspecified: Secondary | ICD-10-CM | POA: Diagnosis not present

## 2022-02-21 DIAGNOSIS — J9691 Respiratory failure, unspecified with hypoxia: Secondary | ICD-10-CM | POA: Diagnosis not present

## 2022-02-24 DIAGNOSIS — J9691 Respiratory failure, unspecified with hypoxia: Secondary | ICD-10-CM | POA: Diagnosis not present

## 2022-02-24 DIAGNOSIS — I359 Nonrheumatic aortic valve disorder, unspecified: Secondary | ICD-10-CM | POA: Diagnosis not present

## 2022-02-24 DIAGNOSIS — I10 Essential (primary) hypertension: Secondary | ICD-10-CM | POA: Diagnosis not present

## 2022-02-24 DIAGNOSIS — R634 Abnormal weight loss: Secondary | ICD-10-CM | POA: Diagnosis not present

## 2022-02-24 DIAGNOSIS — J449 Chronic obstructive pulmonary disease, unspecified: Secondary | ICD-10-CM | POA: Diagnosis not present

## 2022-02-24 DIAGNOSIS — F419 Anxiety disorder, unspecified: Secondary | ICD-10-CM | POA: Diagnosis not present

## 2022-02-26 DIAGNOSIS — J9691 Respiratory failure, unspecified with hypoxia: Secondary | ICD-10-CM | POA: Diagnosis not present

## 2022-02-26 DIAGNOSIS — F419 Anxiety disorder, unspecified: Secondary | ICD-10-CM | POA: Diagnosis not present

## 2022-02-26 DIAGNOSIS — I10 Essential (primary) hypertension: Secondary | ICD-10-CM | POA: Diagnosis not present

## 2022-02-26 DIAGNOSIS — R634 Abnormal weight loss: Secondary | ICD-10-CM | POA: Diagnosis not present

## 2022-02-26 DIAGNOSIS — I359 Nonrheumatic aortic valve disorder, unspecified: Secondary | ICD-10-CM | POA: Diagnosis not present

## 2022-02-26 DIAGNOSIS — J449 Chronic obstructive pulmonary disease, unspecified: Secondary | ICD-10-CM | POA: Diagnosis not present

## 2022-02-28 DIAGNOSIS — J449 Chronic obstructive pulmonary disease, unspecified: Secondary | ICD-10-CM | POA: Diagnosis not present

## 2022-02-28 DIAGNOSIS — I359 Nonrheumatic aortic valve disorder, unspecified: Secondary | ICD-10-CM | POA: Diagnosis not present

## 2022-02-28 DIAGNOSIS — F419 Anxiety disorder, unspecified: Secondary | ICD-10-CM | POA: Diagnosis not present

## 2022-02-28 DIAGNOSIS — I10 Essential (primary) hypertension: Secondary | ICD-10-CM | POA: Diagnosis not present

## 2022-02-28 DIAGNOSIS — R634 Abnormal weight loss: Secondary | ICD-10-CM | POA: Diagnosis not present

## 2022-02-28 DIAGNOSIS — J9691 Respiratory failure, unspecified with hypoxia: Secondary | ICD-10-CM | POA: Diagnosis not present

## 2022-03-03 DIAGNOSIS — I359 Nonrheumatic aortic valve disorder, unspecified: Secondary | ICD-10-CM | POA: Diagnosis not present

## 2022-03-03 DIAGNOSIS — J9691 Respiratory failure, unspecified with hypoxia: Secondary | ICD-10-CM | POA: Diagnosis not present

## 2022-03-03 DIAGNOSIS — J449 Chronic obstructive pulmonary disease, unspecified: Secondary | ICD-10-CM | POA: Diagnosis not present

## 2022-03-03 DIAGNOSIS — R634 Abnormal weight loss: Secondary | ICD-10-CM | POA: Diagnosis not present

## 2022-03-03 DIAGNOSIS — I10 Essential (primary) hypertension: Secondary | ICD-10-CM | POA: Diagnosis not present

## 2022-03-03 DIAGNOSIS — F419 Anxiety disorder, unspecified: Secondary | ICD-10-CM | POA: Diagnosis not present

## 2022-03-04 DIAGNOSIS — M1990 Unspecified osteoarthritis, unspecified site: Secondary | ICD-10-CM | POA: Diagnosis not present

## 2022-03-04 DIAGNOSIS — R159 Full incontinence of feces: Secondary | ICD-10-CM | POA: Diagnosis not present

## 2022-03-04 DIAGNOSIS — Z466 Encounter for fitting and adjustment of urinary device: Secondary | ICD-10-CM | POA: Diagnosis not present

## 2022-03-04 DIAGNOSIS — F05 Delirium due to known physiological condition: Secondary | ICD-10-CM | POA: Diagnosis not present

## 2022-03-04 DIAGNOSIS — J449 Chronic obstructive pulmonary disease, unspecified: Secondary | ICD-10-CM | POA: Diagnosis not present

## 2022-03-04 DIAGNOSIS — Z6821 Body mass index (BMI) 21.0-21.9, adult: Secondary | ICD-10-CM | POA: Diagnosis not present

## 2022-03-04 DIAGNOSIS — J9691 Respiratory failure, unspecified with hypoxia: Secondary | ICD-10-CM | POA: Diagnosis not present

## 2022-03-04 DIAGNOSIS — L89322 Pressure ulcer of left buttock, stage 2: Secondary | ICD-10-CM | POA: Diagnosis not present

## 2022-03-04 DIAGNOSIS — I10 Essential (primary) hypertension: Secondary | ICD-10-CM | POA: Diagnosis not present

## 2022-03-04 DIAGNOSIS — E43 Unspecified severe protein-calorie malnutrition: Secondary | ICD-10-CM | POA: Diagnosis not present

## 2022-03-04 DIAGNOSIS — Z741 Need for assistance with personal care: Secondary | ICD-10-CM | POA: Diagnosis not present

## 2022-03-04 DIAGNOSIS — R627 Adult failure to thrive: Secondary | ICD-10-CM | POA: Diagnosis not present

## 2022-03-04 DIAGNOSIS — Z9981 Dependence on supplemental oxygen: Secondary | ICD-10-CM | POA: Diagnosis not present

## 2022-03-04 DIAGNOSIS — R634 Abnormal weight loss: Secondary | ICD-10-CM | POA: Diagnosis not present

## 2022-03-04 DIAGNOSIS — I359 Nonrheumatic aortic valve disorder, unspecified: Secondary | ICD-10-CM | POA: Diagnosis not present

## 2022-03-04 DIAGNOSIS — R296 Repeated falls: Secondary | ICD-10-CM | POA: Diagnosis not present

## 2022-03-04 DIAGNOSIS — L89101 Pressure ulcer of unspecified part of back, stage 1: Secondary | ICD-10-CM | POA: Diagnosis not present

## 2022-03-04 DIAGNOSIS — Z85850 Personal history of malignant neoplasm of thyroid: Secondary | ICD-10-CM | POA: Diagnosis not present

## 2022-03-04 DIAGNOSIS — F419 Anxiety disorder, unspecified: Secondary | ICD-10-CM | POA: Diagnosis not present

## 2022-03-04 DIAGNOSIS — R32 Unspecified urinary incontinence: Secondary | ICD-10-CM | POA: Diagnosis not present

## 2022-03-04 DIAGNOSIS — E890 Postprocedural hypothyroidism: Secondary | ICD-10-CM | POA: Diagnosis not present

## 2022-03-04 DIAGNOSIS — L89153 Pressure ulcer of sacral region, stage 3: Secondary | ICD-10-CM | POA: Diagnosis not present

## 2022-03-04 DIAGNOSIS — Z872 Personal history of diseases of the skin and subcutaneous tissue: Secondary | ICD-10-CM | POA: Diagnosis not present

## 2022-03-06 DIAGNOSIS — Z20822 Contact with and (suspected) exposure to covid-19: Secondary | ICD-10-CM | POA: Diagnosis not present

## 2022-03-07 DIAGNOSIS — J449 Chronic obstructive pulmonary disease, unspecified: Secondary | ICD-10-CM | POA: Diagnosis not present

## 2022-03-07 DIAGNOSIS — I10 Essential (primary) hypertension: Secondary | ICD-10-CM | POA: Diagnosis not present

## 2022-03-07 DIAGNOSIS — J9691 Respiratory failure, unspecified with hypoxia: Secondary | ICD-10-CM | POA: Diagnosis not present

## 2022-03-07 DIAGNOSIS — F419 Anxiety disorder, unspecified: Secondary | ICD-10-CM | POA: Diagnosis not present

## 2022-03-07 DIAGNOSIS — I359 Nonrheumatic aortic valve disorder, unspecified: Secondary | ICD-10-CM | POA: Diagnosis not present

## 2022-03-07 DIAGNOSIS — R634 Abnormal weight loss: Secondary | ICD-10-CM | POA: Diagnosis not present

## 2022-03-10 DIAGNOSIS — F419 Anxiety disorder, unspecified: Secondary | ICD-10-CM | POA: Diagnosis not present

## 2022-03-10 DIAGNOSIS — I10 Essential (primary) hypertension: Secondary | ICD-10-CM | POA: Diagnosis not present

## 2022-03-10 DIAGNOSIS — I359 Nonrheumatic aortic valve disorder, unspecified: Secondary | ICD-10-CM | POA: Diagnosis not present

## 2022-03-10 DIAGNOSIS — R634 Abnormal weight loss: Secondary | ICD-10-CM | POA: Diagnosis not present

## 2022-03-10 DIAGNOSIS — J9691 Respiratory failure, unspecified with hypoxia: Secondary | ICD-10-CM | POA: Diagnosis not present

## 2022-03-10 DIAGNOSIS — J449 Chronic obstructive pulmonary disease, unspecified: Secondary | ICD-10-CM | POA: Diagnosis not present

## 2022-03-12 DIAGNOSIS — J449 Chronic obstructive pulmonary disease, unspecified: Secondary | ICD-10-CM | POA: Diagnosis not present

## 2022-03-12 DIAGNOSIS — F419 Anxiety disorder, unspecified: Secondary | ICD-10-CM | POA: Diagnosis not present

## 2022-03-12 DIAGNOSIS — I359 Nonrheumatic aortic valve disorder, unspecified: Secondary | ICD-10-CM | POA: Diagnosis not present

## 2022-03-12 DIAGNOSIS — I10 Essential (primary) hypertension: Secondary | ICD-10-CM | POA: Diagnosis not present

## 2022-03-12 DIAGNOSIS — J9691 Respiratory failure, unspecified with hypoxia: Secondary | ICD-10-CM | POA: Diagnosis not present

## 2022-03-12 DIAGNOSIS — R634 Abnormal weight loss: Secondary | ICD-10-CM | POA: Diagnosis not present

## 2022-04-04 DEATH — deceased
